# Patient Record
Sex: Male | Born: 1950 | Race: Black or African American | Hispanic: No | Marital: Married | Smoking: Never smoker
Health system: Southern US, Community
[De-identification: ages and names within clinical notes are randomized; demographics above are authoritative.]

## PROBLEM LIST (undated history)

## (undated) DIAGNOSIS — H269 Unspecified cataract: Secondary | ICD-10-CM

## (undated) DIAGNOSIS — E785 Hyperlipidemia, unspecified: Secondary | ICD-10-CM

## (undated) DIAGNOSIS — I428 Other cardiomyopathies: Secondary | ICD-10-CM

## (undated) DIAGNOSIS — T7840XA Allergy, unspecified, initial encounter: Secondary | ICD-10-CM

## (undated) DIAGNOSIS — I2510 Atherosclerotic heart disease of native coronary artery without angina pectoris: Secondary | ICD-10-CM

## (undated) DIAGNOSIS — I480 Paroxysmal atrial fibrillation: Secondary | ICD-10-CM

## (undated) DIAGNOSIS — R001 Bradycardia, unspecified: Secondary | ICD-10-CM

## (undated) DIAGNOSIS — I10 Essential (primary) hypertension: Secondary | ICD-10-CM

## (undated) DIAGNOSIS — I639 Cerebral infarction, unspecified: Secondary | ICD-10-CM

## (undated) DIAGNOSIS — R42 Dizziness and giddiness: Secondary | ICD-10-CM

## (undated) HISTORY — DX: Bradycardia, unspecified: R00.1

## (undated) HISTORY — DX: Other cardiomyopathies: I42.8

## (undated) HISTORY — DX: Allergy, unspecified, initial encounter: T78.40XA

## (undated) HISTORY — DX: Atherosclerotic heart disease of native coronary artery without angina pectoris: I25.10

## (undated) HISTORY — PX: ABDOMINAL SURGERY: SHX537

## (undated) HISTORY — PX: HERNIA REPAIR: SHX51

## (undated) HISTORY — PX: APPENDECTOMY: SHX54

## (undated) HISTORY — DX: Unspecified cataract: H26.9

## (undated) HISTORY — PX: EYE SURGERY: SHX253

## (undated) HISTORY — DX: Paroxysmal atrial fibrillation: I48.0

---

## 2003-06-09 ENCOUNTER — Emergency Department (HOSPITAL_COMMUNITY): Admission: EM | Admit: 2003-06-09 | Discharge: 2003-06-09 | Payer: Self-pay | Admitting: Emergency Medicine

## 2004-06-16 ENCOUNTER — Emergency Department (HOSPITAL_COMMUNITY): Admission: EM | Admit: 2004-06-16 | Discharge: 2004-06-16 | Payer: Self-pay | Admitting: Emergency Medicine

## 2005-05-04 ENCOUNTER — Emergency Department (HOSPITAL_COMMUNITY): Admission: EM | Admit: 2005-05-04 | Discharge: 2005-05-04 | Payer: Self-pay | Admitting: Emergency Medicine

## 2007-01-10 ENCOUNTER — Emergency Department (HOSPITAL_COMMUNITY): Admission: EM | Admit: 2007-01-10 | Discharge: 2007-01-10 | Payer: Self-pay | Admitting: Emergency Medicine

## 2007-08-26 ENCOUNTER — Ambulatory Visit: Payer: Self-pay | Admitting: Hematology & Oncology

## 2007-09-09 LAB — CBC WITH DIFFERENTIAL (CANCER CENTER ONLY)
BASO%: 0.6 % (ref 0.0–2.0)
EOS%: 3.7 % (ref 0.0–7.0)
Eosinophils Absolute: 0.1 10*3/uL (ref 0.0–0.5)
LYMPH%: 73 % — ABNORMAL HIGH (ref 14.0–48.0)
MCH: 31.3 pg (ref 28.0–33.4)
MCHC: 35.1 g/dL (ref 32.0–35.9)
MCV: 89 fL (ref 82–98)
MONO%: 6.1 % (ref 0.0–13.0)
NEUT#: 0.5 10*3/uL — ABNORMAL LOW (ref 1.5–6.5)
Platelets: 196 10*3/uL (ref 145–400)
RBC: 4.55 10*6/uL (ref 4.20–5.70)
RDW: 11.6 % (ref 10.5–14.6)

## 2007-09-09 LAB — CHCC SATELLITE - SMEAR

## 2007-09-10 LAB — IGG, IGA, IGM
IgA: 425 mg/dL — ABNORMAL HIGH (ref 68–378)
IgG (Immunoglobin G), Serum: 1950 mg/dL — ABNORMAL HIGH (ref 694–1618)

## 2007-09-11 LAB — RHEUMATOID FACTOR: Rhuematoid fact SerPl-aCnc: <20 IU/mL (ref 0–20)

## 2007-09-11 LAB — PROTEIN ELECTROPHORESIS, SERUM
Beta 2: 5.3 % (ref 3.2–6.5)
Gamma Globulin: 23.9 % — ABNORMAL HIGH (ref 11.1–18.8)

## 2007-09-11 LAB — VITAMIN B12: Vitamin B-12: 956 pg/mL — ABNORMAL HIGH (ref 211–911)

## 2007-09-27 ENCOUNTER — Encounter: Payer: Self-pay | Admitting: Emergency Medicine

## 2007-09-28 ENCOUNTER — Inpatient Hospital Stay (HOSPITAL_COMMUNITY): Admission: EM | Admit: 2007-09-28 | Discharge: 2007-10-01 | Payer: Self-pay | Admitting: Internal Medicine

## 2007-09-28 ENCOUNTER — Ambulatory Visit: Payer: Self-pay | Admitting: Cardiology

## 2007-09-30 ENCOUNTER — Encounter (INDEPENDENT_AMBULATORY_CARE_PROVIDER_SITE_OTHER): Payer: Self-pay | Admitting: Family Medicine

## 2007-10-01 ENCOUNTER — Encounter: Payer: Self-pay | Admitting: Cardiology

## 2007-10-15 ENCOUNTER — Ambulatory Visit: Payer: Self-pay | Admitting: Hematology & Oncology

## 2008-02-21 ENCOUNTER — Emergency Department (HOSPITAL_BASED_OUTPATIENT_CLINIC_OR_DEPARTMENT_OTHER): Admission: EM | Admit: 2008-02-21 | Discharge: 2008-02-21 | Payer: Self-pay | Admitting: Emergency Medicine

## 2008-02-21 ENCOUNTER — Ambulatory Visit: Payer: Self-pay | Admitting: Radiology

## 2009-10-13 ENCOUNTER — Ambulatory Visit: Payer: Self-pay | Admitting: Diagnostic Radiology

## 2009-10-13 ENCOUNTER — Encounter: Payer: Self-pay | Admitting: Emergency Medicine

## 2009-10-13 ENCOUNTER — Observation Stay (HOSPITAL_COMMUNITY): Admission: AD | Admit: 2009-10-13 | Discharge: 2009-10-14 | Payer: Self-pay | Admitting: Internal Medicine

## 2009-10-13 ENCOUNTER — Ambulatory Visit: Payer: Self-pay | Admitting: Cardiovascular Disease

## 2009-10-13 ENCOUNTER — Encounter (INDEPENDENT_AMBULATORY_CARE_PROVIDER_SITE_OTHER): Payer: Self-pay | Admitting: Internal Medicine

## 2010-03-24 LAB — DIFFERENTIAL
Basophils Absolute: 0 10*3/uL (ref 0.0–0.1)
Basophils Relative: 0 % (ref 0–1)
Eosinophils Absolute: 0.1 10*3/uL (ref 0.0–0.7)
Eosinophils Absolute: 0.1 10*3/uL (ref 0.0–0.7)
Lymphocytes Relative: 70 % — ABNORMAL HIGH (ref 12–46)
Monocytes Absolute: 0.2 10*3/uL (ref 0.1–1.0)
Neutro Abs: 0.9 10*3/uL — ABNORMAL LOW (ref 1.7–7.7)
Neutrophils Relative %: 15 % — ABNORMAL LOW (ref 43–77)
Neutrophils Relative %: 25 % — ABNORMAL LOW (ref 43–77)

## 2010-03-24 LAB — CBC
HCT: 42.6 % (ref 39.0–52.0)
Hemoglobin: 14.5 g/dL (ref 13.0–17.0)
MCHC: 32.6 g/dL (ref 30.0–36.0)
MCHC: 34.1 g/dL (ref 30.0–36.0)
MCV: 92.9 fL (ref 78.0–100.0)
Platelets: 179 10*3/uL (ref 150–400)
RDW: 12.8 % (ref 11.5–15.5)
RDW: 13.6 % (ref 11.5–15.5)

## 2010-03-24 LAB — CARDIAC PANEL(CRET KIN+CKTOT+MB+TROPI)
CK, MB: 1.8 ng/mL (ref 0.3–4.0)
Relative Index: 1.4 (ref 0.0–2.5)
Relative Index: 1.4 (ref 0.0–2.5)
Relative Index: 1.6 (ref 0.0–2.5)
Total CK: 133 U/L (ref 7–232)
Troponin I: 0.02 ng/mL (ref 0.00–0.06)
Troponin I: 0.03 ng/mL (ref 0.00–0.06)
Troponin I: 0.03 ng/mL (ref 0.00–0.06)

## 2010-03-24 LAB — COMPREHENSIVE METABOLIC PANEL
Albumin: 4.2 g/dL (ref 3.5–5.2)
Alkaline Phosphatase: 73 U/L (ref 39–117)
BUN: 10 mg/dL (ref 6–23)
CO2: 27 mEq/L (ref 19–32)
CO2: 29 mEq/L (ref 19–32)
Calcium: 9.6 mg/dL (ref 8.4–10.5)
Chloride: 101 mEq/L (ref 96–112)
Creatinine, Ser: 0.91 mg/dL (ref 0.4–1.5)
GFR calc Af Amer: >60 mL/min (ref >60)
GFR calc non Af Amer: >60 mL/min (ref >60)
GFR calc non Af Amer: >60 mL/min (ref >60)
Sodium: 141 mEq/L (ref 135–145)
Total Bilirubin: 1.1 mg/dL (ref 0.3–1.2)
Total Bilirubin: 1.4 mg/dL — ABNORMAL HIGH (ref 0.3–1.2)
Total Protein: 8.2 g/dL (ref 6.0–8.3)

## 2010-03-24 LAB — T4, FREE: Free T4: 1.01 ng/dL (ref 0.80–1.80)

## 2010-03-24 LAB — POCT CARDIAC MARKERS: Myoglobin, poc: 63.9 ng/mL (ref 12–200)

## 2010-03-24 LAB — D-DIMER, QUANTITATIVE: D-Dimer, Quant: 0.31 ug/mL-FEU (ref 0.00–0.48)

## 2010-05-24 NOTE — Discharge Summary (Signed)
Phillip Booth, Phillip Booth               ACCOUNT NO.:  [id number]   MEDICAL RECORD NO.:  [id number]          PATIENT TYPE:  INP   LOCATION:  3014                         FACILITY:  [facility]   PHYSICIAN:  Hillery Aldo, M.D.   DATE OF BIRTH:  [birth year]   DATE OF ADMISSION:  09/28/2007  DATE OF DISCHARGE:  10/01/2007                               DISCHARGE SUMMARY   PRIMARY CARE PHYSICIAN:  Dr. Maye Hides at [facility]   NEUROLOGIST:  Pramod P. Pearlean Brownie, MD   DISCHARGE DIAGNOSES:  1. 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  2. Sinus bradycardia.  3. Neutropenia.  4. Hypertension.  5. Hyperlipidemia.  6. Chronic sinusitis.   DISCHARGE MEDICATIONS:  1. Norvasc 10 mg daily.  2. Aspirin 325 mg daily.  3. Simvastatin 40 mg daily.  4. Hydrochlorothiazide 25 mg daily.   CONSULTATIONS:  1. Noel Christmas, MD of Neurology.  2. Marca Ancona, MD of [facility].   BRIEF ADMISSION HISTORY OF PRESENT ILLNESS:  The patient is a 60-year-  old male who presented with a chief complaint of left-sided weakness  persistent since the early morning hours.  He had a similar episode 3  days prior to presentation that resolved without any permanent sequelae  because of concerns for stroke, the patient was admitted for full stroke  evaluation.  For the full details, please see the dictated report done  by Dr. Flonnie Overman.   PROCEDURES AND DIAGNOSTIC STUDIES:  1. CT scan of the head on September 27, 2007, showed no acute      intracranial abnormality with chronic sinusitis.  2. MRI of the brain on September 29, 2007, showed acute/subacute      infarct involving the posterior limb of the right internal capsule      and right thalamus.  There were scattered periventricular      subcortical T2 hyperintensities rather bilaterally.  These were      nonspecific, but likely reflective of the sequelae of chronic      microvascular ischemia.  3. MRA of the head on September 29, 2007,  showed mild distal branch      vessel segmental irregularity of the MCA and distal PCA branches      compatible with some degree of intracranial atherosclerotic      disease.  No significant proximal stenosis, aneurysm, or branch      vessel occlusion.  4. MRA of the neck on September 29, 2007, showed normal findings.  5. Two-dimensional echocardiogram on September 30, 2007, showed normal      left ventricular systolic function with ejection fraction estimated      60-65%.  Left ventricular wall thickness was mildly increased.      There was mild aortic valvular regurgitation.  6. Transesophageal echocardiogram done on October 01, 2007, showed      no cardiac source for embolism.   DISCHARGE LABORATORY VALUES:  Chemistries were completely normal.  CBC  showed a white blood cell count of 3.3, hemoglobin 13.3, hematocrit 40,  platelets 168.   HOSPITAL COURSE BY PROBLEM:  1. Subcortical right-sided infarct secondary to  small vessel disease:      The patient was admitted and the stroke evaluation was performed.      The patient was seen in consultation with Neurology Service.  His      stroke was felt to be secondary to small vessel disease and      recommendations are to continue aspirin therapy daily and to follow      up with his primary care physician to ensure that his hypertension      and dyslipidemia are well controlled.  2. Sinus bradycardia:  The patient had heart rate sustained in the low      50s.  He was not on any rate controlling medications.  Because of      this, Cardiology consultation was requested and kindly provided by      Dr. Shirlee Latch of [facility].  The patient was asymptomatic      with no drop in his blood pressure and therefore no intervention      was deemed to be necessary.  Nodal blocking antihypertensive should      be avoided.  3. Neutropenia:  The patient had a low white blood cell count and a      pathology smear of his blood did reveal  neutropenia.  This is      without an apparent cause and the patient should have outpatient      followup with his PCP to monitor this.  4. Hypertension:  The patient was initiated on Norvasc therapy.      Hydrochlorothiazide has been added at discharge because his blood      pressure is still elevated at 150/95.  He should follow up with his      primary care physician to ensure his blood pressure control is      adequate over the next 1-2 weeks.   DISPOSITION:  The patient is medically stable for discharge.  We will  set up home health physical therapy and obtain a single-point cane to be  delivered to his room prior to discharge to help with ambulation.  He is  advised to follow up Dr. Pearlean Brownie in 2-3 months and to call for an  appointment.  He is advised to follow up with his primary care physician  in 1-2 weeks.  Dr. Jonny Ruiz should ensure that his blood pressure goals are  being met and followup on his fasting lipid panel in 6 weeks with a  check of his liver function studies to ensure his statin dose is  adequate for LDL control.   TIME SPENT TO COORDINATING CARE AND EXPLAINING DISCHARGE INSTRUCTIONS:  Equal 35 minutes.      Hillery Aldo, M.D.  Electronically Signed     CR/MEDQ  D:  10/01/2007  T:  10/01/2007  Job:  [id number]   cc:   Dr. Maye Hides  Pramod P. Pearlean Brownie, MD

## 2010-05-24 NOTE — Consult Note (Signed)
NAMEMarland Booth  SIDDHANT, HASHEMI NO.:  [id number]   MEDICAL RECORD NO.:  [id number]          PATIENT TYPE:  INP   LOCATION:  3014                         FACILITY:  [facility]   PHYSICIAN:  Noel Christmas, MD    DATE OF BIRTH:  07-08-1950   DATE OF CONSULTATION:  09/28/2007  DATE OF DISCHARGE:                                 CONSULTATION   REFERRING Sheehan Stacey:  Cherylann Parr B TEAM   REASON FOR CONSULTATION:  Acute cerebral infarction.   A 60 year old [place] American man who presented yesterday following  acute onset of weakness involving his left arm and left leg.  The  patient had difficulty walking on presentation as well as difficulty  with the use of his left upper extremity.  There was no facial weakness  or no speech abnormality.  He has also noticed dysarthria.  The patient  gave a history of a similar episode of weakness involving same  distribution about 1 week earlier which was acute in onset and lasted  about 45 minutes.  The patient has not been on antiplatelet therapy.  He  has no history of hypertension.  However, his blood pressure was  elevated on admission (164/96).  CT of his head showed no acute  intracranial abnormality.  He has experienced improvement in the  strength in his left arm, but no change in strength in his left leg.   PAST MEDICAL HISTORY:  Rather unremarkable.  He has no known systemic  medical illness prior to the presentation.  He took no medications on a  regular basis.   FAMILY HISTORY:  Remarkable for stroke involving one grandfather.  Neither of his parents had history of cerebrovascular disease.  His  mother had diabetes mellitus and heart failure.  His father died from  complications of emphysema.  The patient has no siblings.  He also has  no children.   PHYSICAL EXAMINATION:  GENERAL:  Appearance is that of a middle aged man  of medium built.  He was alert and cooperative in no acute distress.  He  was well oriented to time as well  as place.  Short-term and long-term  memory were normal.  Affect was appropriate.  Pupils were equal and  reacted normally to light.  Extraocular movements were full and  conjugate.  Visual fields were intact and normal.  There was no facial  numbness and no facial weakness.  Hearing was grossly normal.  Speech  and palate movement were normal.  Coordination was slightly impaired  involving the left upper extremity with finger-to-nose testing.  He had  left pronator drift as well.  Manual muscle testing showed normal  strength of both upper extremities, proximally and distally.  His left  lower extremity showed 2+/5 strength of his hip flexors and 3/5 strength  of his hamstrings.  He has 4+/5 strength of his quadriceps.  Strength  distally in his left lower extremity was normal.  Strength of his right  lower extremity was normal proximally and distally.  Deep tendon  reflexes were asymmetric with increased responses recorded from his left  upper and lower  extremities compared to the right.  Plantar response on  the left was equivocal and downgoing on the right.  Carotid auscultation  revealed no bruits.   CLINICAL IMPRESSION:  1. Acute, probable subcortical and possibly basal ganglia region      infarction with residual left lower extremity weakness and left      upper extremity coordination difficulty.  2. Transient ischemic attack, 1 week earlier involving same      distribution with complete resolution after 4-5 minutes.   RECOMMENDATIONS:  1. I agree with antiplatelet therapy with aspirin.  2. MRI and MRA is planned.  3. I also agree with physical therapy and occupational therapy      intervention.   Thank you for asking me to evaluate Mr. Peter.      Noel Christmas, MD  Electronically Signed     CS/MEDQ  D:  09/28/2007  T:  09/29/2007  Job:  [id number]

## 2010-05-24 NOTE — H&P (Signed)
NAME:  Phillip Booth, Phillip Booth NO.:  [id number]   MEDICAL RECORD NO.:  [id number]          PATIENT TYPE:  INP   LOCATION:  3014                         FACILITY:  [facility]   PHYSICIAN:  Lucita Ferrara, MD         DATE OF BIRTH:  01/09/1951   DATE OF ADMISSION:  09/28/2007  DATE OF DISCHARGE:                              HISTORY & PHYSICAL     The patient is a 60 year old male with chief complaint of left-sided  weakness that started earlier today.  The patient presented to [facility] where he had a full emergency room workup  and the determination was made to transfer the patient over here.  The  patient usually gets his care at [facility],  [facility].  He opted to come here to [facility].  As far as his  left-sided weakness goes it has been persistent since early in the  morning.  Apparently, he has had several concerns like this before,  several days ago which was about 3 days ago.  He denies any changes in  his speech, facial asymmetry, headaches, fevers, chills, neck pain.  He  denies any changes or alterations of his mental status.  His left upper  extremity weakness has now subsided.   PAST MEDICAL HISTORY:  Significant for he no past medical history.  He  denies a history of diabetes or hypertension.   FAMILY HISTORY:  Significant for diabetes or hypertension.  Negative for  cerebrovascular accident.   PAST SURGICAL HISTORY:  Status post appendectomy.   SOCIAL HISTORY:  He denies any drugs, alcohol or tobacco.  He is  married.   ALLERGIES:  NO KNOWN DRUG ALLERGIES.   MEDICATIONS:  None.   REVIEW OF SYSTEMS:  As per HPI, otherwise negative.  He denies any  cough, shortness of breath, chest pain, nausea, vomiting, rashes, recent  travel.   PHYSICAL EXAMINATION:  Generally speaking, the patient is in no acute  distress.  The patient's blood pressure is 164/96, pulse 64, respirations 20,  temperature 97.9.  HEENT:  Normocephalic, atraumatic.  Sclerae anicteric.  NECK:  Supple.  No JVD, no carotid bruits.  PERLA.  Extraocular muscles  are intact.  CARDIOVASCULAR:  S1 and S2, regular rate and rhythm, no murmurs, rubs or  clicks.  LUNGS: Clear to auscultation bilaterally.  No rhonchi, rales or wheezes.  ABDOMEN: Soft, nontender, nondistended.  Positive bowel sounds.  NEURO:  Patient is alert and oriented x3.  Cranial nerves II-XII are  grossly intact.  Normal speech.  No nystagmus.  No facial droop.  Strength is 5/5 in both upper and lower extremities bilaterally.  Reflexes 2+ bilaterally upper and lower extremities.  Pulses 2+  bilaterally upper and lower extremities.   EKG rate of 49, normal sinus rhythm, nonspecific ST-T wave changes.  Otherwise negative.  CBC within normal limits.  CT scan of the head  shows chronic sinusitis, otherwise normal.  INR 1.  Complete metabolic  panel within normal limits.  AST, ALT and alk-phos 48, 54, and alk-phos  57,  respectively.   ASSESSMENT:  A 60 year old with:  1. Left-sided weakness in arms and legs.  No facial droop.  No speech      deficits.  No headaches.  So far the left upper extremity weakness      has resolved.  2. Minimal risk factors, but blood pressures seem to be high.  I      question undiagnosed hypertension.   DISCUSSION AND PLAN:  We will go ahead and admit the patient to the  medical telemetry unit.  Rule out CVA versus TIA.  MRI, MRA of the  brain, bilateral carotid Doppler, aspirin, ESR, fasting lipid profile,  RPR, bedside swallow eval prior to any p.o.Marland Kitchen hypertension---We will  slowly bring the blood pressures down--slowely and permissive blood  pressures, until CVA ruledout. Neurology consultation as needed.  Neuro  checks q.4h.  DVT and GI prophylaxis with Lovenox and Protonix.  The  rest of plans will depend on his progress and consultant  recommendations.      Lucita Ferrara, MD  Electronically Signed     RR/MEDQ  D:   09/28/2007  T:  09/28/2007  Job:  [id number]

## 2010-09-28 LAB — POCT URINALYSIS DIP (DEVICE)
Glucose, UA: NEGATIVE
Nitrite: NEGATIVE
Operator id: 239701
Protein, ur: 100 — AB
Specific Gravity, Urine: >=1.03
Urobilinogen, UA: 1

## 2010-10-10 LAB — URINALYSIS, ROUTINE W REFLEX MICROSCOPIC
Hgb urine dipstick: NEGATIVE
Specific Gravity, Urine: 1.03
Urobilinogen, UA: 0.2
pH: 5.5

## 2010-10-10 LAB — LIPID PANEL
HDL: 40
HDL: 42
LDL Cholesterol: 153 — ABNORMAL HIGH
Total CHOL/HDL Ratio: 5
Total CHOL/HDL Ratio: 5.7
Triglycerides: 172 — ABNORMAL HIGH
VLDL: 34

## 2010-10-10 LAB — COMPREHENSIVE METABOLIC PANEL
ALT: 54 — ABNORMAL HIGH
AST: 48 — ABNORMAL HIGH
Albumin: 3.4 — ABNORMAL LOW
Albumin: 3.9
Alkaline Phosphatase: 53
Alkaline Phosphatase: 67
BUN: 12
BUN: 14
CO2: 25
Chloride: 104
Chloride: 108
GFR calc Af Amer: >60
GFR calc non Af Amer: >60
Glucose, Bld: 94
Potassium: 3.8
Potassium: 4
Sodium: 141
Total Bilirubin: 0.9
Total Bilirubin: 0.7

## 2010-10-10 LAB — DRUGS OF ABUSE SCREEN W/O ALC, ROUTINE URINE
Amphetamine Screen, Ur: NEGATIVE
Creatinine,U: 228
Marijuana Metabolite: NEGATIVE
Methadone: NEGATIVE
Opiate Screen, Urine: NEGATIVE
Propoxyphene: NEGATIVE

## 2010-10-10 LAB — BASIC METABOLIC PANEL
BUN: 10
BUN: 9
Chloride: 103
Chloride: 105
Creatinine, Ser: 1
Glucose, Bld: 96
Potassium: 4.3

## 2010-10-10 LAB — CARDIAC PANEL(CRET KIN+CKTOT+MB+TROPI)
CK, MB: 2
Total CK: 185
Total CK: 191
Troponin I: 0.01

## 2010-10-10 LAB — CBC
HCT: 41.3
HCT: 40.6
MCV: 93.5
MCV: 94.1
Platelets: 154
Platelets: 168
Platelets: 162
RDW: 13.5
RDW: 13.7
WBC: 3.3 — ABNORMAL LOW
WBC: 3.4 — ABNORMAL LOW
WBC: 4.2

## 2010-10-10 LAB — DIFFERENTIAL
Basophils Absolute: 0
Basophils Relative: 1
Eosinophils Absolute: 0.1
Lymphs Abs: 2.7
Monocytes Absolute: 0.5
Neutro Abs: 0.9 — ABNORMAL LOW

## 2010-10-10 LAB — PATHOLOGIST SMEAR REVIEW

## 2010-10-10 LAB — PROTIME-INR
INR: 1
Prothrombin Time: 13.8

## 2010-10-10 LAB — APTT
aPTT: 29
aPTT: 28

## 2010-10-10 LAB — MAGNESIUM: Magnesium: 2.2

## 2010-10-10 LAB — TSH: TSH: 1.782

## 2010-10-10 LAB — SEDIMENTATION RATE: Sed Rate: 15

## 2011-01-31 ENCOUNTER — Encounter (HOSPITAL_BASED_OUTPATIENT_CLINIC_OR_DEPARTMENT_OTHER): Payer: Self-pay

## 2011-01-31 ENCOUNTER — Other Ambulatory Visit: Payer: Self-pay

## 2011-01-31 ENCOUNTER — Emergency Department (HOSPITAL_BASED_OUTPATIENT_CLINIC_OR_DEPARTMENT_OTHER)
Admission: EM | Admit: 2011-01-31 | Discharge: 2011-01-31 | Disposition: A | Discharge status: Home / Self Care | Payer: Self-pay | Attending: Emergency Medicine | Admitting: Emergency Medicine

## 2011-01-31 DIAGNOSIS — Z9114 Patient's other noncompliance with medication regimen: Secondary | ICD-10-CM

## 2011-01-31 DIAGNOSIS — H8110 Benign paroxysmal vertigo, unspecified ear: Secondary | ICD-10-CM | POA: Insufficient documentation

## 2011-01-31 DIAGNOSIS — Z9119 Patient's noncompliance with other medical treatment and regimen: Secondary | ICD-10-CM | POA: Insufficient documentation

## 2011-01-31 DIAGNOSIS — H6120 Impacted cerumen, unspecified ear: Secondary | ICD-10-CM | POA: Insufficient documentation

## 2011-01-31 DIAGNOSIS — Z91199 Patient's noncompliance with other medical treatment and regimen due to unspecified reason: Secondary | ICD-10-CM | POA: Insufficient documentation

## 2011-01-31 DIAGNOSIS — Z8679 Personal history of other diseases of the circulatory system: Secondary | ICD-10-CM | POA: Insufficient documentation

## 2011-01-31 DIAGNOSIS — E785 Hyperlipidemia, unspecified: Secondary | ICD-10-CM | POA: Insufficient documentation

## 2011-01-31 DIAGNOSIS — Z79899 Other long term (current) drug therapy: Secondary | ICD-10-CM | POA: Insufficient documentation

## 2011-01-31 DIAGNOSIS — I10 Essential (primary) hypertension: Secondary | ICD-10-CM | POA: Insufficient documentation

## 2011-01-31 HISTORY — DX: Hyperlipidemia, unspecified: E78.5

## 2011-01-31 HISTORY — DX: Cerebral infarction, unspecified: I63.9

## 2011-01-31 HISTORY — DX: Essential (primary) hypertension: I10

## 2011-01-31 LAB — BASIC METABOLIC PANEL
CO2: 26 mEq/L (ref 19–32)
GFR calc non Af Amer: 70 mL/min — ABNORMAL LOW (ref >90)
Glucose, Bld: 103 mg/dL — ABNORMAL HIGH (ref 70–99)
Potassium: 3.8 mEq/L (ref 3.5–5.1)
Sodium: 139 mEq/L (ref 135–145)

## 2011-01-31 MED ORDER — MECLIZINE HCL 25 MG PO TABS
25.0000 mg | ORAL_TABLET | Freq: Once | ORAL | Status: AC
  Administered 2011-01-31: 25 mg via ORAL
  Filled 2011-01-31: qty 1

## 2011-01-31 MED ORDER — ROSUVASTATIN CALCIUM 10 MG PO TABS: 10.0000 mg | ORAL_TABLET | Freq: Every day | ORAL | Status: DC

## 2011-01-31 MED ORDER — MECLIZINE HCL 25 MG PO TABS: 25.0000 mg | ORAL_TABLET | Freq: Three times a day (TID) | ORAL | Status: AC | PRN

## 2011-01-31 MED ORDER — LISINOPRIL 10 MG PO TABS: 10.0000 mg | ORAL_TABLET | Freq: Every day | ORAL | Status: DC

## 2011-01-31 NOTE — ED Notes (Signed)
Blood collected and sent to lab.  Pt informed of plan of care.  Pt ambulated without difficulty.

## 2011-01-31 NOTE — ED Notes (Signed)
MD at bedside. 

## 2011-01-31 NOTE — ED Provider Notes (Signed)
History     CSN: [id number]  Arrival date & time 01/31/11  4098   First MD Initiated Contact with Patient 01/31/11 [phone]      Chief Complaint  Patient presents with  . Dizziness  . Hypertension    (Consider location/radiation/quality/duration/timing/severity/associated sxs/prior treatment) HPI Comments: Patient states she's not been taking his lisinopril as prescribed. Has been out for a few days. Also states she's had a URI for the past 1-2 days. Congestion and runny nose. Mild cough. Has also noticed some mild vertiginous symptoms. Nausea but no vomiting. Has had similar symptoms in the past. Denies weakness, paresthesias, headache.  Patient is a 61 y.o. male presenting with hypertension. The history is provided by the patient. No language interpreter was used.  Hypertension This is a recurrent problem. The current episode started yesterday. The problem occurs constantly. The problem has been gradually worsening. Pertinent negatives include no chest pain, no abdominal pain, no headaches and no shortness of breath. The symptoms are aggravated by nothing. The symptoms are relieved by nothing. He has tried nothing for the symptoms. The treatment provided no relief.    Past Medical History  Diagnosis Date  . CVA (cerebral infarction)   . Hypertension   . Hyperlipidemia     Past Surgical History  Procedure Date  . Abdominal surgery     No family history on file.  History  Substance Use Topics  . Smoking status: Never Smoker   . Smokeless tobacco: Never Used  . Alcohol Use: Yes     occasional      Review of Systems  Constitutional: Negative for fever, chills and fatigue.  HENT: Positive for congestion and rhinorrhea. Negative for sore throat, neck pain and neck stiffness.   Respiratory: Negative for cough and shortness of breath.   Cardiovascular: Negative for chest pain and palpitations.  Gastrointestinal: Negative for nausea, vomiting, abdominal pain and diarrhea.    Genitourinary: Negative for dysuria, urgency, frequency and flank pain.  Musculoskeletal: Negative for myalgias, back pain and arthralgias.  Neurological: Positive for dizziness. Negative for weakness, light-headedness, numbness and headaches.  All other systems reviewed and are negative.    Allergies  Review of patient's allergies indicates not on file.  Home Medications   Current Outpatient Rx  Name Route Sig Dispense Refill  . ASPIRIN 81 MG PO TABS Oral Take 160 mg by mouth daily.    Marland Kitchen LISINOPRIL 10 MG PO TABS Oral Take by mouth daily.    Marland Kitchen ROSUVASTATIN CALCIUM 10 MG PO TABS Oral Take by mouth daily.    Marland Kitchen LISINOPRIL 10 MG PO TABS Oral Take 1 tablet (10 mg total) by mouth daily. 30 tablet 0  . MECLIZINE HCL 25 MG PO TABS Oral Take 1 tablet (25 mg total) by mouth 3 (three) times daily as needed. 30 tablet 0  . ROSUVASTATIN CALCIUM 10 MG PO TABS Oral Take 1 tablet (10 mg total) by mouth daily. 30 tablet 0    BP 160/85  Pulse 49  Temp(Src) 98.2 F (36.8 C) (Oral)  Resp 18  Ht 6\' 4"  (1.93 m)  Wt 215 lb (97.523 kg)  BMI 26.17 kg/m2  SpO2 100%  Physical Exam  Nursing note and vitals reviewed. Constitutional: He is oriented to person, place, and time. He appears well-developed and well-nourished. No distress.  HENT:  Head: Normocephalic and atraumatic.  Mouth/Throat: Oropharynx is clear and moist.       Bilateral cerumen impaction  Eyes: Conjunctivae and EOM are normal. Pupils are  equal, round, and reactive to light.  Neck: Normal range of motion. Neck supple.  Cardiovascular: Regular rhythm, normal heart sounds and intact distal pulses.  Exam reveals no gallop and no friction rub.   No murmur heard.      Bradycardic rate  Pulmonary/Chest: Effort normal and breath sounds normal. No respiratory distress.  Abdominal: Soft. Bowel sounds are normal. There is no tenderness.  Musculoskeletal: Normal range of motion. He exhibits no tenderness.  Lymphadenopathy:    He has no  cervical adenopathy.  Neurological: He is alert and oriented to person, place, and time. He has normal strength and normal reflexes. No cranial nerve deficit or sensory deficit. He displays a negative Romberg sign. Coordination and gait normal.  Skin: Skin is warm and dry. No rash noted.    ED Course  Procedures (including critical care time)   Date: 01/31/2011  Rate: 45  Rhythm: sinus bradycardia  QRS Axis: normal  Intervals: PR prolonged  ST/T Wave abnormalities: normal  Conduction Disutrbances:first-degree A-V block   Narrative Interpretation:   Old EKG Reviewed: unchanged  Labs Reviewed  BASIC METABOLIC PANEL - Abnormal; Notable for the following:    Glucose, Bld 103 (*)    GFR calc non Af Amer 70 (*)    GFR calc Af Amer 81 (*)    All other components within normal limits   No results found.   1. Hypertension   2. Noncompliance with medications   3. Benign paroxysmal vertigo       MDM  Patient has been noncompliant with his medications. Cerumen impaction resolved with flushing. This also improved his vertigo. I have no concern about a malignant cause of vertigo such as posterior circulation involvement as it is worse with certain head movements. However this is consistent with benign positional vertigo. He is provided structure is for which to return. He be prescribed 30 day prescriptions of his lisinopril and Crestor.        Dayton Bailiff, MD 01/31/11 [phone]

## 2011-01-31 NOTE — ED Notes (Signed)
Pt reports relief after ear wax removal.

## 2011-01-31 NOTE — ED Notes (Signed)
Pt reports dizziness and high blood pressure that started yesterday.

## 2012-01-17 ENCOUNTER — Emergency Department (HOSPITAL_BASED_OUTPATIENT_CLINIC_OR_DEPARTMENT_OTHER)
Admission: EM | Admit: 2012-01-17 | Discharge: 2012-01-17 | Disposition: A | Discharge status: Home / Self Care | Payer: Self-pay | Attending: Emergency Medicine | Admitting: Emergency Medicine

## 2012-01-17 ENCOUNTER — Encounter (HOSPITAL_BASED_OUTPATIENT_CLINIC_OR_DEPARTMENT_OTHER): Payer: Self-pay | Admitting: *Deleted

## 2012-01-17 DIAGNOSIS — I10 Essential (primary) hypertension: Secondary | ICD-10-CM | POA: Insufficient documentation

## 2012-01-17 DIAGNOSIS — Z7982 Long term (current) use of aspirin: Secondary | ICD-10-CM | POA: Insufficient documentation

## 2012-01-17 DIAGNOSIS — E785 Hyperlipidemia, unspecified: Secondary | ICD-10-CM | POA: Insufficient documentation

## 2012-01-17 DIAGNOSIS — Z79899 Other long term (current) drug therapy: Secondary | ICD-10-CM | POA: Insufficient documentation

## 2012-01-17 DIAGNOSIS — R112 Nausea with vomiting, unspecified: Secondary | ICD-10-CM

## 2012-01-17 DIAGNOSIS — Z8673 Personal history of transient ischemic attack (TIA), and cerebral infarction without residual deficits: Secondary | ICD-10-CM | POA: Insufficient documentation

## 2012-01-17 LAB — COMPREHENSIVE METABOLIC PANEL
AST: 41 U/L — ABNORMAL HIGH (ref 0–37)
Albumin: 3.8 g/dL (ref 3.5–5.2)
Calcium: 10 mg/dL (ref 8.4–10.5)
Creatinine, Ser: 0.9 mg/dL (ref 0.50–1.35)
Sodium: 138 mEq/L (ref 135–145)
Total Protein: 8 g/dL (ref 6.0–8.3)

## 2012-01-17 LAB — URINALYSIS, ROUTINE W REFLEX MICROSCOPIC
Glucose, UA: NEGATIVE mg/dL
Leukocytes, UA: NEGATIVE
Protein, ur: 100 mg/dL — AB
Specific Gravity, Urine: 1.025 (ref 1.005–1.030)
pH: 5.5 (ref 5.0–8.0)

## 2012-01-17 LAB — CBC WITH DIFFERENTIAL/PLATELET
Basophils Relative: 0 % (ref 0–1)
Eosinophils Relative: 2 % (ref 0–5)
Hemoglobin: 15.3 g/dL (ref 13.0–17.0)
Lymphocytes Relative: 57 % — ABNORMAL HIGH (ref 12–46)
MCH: 30.7 pg (ref 26.0–34.0)
Monocytes Absolute: 0.3 10*3/uL (ref 0.1–1.0)
Monocytes Relative: 10 % (ref 3–12)
Neutrophils Relative %: 31 % — ABNORMAL LOW (ref 43–77)
RBC: 4.99 MIL/uL (ref 4.22–5.81)
WBC: 3.2 10*3/uL — ABNORMAL LOW (ref 4.0–10.5)

## 2012-01-17 LAB — URINE MICROSCOPIC-ADD ON

## 2012-01-17 MED ORDER — ONDANSETRON HCL 4 MG/2ML IJ SOLN
4.0000 mg | Freq: Once | INTRAMUSCULAR | Status: AC
  Administered 2012-01-17: 4 mg via INTRAVENOUS
  Filled 2012-01-17: qty 2

## 2012-01-17 MED ORDER — ROSUVASTATIN CALCIUM 10 MG PO TABS: 10.0000 mg | ORAL_TABLET | Freq: Every day | ORAL | Status: DC

## 2012-01-17 MED ORDER — LISINOPRIL 10 MG PO TABS: 10.0000 mg | ORAL_TABLET | Freq: Every day | ORAL | Status: DC

## 2012-01-17 MED ORDER — METOCLOPRAMIDE HCL 10 MG PO TABS: 10.0000 mg | ORAL_TABLET | Freq: Four times a day (QID) | ORAL | Status: DC

## 2012-01-17 MED ORDER — SODIUM CHLORIDE 0.9 % IV SOLN
1000.0000 mL | Freq: Once | INTRAVENOUS | Status: AC
  Administered 2012-01-17: 1000 mL via INTRAVENOUS

## 2012-01-17 NOTE — ED Notes (Signed)
MD at bedside. 

## 2012-01-17 NOTE — ED Notes (Signed)
Pt c/o n/v x 1 day with dizziness

## 2012-01-17 NOTE — ED Provider Notes (Signed)
History     CSN: [id number]  Arrival date & time 01/17/12  1710   First MD Initiated Contact with Patient 01/17/12 1724      Chief Complaint  Patient presents with  . Emesis    (Consider location/radiation/quality/duration/timing/severity/associated sxs/prior treatment) HPI The patient presents with concerns of one day of nausea, generalized discomfort.  He notes that over the past 24 hours the patient has had increasing nausea, with an unsettled sensation in his stomach.  The symptoms were mild yesterday, and the patient was able to sleep normally.  Upon awakening, and throughout the day his symptoms have progressed.  Just prior to arrival the patient had one episode of emesis, with substantial reduction in his nausea, generalized sense of discomfort. He denies focal pain either abdominal or chest pain throughout the day.  He denies confusion, disorientation, ataxia, dysarthria or dysphagia. He has not taken anything for symptom control. The patient has been inconsistently taking his antihypertensives, for some time.  Past Medical History  Diagnosis Date  . CVA (cerebral infarction)   . Hypertension   . Hyperlipidemia     Past Surgical History  Procedure Date  . Abdominal surgery     History reviewed. No pertinent family history.  History  Substance Use Topics  . Smoking status: Never Smoker   . Smokeless tobacco: Never Used  . Alcohol Use: Yes     Comment: occasional      Review of Systems  Constitutional:       Per HPI, otherwise negative  HENT:       Per HPI, otherwise negative  Eyes: Negative.   Respiratory:       Per HPI, otherwise negative  Cardiovascular:       Per HPI, otherwise negative  Gastrointestinal: Positive for nausea and vomiting. Negative for diarrhea and constipation.  Genitourinary: Negative.   Musculoskeletal:       Per HPI, otherwise negative  Skin: Negative.   Neurological: Negative for syncope.    Allergies  Review of patient's  allergies indicates no known allergies.  Home Medications   Current Outpatient Rx  Name  Route  Sig  Dispense  Refill  . ASPIRIN 81 MG PO TABS   Oral   Take 160 mg by mouth daily.         Marland Kitchen LISINOPRIL 10 MG PO TABS   Oral   Take by mouth daily.         Marland Kitchen LISINOPRIL 10 MG PO TABS   Oral   Take 1 tablet (10 mg total) by mouth daily.   30 tablet   0   . ROSUVASTATIN CALCIUM 10 MG PO TABS   Oral   Take by mouth daily.         Marland Kitchen ROSUVASTATIN CALCIUM 10 MG PO TABS   Oral   Take 1 tablet (10 mg total) by mouth daily.   30 tablet   0     BP 189/104  Pulse 72  Temp 97.7 F (36.5 C) (Oral)  Resp 16  Ht 6\' 4"  (1.93 m)  Wt 218 lb (98.884 kg)  BMI 26.54 kg/m2  SpO2 100%  Physical Exam  Nursing note and vitals reviewed. Constitutional: He is oriented to person, place, and time. He appears well-developed. No distress.  HENT:  Head: Normocephalic and atraumatic.  Eyes: Conjunctivae normal and EOM are normal.  Cardiovascular: Normal rate and regular rhythm.   Pulmonary/Chest: Effort normal. No stridor. No respiratory distress.  Abdominal: Soft. Bowel sounds are  normal. He exhibits no distension and no mass. There is no tenderness. There is no rebound and no guarding.  Musculoskeletal: He exhibits no edema.  Neurological: He is alert and oriented to person, place, and time.  Skin: Skin is warm and dry.  Psychiatric: He has a normal mood and affect.    ED Course  Procedures (including critical care time)  Labs Reviewed  CBC WITH DIFFERENTIAL - Abnormal; Notable for the following:    WBC 3.2 (*)     All other components within normal limits  COMPREHENSIVE METABOLIC PANEL  LIPASE, BLOOD  URINALYSIS, ROUTINE W REFLEX MICROSCOPIC   No results found.   No diagnosis found.  Cardiac 65 sinus rhythm normal Pulse ox 99% room air normal   Date: 01/17/2012  Rate: 59  Rhythm: normal sinus rhythm  QRS Axis: normal  Intervals: normal  ST/T Wave abnormalities: ST  depressions diffusely  Conduction Disutrbances:first-degree A-V block   Narrative Interpretation:   Old EKG Reviewed: unchanged ABNORMAL - no sig changes  Immediately after the initial evaluation, the patient's blood pressure elevation, he had EKG, labs urinalysis sent  6:42 PM Patient we examined.  He is in no distress.  Vital signs remained unremarkable.  The patient denies any ongoing pain, nausea, any complaints. MDM  This patient presents with one day of nausea, and by the time of exam has vomited, and improved substantially.  On exam the patient is in no distress with a soft abdomen.  His mother dyspneic, nor hypoxic.  The patient has a history of hypertension and hyperlipidemia, the absence of ongoing complaints, is unchanged ECG are reassuring for the low suspicion of acute coronary ischemia.  The absence of fever, cough, is reassuring for low suspicion respiratory pathology.  The patient's description of progressive nausea with eventual emesis there suspicion of GI pathology.  We discussed the need for PMD followup, the patient was discharged in stable condition with a refill of his prescription, per his request.    Gerhard Munch, MD 01/17/12 1843

## 2013-10-31 DIAGNOSIS — J302 Other seasonal allergic rhinitis: Secondary | ICD-10-CM | POA: Diagnosis not present

## 2013-10-31 DIAGNOSIS — N401 Enlarged prostate with lower urinary tract symptoms: Secondary | ICD-10-CM | POA: Diagnosis not present

## 2013-10-31 DIAGNOSIS — E559 Vitamin D deficiency, unspecified: Secondary | ICD-10-CM | POA: Diagnosis not present

## 2013-10-31 DIAGNOSIS — F5221 Male erectile disorder: Secondary | ICD-10-CM | POA: Diagnosis not present

## 2013-10-31 DIAGNOSIS — I10 Essential (primary) hypertension: Secondary | ICD-10-CM | POA: Diagnosis not present

## 2013-10-31 DIAGNOSIS — Z0000 Encounter for general adult medical examination without abnormal findings: Secondary | ICD-10-CM | POA: Diagnosis not present

## 2013-10-31 DIAGNOSIS — E785 Hyperlipidemia, unspecified: Secondary | ICD-10-CM | POA: Diagnosis not present

## 2013-10-31 DIAGNOSIS — Z23 Encounter for immunization: Secondary | ICD-10-CM | POA: Diagnosis not present

## 2013-11-03 DIAGNOSIS — H2510 Age-related nuclear cataract, unspecified eye: Secondary | ICD-10-CM | POA: Diagnosis not present

## 2013-11-04 DIAGNOSIS — I10 Essential (primary) hypertension: Secondary | ICD-10-CM | POA: Diagnosis not present

## 2013-11-04 DIAGNOSIS — E559 Vitamin D deficiency, unspecified: Secondary | ICD-10-CM | POA: Diagnosis not present

## 2013-11-04 DIAGNOSIS — E785 Hyperlipidemia, unspecified: Secondary | ICD-10-CM | POA: Diagnosis not present

## 2013-11-04 DIAGNOSIS — Z125 Encounter for screening for malignant neoplasm of prostate: Secondary | ICD-10-CM | POA: Diagnosis not present

## 2013-11-10 DIAGNOSIS — M7752 Other enthesopathy of left foot: Secondary | ICD-10-CM | POA: Diagnosis not present

## 2013-11-10 DIAGNOSIS — M722 Plantar fascial fibromatosis: Secondary | ICD-10-CM | POA: Diagnosis not present

## 2013-11-27 DIAGNOSIS — M79672 Pain in left foot: Secondary | ICD-10-CM | POA: Diagnosis not present

## 2013-11-27 DIAGNOSIS — M722 Plantar fascial fibromatosis: Secondary | ICD-10-CM | POA: Diagnosis not present

## 2013-12-29 DIAGNOSIS — H11433 Conjunctival hyperemia, bilateral: Secondary | ICD-10-CM | POA: Diagnosis not present

## 2013-12-29 DIAGNOSIS — D485 Neoplasm of uncertain behavior of skin: Secondary | ICD-10-CM | POA: Diagnosis not present

## 2014-02-27 DIAGNOSIS — H18411 Arcus senilis, right eye: Secondary | ICD-10-CM | POA: Diagnosis not present

## 2014-02-27 DIAGNOSIS — H18412 Arcus senilis, left eye: Secondary | ICD-10-CM | POA: Diagnosis not present

## 2014-02-27 DIAGNOSIS — H2511 Age-related nuclear cataract, right eye: Secondary | ICD-10-CM | POA: Diagnosis not present

## 2014-02-27 DIAGNOSIS — H2512 Age-related nuclear cataract, left eye: Secondary | ICD-10-CM | POA: Diagnosis not present

## 2014-03-02 DIAGNOSIS — D2212 Melanocytic nevi of left eyelid, including canthus: Secondary | ICD-10-CM | POA: Diagnosis not present

## 2014-03-02 DIAGNOSIS — D485 Neoplasm of uncertain behavior of skin: Secondary | ICD-10-CM | POA: Diagnosis not present

## 2014-03-17 ENCOUNTER — Other Ambulatory Visit: Payer: Self-pay

## 2014-03-17 ENCOUNTER — Encounter (HOSPITAL_BASED_OUTPATIENT_CLINIC_OR_DEPARTMENT_OTHER): Payer: Self-pay

## 2014-03-17 ENCOUNTER — Inpatient Hospital Stay (HOSPITAL_BASED_OUTPATIENT_CLINIC_OR_DEPARTMENT_OTHER)
Admission: EM | Admit: 2014-03-17 | Discharge: 2014-03-20 | DRG: 243 | Disposition: A | Discharge status: Home / Self Care | Payer: Medicare Other | Attending: Internal Medicine | Admitting: Internal Medicine

## 2014-03-17 ENCOUNTER — Emergency Department (HOSPITAL_BASED_OUTPATIENT_CLINIC_OR_DEPARTMENT_OTHER): Payer: Medicare Other

## 2014-03-17 ENCOUNTER — Inpatient Hospital Stay (HOSPITAL_BASED_OUTPATIENT_CLINIC_OR_DEPARTMENT_OTHER): Payer: Medicare Other

## 2014-03-17 ENCOUNTER — Other Ambulatory Visit (HOSPITAL_BASED_OUTPATIENT_CLINIC_OR_DEPARTMENT_OTHER): Payer: Self-pay

## 2014-03-17 DIAGNOSIS — R059 Cough, unspecified: Secondary | ICD-10-CM

## 2014-03-17 DIAGNOSIS — R7989 Other specified abnormal findings of blood chemistry: Secondary | ICD-10-CM | POA: Diagnosis not present

## 2014-03-17 DIAGNOSIS — Z9114 Patient's other noncompliance with medication regimen: Secondary | ICD-10-CM | POA: Diagnosis present

## 2014-03-17 DIAGNOSIS — R011 Cardiac murmur, unspecified: Secondary | ICD-10-CM | POA: Diagnosis present

## 2014-03-17 DIAGNOSIS — Z79899 Other long term (current) drug therapy: Secondary | ICD-10-CM | POA: Diagnosis not present

## 2014-03-17 DIAGNOSIS — I441 Atrioventricular block, second degree: Secondary | ICD-10-CM | POA: Diagnosis not present

## 2014-03-17 DIAGNOSIS — R0602 Shortness of breath: Secondary | ICD-10-CM | POA: Diagnosis not present

## 2014-03-17 DIAGNOSIS — R918 Other nonspecific abnormal finding of lung field: Secondary | ICD-10-CM | POA: Diagnosis not present

## 2014-03-17 DIAGNOSIS — Z8249 Family history of ischemic heart disease and other diseases of the circulatory system: Secondary | ICD-10-CM

## 2014-03-17 DIAGNOSIS — Z7982 Long term (current) use of aspirin: Secondary | ICD-10-CM

## 2014-03-17 DIAGNOSIS — Z8673 Personal history of transient ischemic attack (TIA), and cerebral infarction without residual deficits: Secondary | ICD-10-CM

## 2014-03-17 DIAGNOSIS — I2510 Atherosclerotic heart disease of native coronary artery without angina pectoris: Secondary | ICD-10-CM | POA: Diagnosis present

## 2014-03-17 DIAGNOSIS — I255 Ischemic cardiomyopathy: Secondary | ICD-10-CM | POA: Diagnosis not present

## 2014-03-17 DIAGNOSIS — T464X6A Underdosing of angiotensin-converting-enzyme inhibitors, initial encounter: Secondary | ICD-10-CM | POA: Diagnosis present

## 2014-03-17 DIAGNOSIS — J90 Pleural effusion, not elsewhere classified: Secondary | ICD-10-CM | POA: Diagnosis not present

## 2014-03-17 DIAGNOSIS — I351 Nonrheumatic aortic (valve) insufficiency: Secondary | ICD-10-CM | POA: Diagnosis not present

## 2014-03-17 DIAGNOSIS — I110 Hypertensive heart disease with heart failure: Secondary | ICD-10-CM | POA: Diagnosis present

## 2014-03-17 DIAGNOSIS — I5022 Chronic systolic (congestive) heart failure: Secondary | ICD-10-CM | POA: Diagnosis not present

## 2014-03-17 DIAGNOSIS — E785 Hyperlipidemia, unspecified: Secondary | ICD-10-CM | POA: Diagnosis not present

## 2014-03-17 DIAGNOSIS — I10 Essential (primary) hypertension: Secondary | ICD-10-CM | POA: Diagnosis not present

## 2014-03-17 DIAGNOSIS — I5042 Chronic combined systolic (congestive) and diastolic (congestive) heart failure: Secondary | ICD-10-CM | POA: Diagnosis not present

## 2014-03-17 DIAGNOSIS — R001 Bradycardia, unspecified: Secondary | ICD-10-CM | POA: Diagnosis not present

## 2014-03-17 DIAGNOSIS — R05 Cough: Secondary | ICD-10-CM | POA: Diagnosis present

## 2014-03-17 DIAGNOSIS — I161 Hypertensive emergency: Secondary | ICD-10-CM | POA: Diagnosis present

## 2014-03-17 DIAGNOSIS — I214 Non-ST elevation (NSTEMI) myocardial infarction: Secondary | ICD-10-CM | POA: Diagnosis present

## 2014-03-17 DIAGNOSIS — Z959 Presence of cardiac and vascular implant and graft, unspecified: Secondary | ICD-10-CM

## 2014-03-17 DIAGNOSIS — I509 Heart failure, unspecified: Secondary | ICD-10-CM | POA: Diagnosis not present

## 2014-03-17 DIAGNOSIS — I495 Sick sinus syndrome: Secondary | ICD-10-CM | POA: Diagnosis present

## 2014-03-17 DIAGNOSIS — R778 Other specified abnormalities of plasma proteins: Secondary | ICD-10-CM

## 2014-03-17 DIAGNOSIS — I248 Other forms of acute ischemic heart disease: Principal | ICD-10-CM | POA: Diagnosis present

## 2014-03-17 DIAGNOSIS — I340 Nonrheumatic mitral (valve) insufficiency: Secondary | ICD-10-CM | POA: Diagnosis not present

## 2014-03-17 DIAGNOSIS — Z950 Presence of cardiac pacemaker: Secondary | ICD-10-CM | POA: Diagnosis not present

## 2014-03-17 DIAGNOSIS — J918 Pleural effusion in other conditions classified elsewhere: Secondary | ICD-10-CM | POA: Diagnosis not present

## 2014-03-17 DIAGNOSIS — I519 Heart disease, unspecified: Secondary | ICD-10-CM | POA: Diagnosis not present

## 2014-03-17 HISTORY — DX: Dizziness and giddiness: R42

## 2014-03-17 LAB — CBC WITH DIFFERENTIAL/PLATELET
BASOS PCT: 1 % (ref 0–1)
Basophils Absolute: 0 10*3/uL (ref 0.0–0.1)
EOS ABS: 0.2 10*3/uL (ref 0.0–0.7)
EOS PCT: 4 % (ref 0–5)
HEMATOCRIT: 41.3 % (ref 39.0–52.0)
Hemoglobin: 13.7 g/dL (ref 13.0–17.0)
LYMPHS ABS: 3 10*3/uL (ref 0.7–4.0)
Lymphocytes Relative: 63 % — ABNORMAL HIGH (ref 12–46)
MCH: 30.2 pg (ref 26.0–34.0)
MCHC: 33.2 g/dL (ref 30.0–36.0)
MCV: 91.2 fL (ref 78.0–100.0)
MONOS PCT: 7 % (ref 3–12)
Monocytes Absolute: 0.3 10*3/uL (ref 0.1–1.0)
NEUTROS PCT: 25 % — AB (ref 43–77)
Neutro Abs: 1.1 10*3/uL — ABNORMAL LOW (ref 1.7–7.7)
PLATELETS: 181 10*3/uL (ref 150–400)
RBC: 4.53 MIL/uL (ref 4.22–5.81)
RDW: 13.6 % (ref 11.5–15.5)
WBC: 4.6 10*3/uL (ref 4.0–10.5)

## 2014-03-17 LAB — BASIC METABOLIC PANEL
ANION GAP: 2 — AB (ref 5–15)
BUN: 14 mg/dL (ref 6–23)
CALCIUM: 8.8 mg/dL (ref 8.4–10.5)
CO2: 26 mmol/L (ref 19–32)
CREATININE: 0.98 mg/dL (ref 0.50–1.35)
Chloride: 107 mmol/L (ref 96–112)
GFR calc Af Amer: >90 mL/min (ref >90)
GFR, EST NON AFRICAN AMERICAN: 84 mL/min — AB (ref >90)
GLUCOSE: 102 mg/dL — AB (ref 70–99)
Potassium: 3.6 mmol/L (ref 3.5–5.1)
Sodium: 135 mmol/L (ref 135–145)

## 2014-03-17 LAB — TROPONIN I: TROPONIN I: 0.08 ng/mL — AB (ref <0.031)

## 2014-03-17 MED ORDER — LISINOPRIL 10 MG PO TABS
10.0000 mg | ORAL_TABLET | Freq: Once | ORAL | Status: AC
  Administered 2014-03-17: 10 mg via ORAL
  Filled 2014-03-17: qty 1

## 2014-03-17 MED ORDER — NITROGLYCERIN IN D5W 200-5 MCG/ML-% IV SOLN
10.0000 ug/min | INTRAVENOUS | Status: DC
  Administered 2014-03-18 (×2): 10 ug/min via INTRAVENOUS
  Filled 2014-03-17 (×2): qty 250

## 2014-03-17 MED ORDER — SODIUM CHLORIDE 0.9 % IV BOLUS (SEPSIS)
1000.0000 mL | Freq: Once | INTRAVENOUS | Status: AC
  Administered 2014-03-18: 1000 mL via INTRAVENOUS

## 2014-03-17 MED ORDER — IOHEXOL 350 MG/ML SOLN
100.0000 mL | Freq: Once | INTRAVENOUS | Status: AC | PRN
  Administered 2014-03-17: 100 mL via INTRAVENOUS

## 2014-03-17 MED ORDER — ASPIRIN 81 MG PO CHEW
324.0000 mg | CHEWABLE_TABLET | Freq: Once | ORAL | Status: AC
  Administered 2014-03-17: 324 mg via ORAL
  Filled 2014-03-17: qty 4

## 2014-03-17 NOTE — ED Notes (Signed)
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

## 2014-03-17 NOTE — Evaluation (Signed)
Pt w/ cough, SOB, hypertensive urgency. Has been out of meds intermittently. Presented to [facility] ER afebrile, HR 30s-60s, BP 140s-200s/90s-110s, satting 97% on RA. Noted trop 0.08. No active CP. CXR w/ no acute abnormality, though w/ small R pleural effusion. Given full dose ASA. On NTG gtt. Pending CT Angio.  Accepted to stepdown bed.

## 2014-03-17 NOTE — ED Provider Notes (Signed)
CSN: [id number]     Arrival date & time 03/17/14  1812 History   First MD Initiated Contact with Patient 03/17/14 2107     Chief Complaint  Patient presents with  . Cough     (Consider location/radiation/quality/duration/timing/severity/associated sxs/prior Treatment) HPI  64 year old male presents with dyspnea for the past one month. His been progressively worsening. He thought his allergy related as it mostly started was on the bus but now has been more constant. He noticed this is most often while he is doing minimal exertion. He gets abdominal "pressure" while he is doing exertion as well. Occasionally has head pressure as well. No focal weakness or numbness. Has been out of his blood pressure medicines for the past several days. Normally on lisinopril 10 mg. Has a PCP, Dr. Lajuan Lines, who he has not seen for several months. Patient states that he has had a cough and felt he had fever at the beginning of the month but otherwise has not had any other fevers. Currently he has no current symptoms including no dyspnea or chest pain.  Past Medical History  Diagnosis Date  . CVA (cerebral infarction)   . Hypertension   . Hyperlipidemia   . Vertigo    Past Surgical History  Procedure Laterality Date  . Abdominal surgery     No family history on file. History  Substance Use Topics  . Smoking status: Never Smoker   . Smokeless tobacco: Never Used  . Alcohol Use: Yes     Comment: occasional    Review of Systems  Constitutional: Negative for fever.  Respiratory: Positive for cough and shortness of breath.   Cardiovascular: Negative for chest pain.  Gastrointestinal: Positive for abdominal pain.  Neurological: Positive for headaches. Negative for weakness.  All other systems reviewed and are negative.     Allergies  Review of patient's allergies indicates no known allergies.  Home Medications   Prior to Admission medications   Medication Sig Start Date End Date Taking? Authorizing  Provider  aspirin 81 MG tablet Take 160 mg by mouth daily.    Historical Provider, MD  lisinopril (PRINIVIL,ZESTRIL) 10 MG tablet Take 1 tablet (10 mg total) by mouth daily. 01/17/12   Carmin Muskrat, MD   BP 206/112 mmHg  Pulse 60  Temp(Src) 97.9 F (36.6 C) (Oral)  Resp 18  Ht 6\' 4"  (1.93 m)  Wt 214 lb 11.2 oz (97.387 kg)  BMI 26.14 kg/m2  SpO2 99% Physical Exam  Constitutional: He is oriented to person, place, and time. He appears well-developed and well-nourished.  HENT:  Head: Normocephalic and atraumatic.  Right Ear: External ear normal.  Left Ear: External ear normal.  Nose: Nose normal.  Eyes: EOM are normal. Pupils are equal, round, and reactive to light. Right eye exhibits no discharge. Left eye exhibits no discharge.  Neck: Neck supple.  Cardiovascular: Normal rate, regular rhythm, normal heart sounds and intact distal pulses.   Pulmonary/Chest: Effort normal and breath sounds normal. He has no wheezes. He has no rales.  Abdominal: Soft. He exhibits no distension. There is no tenderness.  Musculoskeletal: He exhibits no edema.  Neurological: He is alert and oriented to person, place, and time.  CN 2-12 grossly intact. 5/5 strength in all 4 extremities. Normal finger to nose. No pronator drift.  Skin: Skin is warm and dry. He is not diaphoretic.  Nursing note and vitals reviewed.   ED Course  Procedures (including critical care time) Labs Review Labs Reviewed  BASIC METABOLIC PANEL -  Abnormal; Notable for the following:    Glucose, Bld 102 (*)    GFR calc non Af Amer 84 (*)    Anion gap 2 (*)    All other components within normal limits  CBC WITH DIFFERENTIAL/PLATELET - Abnormal; Notable for the following:    Neutrophils Relative % 25 (*)    Neutro Abs 1.1 (*)    Lymphocytes Relative 63 (*)    All other components within normal limits  TROPONIN I - Abnormal; Notable for the following:    Troponin I 0.08 (*)    All other components within normal limits     Imaging Review Dg Chest 2 View  03/17/2014   CLINICAL DATA:  Cough, head/chest congestion and feeling like he is hyperventilating x 1 month; PMH of HTN; never smoked  EXAM: CHEST - 2 VIEW  COMPARISON:  10/13/2009  FINDINGS: Small right pleural effusion. Heart size upper limits normal. Prominent right perihilar interstitial markings. No confluent airspace consolidation or overt edema. Pneumothorax. Visualized skeletal structures are unremarkable.  IMPRESSION: 1. New small right pleural effusion.   Electronically Signed   By: Lucrezia Europe M.D.   On: 03/17/2014 21:15     EKG Interpretation   Date/Time:  Tuesday March 17 2014 21:40:39 EST Ventricular Rate:  55 PR Interval:  432 QRS Duration: 136 QT Interval:  432 QTC Calculation: 413 R Axis:   -28 Text Interpretation:  Sinus bradycardia with sinus arrhythmia with 1st  degree A-V block Left ventricular hypertrophy with QRS widening and  repolarization abnormality Cannot rule out Septal infarct , age  undetermined Abnormal ECG no significant change since January 2014  Confirmed by Regenia Skeeter  MD, [place] ([id number]) on 03/17/2014 10:00:05 PM      CRITICAL CARE Performed by: Sherwood Gambler T   Total critical care time: 30 minutes  Critical care time was exclusive of separately billable procedures and treating other patients.  Critical care was necessary to treat or prevent imminent or life-threatening deterioration.  Critical care was time spent personally by me on the following activities: development of treatment plan with patient and/or surrogate as well as nursing, discussions with consultants, evaluation of patient's response to treatment, examination of patient, obtaining history from patient or surrogate, ordering and performing treatments and interventions, ordering and review of laboratory studies, ordering and review of radiographic studies, pulse oximetry and re-evaluation of patient's condition.  MDM   Final diagnoses:   Hypertensive emergency  Elevated troponin  Pleural effusion, right    Patient is currently asymptomatic but does present with a blood pressure of 206/112. Regionally given lisinopril and it was noted the patient has elevated troponin. This is likely related to poorly controlled hypertension. I am concerned about the patient having significant exertional dyspnea on minimal exertion. With his elevated troponin I'm concerned that he has some sort of coronary disease that will need further management and workup. His blood pressure has come down somewhat but not 25% of his original highest blood pressure. Will start a nitro drip after discussion with the hospitalist, Dr. Ernestina Patches. We also talked about the possibility of pulmonary embolism, he does not have any risk factors be given his elevated troponin will get CT prior to admission to the stepdown unit at [facility], MD 03/17/14 2340

## 2014-03-18 ENCOUNTER — Encounter (HOSPITAL_COMMUNITY): Payer: Self-pay | Admitting: *Deleted

## 2014-03-18 DIAGNOSIS — I10 Essential (primary) hypertension: Secondary | ICD-10-CM | POA: Diagnosis present

## 2014-03-18 DIAGNOSIS — R05 Cough: Secondary | ICD-10-CM | POA: Diagnosis present

## 2014-03-18 DIAGNOSIS — I214 Non-ST elevation (NSTEMI) myocardial infarction: Secondary | ICD-10-CM | POA: Diagnosis present

## 2014-03-18 DIAGNOSIS — E785 Hyperlipidemia, unspecified: Secondary | ICD-10-CM | POA: Diagnosis present

## 2014-03-18 DIAGNOSIS — R7989 Other specified abnormal findings of blood chemistry: Secondary | ICD-10-CM

## 2014-03-18 DIAGNOSIS — R778 Other specified abnormalities of plasma proteins: Secondary | ICD-10-CM | POA: Diagnosis present

## 2014-03-18 DIAGNOSIS — J90 Pleural effusion, not elsewhere classified: Secondary | ICD-10-CM | POA: Diagnosis present

## 2014-03-18 DIAGNOSIS — I509 Heart failure, unspecified: Secondary | ICD-10-CM

## 2014-03-18 DIAGNOSIS — R059 Cough, unspecified: Secondary | ICD-10-CM | POA: Diagnosis present

## 2014-03-18 DIAGNOSIS — R001 Bradycardia, unspecified: Secondary | ICD-10-CM

## 2014-03-18 LAB — COMPREHENSIVE METABOLIC PANEL WITH GFR
ALT: 40 U/L (ref 0–53)
AST: 33 U/L (ref 0–37)
Albumin: 3.5 g/dL (ref 3.5–5.2)
Alkaline Phosphatase: 53 U/L (ref 39–117)
Anion gap: 5 (ref 5–15)
BUN: 11 mg/dL (ref 6–23)
CO2: 28 mmol/L (ref 19–32)
Calcium: 9.2 mg/dL (ref 8.4–10.5)
Chloride: 105 mmol/L (ref 96–112)
Creatinine, Ser: 0.99 mg/dL (ref 0.50–1.35)
GFR calc Af Amer: >90 mL/min (ref >90)
GFR calc non Af Amer: 84 mL/min — ABNORMAL LOW (ref >90)
Glucose, Bld: 94 mg/dL (ref 70–99)
Potassium: 3.9 mmol/L (ref 3.5–5.1)
Sodium: 138 mmol/L (ref 135–145)
Total Bilirubin: 1.7 mg/dL — ABNORMAL HIGH (ref 0.3–1.2)
Total Protein: 7.2 g/dL (ref 6.0–8.3)

## 2014-03-18 LAB — RAPID URINE DRUG SCREEN, HOSP PERFORMED
AMPHETAMINES: NOT DETECTED
BENZODIAZEPINES: NOT DETECTED
Barbiturates: NOT DETECTED
Cocaine: NOT DETECTED
OPIATES: NOT DETECTED
TETRAHYDROCANNABINOL: NOT DETECTED

## 2014-03-18 LAB — BRAIN NATRIURETIC PEPTIDE: B Natriuretic Peptide: 405.5 pg/mL — ABNORMAL HIGH (ref 0.0–100.0)

## 2014-03-18 LAB — LIPID PANEL
Cholesterol: 231 mg/dL — ABNORMAL HIGH (ref 0–200)
HDL: 57 mg/dL (ref >39)
LDL Cholesterol: 162 mg/dL — ABNORMAL HIGH (ref 0–99)
Total CHOL/HDL Ratio: 4.1 ratio
Triglycerides: 58 mg/dL (ref <150)
VLDL: 12 mg/dL (ref 0–40)

## 2014-03-18 LAB — TROPONIN I
Troponin I: 0.07 ng/mL — ABNORMAL HIGH (ref <0.031)
Troponin I: 0.06 ng/mL — ABNORMAL HIGH (ref <0.031)
Troponin I: 0.06 ng/mL — ABNORMAL HIGH (ref <0.031)
Troponin I: 0.05 ng/mL — ABNORMAL HIGH (ref <0.031)

## 2014-03-18 LAB — TSH: TSH: 1.693 u[IU]/mL (ref 0.350–4.500)

## 2014-03-18 LAB — MRSA PCR SCREENING: MRSA BY PCR: NEGATIVE

## 2014-03-18 LAB — HEPARIN LEVEL (UNFRACTIONATED)
Heparin Unfractionated: 0.16 [IU]/mL — ABNORMAL LOW (ref 0.30–0.70)
Heparin Unfractionated: 0.46 IU/mL (ref 0.30–0.70)

## 2014-03-18 MED ORDER — FUROSEMIDE 10 MG/ML IJ SOLN
20.0000 mg | Freq: Once | INTRAMUSCULAR | Status: AC
  Administered 2014-03-18: 07:00:00 20 mg via INTRAVENOUS
  Filled 2014-03-18: qty 2

## 2014-03-18 MED ORDER — HEPARIN BOLUS VIA INFUSION
3000.0000 [IU] | Freq: Once | INTRAVENOUS | Status: AC
  Administered 2014-03-18: 3000 [IU] via INTRAVENOUS
  Filled 2014-03-18: qty 3000

## 2014-03-18 MED ORDER — HEPARIN BOLUS VIA INFUSION
4000.0000 [IU] | Freq: Once | INTRAVENOUS | Status: AC
  Administered 2014-03-18: 4000 [IU] via INTRAVENOUS
  Filled 2014-03-18: qty 4000

## 2014-03-18 MED ORDER — ONDANSETRON HCL 4 MG/2ML IJ SOLN: 4.0000 mg | Freq: Four times a day (QID) | INTRAMUSCULAR | Status: DC | PRN

## 2014-03-18 MED ORDER — ASPIRIN EC 325 MG PO TBEC
325.0000 mg | DELAYED_RELEASE_TABLET | Freq: Every day | ORAL | Status: DC
  Administered 2014-03-18: 11:00:00 325 mg via ORAL
  Filled 2014-03-18: qty 1

## 2014-03-18 MED ORDER — PNEUMOCOCCAL VAC POLYVALENT 25 MCG/0.5ML IJ INJ
0.5000 mL | INJECTION | INTRAMUSCULAR | Status: AC
  Administered 2014-03-20: 09:00:00 0.5 mL via INTRAMUSCULAR
  Filled 2014-03-18: qty 0.5

## 2014-03-18 MED ORDER — LISINOPRIL 10 MG PO TABS
10.0000 mg | ORAL_TABLET | Freq: Every day | ORAL | Status: DC
  Administered 2014-03-18 – 2014-03-19 (×2): 10 mg via ORAL
  Filled 2014-03-18 (×3): qty 1

## 2014-03-18 MED ORDER — ONDANSETRON HCL 4 MG PO TABS: 4.0000 mg | ORAL_TABLET | Freq: Four times a day (QID) | ORAL | Status: DC | PRN

## 2014-03-18 MED ORDER — ACETAMINOPHEN 325 MG PO TABS
650.0000 mg | ORAL_TABLET | Freq: Four times a day (QID) | ORAL | Status: DC | PRN
  Administered 2014-03-19: 02:00:00 650 mg via ORAL
  Filled 2014-03-18: qty 2

## 2014-03-18 MED ORDER — AMLODIPINE BESYLATE 5 MG PO TABS
5.0000 mg | ORAL_TABLET | Freq: Every day | ORAL | Status: DC
  Administered 2014-03-18 – 2014-03-19 (×2): 5 mg via ORAL
  Filled 2014-03-18 (×3): qty 1

## 2014-03-18 MED ORDER — HEPARIN (PORCINE) IN NACL 100-0.45 UNIT/ML-% IJ SOLN
1400.0000 [IU]/h | INTRAMUSCULAR | Status: DC
  Administered 2014-03-18: 08:00:00 1200 [IU]/h via INTRAVENOUS
  Administered 2014-03-18: 1400 [IU]/h via INTRAVENOUS
  Filled 2014-03-18 (×4): qty 250

## 2014-03-18 MED ORDER — SODIUM CHLORIDE 0.9 % IJ SOLN
3.0000 mL | Freq: Two times a day (BID) | INTRAMUSCULAR | Status: DC
  Administered 2014-03-18 – 2014-03-19 (×2): 3 mL via INTRAVENOUS

## 2014-03-18 MED ORDER — HYDRALAZINE HCL 20 MG/ML IJ SOLN: 10.0000 mg | Freq: Four times a day (QID) | INTRAMUSCULAR | Status: DC | PRN

## 2014-03-18 MED ORDER — HYDRALAZINE HCL 25 MG PO TABS
25.0000 mg | ORAL_TABLET | Freq: Four times a day (QID) | ORAL | Status: DC
  Filled 2014-03-18 (×4): qty 1

## 2014-03-18 MED ORDER — ACETAMINOPHEN 650 MG RE SUPP: 650.0000 mg | Freq: Four times a day (QID) | RECTAL | Status: DC | PRN

## 2014-03-18 MED ORDER — FUROSEMIDE 20 MG PO TABS
20.0000 mg | ORAL_TABLET | Freq: Once | ORAL | Status: AC
  Administered 2014-03-18: 20 mg via ORAL
  Filled 2014-03-18: qty 1

## 2014-03-18 NOTE — Progress Notes (Signed)
  Echocardiogram 2D Echocardiogram has been performed.  Phillip Booth 03/18/2014, 11:17 AM

## 2014-03-18 NOTE — Progress Notes (Signed)
ANTICOAGULATION CONSULT NOTE  Pharmacy Consult for Heparin Indication: chest pain/ACS  No Known Allergies  Labs:  Recent Labs  03/17/14 2151  03/18/14 0815 03/18/14 1220 03/18/14 1400 03/18/14 1901 03/18/14 2258  HGB 13.7  --   --   --   --   --   --   HCT 41.3  --   --   --   --   --   --   PLT 181  --   --   --   --   --   --   HEPARINUNFRC  --   --   --   --  0.16*  --  0.46  CREATININE 0.98  --  0.99  --   --   --   --   TROPONINI 0.08*  < > 0.06* 0.06*  --  0.05*  --   < > = values in this interval not displayed.  Estimated Creatinine Clearance: 91.3 mL/min (by C-G formula based on Cr of 0.99).   Assessment:  6 hour heparin level = 0.46 on heparin rate 1400 units/hr in this 64 y.o. male with SOB/elevated cardiac markers. No bleeding noted. Heparin level is therapeutic.  Chest CT was negative for PE Cath planned for 3/10   Goal of Therapy:  Heparin level 0.3-0.7 units/ml Monitor platelets by anticoagulation protocol: Yes   Plan:  Continue heparin 1400 units/hr Check heparin level in AM. Daily HL, CBC.    Thank you. Nicole Cella, RPh Clinical Pharmacist Pager: [phone] 03/18/2014,11:34 PM

## 2014-03-18 NOTE — ED Provider Notes (Addendum)
EKG Interpretation  Date/Time:  Wednesday March 18 2014 03:02:16 EST Ventricular Rate:  41 PR Interval:  432 QRS Duration: 130 QT Interval:  462 QTC Calculation: 381 R Axis:   -33 Text Interpretation:  Wide QRS rhythm Left axis deviation Non-specific intra-ventricular conduction block Cannot rule out Septal infarct , age undetermined T wave abnormality, consider lateral ischemia Abnormal ECG P waves no longer present Confirmed by Lurine Imel  MD, Jenny Reichmann ([id number]) on 03/18/2014 3:14:33 AM      3:15 AM [facility] on way to take patient to [facility]. EKG repeated due to bradycardia. P waves are no longer seen.   3:24 AM Sinus rhythm on monitor with significant first-degree block; P waves now present.    Shanon Rosser, MD 03/18/14 [place], MD 03/18/14 [phone]

## 2014-03-18 NOTE — Consult Note (Addendum)
Cardiology Consultation  Phillip Booth    [id number] August 26, 1950  Reason for Consult:  Mildly positive troponin  Requesting Physician: [facility]  Primary Cardiologist: Dr Gerlene Burdock  HPI: [name] is a 64 year old African-American male who has a history of hypertension, and hyperlipidemia.  He states over the years he has been intermittently taking blood pressure medication.  Most recently he has been on lisinopril but has run out of this approximately 4 days ago.  He also was taking simvastatin.  He works as a Biomedical engineer.  Over the past 6 months he has noticed a definite change in his ability to walk.  He admits to exertional shortness of breath.  He also has noticed that if he walks fast he develops a tightness in his chest.  He has notices most when he has a short time on his bus where he can have a bathroom break.  For this reason, he walks fast, but oftentimes once he reaches the restroom, he has to stop because of tightness and shortness of breath.  The past several days.  He is not taking his medications.  He was transferred from [facility] with accelerated hypertension.  He also has been noted to have bradycardia arrhythmia with transient junctional rhythm, bradycardia, and first-degree heart block.  A CT angiogram did not show evidence for pulmonary embolism but suggested small bilateral pleural effusions and groundglass opacities concerning for edema versus pneumonia. Cardiology consultation is now requested. Cardiac risk factors are notable for male sex greater than age 62, hypertension, hyperlipidemia, and family history. The patient had an episode of vertigo in 2009, but states he was ultimately told that he did not have a stroke   Past Medical History  Diagnosis Date  . CVA (cerebral infarction)   . Hypertension   . Hyperlipidemia   . Vertigo   . Stroke    Past Surgical History  Procedure Laterality Date  . Abdominal surgery    .  Hernia repair    . Appendectomy      FAMHx: Family History  Problem Relation Age of Onset  . Hypertension Mother   . Hypertension Father    Additional family history is notable that mother died at 64 and had an enlarged heart.  Father died at age 56 and had heart disease and hypertension.  SOCHx:  reports that he has never smoked. He has never used smokeless tobacco. He reports that he drinks alcohol. He reports that he does not use illicit drugs.Marland Kitchen  He currently works as a Copywriter, advertising.  He has one stepchild.  ALLERGIES: No Known Allergies   HOME MEDICATIONS: Prescriptions prior to admission  Medication Sig Dispense Refill Last Dose  . aspirin 81 MG tablet Take 162 mg by mouth daily.    Past Week at Unknown time  . lisinopril (PRINIVIL,ZESTRIL) 10 MG tablet Take 1 tablet (10 mg total) by mouth daily. 30 tablet 3 Past Week at Unknown time    HOSPITAL MEDICATIONS: . aspirin EC  325 mg Oral Daily  . lisinopril  10 mg Oral Daily  . [START ON 03/19/2014] pneumococcal 23 valent vaccine  0.5 mL Intramuscular Tomorrow-1000  . sodium chloride  3 mL Intravenous Q12H   . heparin 1,200 Units/hr (03/18/14 0742)  . nitroGLYCERIN 10 mcg/min (03/18/14 0700)   ROS General: Negative; No fevers, chills, or night sweats;  HEENT: Negative; No changes in vision or hearing, sinus congestion, difficulty swallowing Pulmonary: Recent increasing congestion for which he has  taken Robitussin for cough Cardiovascular: See history of present illness GI: Negative; No nausea, vomiting, diarrhea, or abdominal pain GU: Negative; No dysuria, hematuria, or difficulty voiding Musculoskeletal: Negative; no myalgias, joint pain, or weakness Hematologic/Oncology: Negative; no easy bruising, bleeding Endocrine: Negative; no heat/cold intolerance; no diabetes Neuro: Remote history of vertigo Skin: Negative; No rashes or skin lesions Psychiatric: Negative; No behavioral problems, depression Sleep:  Negative; No snoring, daytime sleepiness, hypersomnolence, bruxism, restless legs, hypnogognic hallucinations, no cataplexy Other comprehensive 14 point system review is negative.   VITALS: Blood pressure 171/99, pulse 65, temperature 97.5 F (36.4 C), temperature source Oral, resp. rate 18, height _0  (1.93 m), weight 214 lb 1.1 oz (97.1 kg), SpO2 100 %.  PHYSICAL EXAM: General appearance: alert, cooperative and no distress Neck: no adenopathy, no carotid bruit, no JVD, supple, symmetrical, trachea midline and thyroid not enlarged, symmetric, no tenderness/mass/nodules Lungs: clear to auscultation bilaterally Heart: regular rate and rhythm and 2/6 systolic murmur.  No S3 gallop.  No diastolic murmur, rub, thrills or heaves Abdomen: soft, non-tender; bowel sounds normal; no masses,  no organomegaly Extremities: extremities normal, atraumatic, no cyanosis or edema Pulses: 2+ and symmetric Skin: Skin color, texture, turgor normal. No rashes or lesions Neurologic: Grossly normal  ECG (independently read by me): Sinus bradycardia with first-degree AV block with PR interval 366 ms  LABS: Results for orders placed or performed during the hospital encounter of 03/17/14 (from the past 48 hour(s))  Basic metabolic panel     Status: Abnormal   Collection Time: 03/17/14  9:51 PM  Result Value Ref Range   Sodium 135 135 - 145 mmol/L   Potassium 3.6 3.5 - 5.1 mmol/L   Chloride 107 96 - 112 mmol/L   CO2 26 19 - 32 mmol/L   Glucose, Bld 102 (H) 70 - 99 mg/dL   BUN 14 6 - 23 mg/dL   Creatinine, Ser 0.98 0.50 - 1.35 mg/dL   Calcium 8.8 8.4 - 10.5 mg/dL   GFR calc non Af Amer 84 (L) >90 mL/min   GFR calc Af Amer >90 >90 mL/min    Comment: (NOTE) The eGFR has been calculated using the CKD EPI equation. This calculation has not been validated in all clinical situations. eGFR's persistently <90 mL/min signify possible Chronic Kidney Disease.    Anion gap 2 (L) 5 - 15  CBC with Differential      Status: Abnormal   Collection Time: 03/17/14  9:51 PM  Result Value Ref Range   WBC 4.6 4.0 - 10.5 K/uL   RBC 4.53 4.22 - 5.81 MIL/uL   Hemoglobin 13.7 13.0 - 17.0 g/dL   HCT 41.3 39.0 - 52.0 %   MCV 91.2 78.0 - 100.0 fL   MCH 30.2 26.0 - 34.0 pg   MCHC 33.2 30.0 - 36.0 g/dL   RDW 13.6 11.5 - 15.5 %   Platelets 181 150 - 400 K/uL   Neutrophils Relative % 25 (L) 43 - 77 %   Neutro Abs 1.1 (L) 1.7 - 7.7 K/uL   Lymphocytes Relative 63 (H) 12 - 46 %   Lymphs Abs 3.0 0.7 - 4.0 K/uL   Monocytes Relative 7 3 - 12 %   Monocytes Absolute 0.3 0.1 - 1.0 K/uL   Eosinophils Relative 4 0 - 5 %   Eosinophils Absolute 0.2 0.0 - 0.7 K/uL   Basophils Relative 1 0 - 1 %   Basophils Absolute 0.0 0.0 - 0.1 K/uL  Troponin I  Status: Abnormal   Collection Time: 03/17/14  9:51 PM  Result Value Ref Range   Troponin I 0.08 (H) <0.031 ng/mL    Comment:        PERSISTENTLY INCREASED TROPONIN VALUES IN THE RANGE OF 0.04-0.49 ng/mL CAN BE SEEN IN:       -UNSTABLE ANGINA       -CONGESTIVE HEART FAILURE       -MYOCARDITIS       -CHEST TRAUMA       -ARRYHTHMIAS       -LATE PRESENTING MYOCARDIAL INFARCTION       -COPD   CLINICAL FOLLOW-UP RECOMMENDED.   Troponin I     Status: Abnormal   Collection Time: 03/18/14  1:45 AM  Result Value Ref Range   Troponin I 0.07 (H) <0.031 ng/mL    Comment:        PERSISTENTLY INCREASED TROPONIN VALUES IN THE RANGE OF 0.04-0.49 ng/mL CAN BE SEEN IN:       -UNSTABLE ANGINA       -CONGESTIVE HEART FAILURE       -MYOCARDITIS       -CHEST TRAUMA       -ARRYHTHMIAS       -LATE PRESENTING MYOCARDIAL INFARCTION       -COPD   CLINICAL FOLLOW-UP RECOMMENDED.   MRSA PCR Screening     Status: None   Collection Time: 03/18/14  4:46 AM  Result Value Ref Range   MRSA by PCR NEGATIVE NEGATIVE    Comment:        The GeneXpert MRSA Assay (FDA approved for NASAL specimens only), is one component of a comprehensive MRSA colonization surveillance program. It is  not intended to diagnose MRSA infection nor to guide or monitor treatment for MRSA infections.   Urine rapid drug screen (hosp performed)     Status: None   Collection Time: 03/18/14  7:49 AM  Result Value Ref Range   Opiates NONE DETECTED NONE DETECTED   Cocaine NONE DETECTED NONE DETECTED   Benzodiazepines NONE DETECTED NONE DETECTED   Amphetamines NONE DETECTED NONE DETECTED   Tetrahydrocannabinol NONE DETECTED NONE DETECTED   Barbiturates NONE DETECTED NONE DETECTED    Comment:        DRUG SCREEN FOR MEDICAL PURPOSES ONLY.  IF CONFIRMATION IS NEEDED FOR ANY PURPOSE, NOTIFY LAB WITHIN 5 DAYS.        LOWEST DETECTABLE LIMITS FOR URINE DRUG SCREEN Drug Class       Cutoff (ng/mL) Amphetamine      1000 Barbiturate      200 Benzodiazepine   200 Tricyclics       300 Opiates          300 Cocaine          300 THC              50   Brain natriuretic peptide     Status: Abnormal   Collection Time: 03/18/14  8:15 AM  Result Value Ref Range   B Natriuretic Peptide 405.5 (H) 0.0 - 100.0 pg/mL  Troponin I (q 6hr x 3)     Status: Abnormal   Collection Time: 03/18/14  8:15 AM  Result Value Ref Range   Troponin I 0.06 (H) <0.031 ng/mL    Comment:        PERSISTENTLY INCREASED TROPONIN VALUES IN THE RANGE OF 0.04-0.49 ng/mL CAN BE SEEN IN:       -UNSTABLE ANGINA       -  CONGESTIVE HEART FAILURE       -MYOCARDITIS       -CHEST TRAUMA       -ARRYHTHMIAS       -LATE PRESENTING MYOCARDIAL INFARCTION       -COPD   CLINICAL FOLLOW-UP RECOMMENDED.   TSH     Status: None   Collection Time: 03/18/14  8:15 AM  Result Value Ref Range   TSH 1.693 0.350 - 4.500 uIU/mL  Comprehensive metabolic panel     Status: Abnormal   Collection Time: 03/18/14  8:15 AM  Result Value Ref Range   Sodium 138 135 - 145 mmol/L   Potassium 3.9 3.5 - 5.1 mmol/L   Chloride 105 96 - 112 mmol/L   CO2 28 19 - 32 mmol/L   Glucose, Bld 94 70 - 99 mg/dL   BUN 11 6 - 23 mg/dL   Creatinine, Ser 0.99 0.50 -  1.35 mg/dL   Calcium 9.2 8.4 - 10.5 mg/dL   Total Protein 7.2 6.0 - 8.3 g/dL   Albumin 3.5 3.5 - 5.2 g/dL   AST 33 0 - 37 U/L   ALT 40 0 - 53 U/L   Alkaline Phosphatase 53 39 - 117 U/L   Total Bilirubin 1.7 (H) 0.3 - 1.2 mg/dL   GFR calc non Af Amer 84 (L) >90 mL/min   GFR calc Af Amer >90 >90 mL/min    Comment: (NOTE) The eGFR has been calculated using the CKD EPI equation. This calculation has not been validated in all clinical situations. eGFR's persistently <90 mL/min signify possible Chronic Kidney Disease.    Anion gap 5 5 - 15  Lipid panel     Status: Abnormal   Collection Time: 03/18/14  8:15 AM  Result Value Ref Range   Cholesterol 231 (H) 0 - 200 mg/dL   Triglycerides 58 <150 mg/dL   HDL 57 >39 mg/dL   Total CHOL/HDL Ratio 4.1 RATIO   VLDL 12 0 - 40 mg/dL   LDL Cholesterol 162 (H) 0 - 99 mg/dL    Comment:        Total Cholesterol/HDL:CHD Risk Coronary Heart Disease Risk Table                     Men   Women  1/2 Average Risk   3.4   3.3  Average Risk       5.0   4.4  2 X Average Risk   9.6   7.1  3 X Average Risk  23.4   11.0        Use the calculated Patient Ratio above and the CHD Risk Table to determine the patient's CHD Risk.        ATP III CLASSIFICATION (LDL):  <100     mg/dL   Optimal  100-129  mg/dL   Near or Above                    Optimal  130-159  mg/dL   Borderline  160-189  mg/dL   High  >190     mg/dL   Very High     IMAGING: Dg Chest 2 View  03/17/2014   CLINICAL DATA:  Cough, head/chest congestion and feeling like he is hyperventilating x 1 month; PMH of HTN; never smoked  EXAM: CHEST - 2 VIEW  COMPARISON:  10/13/2009  FINDINGS: Small right pleural effusion. Heart size upper limits normal. Prominent right perihilar interstitial markings. No confluent airspace consolidation or  overt edema. Pneumothorax. Visualized skeletal structures are unremarkable.  IMPRESSION: 1. New small right pleural effusion.   Electronically Signed   By: D  Hassell  M.D.   On: 03/17/2014 21:15   Ct Angio Chest Pe W/cm &/or Wo Cm  03/18/2014   CLINICAL DATA:  Chest pain, shortness of breath, cough, and head congestion. Hyperventilated for 1 month.  EXAM: CT ANGIOGRAPHY CHEST WITH CONTRAST  TECHNIQUE: Multidetector CT imaging of the chest was performed using the standard protocol during bolus administration of intravenous contrast. Multiplanar CT image reconstructions and MIPs were obtained to evaluate the vascular anatomy.  CONTRAST:  100mL OMNIPAQUE IOHEXOL 350 MG/ML SOLN  COMPARISON:  None.  FINDINGS: Technically adequate study with good opacification of the central and segmental pulmonary arteries. No focal filling defects demonstrated. No evidence of significant pulmonary embolus.  Normal heart size. Normal caliber thoracic aorta. Coronary artery and aortic calcifications. Esophagus is decompressed. Mediastinal and hilar lymph nodes are upper limits of normal, possibly reactive. Small bilateral pleural effusions with bilateral basilar atelectasis. Vague patchy ground-glass infiltration scattered throughout both lungs but more prominent in the right middle lung. This may represent multifocal pneumonia or edema. Airways appear patent. No pneumothorax.  Included portions of the upper abdominal organs are grossly unremarkable.  Review of the MIP images confirms the above findings.  IMPRESSION: No evidence of significant pulmonary embolus. Small bilateral pleural effusions. Patchy ground-glass infiltration in the lungs, mostly on the right. This may indicate pneumonia or edema.   Electronically Signed   By: William  Stevens M.D.   On: 03/18/2014 00:16    IMPRESSION:  1.  Mildly positive troponin.  This may be secondary to demand ischemia with recent accelerated hypertension since he had run out of his ace inhibition 4 days ago.  However, his history is concerning for ischemic etiology and may be due to non-ST segment elevation myocardial infarction.  Over the past 6 months  he has noticed a definite decline in exercise capacity with exertional dyspnea and has noticed episodes of chest tightness associated with difficulty breathing.  Recommend serial troponins and ECG.  He is currently on IV heparin and nitroglycerin.  Would continue until definitive cardiac catheterization tomorrow.  2.  Mild CHF, suspect significant diastolic component with his hypertensive heart disease.  BNP is mildly elevated.  CT scan revealed mild bilateral pleural effusions possible edema.  3.  Accelerated hypertension contributed by recent medication noncompliance since running out of his lisinopril.  4.  Bradycardia with prolonged first-degree AV block and transient junctional rhythm.  Will need to make certain this is not due to potential RCA ischemia versus sick sinus syndrome.  4.  Hyperlipidemia with LDL at 162.  He states he has been on simvastatin.  This will need to be discontinued and changed to more aggressive lipid-lowering therapy with target LDL less than 70.  We'll start atorvastatin at 40 mg.  5.  Cardiac murmur.  2-D echo Doppler study was recently completed.   RECOMMENDATION:  In light of the patient's cardiac risk factors, progressive exertional shortness of breath with episodes of chest pressure, significant bradycardia arrhythmia, and mildly positive troponins, I have recommended definitive cardiac catheterization to delineate his coronary anatomy.  Presently, I am adding amlodipine 5 mg daily for blood pressure control, which should not have any significant negative chronotropic effect.  I will also give him an dose of furosemide 20 mg orally additionally today.  We will try to obtain more information concerning a previous stroke history.    The 2-D echo Doppler study was just recently completed.  This will be reviewed.  I will schedule him to undergo diagnostic cardiac catheterization tomorrow. The risks and benefits of a cardiac catheterization including, but not limited to,  death, stroke, MI, kidney damage and bleeding were discussed with the patient and his wife who indicate understanding and agree to proceed.   Attending:  [name], MD, [facility] 03/18/2014 11:37 AM

## 2014-03-18 NOTE — H&P (Addendum)
[facility] History and Physical  Phillip Booth DGU:[id number] DOB: 05/30/1950 DOA: 03/17/2014  Referring physician: Patient was transferred from [facility]. PCP: No primary care provider on file. Dr. Lajuan Lines at [facility].  Chief Complaint: Cough and shortness of breath.  HPI: Phillip Booth is a 64 y.o. male with history of hypertension, hyperlipidemia and stroke present in the ER because of progressive shortness of breath and cough over the last 1 week. Patient denies any chest pain fever chills. In the ER patient was found to have very elevated blood pressure and CT angiogram of the chest done did not show any PE but did show bilateral pleural effusion and groundglass opacities concerning for edema versus pneumonia. Patient was also found to be bradycardic with at times going into junctional rhythm. Patient's troponin was mildly elevated. Patient states that he has ran out of his antihypertensive over the last 3-4 days. Patient otherwise denies any headache visual symptoms of nausea vomiting abdominal pain or diarrhea. Patient was started on nitroglycerin infusion and admitted for further management.  Review of Systems: As presented in the history of presenting illness, rest negative.  Past Medical History  Diagnosis Date  . CVA (cerebral infarction)   . Hypertension   . Hyperlipidemia   . Vertigo   . Stroke    Past Surgical History  Procedure Laterality Date  . Abdominal surgery    . Hernia repair    . Appendectomy     Social History:  reports that he has never smoked. He has never used smokeless tobacco. He reports that he drinks alcohol. He reports that he does not use illicit drugs. Where does patient live home. Can patient participate in ADLs? Yes.  No Known Allergies  Family History:  Family History  Problem Relation Age of Onset  . Hypertension Mother   . Hypertension Father       Prior to Admission medications   Medication Sig Start  Date End Date Taking? Authorizing Provider  aspirin 81 MG tablet Take 160 mg by mouth daily.    Historical Provider, MD  lisinopril (PRINIVIL,ZESTRIL) 10 MG tablet Take 1 tablet (10 mg total) by mouth daily. 01/17/12   Carmin Muskrat, MD    Physical Exam: Filed Vitals:   03/18/14 0245 03/18/14 0300 03/18/14 0315 03/18/14 0453  BP: 141/83 149/88 143/77 179/106  Pulse: 42 42 54 55  Temp:    97.5 F (36.4 C)  TempSrc:    Oral  Resp:    18  Height:      Weight:    97.1 kg (214 lb 1.1 oz)  SpO2: 99% 99% 100% 99%     General:  Well-developed and nourished.  Eyes: Anicteric no pallor.  ENT: No discharge from the ears eyes nose and mouth.  Neck: No mass felt.  Cardiovascular: S1-S2 heard.  Respiratory: No rhonchi or crepitations.  Abdomen: Soft nontender bowel sounds present.  Skin: No rash.  Musculoskeletal: No edema.  Psychiatric: Appears normal.  Neurologic: Alert awake oriented to time place and person. Moves all extremities.  Labs on Admission:  Basic Metabolic Panel:  Recent Labs Lab 03/17/14 2151  NA 135  K 3.6  CL 107  CO2 26  GLUCOSE 102*  BUN 14  CREATININE 0.98  CALCIUM 8.8   Liver Function Tests: No results for input(s): AST, ALT, ALKPHOS, BILITOT, PROT, ALBUMIN in the last 168 hours. No results for input(s): LIPASE, AMYLASE in the last 168 hours. No results for input(s): AMMONIA in the  last 168 hours. CBC:  Recent Labs Lab 03/17/14 2151  WBC 4.6  NEUTROABS 1.1*  HGB 13.7  HCT 41.3  MCV 91.2  PLT 181   Cardiac Enzymes:  Recent Labs Lab 03/17/14 2151 03/18/14 0145  TROPONINI 0.08* 0.07*    BNP (last 3 results) No results for input(s): BNP in the last 8760 hours.  ProBNP (last 3 results) No results for input(s): PROBNP in the last 8760 hours.  CBG: No results for input(s): GLUCAP in the last 168 hours.  Radiological Exams on Admission: Dg Chest 2 View  03/17/2014   CLINICAL DATA:  Cough, head/chest congestion and feeling  like he is hyperventilating x 1 month; PMH of HTN; never smoked  EXAM: CHEST - 2 VIEW  COMPARISON:  10/13/2009  FINDINGS: Small right pleural effusion. Heart size upper limits normal. Prominent right perihilar interstitial markings. No confluent airspace consolidation or overt edema. Pneumothorax. Visualized skeletal structures are unremarkable.  IMPRESSION: 1. New small right pleural effusion.   Electronically Signed   By: Lucrezia Europe M.D.   On: 03/17/2014 21:15   Ct Angio Chest Pe W/cm &/or Wo Cm  03/18/2014   CLINICAL DATA:  Chest pain, shortness of breath, cough, and head congestion. Hyperventilated for 1 month.  EXAM: CT ANGIOGRAPHY CHEST WITH CONTRAST  TECHNIQUE: Multidetector CT imaging of the chest was performed using the standard protocol during bolus administration of intravenous contrast. Multiplanar CT image reconstructions and MIPs were obtained to evaluate the vascular anatomy.  CONTRAST:  176mL OMNIPAQUE IOHEXOL 350 MG/ML SOLN  COMPARISON:  None.  FINDINGS: Technically adequate study with good opacification of the central and segmental pulmonary arteries. No focal filling defects demonstrated. No evidence of significant pulmonary embolus.  Normal heart size. Normal caliber thoracic aorta. Coronary artery and aortic calcifications. Esophagus is decompressed. Mediastinal and hilar lymph nodes are upper limits of normal, possibly reactive. Small bilateral pleural effusions with bilateral basilar atelectasis. Vague patchy ground-glass infiltration scattered throughout both lungs but more prominent in the right middle lung. This may represent multifocal pneumonia or edema. Airways appear patent. No pneumothorax.  Included portions of the upper abdominal organs are grossly unremarkable.  Review of the MIP images confirms the above findings.  IMPRESSION: No evidence of significant pulmonary embolus. Small bilateral pleural effusions. Patchy ground-glass infiltration in the lungs, mostly on the right. This  may indicate pneumonia or edema.   Electronically Signed   By: Lucienne Capers M.D.   On: 03/18/2014 00:16    EKG: Independently reviewed. Initial EKG shows sinus bradycardia with first-degree AV block and nonspecific ST-T changes with intraventricular conduction delay. Second EKG shows junctional rhythm. Patient monitor this time shows sinus bradycardia with first-degree AV block.  Assessment/Plan Principal Problem:   Hypertensive emergency Active Problems:   Cough   Elevated troponin   Pleural effusion, right   Bradycardia   Hyperlipidemia   1. Hypertensive emergency - patient has been placed on nitroglycerin infusion. Since patient also has bilateral pleural effusion and exertional shortness of breath I have ordered 1 dose of Lasix 20 mg IV. Check BNP. Cycle cardiac markers. Aspirin. Check 2-D echo. Continue patient's lisinopril 10 mg by mouth daily one dose now. Closely follow blood pressure trends. 2. Bradycardia with at times junctional rhythm - consulted cardiology for further recommendations. Discussed with [facility] Card[facility] App. Check TSH. Cycle cardiac markers. 3. Elevated troponin - patient denies any chest pain though patient does have exertional shortness of breath. We will cycle cardiac markers and patient has  been placed on heparin infusion and nitroglycerin infusion. Patient will be kept nothing by mouth in anticipation of possible cardiac procedure. Aspirin. 4. History of hyperlipidemia - patient does not recall the medication he used to take. Check lipid panel. 5. History of stroke.   DVT Prophylaxis on heparin infusion.  Code Status: Full code.  Family Communication: None.  Disposition Plan: Admit to inpatient.    Dayshon Roback N. [facility] Pager [phone].  If 7PM-7AM, please contact night-coverage [email or web address] Password [facility] 03/18/2014, 6:36 AM

## 2014-03-18 NOTE — Progress Notes (Addendum)
Patient ID: Phillip Booth, male   DOB: 04-27-1950, 64 y.o.   MRN: [id number] [facility] PROGRESS NOTE  Finnley Larusso RSW:[id number] DOB: 04-13-1950 DOA: 03/17/2014 PCP: No primary care provider on file.  Brief narrative:    64 y.o. male with history of hypertension, hyperlipidemia and stroke who presented to [facility] ED with progressive shortness of breath and cough for past 1 week PTA, no associated chest pain, fevers or palpitations.  On admission, pt noted to have BP 206/112. CT angiogram of the chest done did not show PE but did show bilateral pleural effusion and groundglass opacities concerning for edema versus pneumonia. Patient was also found to be bradycardic with at times going into junctional rhythm. Patient's troponin levels were mildly elevated. Patient stated that he has ran out of his antihypertensive meds in past few days. He was started on nitro drip for blood pressure control. Cardiology consulted.   Assessment/Plan:    Principal Problem: Accelerated hypertension - On admission, nitro drip started. Current BP 171/99 while on nitrodrip. - He is on lisinopril 10 mg daily (his only BP med he is usually taking at home). - Added hydralazine PRN if BP remains uncontrolled - Appreciate cardio consult and recommendations  Active Problems:   Elevated troponin / NSTEMI / Bradycardia - Troponin levels 0.08, 0.07; likely demand ischemia from accelerated hypertension - Appreciate cardio consult - Cycle cardiac enzymes, follow up BNP and 2 D ECHO results  - Pt on heparin drip, will follow up cardio recommendations     Pleural effusion, right / ground glass opacities concerning for edema or pneumonia - New small right pleural effusion  - no evidence of fevers, normal WBC count so abx not started on admission     DVT Prophylaxis  - On heparin drip due to troponin elevation   Code Status: Full.  Family Communication:  plan of care discussed with the patient Disposition Plan:  home once BP better controlled, also needs cardio eval.  IV access:  Peripheral IV  Procedures and diagnostic studies:    Dg Chest 2 View 03/17/2014  1. New small right pleural effusion.   Electronically Signed   By: Lucrezia Europe M.D.   On: 03/17/2014 21:15   Ct Angio Chest Pe W/cm &/or Wo Cm 03/18/2014   No evidence of significant pulmonary embolus. Small bilateral pleural effusions. Patchy ground-glass infiltration in the lungs, mostly on the right. This may indicate pneumonia or edema.      Medical Consultants:  Cardiology  Other Consultants:  None   IAnti-Infectives:   None    Leisa Lenz, MD  [facility] Pager [phone]  If 7PM-7AM, please contact night-coverage [email or web address] Password [email or web address] 03/18/2014, 9:21 AM   LOS: 1 day    HPI/Subjective: No acute overnight events.  Objective: Filed Vitals:   03/18/14 0453 03/18/14 0738 03/18/14 0759 03/18/14 0800  BP: 179/106 141/107 171/99 171/99  Pulse: 55  58 65  Temp: 97.5 F (36.4 C)  97.5 F (36.4 C)   TempSrc: Oral  Oral   Resp: 18  23 18   Height:      Weight: 97.1 kg (214 lb 1.1 oz)     SpO2: 99%  92% 100%   No intake or output data in the 24 hours ending 03/18/14 9937  Exam:   General:  Pt is alert, follows commands appropriately, not in acute distress  Cardiovascular: Regular rate and rhythm, S1/S2 (+)  Respiratory: Clear to auscultation bilaterally, no wheezing, no crackles, no rhonchi  Abdomen: Soft,  non tender, non distended, bowel sounds present  Extremities: No edema, pulses DP and PT palpable bilaterally  Neuro: Grossly nonfocal  Data Reviewed: Basic Metabolic Panel:  Recent Labs Lab 03/17/14 2151  NA 135  K 3.6  CL 107  CO2 26  GLUCOSE 102*  BUN 14  CREATININE 0.98  CALCIUM 8.8   Liver Function Tests: No results for input(s): AST, ALT, ALKPHOS, BILITOT, PROT, ALBUMIN in the last 168 hours. No results for input(s): LIPASE, AMYLASE in the last 168 hours. No results for  input(s): AMMONIA in the last 168 hours. CBC:  Recent Labs Lab 03/17/14 2151  WBC 4.6  NEUTROABS 1.1*  HGB 13.7  HCT 41.3  MCV 91.2  PLT 181   Cardiac Enzymes:  Recent Labs Lab 03/17/14 2151 03/18/14 0145  TROPONINI 0.08* 0.07*   BNP: Invalid input(s): POCBNP CBG: No results for input(s): GLUCAP in the last 168 hours.  Recent Results (from the past 240 hour(s))  MRSA PCR Screening     Status: None   Collection Time: 03/18/14  4:46 AM  Result Value Ref Range Status   MRSA by PCR NEGATIVE NEGATIVE Final    Comment:        The GeneXpert MRSA Assay (FDA approved for NASAL specimens only), is one component of a comprehensive MRSA colonization surveillance program. It is not intended to diagnose MRSA infection nor to guide or monitor treatment for MRSA infections.      Scheduled Meds: . aspirin EC  325 mg Oral Daily  . hydrALAZINE  25 mg Oral 4 times per day  . lisinopril  10 mg Oral Daily  . [START ON 03/19/2014] pneumococcal 23 valent vaccine  0.5 mL Intramuscular Tomorrow-1000  . sodium chloride  3 mL Intravenous Q12H   Continuous Infusions: . heparin 1,200 Units/hr (03/18/14 0742)  . nitroGLYCERIN 10 mcg/min (03/18/14 0700)

## 2014-03-18 NOTE — Progress Notes (Signed)
ANTICOAGULATION CONSULT NOTE - Initial Consult  Pharmacy Consult for Heparin Indication: chest pain/ACS  No Known Allergies  Patient Measurements: Height: 6\' 4"  (193 cm) Weight: 214 lb 1.1 oz (97.1 kg) IBW/kg (Calculated) : 86.8  Vital Signs: Temp: 97.5 F (36.4 C) (03/09 0453) Temp Source: Oral (03/09 0453) BP: 179/106 mmHg (03/09 0453) Pulse Rate: 55 (03/09 0453)  Labs:  Recent Labs  03/17/14 2151 03/18/14 0145  HGB 13.7  --   HCT 41.3  --   PLT 181  --   CREATININE 0.98  --   TROPONINI 0.08* 0.07*    Estimated Creatinine Clearance: 92.3 mL/min (by C-G formula based on Cr of 0.98).   Medical History: Past Medical History  Diagnosis Date  . CVA (cerebral infarction)   . Hypertension   . Hyperlipidemia   . Vertigo   . Stroke     Medications:  Prescriptions prior to admission  Medication Sig Dispense Refill Last Dose  . aspirin 81 MG tablet Take 160 mg by mouth daily.     Marland Kitchen lisinopril (PRINIVIL,ZESTRIL) 10 MG tablet Take 1 tablet (10 mg total) by mouth daily. 30 tablet 3     Assessment: 64 y.o. male with SOB/elevated cardiac markers, for heparin Chest CT negative for PE  Goal of Therapy:  Heparin level 0.3-0.7 units/ml Monitor platelets by anticoagulation protocol: Yes   Plan:  Heparin 4000 units IV bolus, then start heparin 1200 units/hr Check heparin level in 6 hours.  Yeny Schmoll, Bronson Curb 03/18/2014,6:47 AM

## 2014-03-18 NOTE — Progress Notes (Signed)
ANTICOAGULATION CONSULT NOTE  Pharmacy Consult for Heparin Indication: chest pain/ACS  No Known Allergies  Labs:  Recent Labs  03/17/14 2151 03/18/14 0145 03/18/14 0815 03/18/14 1220 03/18/14 1400  HGB 13.7  --   --   --   --   HCT 41.3  --   --   --   --   PLT 181  --   --   --   --   HEPARINUNFRC  --   --   --   --  0.16*  CREATININE 0.98  --  0.99  --   --   TROPONINI 0.08* 0.07* 0.06* 0.06*  --     Estimated Creatinine Clearance: 91.3 mL/min (by C-G formula based on Cr of 0.99).   Assessment: 64 y.o. male with SOB/elevated cardiac markers, for heparin Chest CT negative for PE Cath planned for 3/10 Initial heparin level low at 0.16  Goal of Therapy:  Heparin level 0.3-0.7 units/ml Monitor platelets by anticoagulation protocol: Yes   Plan:  Heparin 3000 units IV bolus, then increase heparin 1400 units/hr Check heparin level in 6 hours.   Thank you. Anette Guarneri, PharmD [phone] 03/18/2014,3:30 PM

## 2014-03-19 ENCOUNTER — Telehealth: Payer: Self-pay | Admitting: Nurse Practitioner

## 2014-03-19 ENCOUNTER — Encounter (HOSPITAL_COMMUNITY)
Admission: EM | Disposition: A | Discharge status: Home / Self Care | Payer: Self-pay | Source: Home / Self Care | Attending: Internal Medicine

## 2014-03-19 ENCOUNTER — Encounter (HOSPITAL_COMMUNITY): Payer: Self-pay | Admitting: Nurse Practitioner

## 2014-03-19 DIAGNOSIS — I5022 Chronic systolic (congestive) heart failure: Secondary | ICD-10-CM

## 2014-03-19 DIAGNOSIS — I519 Heart disease, unspecified: Secondary | ICD-10-CM

## 2014-03-19 DIAGNOSIS — I255 Ischemic cardiomyopathy: Secondary | ICD-10-CM

## 2014-03-19 DIAGNOSIS — I441 Atrioventricular block, second degree: Secondary | ICD-10-CM | POA: Insufficient documentation

## 2014-03-19 DIAGNOSIS — I495 Sick sinus syndrome: Secondary | ICD-10-CM

## 2014-03-19 DIAGNOSIS — I2510 Atherosclerotic heart disease of native coronary artery without angina pectoris: Secondary | ICD-10-CM

## 2014-03-19 HISTORY — PX: BI-VENTRICULAR PACEMAKER INSERTION: SHX5462

## 2014-03-19 HISTORY — PX: LEFT HEART CATHETERIZATION WITH CORONARY ANGIOGRAM: SHX5451

## 2014-03-19 LAB — BASIC METABOLIC PANEL
ANION GAP: 5 (ref 5–15)
BUN: 13 mg/dL (ref 6–23)
CO2: 28 mmol/L (ref 19–32)
CREATININE: 0.9 mg/dL (ref 0.50–1.35)
Calcium: 9 mg/dL (ref 8.4–10.5)
Chloride: 105 mmol/L (ref 96–112)
GFR calc Af Amer: >90 mL/min (ref >90)
GFR calc non Af Amer: 87 mL/min — ABNORMAL LOW (ref >90)
Glucose, Bld: 106 mg/dL — ABNORMAL HIGH (ref 70–99)
Potassium: 4 mmol/L (ref 3.5–5.1)
SODIUM: 138 mmol/L (ref 135–145)

## 2014-03-19 LAB — CBC
HCT: 38.6 % — ABNORMAL LOW (ref 39.0–52.0)
HCT: 37.7 % — ABNORMAL LOW (ref 39.0–52.0)
Hemoglobin: 12.8 g/dL — ABNORMAL LOW (ref 13.0–17.0)
Hemoglobin: 13.2 g/dL (ref 13.0–17.0)
MCH: 30.4 pg (ref 26.0–34.0)
MCH: 31.1 pg (ref 26.0–34.0)
MCHC: 33.2 g/dL (ref 30.0–36.0)
MCHC: 35 g/dL (ref 30.0–36.0)
MCV: 91.7 fL (ref 78.0–100.0)
MCV: 88.9 fL (ref 78.0–100.0)
Platelets: 187 10*3/uL (ref 150–400)
Platelets: 174 10*3/uL (ref 150–400)
RBC: 4.21 MIL/uL — ABNORMAL LOW (ref 4.22–5.81)
RBC: 4.24 MIL/uL (ref 4.22–5.81)
RDW: 13.9 % (ref 11.5–15.5)
RDW: 13.7 % (ref 11.5–15.5)
WBC: 4.3 10*3/uL (ref 4.0–10.5)
WBC: 4.1 10*3/uL (ref 4.0–10.5)

## 2014-03-19 LAB — CREATININE, SERUM
Creatinine, Ser: 0.91 mg/dL (ref 0.50–1.35)
GFR calc non Af Amer: 87 mL/min — ABNORMAL LOW (ref >90)

## 2014-03-19 LAB — BRAIN NATRIURETIC PEPTIDE: B NATRIURETIC PEPTIDE 5: 247.6 pg/mL — AB (ref 0.0–100.0)

## 2014-03-19 LAB — SURGICAL PCR SCREEN
MRSA, PCR: NEGATIVE
Staphylococcus aureus: NEGATIVE

## 2014-03-19 LAB — HEPARIN LEVEL (UNFRACTIONATED): Heparin Unfractionated: 0.4 IU/mL (ref 0.30–0.70)

## 2014-03-19 LAB — PROTIME-INR
INR: 1.19 (ref 0.00–1.49)
PROTHROMBIN TIME: 15.3 s — AB (ref 11.6–15.2)

## 2014-03-19 SURGERY — LEFT HEART CATHETERIZATION WITH CORONARY ANGIOGRAM
Anesthesia: LOCAL

## 2014-03-19 SURGERY — BI-VENTRICULAR PACEMAKER INSERTION (CRT-P)
Anesthesia: LOCAL

## 2014-03-19 MED ORDER — HYDROCODONE-ACETAMINOPHEN 5-325 MG PO TABS
1.0000 | ORAL_TABLET | ORAL | Status: DC | PRN
  Administered 2014-03-19: 22:00:00 2 via ORAL
  Administered 2014-03-20 (×2): 1 via ORAL
  Administered 2014-03-20: 04:00:00 2 via ORAL
  Filled 2014-03-19 (×2): qty 1
  Filled 2014-03-19 (×2): qty 2

## 2014-03-19 MED ORDER — CARVEDILOL 3.125 MG PO TABS
6.2500 mg | ORAL_TABLET | Freq: Two times a day (BID) | ORAL | Status: DC
  Administered 2014-03-19 – 2014-03-20 (×2): 6.25 mg via ORAL
  Filled 2014-03-19 (×2): qty 2

## 2014-03-19 MED ORDER — HEPARIN (PORCINE) IN NACL 2-0.9 UNIT/ML-% IJ SOLN
INTRAMUSCULAR | Status: AC
  Filled 2014-03-19: qty 500

## 2014-03-19 MED ORDER — HEPARIN SODIUM (PORCINE) 1000 UNIT/ML IJ SOLN
INTRAMUSCULAR | Status: AC
  Filled 2014-03-19: qty 1

## 2014-03-19 MED ORDER — SODIUM CHLORIDE 0.9 % IV SOLN: 250.0000 mL | INTRAVENOUS | Status: DC | PRN

## 2014-03-19 MED ORDER — ONDANSETRON HCL 4 MG/2ML IJ SOLN: 4.0000 mg | Freq: Four times a day (QID) | INTRAMUSCULAR | Status: DC | PRN

## 2014-03-19 MED ORDER — HEPARIN (PORCINE) IN NACL 2-0.9 UNIT/ML-% IJ SOLN
INTRAMUSCULAR | Status: AC
  Filled 2014-03-19: qty 1000

## 2014-03-19 MED ORDER — MIDAZOLAM HCL 2 MG/2ML IJ SOLN
INTRAMUSCULAR | Status: AC
  Filled 2014-03-19: qty 2

## 2014-03-19 MED ORDER — SODIUM CHLORIDE 0.9 % IR SOLN
80.0000 mg | Status: DC
  Filled 2014-03-19 (×2): qty 2

## 2014-03-19 MED ORDER — CEFAZOLIN SODIUM-DEXTROSE 2-3 GM-% IV SOLR
2.0000 g | INTRAVENOUS | Status: DC
Start: 2014-03-19 — End: 2014-03-19
  Filled 2014-03-19 (×2): qty 50

## 2014-03-19 MED ORDER — ACETAMINOPHEN 325 MG PO TABS
325.0000 mg | ORAL_TABLET | ORAL | Status: DC | PRN
  Administered 2014-03-19: 650 mg via ORAL
  Filled 2014-03-19: qty 2

## 2014-03-19 MED ORDER — SODIUM CHLORIDE 0.9 % IV SOLN
INTRAVENOUS | Status: DC
  Administered 2014-03-19: 06:00:00 via INTRAVENOUS

## 2014-03-19 MED ORDER — AMLODIPINE BESYLATE 5 MG PO TABS
5.0000 mg | ORAL_TABLET | Freq: Once | ORAL | Status: AC
  Administered 2014-03-19: 20:00:00 5 mg via ORAL
  Filled 2014-03-19: qty 1

## 2014-03-19 MED ORDER — ASPIRIN EC 81 MG PO TBEC: 81.0000 mg | DELAYED_RELEASE_TABLET | Freq: Every day | ORAL | Status: DC

## 2014-03-19 MED ORDER — FENTANYL CITRATE 0.05 MG/ML IJ SOLN
INTRAMUSCULAR | Status: AC
  Filled 2014-03-19: qty 2

## 2014-03-19 MED ORDER — LIDOCAINE HCL (PF) 1 % IJ SOLN
INTRAMUSCULAR | Status: AC
  Filled 2014-03-19: qty 30

## 2014-03-19 MED ORDER — SODIUM CHLORIDE 0.9 % IJ SOLN: 3.0000 mL | INTRAMUSCULAR | Status: DC | PRN

## 2014-03-19 MED ORDER — ASPIRIN 81 MG PO CHEW
81.0000 mg | CHEWABLE_TABLET | ORAL | Status: AC
  Administered 2014-03-19: 81 mg via ORAL
  Filled 2014-03-19: qty 1

## 2014-03-19 MED ORDER — SODIUM CHLORIDE 0.9 % IV SOLN
250.0000 mL | INTRAVENOUS | Status: DC | PRN
Start: 2014-03-19 — End: 2014-03-20

## 2014-03-19 MED ORDER — HEPARIN SODIUM (PORCINE) 5000 UNIT/ML IJ SOLN: 5000.0000 [IU] | Freq: Three times a day (TID) | INTRAMUSCULAR | Status: DC

## 2014-03-19 MED ORDER — SODIUM CHLORIDE 0.9 % IJ SOLN
3.0000 mL | Freq: Two times a day (BID) | INTRAMUSCULAR | Status: DC
  Administered 2014-03-19 – 2014-03-20 (×2): 3 mL via INTRAVENOUS

## 2014-03-19 MED ORDER — SODIUM CHLORIDE 0.9 % IV SOLN
INTRAVENOUS | Status: DC
  Administered 2014-03-19: 100 mL/h via INTRAVENOUS

## 2014-03-19 MED ORDER — CEFAZOLIN SODIUM 1-5 GM-% IV SOLN
1.0000 g | Freq: Four times a day (QID) | INTRAVENOUS | Status: AC
  Administered 2014-03-19 – 2014-03-20 (×3): 1 g via INTRAVENOUS
  Filled 2014-03-19 (×3): qty 50

## 2014-03-19 MED ORDER — ACETAMINOPHEN 325 MG PO TABS
650.0000 mg | ORAL_TABLET | ORAL | Status: DC | PRN
  Administered 2014-03-19: 13:00:00 650 mg via ORAL
  Filled 2014-03-19: qty 2

## 2014-03-19 MED ORDER — CHLORHEXIDINE GLUCONATE 4 % EX LIQD
60.0000 mL | Freq: Once | CUTANEOUS | Status: DC
Start: 2014-03-19 — End: 2014-03-19
  Filled 2014-03-19: qty 15

## 2014-03-19 MED ORDER — SODIUM CHLORIDE 0.9 % IJ SOLN: 3.0000 mL | Freq: Two times a day (BID) | INTRAMUSCULAR | Status: DC

## 2014-03-19 MED ORDER — YOU HAVE A PACEMAKER BOOK
Freq: Once | Status: AC
  Administered 2014-03-19: 22:00:00
  Filled 2014-03-19: qty 1

## 2014-03-19 MED ORDER — AMLODIPINE BESYLATE 10 MG PO TABS
10.0000 mg | ORAL_TABLET | Freq: Every day | ORAL | Status: DC
  Administered 2014-03-20: 09:00:00 10 mg via ORAL
  Filled 2014-03-19: qty 1

## 2014-03-19 MED ORDER — LISINOPRIL 20 MG PO TABS
20.0000 mg | ORAL_TABLET | Freq: Every day | ORAL | Status: DC
  Administered 2014-03-20: 20 mg via ORAL
  Filled 2014-03-19: qty 1

## 2014-03-19 MED ORDER — VERAPAMIL HCL 2.5 MG/ML IV SOLN
INTRAVENOUS | Status: AC
  Filled 2014-03-19: qty 2

## 2014-03-19 MED ORDER — CHLORHEXIDINE GLUCONATE 4 % EX LIQD
60.0000 mL | Freq: Once | CUTANEOUS | Status: DC
Start: 2014-03-19 — End: 2014-03-19
  Filled 2014-03-19: qty 45

## 2014-03-19 MED ORDER — LISINOPRIL 10 MG PO TABS
10.0000 mg | ORAL_TABLET | Freq: Once | ORAL | Status: AC
  Administered 2014-03-19: 20:00:00 10 mg via ORAL
  Filled 2014-03-19: qty 1

## 2014-03-19 MED ORDER — MIDAZOLAM HCL 5 MG/5ML IJ SOLN
INTRAMUSCULAR | Status: AC
Start: 2014-03-19 — End: 2014-03-19
  Filled 2014-03-19: qty 5

## 2014-03-19 NOTE — Progress Notes (Signed)
TR BAND REMOVAL  LOCATION:    right radial  DEFLATED PER PROTOCOL:    Yes.    TIME BAND OFF / DRESSING APPLIED:    1330   SITE UPON ARRIVAL:    Level 0  SITE AFTER BAND REMOVAL:    Level 0  REVERSE ALLEN'S TEST:     positive  CIRCULATION SENSATION AND MOVEMENT:    Within Normal Limits   Yes.    COMMENTS:   Tolerated procedure well

## 2014-03-19 NOTE — H&P (View-Only) (Signed)
ELECTROPHYSIOLOGY CONSULT NOTE    Patient ID: Phillip Booth MRN: [id number], DOB/AGE: 1950-09-05 64 y.o.  Admit date: 03/17/2014 Date of Consult: 03/19/2014  Primary Physician: No primary care provider on file. Primary Cardiologist: new to [facility]  Reason for Consultation: bradycardia  HPI:  Phillip Booth is a 64 y.o. male with a past medical history significant for hypertension, hyperlipidemia, and prior CVA who was admitted on 03-17-14 with exertional fatigue and shortness of breath associated with chest tightness.  His troponin was slightly elevated and he underwent cardiac catheterization today which demonstrated no targets for revascularization with medical therapy recommended (note pending).  EP has been asked to evaluate for bradycardia as a cause for symptoms.  He reports that for the last few months he has been having exercise intolerance that has become more persistent over time.  This has been associated with shortness of breath and chest tightness.  He has not had syncope or pre-syncope.    Echocardiogram this admission demonstrated EF 45-50%, mild hypokinesis of anteroseptal mycardium, PA pressure 42, LA 34.  Lab work is reviewed.   He has been off Lisinopril for approximately 1 week prior to admission.  He has been on no AV nodal blocking agents.  Past Medical History  Diagnosis Date  . CVA (cerebral infarction)     a. diagnosis not clear  . Hypertension   . Hyperlipidemia   . Vertigo      Surgical History:  Past Surgical History  Procedure Laterality Date  . Abdominal surgery    . Hernia repair    . Appendectomy       Prescriptions prior to admission  Medication Sig Dispense Refill Last Dose  . aspirin 81 MG tablet Take 162 mg by mouth daily.    Past Week at Unknown time  . lisinopril (PRINIVIL,ZESTRIL) 10 MG tablet Take 1 tablet (10 mg total) by mouth daily. 30 tablet 3 Past Week at Unknown time    Inpatient Medications:  . amLODipine  5 mg Oral  Daily  . aspirin EC  325 mg Oral Daily  . heparin  5,000 Units Subcutaneous 3 times per day  . lisinopril  10 mg Oral Daily  . pneumococcal 23 valent vaccine  0.5 mL Intramuscular Tomorrow-1000  . sodium chloride  3 mL Intravenous Q12H    Allergies: No Known Allergies  History   Social History  . Marital Status: Married    Spouse Name: N/A  . Number of Children: N/A  . Years of Education: N/A   Occupational History  . Not on file.   Social History Main Topics  . Smoking status: Never Smoker   . Smokeless tobacco: Never Used  . Alcohol Use: Yes     Comment: occasional  . Drug Use: No  . Sexual Activity: Not on file   Other Topics Concern  . Not on file   Social History Narrative     Family History  Problem Relation Age of Onset  . Hypertension Mother   . Hypertension Father      Review of Systems: General: No chills, fever, night sweats or weight changes  Cardiovascular:  No edema, orthopnea, palpitations, paroxysmal nocturnal dyspnea Dermatological: No rash, lesions or masses Respiratory: No cough, dyspnea Urologic: No hematuria, dysuria Abdominal: No nausea, vomiting, diarrhea, bright red blood per rectum, melena, or hematemesis Neurologic: No visual changes, weakness, changes in mental status All other systems reviewed and are otherwise negative except as noted above.  Physical Exam: Filed Vitals:  03/19/14 1100 03/19/14 1105 03/19/14 1110 03/19/14 1115  BP:  173/93 155/81 154/84  Pulse: 42 50 46 47  Temp:    97.8 F (36.6 C)  TempSrc:    Oral  Resp: 28 17 20 17   Height:      Weight:      SpO2: 100% 98% 97% 97%    GEN- The patient is well appearing, alert and oriented x 3 today.   HEENT: normocephalic, atraumatic; sclera clear, conjunctiva pink; hearing intact; oropharynx clear; neck supple, no JVP Lymph- no cervical lymphadenopathy Lungs- Clear to ausculation bilaterally, normal work of breathing.  No wheezes, rales, rhonchi Heart- Regular rate  and rhythm, 2/6 SEM   GI- obese, soft, non-tender, non-distended, bowel sounds present, no hepatosplenomegaly Extremities- no clubbing, cyanosis, or edema; DP/PT/radial pulses 2+ bilaterally MS- no significant deformity or atrophy Skin- warm and dry, no rash or lesion Psych- euthymic mood, full affect Neuro- strength and sensation are intact  Labs:   Lab Results  Component Value Date   WBC 4.1 03/19/2014   HGB 13.2 03/19/2014   HCT 37.7* 03/19/2014   MCV 88.9 03/19/2014   PLT 174 03/19/2014     Recent Labs Lab 03/18/14 0815 03/19/14 0606 03/19/14 1226  NA 138 138  --   K 3.9 4.0  --   CL 105 105  --   CO2 28 28  --   BUN 11 13  --   CREATININE 0.99 0.90 0.91  CALCIUM 9.2 9.0  --   PROT 7.2  --   --   BILITOT 1.7*  --   --   ALKPHOS 53  --   --   ALT 40  --   --   AST 33  --   --   GLUCOSE 94 106*  --       Radiology/Studies: Dg Chest 2 View 03/17/2014   CLINICAL DATA:  Cough, head/chest congestion and feeling like he is hyperventilating x 1 month; PMH of HTN; never smoked  EXAM: CHEST - 2 VIEW  COMPARISON:  10/13/2009  FINDINGS: Small right pleural effusion. Heart size upper limits normal. Prominent right perihilar interstitial markings. No confluent airspace consolidation or overt edema. Pneumothorax. Visualized skeletal structures are unremarkable.  IMPRESSION: 1. New small right pleural effusion.   Electronically Signed   By: Lucrezia Europe M.D.   On: 03/17/2014 21:15   Ct Angio Chest Pe W/cm &/or Wo Cm 03/18/2014   CLINICAL DATA:  Chest pain, shortness of breath, cough, and head congestion. Hyperventilated for 1 month.  EXAM: CT ANGIOGRAPHY CHEST WITH CONTRAST  TECHNIQUE: Multidetector CT imaging of the chest was performed using the standard protocol during bolus administration of intravenous contrast. Multiplanar CT image reconstructions and MIPs were obtained to evaluate the vascular anatomy.  CONTRAST:  122mL OMNIPAQUE IOHEXOL 350 MG/ML SOLN  COMPARISON:  None.  FINDINGS:  Technically adequate study with good opacification of the central and segmental pulmonary arteries. No focal filling defects demonstrated. No evidence of significant pulmonary embolus.  Normal heart size. Normal caliber thoracic aorta. Coronary artery and aortic calcifications. Esophagus is decompressed. Mediastinal and hilar lymph nodes are upper limits of normal, possibly reactive. Small bilateral pleural effusions with bilateral basilar atelectasis. Vague patchy ground-glass infiltration scattered throughout both lungs but more prominent in the right middle lung. This may represent multifocal pneumonia or edema. Airways appear patent. No pneumothorax.  Included portions of the upper abdominal organs are grossly unremarkable.  Review of the MIP images confirms the above  findings.  IMPRESSION: No evidence of significant pulmonary embolus. Small bilateral pleural effusions. Patchy ground-glass infiltration in the lungs, mostly on the right. This may indicate pneumonia or edema.   Electronically Signed   By: Lucienne Capers M.D.   On: 03/18/2014 00:16    BMS:[id number] bradycardia, 1st degree AV block, PR 432, LVH  TELEMETRY: sinus bradycardia, junctional rhythm  Assessment/Plan: 1.  Symptomatic bradycardia The patient has symptomatic bradycardia with no reversible causes.  Catheterization today demonstrated no targets for revascularization.  Pacemaker implantation is recommended at this time. EF 45-50% by echo this admission, the patient has a high likelihood of RV pacing with underlying conduction system disease.  Risks, benefits, and alternatives to CRTP implant were reviewed with the patient who wished to proceed.   2. HTN BP remains elevated Continue to optimize medications after pacemaker placement  3.  CAD Cath this admission with no targets for revascularization (cath note pending) Add beta blocker after pacemaker implanted.    Signed, Chanetta Marshall, NP 03/19/2014 1:47 PM  I have seen,  examined the patient, and reviewed the above assessment and plan.  Changes to above are made where necessary.  On exam, alert and ambulatory.  He has symptomatic sinus bradycardia as well as second degree AV block.  He has EF < 50% and is anticipated to V pace > 40%.    I would therefore recommend biventricular pacemaker implantation at this time.  Risks, benefits, alternatives to pacemaker implantation were discussed in detail with the patient today. The patient understands that the risks include but are not limited to bleeding, infection, pneumothorax, perforation, tamponade, vascular damage, renal failure, MI, stroke, death,  and lead dislodgement and wishes to proceed at this time.  Co Sign: [name], MD 03/19/2014 4:03 PM

## 2014-03-19 NOTE — Progress Notes (Signed)
EKG complete.  DR Claiborne Billings in to talk to family about the results of test of plan of care.  Pt to [place] via bed

## 2014-03-19 NOTE — Consult Note (Signed)
ELECTROPHYSIOLOGY CONSULT NOTE    Patient ID: Phillip Booth MRN: [id number], DOB/AGE: 1950/11/13 64 y.o.  Admit date: 03/17/2014 Date of Consult: 03/19/2014  Primary Physician: No primary care provider on file. Primary Cardiologist: new to [facility]  Reason for Consultation: bradycardia  HPI:  Phillip Booth is a 64 y.o. male with a past medical history significant for hypertension, hyperlipidemia, and prior CVA who was admitted on 03-17-14 with exertional fatigue and shortness of breath associated with chest tightness.  His troponin was slightly elevated and he underwent cardiac catheterization today which demonstrated no targets for revascularization with medical therapy recommended (note pending).  EP has been asked to evaluate for bradycardia as a cause for symptoms.  He reports that for the last few months he has been having exercise intolerance that has become more persistent over time.  This has been associated with shortness of breath and chest tightness.  He has not had syncope or pre-syncope.    Echocardiogram this admission demonstrated EF 45-50%, mild hypokinesis of anteroseptal mycardium, PA pressure 42, LA 34.  Lab work is reviewed.   He has been off Lisinopril for approximately 1 week prior to admission.  He has been on no AV nodal blocking agents.  Past Medical History  Diagnosis Date  . CVA (cerebral infarction)     a. diagnosis not clear  . Hypertension   . Hyperlipidemia   . Vertigo      Surgical History:  Past Surgical History  Procedure Laterality Date  . Abdominal surgery    . Hernia repair    . Appendectomy       Prescriptions prior to admission  Medication Sig Dispense Refill Last Dose  . aspirin 81 MG tablet Take 162 mg by mouth daily.    Past Week at Unknown time  . lisinopril (PRINIVIL,ZESTRIL) 10 MG tablet Take 1 tablet (10 mg total) by mouth daily. 30 tablet 3 Past Week at Unknown time    Inpatient Medications:  . amLODipine  5 mg Oral  Daily  . aspirin EC  325 mg Oral Daily  . heparin  5,000 Units Subcutaneous 3 times per day  . lisinopril  10 mg Oral Daily  . pneumococcal 23 valent vaccine  0.5 mL Intramuscular Tomorrow-1000  . sodium chloride  3 mL Intravenous Q12H    Allergies: No Known Allergies  History   Social History  . Marital Status: Married    Spouse Name: N/A  . Number of Children: N/A  . Years of Education: N/A   Occupational History  . Not on file.   Social History Main Topics  . Smoking status: Never Smoker   . Smokeless tobacco: Never Used  . Alcohol Use: Yes     Comment: occasional  . Drug Use: No  . Sexual Activity: Not on file   Other Topics Concern  . Not on file   Social History Narrative     Family History  Problem Relation Age of Onset  . Hypertension Mother   . Hypertension Father      Review of Systems: General: No chills, fever, night sweats or weight changes  Cardiovascular:  No edema, orthopnea, palpitations, paroxysmal nocturnal dyspnea Dermatological: No rash, lesions or masses Respiratory: No cough, dyspnea Urologic: No hematuria, dysuria Abdominal: No nausea, vomiting, diarrhea, bright red blood per rectum, melena, or hematemesis Neurologic: No visual changes, weakness, changes in mental status All other systems reviewed and are otherwise negative except as noted above.  Physical Exam: Filed Vitals:  03/19/14 1100 03/19/14 1105 03/19/14 1110 03/19/14 1115  BP:  173/93 155/81 154/84  Pulse: 42 50 46 47  Temp:    97.8 F (36.6 C)  TempSrc:    Oral  Resp: 28 17 20 17   Height:      Weight:      SpO2: 100% 98% 97% 97%    GEN- The patient is well appearing, alert and oriented x 3 today.   HEENT: normocephalic, atraumatic; sclera clear, conjunctiva pink; hearing intact; oropharynx clear; neck supple, no JVP Lymph- no cervical lymphadenopathy Lungs- Clear to ausculation bilaterally, normal work of breathing.  No wheezes, rales, rhonchi Heart- Regular rate  and rhythm, 2/6 SEM   GI- obese, soft, non-tender, non-distended, bowel sounds present, no hepatosplenomegaly Extremities- no clubbing, cyanosis, or edema; DP/PT/radial pulses 2+ bilaterally MS- no significant deformity or atrophy Skin- warm and dry, no rash or lesion Psych- euthymic mood, full affect Neuro- strength and sensation are intact  Labs:   Lab Results  Component Value Date   WBC 4.1 03/19/2014   HGB 13.2 03/19/2014   HCT 37.7* 03/19/2014   MCV 88.9 03/19/2014   PLT 174 03/19/2014     Recent Labs Lab 03/18/14 0815 03/19/14 0606 03/19/14 1226  NA 138 138  --   K 3.9 4.0  --   CL 105 105  --   CO2 28 28  --   BUN 11 13  --   CREATININE 0.99 0.90 0.91  CALCIUM 9.2 9.0  --   PROT 7.2  --   --   BILITOT 1.7*  --   --   ALKPHOS 53  --   --   ALT 40  --   --   AST 33  --   --   GLUCOSE 94 106*  --       Radiology/Studies: Dg Chest 2 View 03/17/2014   CLINICAL DATA:  Cough, head/chest congestion and feeling like he is hyperventilating x 1 month; PMH of HTN; never smoked  EXAM: CHEST - 2 VIEW  COMPARISON:  10/13/2009  FINDINGS: Small right pleural effusion. Heart size upper limits normal. Prominent right perihilar interstitial markings. No confluent airspace consolidation or overt edema. Pneumothorax. Visualized skeletal structures are unremarkable.  IMPRESSION: 1. New small right pleural effusion.   Electronically Signed   By: Lucrezia Europe M.D.   On: 03/17/2014 21:15   Ct Angio Chest Pe W/cm &/or Wo Cm 03/18/2014   CLINICAL DATA:  Chest pain, shortness of breath, cough, and head congestion. Hyperventilated for 1 month.  EXAM: CT ANGIOGRAPHY CHEST WITH CONTRAST  TECHNIQUE: Multidetector CT imaging of the chest was performed using the standard protocol during bolus administration of intravenous contrast. Multiplanar CT image reconstructions and MIPs were obtained to evaluate the vascular anatomy.  CONTRAST:  177mL OMNIPAQUE IOHEXOL 350 MG/ML SOLN  COMPARISON:  None.  FINDINGS:  Technically adequate study with good opacification of the central and segmental pulmonary arteries. No focal filling defects demonstrated. No evidence of significant pulmonary embolus.  Normal heart size. Normal caliber thoracic aorta. Coronary artery and aortic calcifications. Esophagus is decompressed. Mediastinal and hilar lymph nodes are upper limits of normal, possibly reactive. Small bilateral pleural effusions with bilateral basilar atelectasis. Vague patchy ground-glass infiltration scattered throughout both lungs but more prominent in the right middle lung. This may represent multifocal pneumonia or edema. Airways appear patent. No pneumothorax.  Included portions of the upper abdominal organs are grossly unremarkable.  Review of the MIP images confirms the above  findings.  IMPRESSION: No evidence of significant pulmonary embolus. Small bilateral pleural effusions. Patchy ground-glass infiltration in the lungs, mostly on the right. This may indicate pneumonia or edema.   Electronically Signed   By: Lucienne Capers M.D.   On: 03/18/2014 00:16    DZH:GDJME bradycardia, 1st degree AV block, PR 432, LVH  TELEMETRY: sinus bradycardia, junctional rhythm  Assessment/Plan: 1.  Symptomatic bradycardia The patient has symptomatic bradycardia with no reversible causes.  Catheterization today demonstrated no targets for revascularization.  Pacemaker implantation is recommended at this time. EF 45-50% by echo this admission, the patient has a high likelihood of RV pacing with underlying conduction system disease.  Risks, benefits, and alternatives to CRTP implant were reviewed with the patient who wished to proceed.   2. HTN BP remains elevated Continue to optimize medications after pacemaker placement  3.  CAD Cath this admission with no targets for revascularization (cath note pending) Add beta blocker after pacemaker implanted.    Signed, Chanetta Marshall, NP 03/19/2014 1:47 PM  I have seen,  examined the patient, and reviewed the above assessment and plan.  Changes to above are made where necessary.  On exam, alert and ambulatory.  He has symptomatic sinus bradycardia as well as second degree AV block.  He has EF < 50% and is anticipated to V pace > 40%.    I would therefore recommend biventricular pacemaker implantation at this time.  Risks, benefits, alternatives to pacemaker implantation were discussed in detail with the patient today. The patient understands that the risks include but are not limited to bleeding, infection, pneumothorax, perforation, tamponade, vascular damage, renal failure, MI, stroke, death,  and lead dislodgement and wishes to proceed at this time.  Co Sign: [name], MD 03/19/2014 4:03 PM

## 2014-03-19 NOTE — Op Note (Signed)
SURGEON:  [name], MD      PREPROCEDURE DIAGNOSES:   1. Sick sinus syndrome.   2. Symptomatic second degree AV block  3. Ischemic Cardiomyopathy (EF 45%)  4. New York Heart Association class III, heart failure chronically.      POSTPROCEDURE DIAGNOSES:   1. Sick sinus syndrome.   2. Symptomatic second degree AV block  3. Ischemic Cardiomyopathy (EF 45%)  4. New York Heart Association class III, heart failure chronically.      PROCEDURES:    1.  Biventricular pacemaker implantation.     INTRODUCTION:  Phillip Booth is a 64 y.o. male with symptomatic sinus bradycardia/ sick sinus syndrome, symptomatic second degree AV block, an ischemic CM (EF 45%), and NYHA Class III CHF.Marland Kitchen At this time, he meets criteria for pacemaker implantation.  He has a very prolonged first degree AV block and frequent 2:1 AV block which will require V pacing >40% of the time, the patient may also be expected to benefit from resynchronization therapy.     he therefore  presents today for a biventricular pacemaker implantation.      DESCRIPTION OF PROCEDURE:  Informed written consent was obtained and the patient was brought to the electrophysiology lab in the fasting state. The patient was adequately sedated with intravenous Versed, and fentanyl as outlined in the nursing report.  The patient's left chest was prepped and draped in the usual sterile fashion by the EP lab staff.  The skin overlying the left deltopectoral region was infiltrated with lidocaine for local analgesia.  A 5-cm incision was made over the left deltopectoral region.  A left subcutaneous defibrillator pocket was fashioned using a combination of sharp and blunt dissection.  Electrocautery was used to assure hemostasis.   RA/RV Lead Placement: The left axillary vein was cannulated with fluoroscopic visualization.  No contrast was required for this endeavor.  Through the left axillary vein, a [employer]. Jude Medical Tendril STS, model [phone]  (serial #  [id number]) right atrial lead and a St. Jude Isoflex, model 1948-58 (serial number [id number]) right ventricular lead were advanced with fluoroscopic visualization into the right atrial appendage and right ventricular apex positions respectively.  The patient had some transient chest pain and therefore both leads were repositioned.  He reports that his pain proceeded lead placement.  Initial atrial lead P-waves measured 1.6 mV with an impedance of 536 ohms and a threshold of 1.1 volts at 0.5 milliseconds.  The right ventricular lead R-wave measured 10 mV with impedance of 828 ohms and a threshold of 0.5 volts at 0.5 milliseconds.   LV Lead Placement: A Medtronic MB-2 guide was advanced through the left axillary vein into the low lateral right atrium.  A Bard curved Damato catheter was introduced through the MB-2 guide and used to cannulate the coronary sinus.   A nonselective coronary sinus venogram was performed by hand injection of nonionic contrast.  This demonstrated a moderate sized lateral coronary sinus branch.  No other posterior branches were identified.   A Whisper CSJ wire was introduced through the sheath and advanced into the lateral branch.  A sub selecting catheter was required to engage the vessel.  [place] [id number] [phone] - [id number] (serial number [id number]) lead was advanced through the MB-2 into the lateral branch.   This was  approximately one-thirds from the base to the apex in a very lateral position.  In this location, the left ventricular lead R-waves measured  26 mV with impedance of 678  ohms and a threshold of 0.7 volt at 0.5  milliseconds in the M3-D4 bipolar configuration with no diaphragmatic  stimulation observed when pacing at 10 volts output.  The MB-2 guide was  therefore removed.     All three leads were secured to the pectoralis  fascia using #2 silk suture over the suture sleeves.  The pocket then irrigated with copious gentamicin solution.  The leads were then   connected to a [place] RF model [id number] (serial  Number [id number]) biventricular pacemaker .  The pacemaker was placed into the  pocket.  The pocket was then closed in 2 layers with 2.0 Vicryl suture  for the subcutaneous and subcuticular layers. EBL<48ml.  Steri-Strips and a  sterile dressing were then applied.  There were no early apparent complications.     CONCLUSIONS:   1. Symptomatic sick sinus syndrome and second degree AV block with an ischemic cardiomyopathy (EF 45%) and chronic New York Heart Association class III heart failure.   2. Successful biventricular pacemaker implantation.   3. No early apparent complications.   [name] MD 03/19/2014 6:26 PM

## 2014-03-19 NOTE — H&P (View-Only) (Signed)
Cardiology Consultation  Phillip Booth    [id number] August 26, 1950  Reason for Consult:  Mildly positive troponin  Requesting Physician: [facility]  Primary Cardiologist: Dr Gerlene Burdock  HPI: Phillip Booth is a 64 year old African-American male who has a history of hypertension, and hyperlipidemia.  He states over the years he has been intermittently taking blood pressure medication.  Most recently he has been on lisinopril but has run out of this approximately 4 days ago.  He also was taking simvastatin.  He works as a Biomedical engineer.  Over the past 6 months he has noticed a definite change in his ability to walk.  He admits to exertional shortness of breath.  He also has noticed that if he walks fast he develops a tightness in his chest.  He has notices most when he has a short time on his bus where he can have a bathroom break.  For this reason, he walks fast, but oftentimes once he reaches the restroom, he has to stop because of tightness and shortness of breath.  The past several days.  He is not taking his medications.  He was transferred from [facility] with accelerated hypertension.  He also has been noted to have bradycardia arrhythmia with transient junctional rhythm, bradycardia, and first-degree heart block.  A CT angiogram did not show evidence for pulmonary embolism but suggested small bilateral pleural effusions and groundglass opacities concerning for edema versus pneumonia. Cardiology consultation is now requested. Cardiac risk factors are notable for male sex greater than age 62, hypertension, hyperlipidemia, and family history. The patient had an episode of vertigo in 2009, but states he was ultimately told that he did not have a stroke   Past Medical History  Diagnosis Date  . CVA (cerebral infarction)   . Hypertension   . Hyperlipidemia   . Vertigo   . Stroke    Past Surgical History  Procedure Laterality Date  . Abdominal surgery    .  Hernia repair    . Appendectomy      FAMHx: Family History  Problem Relation Age of Onset  . Hypertension Mother   . Hypertension Father    Additional family history is notable that mother died at 64 and had an enlarged heart.  Father died at age 56 and had heart disease and hypertension.  SOCHx:  reports that he has never smoked. He has never used smokeless tobacco. He reports that he drinks alcohol. He reports that he does not use illicit drugs.Marland Kitchen  He currently works as a Copywriter, advertising.  He has one stepchild.  ALLERGIES: No Known Allergies   HOME MEDICATIONS: Prescriptions prior to admission  Medication Sig Dispense Refill Last Dose  . aspirin 81 MG tablet Take 162 mg by mouth daily.    Past Week at Unknown time  . lisinopril (PRINIVIL,ZESTRIL) 10 MG tablet Take 1 tablet (10 mg total) by mouth daily. 30 tablet 3 Past Week at Unknown time    HOSPITAL MEDICATIONS: . aspirin EC  325 mg Oral Daily  . lisinopril  10 mg Oral Daily  . [START ON 03/19/2014] pneumococcal 23 valent vaccine  0.5 mL Intramuscular Tomorrow-1000  . sodium chloride  3 mL Intravenous Q12H   . heparin 1,200 Units/hr (03/18/14 0742)  . nitroGLYCERIN 10 mcg/min (03/18/14 0700)   ROS General: Negative; No fevers, chills, or night sweats;  HEENT: Negative; No changes in vision or hearing, sinus congestion, difficulty swallowing Pulmonary: Recent increasing congestion for which he has  taken Robitussin for cough Cardiovascular: See history of present illness GI: Negative; No nausea, vomiting, diarrhea, or abdominal pain GU: Negative; No dysuria, hematuria, or difficulty voiding Musculoskeletal: Negative; no myalgias, joint pain, or weakness Hematologic/Oncology: Negative; no easy bruising, bleeding Endocrine: Negative; no heat/cold intolerance; no diabetes Neuro: Remote history of vertigo Skin: Negative; No rashes or skin lesions Psychiatric: Negative; No behavioral problems, depression Sleep:  Negative; No snoring, daytime sleepiness, hypersomnolence, bruxism, restless legs, hypnogognic hallucinations, no cataplexy Other comprehensive 14 point system review is negative.   VITALS: Blood pressure 171/99, pulse 65, temperature 97.5 F (36.4 C), temperature source Oral, resp. rate 18, height _0  (1.93 m), weight 214 lb 1.1 oz (97.1 kg), SpO2 100 %.  PHYSICAL EXAM: General appearance: alert, cooperative and no distress Neck: no adenopathy, no carotid bruit, no JVD, supple, symmetrical, trachea midline and thyroid not enlarged, symmetric, no tenderness/mass/nodules Lungs: clear to auscultation bilaterally Heart: regular rate and rhythm and 2/6 systolic murmur.  No S3 gallop.  No diastolic murmur, rub, thrills or heaves Abdomen: soft, non-tender; bowel sounds normal; no masses,  no organomegaly Extremities: extremities normal, atraumatic, no cyanosis or edema Pulses: 2+ and symmetric Skin: Skin color, texture, turgor normal. No rashes or lesions Neurologic: Grossly normal  ECG (independently read by me): Sinus bradycardia with first-degree AV block with PR interval 366 ms  LABS: Results for orders placed or performed during the hospital encounter of 03/17/14 (from the past 48 hour(s))  Basic metabolic panel     Status: Abnormal   Collection Time: 03/17/14  9:51 PM  Result Value Ref Range   Sodium 135 135 - 145 mmol/L   Potassium 3.6 3.5 - 5.1 mmol/L   Chloride 107 96 - 112 mmol/L   CO2 26 19 - 32 mmol/L   Glucose, Bld 102 (H) 70 - 99 mg/dL   BUN 14 6 - 23 mg/dL   Creatinine, Ser 0.98 0.50 - 1.35 mg/dL   Calcium 8.8 8.4 - 10.5 mg/dL   GFR calc non Af Amer 84 (L) >90 mL/min   GFR calc Af Amer >90 >90 mL/min    Comment: (NOTE) The eGFR has been calculated using the CKD EPI equation. This calculation has not been validated in all clinical situations. eGFR's persistently <90 mL/min signify possible Chronic Kidney Disease.    Anion gap 2 (L) 5 - 15  CBC with Differential      Status: Abnormal   Collection Time: 03/17/14  9:51 PM  Result Value Ref Range   WBC 4.6 4.0 - 10.5 K/uL   RBC 4.53 4.22 - 5.81 MIL/uL   Hemoglobin 13.7 13.0 - 17.0 g/dL   HCT 41.3 39.0 - 52.0 %   MCV 91.2 78.0 - 100.0 fL   MCH 30.2 26.0 - 34.0 pg   MCHC 33.2 30.0 - 36.0 g/dL   RDW 13.6 11.5 - 15.5 %   Platelets 181 150 - 400 K/uL   Neutrophils Relative % 25 (L) 43 - 77 %   Neutro Abs 1.1 (L) 1.7 - 7.7 K/uL   Lymphocytes Relative 63 (H) 12 - 46 %   Lymphs Abs 3.0 0.7 - 4.0 K/uL   Monocytes Relative 7 3 - 12 %   Monocytes Absolute 0.3 0.1 - 1.0 K/uL   Eosinophils Relative 4 0 - 5 %   Eosinophils Absolute 0.2 0.0 - 0.7 K/uL   Basophils Relative 1 0 - 1 %   Basophils Absolute 0.0 0.0 - 0.1 K/uL  Troponin I  Status: Abnormal   Collection Time: 03/17/14  9:51 PM  Result Value Ref Range   Troponin I 0.08 (H) <0.031 ng/mL    Comment:        PERSISTENTLY INCREASED TROPONIN VALUES IN THE RANGE OF 0.04-0.49 ng/mL CAN BE SEEN IN:       -UNSTABLE ANGINA       -CONGESTIVE HEART FAILURE       -MYOCARDITIS       -CHEST TRAUMA       -ARRYHTHMIAS       -LATE PRESENTING MYOCARDIAL INFARCTION       -COPD   CLINICAL FOLLOW-UP RECOMMENDED.   Troponin I     Status: Abnormal   Collection Time: 03/18/14  1:45 AM  Result Value Ref Range   Troponin I 0.07 (H) <0.031 ng/mL    Comment:        PERSISTENTLY INCREASED TROPONIN VALUES IN THE RANGE OF 0.04-0.49 ng/mL CAN BE SEEN IN:       -UNSTABLE ANGINA       -CONGESTIVE HEART FAILURE       -MYOCARDITIS       -CHEST TRAUMA       -ARRYHTHMIAS       -LATE PRESENTING MYOCARDIAL INFARCTION       -COPD   CLINICAL FOLLOW-UP RECOMMENDED.   MRSA PCR Screening     Status: None   Collection Time: 03/18/14  4:46 AM  Result Value Ref Range   MRSA by PCR NEGATIVE NEGATIVE    Comment:        The GeneXpert MRSA Assay (FDA approved for NASAL specimens only), is one component of a comprehensive MRSA colonization surveillance program. It is  not intended to diagnose MRSA infection nor to guide or monitor treatment for MRSA infections.   Urine rapid drug screen (hosp performed)     Status: None   Collection Time: 03/18/14  7:49 AM  Result Value Ref Range   Opiates NONE DETECTED NONE DETECTED   Cocaine NONE DETECTED NONE DETECTED   Benzodiazepines NONE DETECTED NONE DETECTED   Amphetamines NONE DETECTED NONE DETECTED   Tetrahydrocannabinol NONE DETECTED NONE DETECTED   Barbiturates NONE DETECTED NONE DETECTED    Comment:        DRUG SCREEN FOR MEDICAL PURPOSES ONLY.  IF CONFIRMATION IS NEEDED FOR ANY PURPOSE, NOTIFY LAB WITHIN 5 DAYS.        LOWEST DETECTABLE LIMITS FOR URINE DRUG SCREEN Drug Class       Cutoff (ng/mL) Amphetamine      1000 Barbiturate      200 Benzodiazepine   660 Tricyclics       630 Opiates          300 Cocaine          300 THC              50   Brain natriuretic peptide     Status: Abnormal   Collection Time: 03/18/14  8:15 AM  Result Value Ref Range   B Natriuretic Peptide 405.5 (H) 0.0 - 100.0 pg/mL  Troponin I (q 6hr x 3)     Status: Abnormal   Collection Time: 03/18/14  8:15 AM  Result Value Ref Range   Troponin I 0.06 (H) <0.031 ng/mL    Comment:        PERSISTENTLY INCREASED TROPONIN VALUES IN THE RANGE OF 0.04-0.49 ng/mL CAN BE SEEN IN:       -UNSTABLE ANGINA       -  CONGESTIVE HEART FAILURE       -MYOCARDITIS       -CHEST TRAUMA       -ARRYHTHMIAS       -LATE PRESENTING MYOCARDIAL INFARCTION       -COPD   CLINICAL FOLLOW-UP RECOMMENDED.   TSH     Status: None   Collection Time: 03/18/14  8:15 AM  Result Value Ref Range   TSH 1.693 0.350 - 4.500 uIU/mL  Comprehensive metabolic panel     Status: Abnormal   Collection Time: 03/18/14  8:15 AM  Result Value Ref Range   Sodium 138 135 - 145 mmol/L   Potassium 3.9 3.5 - 5.1 mmol/L   Chloride 105 96 - 112 mmol/L   CO2 28 19 - 32 mmol/L   Glucose, Bld 94 70 - 99 mg/dL   BUN 11 6 - 23 mg/dL   Creatinine, Ser 0.99 0.50 -  1.35 mg/dL   Calcium 9.2 8.4 - 10.5 mg/dL   Total Protein 7.2 6.0 - 8.3 g/dL   Albumin 3.5 3.5 - 5.2 g/dL   AST 33 0 - 37 U/L   ALT 40 0 - 53 U/L   Alkaline Phosphatase 53 39 - 117 U/L   Total Bilirubin 1.7 (H) 0.3 - 1.2 mg/dL   GFR calc non Af Amer 84 (L) >90 mL/min   GFR calc Af Amer >90 >90 mL/min    Comment: (NOTE) The eGFR has been calculated using the CKD EPI equation. This calculation has not been validated in all clinical situations. eGFR's persistently <90 mL/min signify possible Chronic Kidney Disease.    Anion gap 5 5 - 15  Lipid panel     Status: Abnormal   Collection Time: 03/18/14  8:15 AM  Result Value Ref Range   Cholesterol 231 (H) 0 - 200 mg/dL   Triglycerides 58 <150 mg/dL   HDL 57 >39 mg/dL   Total CHOL/HDL Ratio 4.1 RATIO   VLDL 12 0 - 40 mg/dL   LDL Cholesterol 162 (H) 0 - 99 mg/dL    Comment:        Total Cholesterol/HDL:CHD Risk Coronary Heart Disease Risk Table                     Men   Women  1/2 Average Risk   3.4   3.3  Average Risk       5.0   4.4  2 X Average Risk   9.6   7.1  3 X Average Risk  23.4   11.0        Use the calculated Patient Ratio above and the CHD Risk Table to determine the patient's CHD Risk.        ATP III CLASSIFICATION (LDL):  <100     mg/dL   Optimal  100-129  mg/dL   Near or Above                    Optimal  130-159  mg/dL   Borderline  160-189  mg/dL   High  >190     mg/dL   Very High     IMAGING: Dg Chest 2 View  03/17/2014   CLINICAL DATA:  Cough, head/chest congestion and feeling like he is hyperventilating x 1 month; PMH of HTN; never smoked  EXAM: CHEST - 2 VIEW  COMPARISON:  10/13/2009  FINDINGS: Small right pleural effusion. Heart size upper limits normal. Prominent right perihilar interstitial markings. No confluent airspace consolidation or  overt edema. Pneumothorax. Visualized skeletal structures are unremarkable.  IMPRESSION: 1. New small right pleural effusion.   Electronically Signed   By: Lucrezia Europe  M.D.   On: 03/17/2014 21:15   Ct Angio Chest Pe W/cm &/or Wo Cm  03/18/2014   CLINICAL DATA:  Chest pain, shortness of breath, cough, and head congestion. Hyperventilated for 1 month.  EXAM: CT ANGIOGRAPHY CHEST WITH CONTRAST  TECHNIQUE: Multidetector CT imaging of the chest was performed using the standard protocol during bolus administration of intravenous contrast. Multiplanar CT image reconstructions and MIPs were obtained to evaluate the vascular anatomy.  CONTRAST:  168m OMNIPAQUE IOHEXOL 350 MG/ML SOLN  COMPARISON:  None.  FINDINGS: Technically adequate study with good opacification of the central and segmental pulmonary arteries. No focal filling defects demonstrated. No evidence of significant pulmonary embolus.  Normal heart size. Normal caliber thoracic aorta. Coronary artery and aortic calcifications. Esophagus is decompressed. Mediastinal and hilar lymph nodes are upper limits of normal, possibly reactive. Small bilateral pleural effusions with bilateral basilar atelectasis. Vague patchy ground-glass infiltration scattered throughout both lungs but more prominent in the right middle lung. This may represent multifocal pneumonia or edema. Airways appear patent. No pneumothorax.  Included portions of the upper abdominal organs are grossly unremarkable.  Review of the MIP images confirms the above findings.  IMPRESSION: No evidence of significant pulmonary embolus. Small bilateral pleural effusions. Patchy ground-glass infiltration in the lungs, mostly on the right. This may indicate pneumonia or edema.   Electronically Signed   By: WLucienne CapersM.D.   On: 03/18/2014 00:16    IMPRESSION:  1.  Mildly positive troponin.  This may be secondary to demand ischemia with recent accelerated hypertension since he had run out of his ace inhibition 4 days ago.  However, his history is concerning for ischemic etiology and may be due to non-ST segment elevation myocardial infarction.  Over the past 6 months  he has noticed a definite decline in exercise capacity with exertional dyspnea and has noticed episodes of chest tightness associated with difficulty breathing.  Recommend serial troponins and ECG.  He is currently on IV heparin and nitroglycerin.  Would continue until definitive cardiac catheterization tomorrow.  2.  Mild CHF, suspect significant diastolic component with his hypertensive heart disease.  BNP is mildly elevated.  CT scan revealed mild bilateral pleural effusions possible edema.  3.  Accelerated hypertension contributed by recent medication noncompliance since running out of his lisinopril.  4.  Bradycardia with prolonged first-degree AV block and transient junctional rhythm.  Will need to make certain this is not due to potential RCA ischemia versus sick sinus syndrome.  4.  Hyperlipidemia with LDL at 162.  He states he has been on simvastatin.  This will need to be discontinued and changed to more aggressive lipid-lowering therapy with target LDL less than 70.  We'll start atorvastatin at 40 mg.  5.  Cardiac murmur.  2-D echo Doppler study was recently completed.   RECOMMENDATION:  In light of the patient's cardiac risk factors, progressive exertional shortness of breath with episodes of chest pressure, significant bradycardia arrhythmia, and mildly positive troponins, I have recommended definitive cardiac catheterization to delineate his coronary anatomy.  Presently, I am adding amlodipine 5 mg daily for blood pressure control, which should not have any significant negative chronotropic effect.  I will also give him an dose of furosemide 20 mg orally additionally today.  We will try to obtain more information concerning a previous stroke history.  The 2-D echo Doppler study was just recently completed.  This will be reviewed.  I will schedule him to undergo diagnostic cardiac catheterization tomorrow. The risks and benefits of a cardiac catheterization including, but not limited to,  death, stroke, MI, kidney damage and bleeding were discussed with the patient and his wife who indicate understanding and agree to proceed.   Attending:  [name], MD, [facility] 03/18/2014 11:37 AM

## 2014-03-19 NOTE — Progress Notes (Signed)
Patient ID: Phillip Booth, male   DOB: 04-07-1950, 64 y.o.   MRN: [id number] [facility] PROGRESS NOTE  Phillip Booth WNI:[id number] DOB: 28-Dec-1950 DOA: 03/17/2014 PCP: No primary care provider on file.  Brief narrative:    64 y.o. male with history of hypertension, hyperlipidemia and stroke who presented to [facility] ED with progressive shortness of breath and cough for past 1 week PTA, no associated chest pain, fevers or palpitations.  On admission, pt noted to have BP 206/112. CT angiogram of the chest done did not show PE but did show bilateral pleural effusion and groundglass opacities concerning for edema versus pneumonia. Patient was also found to be bradycardic with at times going into junctional rhythm. Patient's troponin levels were mildly elevated. Patient stated that he has ran out of his antihypertensive meds in past few days. He was started on nitro drip for blood pressure control. Cardiology has seen the patient in consultation. Recommendation is for cardiac catheterization.   Assessment/Plan:    Principal Problem: Accelerated hypertension - Patient required nitroglycerin drip since the admission. Hypertension uncontrolled likely secondary to noncompliance, running out of lisinopril. - Blood pressure is 170/92. - Continue nitroglycerin drip along with Norvasc 5 mg daily and lisinopril 10 mg daily. - Appreciate cardiology following.  Active Problems: Elevated troponin / NSTEMI / Bradycardia - Troponin levels 0.08, 0.07; likely demand ischemia from accelerated hypertension - Patient's history is concerning for ischemic etiology and elevated troponin could be from non-ST segment elevation MI. - Troponin level has trended down, 0.05 this morning. - He is currently on heparin and a nitroglycerin drip. - Cardiology recommends cardiac cath, planned for today.  Acute on chronic, systolic and diastolic CHF - Patient did report noticing definite decline in exercise capacity with  exertional dyspnea with occasional episodes of chest tightness over past couple of months prior to this admission. - BNP on this admission 405 - 2-D echo on this admission showed ejection fraction 45-50%. - Plan is for cardiac cath today. Appreciate cardiology assistance.  Dyslipidemia - Per cardiology, they recommend starting Lipitor 40 mg     Pleural effusion, right / ground glass opacities concerning for edema or pneumonia - New small right pleural effusion  - no evidence of fevers, normal WBC count so abx not started on admission     DVT Prophylaxis  - On heparin drip due to troponin elevation   Code Status: Full.  Family Communication:  plan of care discussed with the patient Disposition Plan: needs LHC today, not stable for discharge this am   IV access:  Peripheral IV  Procedures and diagnostic studies:    Dg Chest 2 View 03/17/2014  1. New small right pleural effusion.   Electronically Signed   By: Lucrezia Europe M.D.   On: 03/17/2014 21:15   Ct Angio Chest Pe W/cm &/or Wo Cm 03/18/2014   No evidence of significant pulmonary embolus. Small bilateral pleural effusions. Patchy ground-glass infiltration in the lungs, mostly on the right. This may indicate pneumonia or edema.      Medical Consultants:  Cardiology  Other Consultants:  None   IAnti-Infectives:   None    Leisa Lenz, MD  [facility] Pager [phone]  If 7PM-7AM, please contact night-coverage [email or web address] Password [facility] 03/19/2014, 10:48 AM   LOS: 2 days    HPI/Subjective: No acute overnight events.  Objective: Filed Vitals:   03/19/14 0745 03/19/14 0938 03/19/14 1040 03/19/14 1042  BP: 154/93  163/102 170/92  Pulse: 52 39 63 59  Temp:  97.6 F (36.4 C)     TempSrc: Oral     Resp: 17  23 20   Height:      Weight:      SpO2: 98%  99% 100%    Intake/Output Summary (Last 24 hours) at 03/19/14 1048 Last data filed at 03/19/14 0600  Gross per 24 hour  Intake 862.78 ml  Output    400 ml   Net 462.78 ml    Exam:   General:  Pt is not in acute distress  Cardiovascular: bradycardia, S1/S2 (+)  Respiratory: bilateral air entry, no wheezing, no crackles  Abdomen: appreciate BS, non tender abdomen  Extremities: No leg swelling, pulses palpable  Neuro: No focal deficits   Data Reviewed: Basic Metabolic Panel:  Recent Labs Lab 03/17/14 2151 03/18/14 0815 03/19/14 0606  NA 135 138 138  K 3.6 3.9 4.0  CL 107 105 105  CO2 26 28 28   GLUCOSE 102* 94 106*  BUN 14 11 13   CREATININE 0.98 0.99 0.90  CALCIUM 8.8 9.2 9.0   Liver Function Tests:  Recent Labs Lab 03/18/14 0815  AST 33  ALT 40  ALKPHOS 53  BILITOT 1.7*  PROT 7.2  ALBUMIN 3.5   No results for input(s): LIPASE, AMYLASE in the last 168 hours. No results for input(s): AMMONIA in the last 168 hours. CBC:  Recent Labs Lab 03/17/14 2151 03/19/14 0606  WBC 4.6 4.3  NEUTROABS 1.1*  --   HGB 13.7 12.8*  HCT 41.3 38.6*  MCV 91.2 91.7  PLT 181 187   Cardiac Enzymes:  Recent Labs Lab 03/17/14 2151 03/18/14 0145 03/18/14 0815 03/18/14 1220 03/18/14 1901  TROPONINI 0.08* 0.07* 0.06* 0.06* 0.05*   BNP: Invalid input(s): POCBNP CBG: No results for input(s): GLUCAP in the last 168 hours.  Recent Results (from the past 240 hour(s))  MRSA PCR Screening     Status: None   Collection Time: 03/18/14  4:46 AM  Result Value Ref Range Status   MRSA by PCR NEGATIVE NEGATIVE Final    Comment:        The GeneXpert MRSA Assay (FDA approved for NASAL specimens only), is one component of a comprehensive MRSA colonization surveillance program. It is not intended to diagnose MRSA infection nor to guide or monitor treatment for MRSA infections.      Scheduled Meds: . amLODipine  5 mg Oral Daily  . aspirin EC  325 mg Oral Daily  . lisinopril  10 mg Oral Daily  . pneumococcal 23 valent vaccine  0.5 mL Intramuscular Tomorrow-1000  . sodium chloride  3 mL Intravenous Q12H  . sodium chloride   3 mL Intravenous Q12H   Continuous Infusions: . sodium chloride 50 mL/hr at 03/19/14 0600  . heparin 1,400 Units/hr (03/19/14 0600)  . nitroGLYCERIN 10 mcg/min (03/19/14 0600)

## 2014-03-19 NOTE — Interval H&P Note (Signed)
Cath Lab Visit (complete for each Cath Lab visit)  Clinical Evaluation Leading to the Procedure:   ACS: Yes.    Non-ACS:    Anginal Classification: CCS III  Anti-ischemic medical therapy: Minimal Therapy (1 class of medications)  Non-Invasive Test Results: No non-invasive testing performed  Prior CABG: No previous CABG      History and Physical Interval Note:  03/19/2014 9:26 AM  Phillip Booth  has presented today for surgery, with the diagnosis of positive troponins  The various methods of treatment have been discussed with the patient and family. After consideration of risks, benefits and other options for treatment, the patient has consented to  Procedure(s): LEFT HEART CATHETERIZATION WITH CORONARY ANGIOGRAM (N/A) as a surgical intervention .  The patient's history has been reviewed, patient examined, no change in status, stable for surgery.  I have reviewed the patient's chart and labs.  Questions were answered to the patient's satisfaction.     Piya Mesch A

## 2014-03-19 NOTE — Discharge Summary (Signed)
ELECTROPHYSIOLOGY PROCEDURE DISCHARGE SUMMARY    Patient ID: Phillip Booth,  MRN: [id number], DOB/AGE: [birth year] 64 y.o.  Admit date: 03/17/2014 Discharge date: 03/20/2014  Primary Care Physician: No primary care provider on file. Primary Cardiologist: Gerlene Burdock Electrophysiologist: Sharnette Kitamura  Primary Discharge Diagnosis:  Symptomatic bradycardia status post pacemaker implantation this admission  Secondary Discharge Diagnosis:  1.  CAD - moderate obstructive disease by cath this admission - medical therapy recommended 2.  Hypertension 3.  Hyperlipidemia 4.  Quetionable prior CVA - pt unsure of diagnosis  No Known Allergies   Procedures This Admission:  1.  Cardiac catheterization on 03/19/14 by Dr Claiborne Billings demonstrated 30-40% first diagonal stenosis, 20% mid LAD stenosis, 40% second diagonal stenosis of the LAD; 20% mid circumflex stenosis with distal. Branch stenoses of 90 and 95% in very distal small vessels; and 20% mid RCA stenosis with 70% ostial stenosis and RV marginal branch. 2.  Implantation of a STJ CRTP on 03/19/14 by Dr Rayann Heman.  The patient received a STJ model number Allure Quadra PPM with model number 2088 right atrial lead, 1948 right ventricular lead, and 1458 left ventricular lead. There were no immediate post procedure complications. 3.  CXR on 03/20/14 demonstrated no pneumothorax status post device implantation.   Brief HPI/Hospital Course: Phillip Booth is a 64 y.o. male with a past medical history significant for hypertension, hyperlipidemia, and prior CVA who was admitted on 03-17-14 with exertional fatigue and shortness of breath associated with chest tightness. His troponin was slightly elevated and he underwent cardiac catheterization today which demonstrated no targets for revascularization with medical therapy recommended. EP was asked to evaluate for bradycardia as a cause for symptoms.  With no reversible causes for bradycardia and EF of 45% with high likelihood of  RV pacing, CRTP implantation was recommended.  Risks, benefits and alternatives were reviewed with the patient who wished to proceed. The patient underwent implantation of a STJ CRTP with details as outlined above.  He  was monitored on telemetry overnight which demonstrated AV pacing.  Left chest was without hematoma or ecchymosis.  The device was interrogated and found to be functioning normally.  CXR was obtained and demonstrated no pneumothorax status post device implantation.  Wound care, arm mobility, and restrictions were reviewed with the patient.  Tthe patient was examined and considered stable for discharge to home.   His blood pressure has been elevated this admission.  Coreg and Amlodipine added at discharge.  Lisinopril increased to 20mg  daily.  Can evaluate need for uptitration of medications at follow up.   Beta blocker will be added at discharge for CAD and cardiomyopathy.   Physical Exam: Filed Vitals:   03/20/14 0008 03/20/14 0400 03/20/14 0725 03/20/14 0810  BP: 172/90  190/102 174/97  Pulse:   64   Temp: 98.4 F (36.9 C) 97.9 F (36.6 C)  98.2 F (36.8 C)  TempSrc: Oral Oral  Oral  Resp:   25   Height:      Weight:  212 lb 4.9 oz (96.3 kg)    SpO2:   94%     GEN- The patient is well appearing, alert and oriented x 3 today.   HEENT: normocephalic, atraumatic; sclera clear, conjunctiva pink; hearing intact; oropharynx clear; neck supple, no JVP Lymph- no cervical lymphadenopathy Lungs- Clear to ausculation bilaterally, normal work of breathing.  No wheezes, rales, rhonchi Heart- Regular rate and rhythm, 2/6 SEM, no rubs or gallops, PMI not laterally displaced GI- soft, non-tender, non-distended, bowel sounds  present, no hepatosplenomegaly Extremities- no clubbing, cyanosis, or edema; DP/PT/radial pulses 2+ bilaterally MS- no significant deformity or atrophy Skin- warm and dry, no rash or lesion, left chest without hematoma/ecchymosis Psych- euthymic mood, full  affect Neuro- strength and sensation are intact   Labs:   Lab Results  Component Value Date   WBC 4.1 03/19/2014   HGB 13.2 03/19/2014   HCT 37.7* 03/19/2014   MCV 88.9 03/19/2014   PLT 174 03/19/2014    Recent Labs Lab 03/18/14 0815  03/20/14 0402  NA 138  < > 136  K 3.9  < > 4.0  CL 105  < > 104  CO2 28  < > 24  BUN 11  < > 8  CREATININE 0.99  < > 0.90  CALCIUM 9.2  < > 9.1  PROT 7.2  --   --   BILITOT 1.7*  --   --   ALKPHOS 53  --   --   ALT 40  --   --   AST 33  --   --   GLUCOSE 94  < > 96  < > = values in this interval not displayed.  Discharge Medications:    Medication List    TAKE these medications        amLODipine 10 MG tablet  Commonly known as:  NORVASC  Take 1 tablet (10 mg total) by mouth daily.     aspirin 81 MG tablet  Take 162 mg by mouth daily.     carvedilol 6.25 MG tablet  Commonly known as:  COREG  Take 1 tablet (6.25 mg total) by mouth 2 (two) times daily with a meal.     HYDROcodone-acetaminophen 5-325 MG per tablet  Commonly known as:  NORCO  Take 1 tablet by mouth every 6 (six) hours as needed for moderate pain.     lisinopril 20 MG tablet  Commonly known as:  PRINIVIL,ZESTRIL  Take 1 tablet (20 mg total) by mouth daily.        Disposition:   Follow-up Information    Follow up with Patsey Berthold, NP On 04/02/2014.   Specialty:  Nurse Practitioner   Why:  at 3:30PM   Contact information:   [place] [address] [phone]       Duration of Discharge Encounter: Greater than 30 minutes including physician time.  Signed, Chanetta Marshall, NP 03/20/2014 9:06 AM   I have seen, examined the patient, and reviewed the above assessment and plan.  Changes to above are made where necessary.  Device interrogation is reviewed and normal.  CXR reveals stable leads  DC to home with close outpatient follow-up  Co Sign: [name], MD 03/20/2014 9:34 AM

## 2014-03-19 NOTE — CV Procedure (Signed)
Phillip Booth is a 64 y.o. male   [id number]  [id number] LOCATION:  FACILITY: [facility]  PHYSICIAN: [name], MD, [facility] February 04, 1950   DATE OF PROCEDURE:  03/19/2014     CARDIAC CATHETERIZATION    HISTORY:   Phillip Booth is a 64 year old African-American male with a history of hypertension and hyperlipidemia. He has developed exertional shortness of breath and episodes of chest tightness.   He had run out of his lisinopril which he had been on for hypertension.  He presented with accelerated hypertension.  Troponins are mildly positive. He has been found to have prolonged first-degree AV block AV block, frequent 2:1 block, symptomatic secondary Wenkebach AV block.    PROCEDURE:  Left heart catheterization via the radial approach: Coronary angiography, left ventriculography.  The patient was brought to the second floor [facility] cath lab in the postabsorptive state. The patient was premedicated with Versed 2 mg and fentanyl 50 mcg. A right radial approach was utilized after an Allen's test verified adequate circulation. The right radial artery was punctured via the Seldinger technique, and a 6 [place] Glidesheath Slender was inserted without difficulty.  A radial cocktail consisting of Verapamil, IV nitroglycerin, and lidocaine was administered. Weight adjusted heparin was administered. A safety J wire was advanced into the ascending aorta. Diagnostic catheterization was done with a 5 [place] TIG 4.0 catheter. A 5 French pigtail catheter was used for left ventriculography. A TR radial band was applied for hemostasis. The patient left the catheterization laboratory in stable condition.   HEMODYNAMICS:   Central Aorta: 150/89  Left Ventricle: 150/22  Throughout the procedure, the patient had a variable heart rhythm with First-degree AV block, second-degree Type I block, 2 to 1 block , and junctional rhythm with  heart rate decreasing  transiently to 30-40 beats per  minute.  ANGIOGRAPHY:   The left main coronary artery was a normal vessel which bifurcated into the LAD and left circumflex coronary artery.   The LAD was a moderate size vessel that gave rise to a proximal diagonal vessel.  There was 30-40% narrowing in this first diagonal vessel. The LAD after the septal perforating artery and before the second diagonal vessel at 20% narrowing.  There was 40% ostial narrowing in the second diagonal vessel.  The remainder of the LAD was free of significant disease and wrapped around the LV apex.  The left circumflex coronary artery was a moderate size vessel that had 20% narrowing in its mid segment.  There was a 90% stenosis in a small  branch and  The marginal vessel.  Then trifurcated distally.  There was 90% stenosis in a very small branch vessel and in the very distal aspect of the superior branch were 50 and then 95% near apical stenosis in a small distal  Vessel.  The RCA was a moderate size vessel that had 20% mid narrowing.  There was 60-70% ostial narrowing in a small anterior RV marginal branch prior to its bifurcation.  Left ventriculography revealed  Mild LV dysfunction with an ejection fraction of approximately 45-50%.   IMPRESSION:  Coronary obstructive disease with 30-40% first diagonal stenosis, 20% mid LAD stenosis, 40% second diagonal stenosis of the LAD; 20% mid circumflex stenosis with distal.  Branch stenoses of 90 and 95% in very distal small vessels; and 20% mid RCA stenosis with 70% ostial stenosis and RV marginal branch.  Probable sick sinus syndrome with prolonged first-degree AV block, frequent symptomatic second degree AV block.   RECOMMENDATION:  Medical therapy for CAD. EP evaluation is recommended for need for probable permanent pacemaker.   [name], MD, [facility] 03/19/2014 6:10 PM

## 2014-03-19 NOTE — Telephone Encounter (Signed)
TOC w/ Chanetta Marshall 3/24 @ 3:30  Per staff message:       Will be TOC visit on 3/24 with me, going home tomorrow. Will you please make sure phone call arranged? Thank you!

## 2014-03-19 NOTE — Progress Notes (Signed)
ANTICOAGULATION CONSULT NOTE  Pharmacy Consult for Heparin Indication: chest pain/ACS  No Known Allergies  Labs:  Recent Labs  03/17/14 2151  03/18/14 0815 03/18/14 1220 03/18/14 1400 03/18/14 1901 03/18/14 2258 03/19/14 0606  HGB 13.7  --   --   --   --   --   --  12.8*  HCT 41.3  --   --   --   --   --   --  38.6*  PLT 181  --   --   --   --   --   --  187  HEPARINUNFRC  --   --   --   --  0.16*  --  0.46 0.40  CREATININE 0.98  --  0.99  --   --   --   --  0.90  TROPONINI 0.08*  < > 0.06* 0.06*  --  0.05*  --   --   < > = values in this interval not displayed.  Estimated Creatinine Clearance: 100.5 mL/min (by C-G formula based on Cr of 0.9).   Assessment: 64 y.o. male with SOB/elevated cardiac markers, for heparin Chest CT negative for PE Cath planned for 3/10 Heparin level therapeutic  Goal of Therapy:  Heparin level 0.3-0.7 units/ml Monitor platelets by anticoagulation protocol: Yes   Plan:  Continue heparin at 1400 units / hr Follow up after cath  Thank you. Anette Guarneri, PharmD [phone] 03/19/2014,8:50 AM

## 2014-03-19 NOTE — Interval H&P Note (Signed)
History and Physical Interval Note:  03/19/2014 4:05 PM  Phillip Booth  has presented today for surgery, with the diagnosis of heart block  The various methods of treatment have been discussed with the patient and family. After consideration of risks, benefits and other options for treatment, the patient has consented to  Procedure(s): BI-VENTRICULAR PACEMAKER INSERTION (CRT-P) (N/A) as a surgical intervention .  The patient's history has been reviewed, patient examined, no change in status, stable for surgery.  I have reviewed the patient's chart and labs.  Questions were answered to the patient's satisfaction.     [name]

## 2014-03-20 ENCOUNTER — Inpatient Hospital Stay (HOSPITAL_COMMUNITY): Payer: Medicare Other

## 2014-03-20 ENCOUNTER — Encounter (HOSPITAL_COMMUNITY): Payer: Self-pay | Admitting: Internal Medicine

## 2014-03-20 DIAGNOSIS — I495 Sick sinus syndrome: Secondary | ICD-10-CM

## 2014-03-20 DIAGNOSIS — I248 Other forms of acute ischemic heart disease: Secondary | ICD-10-CM | POA: Diagnosis not present

## 2014-03-20 LAB — BASIC METABOLIC PANEL
Anion gap: 8 (ref 5–15)
BUN: 8 mg/dL (ref 6–23)
CALCIUM: 9.1 mg/dL (ref 8.4–10.5)
CO2: 24 mmol/L (ref 19–32)
Chloride: 104 mmol/L (ref 96–112)
Creatinine, Ser: 0.9 mg/dL (ref 0.50–1.35)
GFR, EST NON AFRICAN AMERICAN: 87 mL/min — AB (ref >90)
Glucose, Bld: 96 mg/dL (ref 70–99)
POTASSIUM: 4 mmol/L (ref 3.5–5.1)
SODIUM: 136 mmol/L (ref 135–145)

## 2014-03-20 MED ORDER — LISINOPRIL 20 MG PO TABS: 20.0000 mg | ORAL_TABLET | Freq: Every day | ORAL | Status: DC

## 2014-03-20 MED ORDER — CARVEDILOL 6.25 MG PO TABS: 6.2500 mg | ORAL_TABLET | Freq: Two times a day (BID) | ORAL | Status: DC

## 2014-03-20 MED ORDER — AMLODIPINE BESYLATE 10 MG PO TABS: 10.0000 mg | ORAL_TABLET | Freq: Every day | ORAL | Status: DC

## 2014-03-20 MED ORDER — HYDRALAZINE HCL 20 MG/ML IJ SOLN
10.0000 mg | Freq: Once | INTRAMUSCULAR | Status: AC
  Administered 2014-03-20: 10 mg via INTRAVENOUS
  Filled 2014-03-20: qty 1

## 2014-03-20 MED ORDER — HYDROCODONE-ACETAMINOPHEN 5-325 MG PO TABS: 1.0000 | ORAL_TABLET | Freq: Four times a day (QID) | ORAL | Status: DC | PRN

## 2014-03-20 MED ORDER — LORAZEPAM 1 MG PO TABS
1.0000 mg | ORAL_TABLET | Freq: Once | ORAL | Status: AC
  Administered 2014-03-20: 13:00:00 1 mg via ORAL
  Filled 2014-03-20: qty 1

## 2014-03-20 NOTE — Discharge Instructions (Signed)
° ° °  Supplemental Discharge Instructions for  Pacemaker/Defibrillator Patients  Activity No heavy lifting or vigorous activity with your left/right arm for 6 to 8 weeks.  Do not raise your left/right arm above your head for one week.  Gradually raise your affected arm as drawn below.           __        03-23-14                   03-24-14                  03-25-14                    03-26-14  NO DRIVING for 1 week    ; you may begin driving on 5-74-73    .  WOUND CARE - Keep the wound area clean and dry.  Do not get this area wet for one week. No showers for one week; you may shower on   03-26-14  . - The tape/steri-strips on your wound will fall off; do not pull them off.  No bandage is needed on the site.  DO  NOT apply any creams, oils, or ointments to the wound area. - If you notice any drainage or discharge from the wound, any swelling or bruising at the site, or you develop a fever > 101? F after you are discharged home, call the office at once.  Special Instructions - You are still able to use cellular telephones; use the ear opposite the side where you have your pacemaker/defibrillator.  Avoid carrying your cellular phone near your device. - When traveling through airports, show security personnel your identification card to avoid being screened in the metal detectors.  Ask the security personnel to use the hand wand. - Avoid arc welding equipment, MRI testing (magnetic resonance imaging), TENS units (transcutaneous nerve stimulators).  Call the office for questions about other devices. - Avoid electrical appliances that are in poor condition or are not properly grounded. - Microwave ovens are safe to be near or to operate.

## 2014-03-23 NOTE — Telephone Encounter (Signed)
Patient contacted regarding discharge from [facility] on March 20, 2014.  Patient understands to follow up with provider Chanetta Marshall, NP on April 02, 2014 at 3:30PM at [facility]. Patient understands discharge instructions? yes Patient understands medications and regiment? yes Patient understands to bring all medications to this visit? yes

## 2014-04-01 ENCOUNTER — Ambulatory Visit: Payer: Medicare Other

## 2014-04-02 ENCOUNTER — Encounter: Payer: Self-pay | Admitting: Nurse Practitioner

## 2014-04-02 ENCOUNTER — Ambulatory Visit (INDEPENDENT_AMBULATORY_CARE_PROVIDER_SITE_OTHER): Payer: PRIVATE HEALTH INSURANCE | Admitting: Nurse Practitioner

## 2014-04-02 VITALS — BP 148/72 | HR 75 | Ht 76.0 in | Wt 206.2 lb

## 2014-04-02 DIAGNOSIS — Z950 Presence of cardiac pacemaker: Secondary | ICD-10-CM | POA: Diagnosis not present

## 2014-04-02 DIAGNOSIS — R001 Bradycardia, unspecified: Secondary | ICD-10-CM

## 2014-04-02 DIAGNOSIS — I429 Cardiomyopathy, unspecified: Secondary | ICD-10-CM | POA: Diagnosis not present

## 2014-04-02 DIAGNOSIS — Z0000 Encounter for general adult medical examination without abnormal findings: Secondary | ICD-10-CM | POA: Diagnosis not present

## 2014-04-02 DIAGNOSIS — I428 Other cardiomyopathies: Secondary | ICD-10-CM

## 2014-04-02 LAB — MDC_IDC_ENUM_SESS_TYPE_INCLINIC
Battery Remaining Longevity: 61.2 mo
Battery Voltage: 2.99 V
Brady Statistic RA Percent Paced: 99 %
Brady Statistic RV Percent Paced: 99.99 %
Implantable Pulse Generator Model: 3242
Implantable Pulse Generator Serial Number: 7724805
Lead Channel Impedance Value: 625 Ohm
Lead Channel Pacing Threshold Amplitude: 0.5 V
Lead Channel Pacing Threshold Amplitude: 0.75 V
Lead Channel Pacing Threshold Amplitude: 3.25 V
Lead Channel Pacing Threshold Pulse Width: 0.5 ms
Lead Channel Sensing Intrinsic Amplitude: 12 mV
Lead Channel Setting Pacing Amplitude: 1.625
Lead Channel Setting Pacing Amplitude: 4.375
Lead Channel Setting Pacing Pulse Width: 0.5 ms
Lead Channel Setting Pacing Pulse Width: 0.5 ms
MDC IDC MSMT LEADCHNL LV IMPEDANCE VALUE: 487.5 Ohm
MDC IDC MSMT LEADCHNL LV PACING THRESHOLD PULSEWIDTH: 0.5 ms
MDC IDC MSMT LEADCHNL RA IMPEDANCE VALUE: 487.5 Ohm
MDC IDC MSMT LEADCHNL RA PACING THRESHOLD PULSEWIDTH: 0.5 ms
MDC IDC SESS DTM: 20160324154154
MDC IDC SET LEADCHNL RV PACING AMPLITUDE: 2 V
MDC IDC SET LEADCHNL RV SENSING SENSITIVITY: 2 mV

## 2014-04-02 NOTE — Patient Instructions (Addendum)
Your physician recommends that you continue on your current medications as directed. Please refer to the Current Medication list given to you today.  A chest x-ray takes a picture of the organs and structures inside the chest, including the heart, lungs, and blood vessels. This test can show several things, including, whether the heart is enlarges; whether fluid is building up in the lungs; and whether pacemaker / defibrillator leads are still in place.  You have been referred to family medicine for a primary care physician.  Your physician recommends that you schedule a follow-up appointment on 04/12/12 with Chanetta Marshall, NP.

## 2014-04-03 ENCOUNTER — Encounter: Payer: Self-pay | Admitting: Nurse Practitioner

## 2014-04-03 DIAGNOSIS — I2510 Atherosclerotic heart disease of native coronary artery without angina pectoris: Secondary | ICD-10-CM | POA: Insufficient documentation

## 2014-04-03 DIAGNOSIS — I428 Other cardiomyopathies: Secondary | ICD-10-CM | POA: Insufficient documentation

## 2014-04-03 NOTE — Progress Notes (Signed)
Electrophysiology Office Note Date: 04/03/2014  ID:  Phillip Booth, DOB 12/27/1950, MRN [id number]  PCP: No PCP Per Patient Primary Cardiologist: Phillip Booth Electrophysiologist: Allred  CC: post hospital follow-up  Phillip Booth is a 64 y.o. male is seen today for Dr Rayann Heman.  He presents today for post hopstial electrophysiology followup. He was admitted earlier this month with symptomatic bradycardia with no reversible causes identified. Catheterization demonstrated moderate CAD but no targets for revascularization.  EF was 45% and with high likelihood of RV pacing, CRTP implant was recommended.  He underwent implantation of a STJ CRTP by Dr Rayann Heman.  Since discharge, the patient reports doing very well.  He denies chest pain, palpitations, dyspnea, PND, orthopnea, nausea, vomiting, dizziness, syncope, edema, weight gain, or early satiety.  Device History: STJ CRTP implanted 03-2014 for symptomatic bradycardia and NICM   Past Medical History  Diagnosis Date  . CVA (cerebral infarction)     a. diagnosis not clear  . Hypertension   . Hyperlipidemia   . Vertigo   . Symptomatic bradycardia     a. s/p STJ CRTP implanted by Dr Rayann Heman  . CAD (coronary artery disease)     a. moderate by cath 03/2014  . Non-ischemic cardiomyopathy     a. EF 45% by echo 03/2014   Past Surgical History  Procedure Laterality Date  . Abdominal surgery    . Hernia repair    . Appendectomy    . Left heart catheterization with coronary angiogram N/A 03/19/2014    Procedure: LEFT HEART CATHETERIZATION WITH CORONARY ANGIOGRAM;  Surgeon: [name], MD;  Location: [facility] CATH LAB;  Service: Cardiovascular;  Laterality: N/A;  . Bi-ventricular pacemaker insertion N/A 03/19/2014    STJ CRTP implanted by Dr Rayann Heman    Current Outpatient Prescriptions  Medication Sig Dispense Refill  . amLODipine (NORVASC) 10 MG tablet Take 1 tablet (10 mg total) by mouth daily. 30 tablet 1  . aspirin 81 MG tablet Take 162 mg by mouth  daily.     . carvedilol (COREG) 6.25 MG tablet Take 1 tablet (6.25 mg total) by mouth 2 (two) times daily with a meal. 60 tablet 1  . HYDROcodone-acetaminophen (NORCO) 5-325 MG per tablet Take 1 tablet by mouth every 6 (six) hours as needed for moderate pain. 10 tablet 0  . lisinopril (PRINIVIL,ZESTRIL) 20 MG tablet Take 1 tablet (20 mg total) by mouth daily. 30 tablet 1   No current facility-administered medications for this visit.    Allergies:   Review of patient's allergies indicates no known allergies.   Social History: History   Social History  . Marital Status: Married    Spouse Name: N/A  . Number of Children: N/A  . Years of Education: N/A   Occupational History  . Not on file.   Social History Main Topics  . Smoking status: Never Smoker   . Smokeless tobacco: Never Used  . Alcohol Use: Yes     Comment: occasional  . Drug Use: No  . Sexual Activity: Not on file   Other Topics Concern  . Not on file   Social History Narrative    Family History: Family History  Problem Relation Age of Onset  . Hypertension Mother   . Cancer Mother     brain (possibly started in lung)  . Heart disease Mother   . Diabetes Mother   . Other Mother     cardiomegaly  . Hypertension Father   . Emphysema Father   .  Heart Problems Brother     pacemaker  . Heart Problems Other     "using 10% of heart"     Review of Systems: General: No chills, fever, night sweats or weight changes  Cardiovascular:  No chest pain, dyspnea on exertion, edema, orthopnea, palpitations, paroxysmal nocturnal dyspnea  Dermatological: No rash, lesions or masses Respiratory: No cough, dyspnea Urologic: No hematuria, dysuria Abdominal: ++ GERD, no nausea, vomiting, diarrhea, bright red blood per rectum, melena, or hematemesis Neurologic: No visual changes, weakness, changes in mental status All other systems reviewed and are otherwise negative except as noted above.   Physical Exam: VS:  BP  148/72 mmHg  Pulse 75  Ht 6\' 4"  (1.93 m)  Wt 206 lb 3.2 oz (93.532 kg)  BMI 25.11 kg/m2  SpO2 98% , BMI Body mass index is 25.11 kg/(m^2).  GEN- The patient is well appearing, alert and oriented x 3 today.   HEENT: normocephalic, atraumatic; sclera clear, conjunctiva pink; hearing intact; oropharynx clear; neck supple Lymph- no cervical lymphadenopathy Lungs- Clear to ausculation bilaterally, normal work of breathing.  No wheezes, rales, rhonchi Heart- Regular rate and rhythm, 2/6 SEM GI- soft, non-tender, non-distended, bowel sounds present Extremities- no clubbing, cyanosis, or edema; DP/PT/radial pulses 2+ bilaterally MS- no significant deformity or atrophy Skin- warm and dry, no rash or lesion; PPM pocket well healed Psych- euthymic mood, full affect Neuro- strength and sensation are intact  PPM Interrogation- reviewed in detail today,  See PACEART report  EKG:  EKG is not ordered today.  Recent Labs: 03/18/2014: ALT 40; TSH 1.693 03/19/2014: B Natriuretic Peptide 247.6*; Hemoglobin 13.2; Platelets 174 03/20/2014: BUN 8; Creatinine 0.90; Potassium 4.0; Sodium 136   Wt Readings from Last 3 Encounters:  04/02/14 206 lb 3.2 oz (93.532 kg)  03/20/14 212 lb 4.9 oz (96.3 kg)  01/17/12 218 lb (98.884 kg)     Other studies Reviewed: Additional studies/ records that were reviewed today include: hospital records    Assessment and Plan:  1.  Symptomatic bradycardia Significantly elevated LV threshold today from implant (was 0.75V, now 3.25V) Will check CXR to re-evaluate and follow up with me in 10 days to evaluate other pacing vectors based on LV lead position Wound well healed See Pace Art report No changes today  2.  Chronic systolic dysfunction Euvolemic on exam Continue BB, ACE-I  3.  HTN BP above goal The patient reports lower blood pressures at home Asked patient to keep a log between now and when he sees me back in 10 days.  Will up-titrate Coreg if BP elevated at  home at that time.  4.  CAD No recent ischemic symptoms continue BB, ASA Will need to arrange follow up with Dr Phillip Booth   He does not currently have a PCP, but has asked for a referral.  Will place referral to [facility] primary care today.    Current medicines are reviewed at length with the patient today.   The patient does not have concerns regarding his medicines.  The following changes were made today:  none  Labs/ tests ordered today include:  Orders Placed This Encounter  Procedures  . DG Chest 2 View  . Ambulatory referral to Family Practice     Disposition:   Follow up with me after CXR  Signed, Chanetta Marshall, NP 04/03/2014 5:31 PM  [facility] [place] [address] [phone] (office) [phone] (fax

## 2014-04-07 ENCOUNTER — Telehealth: Payer: Self-pay | Admitting: Nurse Practitioner

## 2014-04-07 NOTE — Telephone Encounter (Signed)
Pt has order for chest Xray and wants to go to [facility] to get it done.  Contacted facility and they allow for walk-ins anytime between [place] and pt made aware.

## 2014-04-07 NOTE — Telephone Encounter (Signed)
New Message    Patient is calling in regards to a chest xray that he is suppose to be having. He is confused about where the procedure is suppose to be done. Please give patient a call. Thanks

## 2014-04-08 ENCOUNTER — Encounter: Payer: Self-pay | Admitting: *Deleted

## 2014-04-08 ENCOUNTER — Ambulatory Visit (HOSPITAL_BASED_OUTPATIENT_CLINIC_OR_DEPARTMENT_OTHER)
Admission: RE | Admit: 2014-04-08 | Discharge: 2014-04-08 | Disposition: A | Discharge status: Home / Self Care | Payer: Medicare Other | Source: Ambulatory Visit | Attending: Nurse Practitioner | Admitting: Nurse Practitioner

## 2014-04-08 DIAGNOSIS — Z950 Presence of cardiac pacemaker: Secondary | ICD-10-CM | POA: Diagnosis not present

## 2014-04-08 DIAGNOSIS — I498 Other specified cardiac arrhythmias: Secondary | ICD-10-CM | POA: Diagnosis not present

## 2014-04-10 DIAGNOSIS — Z0289 Encounter for other administrative examinations: Secondary | ICD-10-CM

## 2014-04-10 NOTE — Progress Notes (Signed)
This encounter was created in error - please disregard.  This encounter was created in error - please disregard.

## 2014-04-13 ENCOUNTER — Telehealth: Payer: Self-pay | Admitting: *Deleted

## 2014-04-13 ENCOUNTER — Encounter: Payer: Medicare Other | Admitting: Nurse Practitioner

## 2014-04-13 NOTE — Telephone Encounter (Signed)
Patient answered phone, but Pre-Visit Call not completed because patient was driving on highway and unavailable to review chart.

## 2014-04-14 ENCOUNTER — Encounter: Payer: Self-pay | Admitting: Internal Medicine

## 2014-04-14 ENCOUNTER — Ambulatory Visit (INDEPENDENT_AMBULATORY_CARE_PROVIDER_SITE_OTHER): Payer: PRIVATE HEALTH INSURANCE | Admitting: Medical

## 2014-04-14 ENCOUNTER — Telehealth: Payer: Self-pay | Admitting: Medical

## 2014-04-14 ENCOUNTER — Encounter: Payer: Self-pay | Admitting: Medical

## 2014-04-14 VITALS — BP 128/80 | HR 90 | Temp 97.9°F | Ht 74.5 in | Wt 205.8 lb

## 2014-04-14 DIAGNOSIS — R001 Bradycardia, unspecified: Secondary | ICD-10-CM | POA: Diagnosis not present

## 2014-04-14 DIAGNOSIS — N528 Other male erectile dysfunction: Secondary | ICD-10-CM

## 2014-04-14 DIAGNOSIS — J948 Other specified pleural conditions: Secondary | ICD-10-CM | POA: Diagnosis not present

## 2014-04-14 DIAGNOSIS — Z87448 Personal history of other diseases of urinary system: Secondary | ICD-10-CM | POA: Insufficient documentation

## 2014-04-14 DIAGNOSIS — N400 Enlarged prostate without lower urinary tract symptoms: Secondary | ICD-10-CM

## 2014-04-14 DIAGNOSIS — I25811 Atherosclerosis of native coronary artery of transplanted heart without angina pectoris: Secondary | ICD-10-CM

## 2014-04-14 DIAGNOSIS — J90 Pleural effusion, not elsewhere classified: Secondary | ICD-10-CM

## 2014-04-14 DIAGNOSIS — I441 Atrioventricular block, second degree: Secondary | ICD-10-CM

## 2014-04-14 DIAGNOSIS — N529 Male erectile dysfunction, unspecified: Secondary | ICD-10-CM | POA: Insufficient documentation

## 2014-04-14 LAB — PSA, MEDICARE: PSA: 0.78 ng/ml (ref 0.10–4.00)

## 2014-04-14 MED ORDER — TAMSULOSIN HCL 0.4 MG PO CAPS: 0.4000 mg | ORAL_CAPSULE | Freq: Every day | ORAL | Status: DC

## 2014-04-14 NOTE — Assessment & Plan Note (Signed)
Stressed pt to get the atorvastatin and low cholesterol diet. When cardiologist clears for exercise start walking.

## 2014-04-14 NOTE — Assessment & Plan Note (Signed)
I want you to ask your cardiologist if he thinks class of meds such as viagra safe with your heart conditions.

## 2014-04-14 NOTE — Progress Notes (Signed)
Pre visit review using our clinic review tool, if applicable. No additional management support is needed unless otherwise documented below in the visit note. 

## 2014-04-14 NOTE — Progress Notes (Signed)
Subjective:    Patient ID: Phillip Booth, male    DOB: 1950-08-14, 64 y.o.   MRN: [id number]  HPI    Recent bradycardia symptomatic- Pt in for first time. Pt does have cardiologist. Cardiologist connected with [facility]. Pt saw them on 04-02-2014.  Recent heart procedures/pacemaker. Pt pulse in 90 today.   Heart murmur- history of. Pt note sure the type of.   Hyperlipidemia-Pt cholesterol 3 wks ago total cholesterol was 231 and  ldl was 162. Pt states is on statin. His discount card has not come in. Pt thinks he is on atorvastatin.  Htn- Pt bp has been controlled recently per his report. When he checks 314 systolic. Diastolic he can't remember.   BPH- possible he reports about 2 times in past years some urinary obstruction flow. Pt states he can't remember if ever had psa. But states daily very difficult urination to start urine stream.  Erectile dysfunction- difficulty getting and holding erections. Pt never asked his cardiologist if he could use viagra. But he did use in the past years ago and pt states it did help.  Some difficulty with frequent urination. Difficult to hold his urine at times.  Allergies-Maybe to some animals. Also seasonal.  Cataract- pt may get surgery in future.  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  Pt last seen last primary about 6 months ago.        Review of Systems  Constitutional: Negative for fever, chills, diaphoresis, activity change and fatigue.  Respiratory: Negative for cough, chest tightness and shortness of breath.   Cardiovascular: Negative for chest pain, palpitations and leg swelling.  Gastrointestinal: Negative for nausea, vomiting and abdominal pain.  Genitourinary: Positive for frequency and difficulty urinating. Negative for dysuria and hematuria.       Often time difficulty urinating. 2 times past year severe. Daily basis difficulty to go.   Musculoskeletal:  Negative for neck pain and neck stiffness.  Neurological: Negative for dizziness, tremors, seizures, syncope, facial asymmetry, speech difficulty, weakness, light-headedness, numbness and headaches.  Psychiatric/Behavioral: Negative for behavioral problems, confusion and agitation. The patient is not nervous/anxious.    Past Medical History  Diagnosis Date  . CVA (cerebral infarction)     a. diagnosis not clear  . Hypertension   . Hyperlipidemia   . Vertigo   . Symptomatic bradycardia     a. s/p STJ CRTP implanted by Dr Rayann Heman  . CAD (coronary artery disease)     a. moderate by cath 03/2014  . Non-ischemic cardiomyopathy     a. EF 45% by echo 03/2014  . Allergy   . Cataract   . Heart murmur     History   Social History  . Marital Status: Married    Spouse Name: N/A  . Number of Children: N/A  . Years of Education: N/A   Occupational History  . Not on file.   Social History Main Topics  . Smoking status: Never Smoker   . Smokeless tobacco: Never Used  . Alcohol Use: Yes     Comment: occasional  . Drug Use: No  . Sexual Activity: Yes   Other Topics Concern  . Not on file   Social History Narrative    Past Surgical History  Procedure Laterality Date  . Abdominal surgery    . Hernia repair    . Appendectomy    . Left heart catheterization with coronary angiogram N/A 03/19/2014  Procedure: LEFT HEART CATHETERIZATION WITH CORONARY ANGIOGRAM;  Surgeon: [name], MD;  Location: [facility] CATH LAB;  Service: Cardiovascular;  Laterality: N/A;  . Bi-ventricular pacemaker insertion N/A 03/19/2014    STJ CRTP implanted by Dr Rayann Heman    Family History  Problem Relation Age of Onset  . Hypertension Mother   . Cancer Mother     brain (possibly started in lung)  . Heart disease Mother   . Diabetes Mother   . Other Mother     cardiomegaly  . Hypertension Father   . Emphysema Father   . Heart Problems Brother     pacemaker  . Heart Problems Other     "using 10% of  heart"    No Known Allergies  Current Outpatient Prescriptions on File Prior to Visit  Medication Sig Dispense Refill  . amLODipine (NORVASC) 10 MG tablet Take 1 tablet (10 mg total) by mouth daily. 30 tablet 1  . aspirin 81 MG tablet Take 162 mg by mouth daily.     . carvedilol (COREG) 6.25 MG tablet Take 1 tablet (6.25 mg total) by mouth 2 (two) times daily with a meal. 60 tablet 1  . lisinopril (PRINIVIL,ZESTRIL) 20 MG tablet Take 1 tablet (20 mg total) by mouth daily. 30 tablet 1   No current facility-administered medications on file prior to visit.    BP 128/80 mmHg  Pulse 90  Temp(Src) 97.9 F (36.6 C) (Oral)  Ht 6' 2.5" (1.892 m)  Wt 205 lb 12.8 oz (93.35 kg)  BMI 26.08 kg/m2  SpO2 99%      Objective:   Physical Exam   General Mental Status- Alert. General Appearance- Not in acute distress.   Skin General: Color- Normal Color. Moisture- Normal Moisture.  Neck Carotid Arteries- Normal color. Moisture- Normal Moisture. No carotid bruits. No JVD.  Chest and Lung Exam Auscultation: Breath Sounds:-Normal.  Cardiovascular Auscultation:Rythm- Regular. Murmurs & Other Heart Sounds:Auscultation of the heart reveals- No Murmurs.  Abdomen Inspection:-Inspeection Normal. Palpation/Percussion:Note:No mass. Palpation and Percussion of the abdomen reveal- Non Tender, Non Distended + BS, no rebound or guarding.   Rectal- prostate smooth. Mild enlarged. No nodules felt. Hemmocult card negative    Neurologic Cranial Nerve exam:- CN III-XII intact(No nystagmus), symmetric smile. Drift Test:- No drift. Romberg Exam:- Negative.  Finger to Nose:- Normal/Intact Strength:- 5/5 equal and symmetric strength both upper and lower extremities.     Assessment & Plan:

## 2014-04-14 NOTE — Patient Instructions (Addendum)
Second degree heart block Recent bradycardia and got pacemaker. Doing well since.   Coronary artery disease involving native coronary artery Stressed pt to get the atorvastatin and low cholesterol diet. When cardiologist clears for exercise start walking.   Bradycardia Recent pace maker.   Pleural effusion, right Last cxr report states no effusions.   History of urinary tract obstruction Considering may be due to bph. Will get psa today and may call in flomax.   Erectile dysfunction I want you to ask your cardiologist if he thinks class of meds such as viagra safe with your heart conditions.    Follow up in 5 wks or as needed. Sign release of info form so we can get old records from prior pcp.

## 2014-04-14 NOTE — Assessment & Plan Note (Signed)
Considering may be due to bph. Will get psa today and may call in flomax.

## 2014-04-14 NOTE — Assessment & Plan Note (Signed)
Last cxr report states no effusions.

## 2014-04-14 NOTE — Telephone Encounter (Signed)
rx flomax.

## 2014-04-14 NOTE — Assessment & Plan Note (Signed)
Recent Psychologist, forensic.

## 2014-04-14 NOTE — Assessment & Plan Note (Signed)
Recent bradycardia and got pacemaker. Doing well since.

## 2014-04-15 ENCOUNTER — Other Ambulatory Visit: Payer: Self-pay

## 2014-04-15 NOTE — Telephone Encounter (Signed)
Left message for patient Rx for Flomax sent to pharmacy.

## 2014-04-16 ENCOUNTER — Other Ambulatory Visit: Payer: Self-pay | Admitting: Internal Medicine

## 2014-04-16 ENCOUNTER — Encounter: Payer: Self-pay | Admitting: Nurse Practitioner

## 2014-04-16 ENCOUNTER — Ambulatory Visit (INDEPENDENT_AMBULATORY_CARE_PROVIDER_SITE_OTHER): Payer: PRIVATE HEALTH INSURANCE | Admitting: Nurse Practitioner

## 2014-04-16 VITALS — BP 114/62 | HR 62 | Ht 75.5 in | Wt 205.4 lb

## 2014-04-16 DIAGNOSIS — R001 Bradycardia, unspecified: Secondary | ICD-10-CM

## 2014-04-16 DIAGNOSIS — I25811 Atherosclerosis of native coronary artery of transplanted heart without angina pectoris: Secondary | ICD-10-CM

## 2014-04-16 DIAGNOSIS — I10 Essential (primary) hypertension: Secondary | ICD-10-CM

## 2014-04-16 NOTE — Progress Notes (Signed)
Electrophysiology Office Note Date: 04/16/2014  ID:  Phillip Booth, DOB 20-Apr-1950, MRN [id number]  PCP: Elise Benne Primary Cardiologist: Claiborne Billings Electrophysiologist: Allred  CC: Follow up of elevated LV threshold  Phillip Booth is a 64 y.o. male is seen today for Dr Rayann Heman.  At post hospital visit, his LV threshold was found to be elevated.  He has had a CXR which demonstrated stable lead placement.  He presents today to evaluate LV pacing thresholds.   Since last being seen in our clinic, the patient reports doing very well. He denies chest pain, palpitations, dyspnea, PND, orthopnea, nausea, vomiting, dizziness, syncope.    He established with primary care yesterday.  He needs cataract surgery and is asking for clearance today.   Device History: STJ CRTP implanted 03-2014 for symptomatic bradycardia and non-ischemic cardiomyopathy    Past Medical History  Diagnosis Date  . CVA (cerebral infarction)     a. diagnosis not clear  . Hypertension   . Hyperlipidemia   . Vertigo   . Symptomatic bradycardia     a. s/p STJ CRTP implanted by Dr Rayann Heman  . CAD (coronary artery disease)     a. moderate by cath 03/2014  . Non-ischemic cardiomyopathy     a. EF 45% by echo 03/2014  . Allergy   . Cataract    Past Surgical History  Procedure Laterality Date  . Abdominal surgery    . Hernia repair    . Appendectomy    . Left heart catheterization with coronary angiogram N/A 03/19/2014    Procedure: LEFT HEART CATHETERIZATION WITH CORONARY ANGIOGRAM;  Surgeon: [name], MD;  Location: [facility] CATH LAB;  Service: Cardiovascular;  Laterality: N/A;  . Bi-ventricular pacemaker insertion N/A 03/19/2014    STJ CRTP implanted by Dr Rayann Heman    Current Outpatient Prescriptions  Medication Sig Dispense Refill  . amLODipine (NORVASC) 10 MG tablet Take 1 tablet (10 mg total) by mouth daily. 30 tablet 1  . aspirin 81 MG tablet Take 162 mg by mouth daily.     . carvedilol (COREG) 6.25 MG  tablet Take 1 tablet (6.25 mg total) by mouth 2 (two) times daily with a meal. 60 tablet 1  . lisinopril (PRINIVIL,ZESTRIL) 20 MG tablet Take 1 tablet (20 mg total) by mouth daily. 30 tablet 1  . tamsulosin (FLOMAX) 0.4 MG CAPS capsule Take 1 capsule (0.4 mg total) by mouth daily. 30 capsule 1   No current facility-administered medications for this visit.    Allergies:   Review of patient's allergies indicates no known allergies.   Social History: History   Social History  . Marital Status: Married    Spouse Name: N/A  . Number of Children: N/A  . Years of Education: N/A   Occupational History  . Not on file.   Social History Main Topics  . Smoking status: Never Smoker   . Smokeless tobacco: Never Used  . Alcohol Use: Yes     Comment: occasional  . Drug Use: No  . Sexual Activity: Yes   Other Topics Concern  . Not on file   Social History Narrative    Family History: Family History  Problem Relation Age of Onset  . Hypertension Mother   . Cancer Mother     brain (possibly started in lung)  . Heart disease Mother   . Diabetes Mother   . Other Mother     cardiomegaly  . Hypertension Father   . Emphysema Father   .  Heart Problems Brother     pacemaker  . Heart Problems Other     "using 10% of heart"    Review of Systems: All other systems reviewed and are otherwise negative except as noted above.   Physical Exam: VS:  BP 114/62 mmHg  Pulse 62  Ht 6' 3.5" (1.918 m)  Wt 205 lb 6.4 oz (93.169 kg)  BMI 25.33 kg/m2 , BMI Body mass index is 25.33 kg/(m^2).  GEN- The patient is well appearing, alert and oriented x 3 today.  HEENT: normocephalic, atraumatic; sclera clear, conjunctiva pink; hearing intact; oropharynx clear; neck supple Lymph- no cervical lymphadenopathy Lungs- Clear to ausculation bilaterally, normal work of breathing. No wheezes, rales, rhonchi Heart- Regular rate and rhythm, 2/6 SEM GI- soft, non-tender, non-distended, bowel sounds  present Extremities- no clubbing, cyanosis, or edema; DP/PT/radial pulses 2+ bilaterally MS- no significant deformity or atrophy Skin- warm and dry, no rash or lesion; PPM pocket well healed Psych- euthymic mood, full affect Neuro- strength and sensation are intact  PPM interrogation- reviewed in detail today,  See PACEART report  EKG:  EKG is not ordered today  Recent Labs: 03/18/2014: ALT 40; TSH 1.693 03/19/2014: B Natriuretic Peptide 247.6*; Hemoglobin 13.2; Platelets 174 03/20/2014: BUN 8; Creatinine 0.90; Potassium 4.0; Sodium 136   Wt Readings from Last 3 Encounters:  04/16/14 205 lb 6.4 oz (93.169 kg)  04/14/14 205 lb 12.8 oz (93.35 kg)  04/02/14 206 lb 3.2 oz (93.532 kg)     Assessment and Plan:  1.  Symptomatic bradycardia Recent CXR demonstrated stable lead placement LV lead threshold today improved at 1.5V - no changes made today.  See PaceArt report  2.  Chronic systolic dysfunction Euvolemic on exam Continue BB, ACE-I  3.  HTN BP much improved today Average blood pressure readings at home 120's the patient thinks.  He will call the office with home BP readings.   4.  CAD No recent ischemic symptoms Follow up with Dr Claiborne Billings 3 months Continue BB, ASA  5.  Need for cataract surgery The patient is at acceptable cardiac risk for cataract surgery.  Recent catheterization demonstrated moderate CAD but no targets for revascularization.  No ischemic symptoms  Current medicines are reviewed at length with the patient today.   The patient does not have concerns regarding his medicines.  The following changes were made today:  none   Disposition:   Follow up with Dr Rayann Heman as scheduled; follow up with Dr Claiborne Billings in 3 months    Signed, Chanetta Marshall, NP 04/16/2014 1:29 PM  [facility] [address] [place] [address] [phone] (office) [phone] (fax)

## 2014-04-20 DIAGNOSIS — D485 Neoplasm of uncertain behavior of skin: Secondary | ICD-10-CM | POA: Diagnosis not present

## 2014-04-20 DIAGNOSIS — L905 Scar conditions and fibrosis of skin: Secondary | ICD-10-CM | POA: Diagnosis not present

## 2014-05-01 DIAGNOSIS — I480 Paroxysmal atrial fibrillation: Secondary | ICD-10-CM

## 2014-05-01 HISTORY — DX: Paroxysmal atrial fibrillation: I48.0

## 2014-05-04 LAB — MDC_IDC_ENUM_SESS_TYPE_INCLINIC
Brady Statistic RA Percent Paced: 98 %
Lead Channel Impedance Value: 490 Ohm
Lead Channel Impedance Value: 640 Ohm
Lead Channel Setting Pacing Amplitude: 2 V
Lead Channel Setting Pacing Pulse Width: 0.5 ms
Lead Channel Setting Sensing Sensitivity: 2 mV
MDC IDC MSMT LEADCHNL LV IMPEDANCE VALUE: 630 Ohm
MDC IDC PG MODEL: 3242
MDC IDC PG SERIAL: 7724805
MDC IDC SET LEADCHNL LV PACING AMPLITUDE: 4.375
MDC IDC SET LEADCHNL LV PACING PULSEWIDTH: 0.5 ms
MDC IDC SET LEADCHNL RA PACING AMPLITUDE: 1.625
MDC IDC STAT BRADY RV PERCENT PACED: 99 %

## 2014-05-13 ENCOUNTER — Encounter: Payer: Self-pay | Admitting: Internal Medicine

## 2014-05-18 ENCOUNTER — Ambulatory Visit: Payer: Medicare Other | Admitting: Medical

## 2014-05-22 ENCOUNTER — Telehealth: Payer: Self-pay | Admitting: Medical

## 2014-05-22 ENCOUNTER — Other Ambulatory Visit: Payer: Self-pay

## 2014-05-22 MED ORDER — AMLODIPINE BESYLATE 10 MG PO TABS: 10.0000 mg | ORAL_TABLET | Freq: Every day | ORAL | Status: DC

## 2014-05-22 MED ORDER — LISINOPRIL 20 MG PO TABS: 20.0000 mg | ORAL_TABLET | Freq: Every day | ORAL | Status: DC

## 2014-05-22 MED ORDER — CARVEDILOL 6.25 MG PO TABS: 6.2500 mg | ORAL_TABLET | Freq: Two times a day (BID) | ORAL | Status: DC

## 2014-05-22 NOTE — Telephone Encounter (Signed)
Caller name: Naren Relation to pt: self Call back number: [phone] Pharmacy: [facility] [address]  Reason for call:   Patient walked into the office stating that he is out of amlodipine,lisinopril, and carvedilol and is requesting refills to be sent today.

## 2014-05-22 NOTE — Telephone Encounter (Signed)
Medications refilled for 30 days,  0 refills and sent to pharmacy.

## 2014-05-25 ENCOUNTER — Ambulatory Visit (INDEPENDENT_AMBULATORY_CARE_PROVIDER_SITE_OTHER): Payer: Medicare Other | Admitting: Medical

## 2014-05-25 ENCOUNTER — Encounter: Payer: Self-pay | Admitting: Medical

## 2014-05-25 VITALS — BP 140/80 | HR 85 | Temp 98.0°F | Ht 75.5 in | Wt 205.4 lb

## 2014-05-25 DIAGNOSIS — I25811 Atherosclerosis of native coronary artery of transplanted heart without angina pectoris: Secondary | ICD-10-CM | POA: Diagnosis not present

## 2014-05-25 DIAGNOSIS — I10 Essential (primary) hypertension: Secondary | ICD-10-CM | POA: Insufficient documentation

## 2014-05-25 DIAGNOSIS — N528 Other male erectile dysfunction: Secondary | ICD-10-CM

## 2014-05-25 NOTE — Patient Instructions (Addendum)
HTN (hypertension) Pt bp is controlled at home. Fairly good bp medication today. Check bp daily and document readings. Particularly since you want to see want to renew your dot certificate.  I sent in bp med past Friday. When you run out of med notify us and can give you refills.   Erectile dysfunction Pt wants referral to discuss option of erectile dysfunction.     Follow up 3 months or as needed.

## 2014-05-25 NOTE — Assessment & Plan Note (Addendum)
Pt bp is controlled at home. Fairly good bp medication today. Check bp daily and document readings. Particularly since you want to see want to renew your dot certificate.  I sent in bp med past Friday. When you run out of med notify us and can give you refills.

## 2014-05-25 NOTE — Assessment & Plan Note (Signed)
Pt wants referral to discuss option of erectile dysfunction.

## 2014-05-25 NOTE — Progress Notes (Signed)
Subjective:    Patient ID: Phillip Booth, male    DOB: 1951-01-02, 64 y.o.   MRN: [id number]  HPI  Pt states that he drank wine today just one glass of wine. When wife has been checking his bp uncontrolled most of time 120-130 a lot of time in systolic. One time systolic 629. Most of time systolic 528-413. Pt diastolic he can't remember. He did not bring in the readings. Pt does have cardiologist and has pacemaker.   Pt has no cardiac or neurologic signs or symptoms.    Review of Systems  Constitutional: Negative for fever, chills, diaphoresis, activity change and fatigue.  Respiratory: Negative for cough, chest tightness and shortness of breath.   Cardiovascular: Negative for chest pain, palpitations and leg swelling.  Gastrointestinal: Negative for nausea, vomiting and abdominal pain.  Musculoskeletal: Negative for neck pain and neck stiffness.  Neurological: Negative for dizziness, tremors, seizures, syncope, facial asymmetry, speech difficulty, weakness, light-headedness, numbness and headaches.  Psychiatric/Behavioral: Negative for behavioral problems, confusion and agitation. The patient is not nervous/anxious.     Past Medical History  Diagnosis Date  . CVA (cerebral infarction)     a. diagnosis not clear  . Hypertension   . Hyperlipidemia   . Vertigo   . Symptomatic bradycardia     a. s/p STJ CRTP implanted by Dr Rayann Heman  . CAD (coronary artery disease)     a. moderate by cath 03/2014  . Non-ischemic cardiomyopathy     a. EF 45% by echo 03/2014  . Allergy   . Cataract     History   Social History  . Marital Status: Married    Spouse Name: N/A  . Number of Children: N/A  . Years of Education: N/A   Occupational History  . Not on file.   Social History Main Topics  . Smoking status: Never Smoker   . Smokeless tobacco: Never Used  . Alcohol Use: Yes     Comment: occasional  . Drug Use: No  . Sexual Activity: Yes   Other Topics Concern  . Not on file    Social History Narrative    Past Surgical History  Procedure Laterality Date  . Abdominal surgery    . Hernia repair    . Appendectomy    . Left heart catheterization with coronary angiogram N/A 03/19/2014    Procedure: LEFT HEART CATHETERIZATION WITH CORONARY ANGIOGRAM;  Surgeon: [name], MD;  Location: [facility] CATH LAB;  Service: Cardiovascular;  Laterality: N/A;  . Bi-ventricular pacemaker insertion N/A 03/19/2014    STJ CRTP implanted by Dr Rayann Heman    Family History  Problem Relation Age of Onset  . Hypertension Mother   . Cancer Mother     brain (possibly started in lung)  . Heart disease Mother   . Diabetes Mother   . Other Mother     cardiomegaly  . Hypertension Father   . Emphysema Father   . Heart Problems Brother     pacemaker  . Heart Problems Other     "using 10% of heart"    No Known Allergies  Current Outpatient Prescriptions on File Prior to Visit  Medication Sig Dispense Refill  . amLODipine (NORVASC) 10 MG tablet Take 1 tablet (10 mg total) by mouth daily. 30 tablet 0  . aspirin 81 MG tablet Take 162 mg by mouth daily.     . carvedilol (COREG) 6.25 MG tablet Take 1 tablet (6.25 mg total) by mouth 2 (two) times daily  with a meal. 60 tablet 0  . lisinopril (PRINIVIL,ZESTRIL) 20 MG tablet Take 1 tablet (20 mg total) by mouth daily. 30 tablet 0  . tamsulosin (FLOMAX) 0.4 MG CAPS capsule Take 1 capsule (0.4 mg total) by mouth daily. 30 capsule 1   No current facility-administered medications on file prior to visit.    BP 140/80 mmHg  Pulse 85  Temp(Src) 98 F (36.7 C) (Oral)  Ht 6' 3.5" (1.918 m)  Wt 205 lb 6.4 oz (93.169 kg)  BMI 25.33 kg/m2  SpO2 97%       Objective:   Physical Exam  General Mental Status- Alert. General Appearance- Not in acute distress.   Skin General: Color- Normal Color. Moisture- Normal Moisture.  Neck Carotid Arteries- Normal color. Moisture- Normal Moisture. No carotid bruits. No JVD.  Chest and Lung  Exam Auscultation: Breath Sounds:-Normal.  Cardiovascular Auscultation:Rythm- Regular. Murmurs & Other Heart Sounds:Auscultation of the heart reveals- No Murmurs.  Abdomen Inspection:-Inspeection Normal. Palpation/Percussion:Note:No mass. Palpation and Percussion of the abdomen reveal- Non Tender, Non Distended + BS, no rebound or guarding.    Neurologic Cranial Nerve exam:- CN III-XII intact(No nystagmus), symmetric smile. Strength:- 5/5 equal and symmetric strength both upper and lower extremities.     Assessment & Plan:

## 2014-05-25 NOTE — Progress Notes (Signed)
Pre visit review using our clinic review tool, if applicable. No additional management support is needed unless otherwise documented below in the visit note. 

## 2014-06-14 ENCOUNTER — Other Ambulatory Visit: Payer: Self-pay | Admitting: Medical

## 2014-06-17 ENCOUNTER — Other Ambulatory Visit: Payer: Self-pay | Admitting: Medical

## 2014-06-25 ENCOUNTER — Ambulatory Visit (INDEPENDENT_AMBULATORY_CARE_PROVIDER_SITE_OTHER): Payer: Medicare Other | Admitting: Internal Medicine

## 2014-06-25 ENCOUNTER — Encounter: Payer: Self-pay | Admitting: Internal Medicine

## 2014-06-25 ENCOUNTER — Encounter: Payer: Medicare Other | Admitting: Internal Medicine

## 2014-06-25 VITALS — BP 150/84 | HR 60 | Ht 76.0 in | Wt 205.4 lb

## 2014-06-25 DIAGNOSIS — I480 Paroxysmal atrial fibrillation: Secondary | ICD-10-CM | POA: Diagnosis not present

## 2014-06-25 DIAGNOSIS — I441 Atrioventricular block, second degree: Secondary | ICD-10-CM

## 2014-06-25 DIAGNOSIS — I429 Cardiomyopathy, unspecified: Secondary | ICD-10-CM | POA: Diagnosis not present

## 2014-06-25 DIAGNOSIS — I4891 Unspecified atrial fibrillation: Secondary | ICD-10-CM | POA: Insufficient documentation

## 2014-06-25 DIAGNOSIS — I25811 Atherosclerosis of native coronary artery of transplanted heart without angina pectoris: Secondary | ICD-10-CM

## 2014-06-25 DIAGNOSIS — I10 Essential (primary) hypertension: Secondary | ICD-10-CM

## 2014-06-25 DIAGNOSIS — I428 Other cardiomyopathies: Secondary | ICD-10-CM

## 2014-06-25 NOTE — Progress Notes (Signed)
Electrophysiology Office Note   Date:  06/25/2014   ID:  Phillip Booth, DOB [birth year], MRN [id number]  PCP:  [name], PA-C  Cardiologist:  Dr Claiborne Billings Primary Electrophysiologist: Phillip Grayer, MD    Chief Complaint  Patient presents with  . Bradycardia  . SSS  . ICM     History of Present Illness: Phillip Booth is a 64 y.o. male who presents today for electrophysiology evaluation.   Doing very well s/p CRT-P implantation.  He is very pleased with his results and denies procedure related complications.  His energy is much improved.  Presyncope resolved.   Today, he denies symptoms of palpitations, chest pain, shortness of breath, orthopnea, PND, lower extremity edema, claudication, dizziness, presyncope, syncope, bleeding, or neurologic sequela. The patient is tolerating medications without difficulties and is otherwise without complaint today.    Past Medical History  Diagnosis Date  . CVA (cerebral infarction)     a. diagnosis not clear  . Hypertension   . Hyperlipidemia   . Vertigo   . Symptomatic bradycardia     a. s/p STJ CRTP implanted by Dr Phillip Booth  . CAD (coronary artery disease)     a. moderate by cath 03/2014  . Non-ischemic cardiomyopathy     a. EF 45% by echo 03/2014  . Allergy   . Cataract    Past Surgical History  Procedure Laterality Date  . Abdominal surgery    . Hernia repair    . Appendectomy    . Left heart catheterization with coronary angiogram N/A 03/19/2014    Procedure: LEFT HEART CATHETERIZATION WITH CORONARY ANGIOGRAM;  Surgeon: Phillip Sine, MD;  Location: [facility] CATH LAB;  Service: Cardiovascular;  Laterality: N/A;  . Bi-ventricular pacemaker insertion N/A 03/19/2014    STJ CRTP implanted by Dr Phillip Booth     Current Outpatient Prescriptions  Medication Sig Dispense Refill  . amLODipine (NORVASC) 10 MG tablet TAKE 1 TABLET (10 MG TOTAL) BY MOUTH DAILY. 30 tablet 1  . aspirin 81 MG tablet Take 162 mg by mouth daily.     . carvedilol  (COREG) 6.25 MG tablet TAKE 1 TABLET (6.25 MG TOTAL) BY MOUTH 2 (TWO) TIMES DAILY WITH A MEAL. 60 tablet 1  . lisinopril (PRINIVIL,ZESTRIL) 20 MG tablet TAKE 1 TABLET (20 MG TOTAL) BY MOUTH DAILY. 30 tablet 1  . tamsulosin (FLOMAX) 0.4 MG CAPS capsule TAKE 1 CAPSULE (0.4 MG TOTAL) BY MOUTH DAILY. 30 capsule 1   No current facility-administered medications for this visit.    Allergies:   Review of patient's allergies indicates no known allergies.   Social History:  The patient  reports that he has never smoked. He has never used smokeless tobacco. He reports that he drinks alcohol. He reports that he does not use illicit drugs.   Family History:  The patient's family history includes Cancer in his mother; Diabetes in his mother; Emphysema in his father; Heart Problems in his brother and other; Heart disease in his mother; Hypertension in his father and mother; Other in his mother.    ROS:  Please see the history of present illness.   All other systems are reviewed and negative.    PHYSICAL EXAM: VS:  BP 150/84 mmHg  Pulse 60  Ht 6\' 4"  (1.93 m)  Wt 93.169 kg (205 lb 6.4 oz)  BMI 25.01 kg/m2 , BMI Body mass index is 25.01 kg/(m^2). GEN: Well nourished, well developed, in no acute distress HEENT: normal Neck: no JVD, carotid bruits, or masses  Cardiac: RRR; no murmurs, rubs, or gallops,no edema  Respiratory:  clear to auscultation bilaterally, normal work of breathing GI: soft, nontender, nondistended, + BS MS: no deformity or atrophy Skin: warm and dry, device pocket is well healed Neuro:  Strength and sensation are intact Psych: euthymic mood, full affect  EKG:  EKG is ordered today. The ekg ordered today shows sinus rhythm with BiV pacing (QRS 180 msec)  Device interrogation is reviewed today in detail.  See PaceArt for details.   Recent Labs: 03/18/2014: ALT 40; TSH 1.693 03/19/2014: B Natriuretic Peptide 247.6*; Hemoglobin 13.2; Platelets 174 03/20/2014: BUN 8; Creatinine, Ser  0.90; Potassium 4.0; Sodium 136    Lipid Panel     Component Value Date/Time   CHOL 231* 03/18/2014 0815   TRIG 58 03/18/2014 0815   HDL 57 03/18/2014 0815   CHOLHDL 4.1 03/18/2014 0815   VLDL 12 03/18/2014 0815   LDLCALC 162* 03/18/2014 0815     Wt Readings from Last 3 Encounters:  06/25/14 93.169 kg (205 lb 6.4 oz)  05/25/14 93.169 kg (205 lb 6.4 oz)  04/16/14 93.169 kg (205 lb 6.4 oz)      ASSESSMENT AND PLAN:  1.  Sick sinus syndrome/ second degree AV block Normal CRT-P function See Pace Art report No changes today  2. Nonischemic CM Stable Would repeat echo in 3-4 mnths 2 gram sodium diet  3. afib Documented on PPM interrogation today chads2vasc score is at least 6 I would therefore advise anticoagulation At this time, he would like to consider this further   4. HTN Stable No change required today 2 gram sodium diet  Follow-up: enroll in ICM device clinic with Marica Otter Return to see EP NP in 1 year Follow-up with Dr Claiborne Billings a scheduled  Current medicines are reviewed at length with the patient today.   The patient does not have concerns regarding his medicines.  The following changes were made today:  none  Signed, Phillip Grayer, MD  06/25/2014 7:38 PM     [place] [address] [phone] (office) [phone] (fax)

## 2014-06-25 NOTE — Patient Instructions (Addendum)
Medication Instructions:  Your physician recommends that you continue on your current medications as directed. Please refer to the Current Medication list given to you today.   Labwork: None ordered  Testing/Procedures: None ordered  Follow-Up: Your physician wants you to follow-up in: 12 months with Chanetta Marshall, NP You will receive a reminder letter in the mail two months in advance. If you don't receive a letter, please call our office to schedule the follow-up appointment.   Remote monitoring is used to monitor your Pacemaker or ICD from home. This monitoring reduces the number of office visits required to check your device to one time per year. It allows [name] to keep an eye on the functioning of your device to ensure it is working properly. You are scheduled for a device check from home on 09/24/14. You may send your transmission at any time that day. If you have a wireless device, the transmission will be sent automatically. After your physician reviews your transmission, you will receive a postcard with your next transmission date.  ICM with Alvis Lemmings, RN  Any Other Special Instructions Will Be Listed Below (If Applicable).    Low-Sodium Eating Plan Sodium raises blood pressure and causes water to be held in the body. Getting less sodium from food will help lower your blood pressure, reduce any swelling, and protect your heart, liver, and kidneys. We get sodium by adding salt (sodium chloride) to food. Most of our sodium comes from canned, boxed, and frozen foods. Restaurant foods, fast foods, and pizza are also very high in sodium. Even if you take medicine to lower your blood pressure or to reduce fluid in your body, getting less sodium from your food is important. WHAT IS MY PLAN? Most people should limit their sodium intake to 2,300 mg a day. Your health care provider recommends that you limit your sodium intake to 2 grams a day.  WHAT DO I NEED TO KNOW ABOUT THIS EATING  PLAN? For the low-sodium eating plan, you will follow these general guidelines:  Choose foods with a % Daily Value for sodium of less than 5% (as listed on the food label).   Use salt-free seasonings or herbs instead of table salt or sea salt.   Check with your health care provider or pharmacist before using salt substitutes.   Eat fresh foods.  Eat more vegetables and fruits.  Limit canned vegetables. If you do use them, rinse them well to decrease the sodium.   Limit cheese to 1 oz (28 g) per day.   Eat lower-sodium products, often labeled as "lower sodium" or "no salt added."  Avoid foods that contain monosodium glutamate (MSG). MSG is sometimes added to [place] food and some canned foods.  Check food labels (Nutrition Facts labels) on foods to learn how much sodium is in one serving.  Eat more home-cooked food and less restaurant, buffet, and fast food.  When eating at a restaurant, ask that your food be prepared with less salt or none, if possible.  HOW DO I READ FOOD LABELS FOR SODIUM INFORMATION? The Nutrition Facts label lists the amount of sodium in one serving of the food. If you eat more than one serving, you must multiply the listed amount of sodium by the number of servings. Food labels may also identify foods as:  Sodium free--Less than 5 mg in a serving.  Very low sodium--35 mg or less in a serving.  Low sodium--140 mg or less in a serving.  Light in sodium--50%  less sodium in a serving. For example, if a food that usually has 300 mg of sodium is changed to become light in sodium, it will have 150 mg of sodium.  Reduced sodium--25% less sodium in a serving. For example, if a food that usually has 400 mg of sodium is changed to reduced sodium, it will have 300 mg of sodium. WHAT FOODS CAN I EAT? Grains Low-sodium cereals, including oats, puffed wheat and rice, and shredded wheat cereals. Low-sodium crackers. Unsalted rice and pasta. Lower-sodium  bread.  Vegetables Frozen or fresh vegetables. Low-sodium or reduced-sodium canned vegetables. Low-sodium or reduced-sodium tomato sauce and paste. Low-sodium or reduced-sodium tomato and vegetable juices.  Fruits Fresh, frozen, and canned fruit. Fruit juice.  Meat and Other Protein Products Low-sodium canned tuna and salmon. Fresh or frozen meat, poultry, seafood, and fish. Lamb. Unsalted nuts. Dried beans, peas, and lentils without added salt. Unsalted canned beans. Homemade soups without salt. Eggs.  Dairy Milk. Soy milk. Ricotta cheese. Low-sodium or reduced-sodium cheeses. Yogurt.  Condiments Fresh and dried herbs and spices. Salt-free seasonings. Onion and garlic powders. Low-sodium varieties of mustard and ketchup. Lemon juice.  Fats and Oils Reduced-sodium salad dressings. Unsalted butter.  Other Unsalted popcorn and pretzels.  The items listed above may not be a complete list of recommended foods or beverages. Contact your dietitian for more options. WHAT FOODS ARE NOT RECOMMENDED? Grains Instant hot cereals. Bread stuffing, pancake, and biscuit mixes. Croutons. Seasoned rice or pasta mixes. Noodle soup cups. Boxed or frozen macaroni and cheese. Self-rising flour. Regular salted crackers. Vegetables Regular canned vegetables. Regular canned tomato sauce and paste. Regular tomato and vegetable juices. Frozen vegetables in sauces. Salted french fries. Olives. Angie Fava. Relishes. Sauerkraut. Salsa. Meat and Other Protein Products Salted, canned, smoked, spiced, or pickled meats, seafood, or fish. Bacon, ham, sausage, hot dogs, corned beef, chipped beef, and packaged luncheon meats. Salt pork. Jerky. Pickled herring. Anchovies, regular canned tuna, and sardines. Salted nuts. Dairy Processed cheese and cheese spreads. Cheese curds. Blue cheese and cottage cheese. Buttermilk.  Condiments Onion and garlic salt, seasoned salt, table salt, and sea salt. Canned and packaged  gravies. Worcestershire sauce. Tartar sauce. Barbecue sauce. Teriyaki sauce. Soy sauce, including reduced sodium. Steak sauce. Fish sauce. Oyster sauce. Cocktail sauce. Horseradish. Regular ketchup and mustard. Meat flavorings and tenderizers. Bouillon cubes. Hot sauce. Tabasco sauce. Marinades. Taco seasonings. Relishes. Fats and Oils Regular salad dressings. Salted butter. Margarine. Ghee. Bacon fat.  Other Potato and tortilla chips. Corn chips and puffs. Salted popcorn and pretzels. Canned or dried soups. Pizza. Frozen entrees and pot pies.  The items listed above may not be a complete list of foods and beverages to avoid. Contact your dietitian for more information. Document Released: 06/17/2001 Document Revised: 12/31/2012 Document Reviewed: 10/30/2012 [facility] Patient Information 2015 [place], [place]. This information is not intended to replace advice given to you by your health care provider. Make sure you discuss any questions you have with your health care provider.

## 2014-06-26 ENCOUNTER — Telehealth: Payer: Self-pay | Admitting: Medical

## 2014-06-26 NOTE — Telephone Encounter (Signed)
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

## 2014-06-29 ENCOUNTER — Encounter: Payer: Self-pay | Admitting: *Deleted

## 2014-06-29 NOTE — Telephone Encounter (Signed)
Pt to get note from DOT certified medical examiner.

## 2014-06-30 ENCOUNTER — Other Ambulatory Visit: Payer: Self-pay | Admitting: Medical

## 2014-06-30 NOTE — Telephone Encounter (Signed)
Left message for patient to call me back regarding clearance.

## 2014-07-01 NOTE — Telephone Encounter (Signed)
rx refill of flomax.

## 2014-07-08 ENCOUNTER — Encounter: Payer: Medicare Other | Admitting: Internal Medicine

## 2014-07-10 ENCOUNTER — Encounter: Payer: Self-pay | Admitting: Medical

## 2014-07-27 ENCOUNTER — Ambulatory Visit (INDEPENDENT_AMBULATORY_CARE_PROVIDER_SITE_OTHER): Payer: Medicare Other | Admitting: *Deleted

## 2014-07-27 ENCOUNTER — Telehealth: Payer: Self-pay | Admitting: Cardiology

## 2014-07-27 ENCOUNTER — Telehealth: Payer: Self-pay | Admitting: Internal Medicine

## 2014-07-27 DIAGNOSIS — I428 Other cardiomyopathies: Secondary | ICD-10-CM

## 2014-07-27 DIAGNOSIS — I429 Cardiomyopathy, unspecified: Secondary | ICD-10-CM

## 2014-07-27 DIAGNOSIS — Z950 Presence of cardiac pacemaker: Secondary | ICD-10-CM | POA: Diagnosis not present

## 2014-07-27 NOTE — Telephone Encounter (Signed)
Follow Up   4. Are you calling to see if we received your device transmission? Y  Please call

## 2014-07-27 NOTE — Telephone Encounter (Signed)
Informed pt that his transmission has not been received. I explained to pt how to send remote transmission.

## 2014-07-27 NOTE — Telephone Encounter (Signed)
Spoke with pt and reminded pt of remote transmission that is due today. Pt verbalized understanding.   

## 2014-07-30 ENCOUNTER — Telehealth: Payer: Self-pay | Admitting: *Deleted

## 2014-07-30 NOTE — Telephone Encounter (Signed)
ICM transmission received. I left a message for the patient to call. 

## 2014-07-31 ENCOUNTER — Encounter: Payer: Self-pay | Admitting: *Deleted

## 2014-07-31 NOTE — Telephone Encounter (Signed)
I spoke with the patient. 

## 2014-07-31 NOTE — Progress Notes (Signed)
EPIC Encounter for ICM Monitoring  Patient Name: Phillip Booth is a 64 y.o. male Date: 07/31/2014 Primary Care Physican: [name], PA-C Primary Cardiologist: Claiborne Billings Electrophysiologist: Allred Dry Weight: 213 lbs  Bi-V pacing: > 99%       In the past month, have you:  1. Gained more than 2 pounds in a day or more than 5 pounds in a week? Uncertain- he does not weight regularly.  2. Had changes in your medications (with verification of current medications)? no  3. Had more shortness of breath than is usual for you? no  4. Limited your activity because of shortness of breath? no  5. Not been able to sleep because of shortness of breath? no  6. Had increased swelling in your feet or ankles? no  7. Had symptoms of dehydration (dizziness, dry mouth, increased thirst, decreased urine output) no  8. Had changes in sodium restriction? no  9. Been compliant with medication? Yes   ICM trend:   Follow-up plan: ICM clinic phone appointment: 08/31/14. This is my first ICM encounter with the patient. His impedence was slightly below baseline the last 2 weeks of June. He reports that he had some slight swelling to his feet over the last few weeks, but this was associated with walking. He has had some sinus congestion with the heat and humidity. He did have one episode of dizziness last week, but he was outside and did not have enough water with him. He is currently without complaints today. No changes made.  Copy of note sent to patient's primary care physician, primary cardiologist, and device following physician.  Alvis Lemmings, RN, BSN 07/31/2014 4:14 PM

## 2014-08-14 ENCOUNTER — Telehealth: Payer: Self-pay | Admitting: Medical

## 2014-08-14 MED ORDER — LISINOPRIL 20 MG PO TABS: 20.0000 mg | ORAL_TABLET | Freq: Every day | ORAL | Status: DC

## 2014-08-14 NOTE — Telephone Encounter (Signed)
Refilled lisinopril

## 2014-08-16 ENCOUNTER — Other Ambulatory Visit: Payer: Self-pay | Admitting: Medical

## 2014-08-25 ENCOUNTER — Telehealth: Payer: Self-pay | Admitting: Medical

## 2014-08-25 NOTE — Telephone Encounter (Signed)
Patient wants Dr. Harvie Heck to know that his cardiologist has approve for him to starting taking the erectile dysfunction medication discussed at last visit.  Would like prescription called the [facility]am on file. [phone]

## 2014-08-26 MED ORDER — SILDENAFIL CITRATE 25 MG PO TABS: 25.0000 mg | ORAL_TABLET | Freq: Every day | ORAL | Status: DC | PRN

## 2014-08-26 NOTE — Telephone Encounter (Signed)
Pt states his cardilogist ok'd for him to take erectile dysfunction tabs. I reviewed his note. No note of any ischemia of heart. Will rx low dose viagra.

## 2014-08-27 NOTE — Telephone Encounter (Signed)
Left message for pt to call back  °

## 2014-08-27 NOTE — Telephone Encounter (Signed)
Spoke with pt and he voices understanding.  

## 2014-08-30 ENCOUNTER — Other Ambulatory Visit: Payer: Self-pay | Admitting: Medical

## 2014-08-31 ENCOUNTER — Telehealth: Payer: Self-pay

## 2014-08-31 ENCOUNTER — Ambulatory Visit (INDEPENDENT_AMBULATORY_CARE_PROVIDER_SITE_OTHER): Payer: Medicare Other | Admitting: *Deleted

## 2014-08-31 ENCOUNTER — Encounter: Payer: Self-pay | Admitting: Medical

## 2014-08-31 ENCOUNTER — Ambulatory Visit (INDEPENDENT_AMBULATORY_CARE_PROVIDER_SITE_OTHER): Payer: Medicare Other | Admitting: Medical

## 2014-08-31 ENCOUNTER — Ambulatory Visit (HOSPITAL_BASED_OUTPATIENT_CLINIC_OR_DEPARTMENT_OTHER)
Admission: RE | Admit: 2014-08-31 | Discharge: 2014-08-31 | Disposition: A | Discharge status: Home / Self Care | Payer: Medicare Other | Source: Ambulatory Visit | Attending: Medical | Admitting: Medical

## 2014-08-31 VITALS — BP 130/80 | HR 84 | Temp 98.1°F | Ht 76.0 in | Wt 214.6 lb

## 2014-08-31 DIAGNOSIS — I429 Cardiomyopathy, unspecified: Secondary | ICD-10-CM

## 2014-08-31 DIAGNOSIS — I10 Essential (primary) hypertension: Secondary | ICD-10-CM

## 2014-08-31 DIAGNOSIS — I428 Other cardiomyopathies: Secondary | ICD-10-CM

## 2014-08-31 DIAGNOSIS — R0600 Dyspnea, unspecified: Secondary | ICD-10-CM | POA: Diagnosis not present

## 2014-08-31 DIAGNOSIS — N400 Enlarged prostate without lower urinary tract symptoms: Secondary | ICD-10-CM | POA: Diagnosis not present

## 2014-08-31 DIAGNOSIS — R0602 Shortness of breath: Secondary | ICD-10-CM | POA: Insufficient documentation

## 2014-08-31 DIAGNOSIS — I25811 Atherosclerosis of native coronary artery of transplanted heart without angina pectoris: Secondary | ICD-10-CM

## 2014-08-31 DIAGNOSIS — Z8679 Personal history of other diseases of the circulatory system: Secondary | ICD-10-CM | POA: Diagnosis not present

## 2014-08-31 DIAGNOSIS — Z95810 Presence of automatic (implantable) cardiac defibrillator: Secondary | ICD-10-CM | POA: Diagnosis not present

## 2014-08-31 DIAGNOSIS — Z950 Presence of cardiac pacemaker: Secondary | ICD-10-CM | POA: Diagnosis not present

## 2014-08-31 NOTE — Telephone Encounter (Signed)
Spoke with pt and reminded pt of remote transmission that is due today. Pt verbalized understanding.   

## 2014-08-31 NOTE — Addendum Note (Signed)
Addended by: Anabel Halon on: 08/31/2014 03:01 PM   Modules accepted: Orders

## 2014-08-31 NOTE — Progress Notes (Signed)
Subjective:    Patient ID: Phillip Booth, male    DOB: January 29, 1950, 64 y.o.   MRN: [id number]  HPI   Pt in for follow up. Pt weight up today. But he mentioned that is very close to his baseline weight. He states no obvious dyspnea. He states heat makes him feel occasional mild rare shortness of breath.  No recent chest pain. Pt has not been exercising. Pt states for cardio work up his cardiologist told him he good do stationary bike.  Pt htn was stable on 06-25-2014. This is day he saw cardiologist.  Pt has hx of pacemaker.  Also hx of cardiomyopathy non ischemic.  Pt has appointment with urologist end of this month.    Review of Systems  Constitutional: Negative for fever, chills, diaphoresis and fatigue.  Respiratory: Positive for shortness of breath. Negative for cough, chest tightness and wheezing.        Rare occasional dyspnea. None recently.  Cardiovascular: Negative for chest pain and palpitations.  Gastrointestinal: Negative for abdominal pain.  Genitourinary: Negative for dysuria, frequency, flank pain, decreased urine volume, scrotal swelling, penile pain and testicular pain.  Musculoskeletal: Negative for back pain.  Neurological: Negative for dizziness, syncope, weakness, numbness and headaches.  Hematological: Negative for adenopathy. Does not bruise/bleed easily.      Past Medical History  Diagnosis Date  . CVA (cerebral infarction)     a. diagnosis not clear  . Hypertension   . Hyperlipidemia   . Vertigo   . Symptomatic bradycardia     a. s/p STJ CRTP implanted by Dr Rayann Heman  . CAD (coronary artery disease)     a. moderate by cath 03/2014  . Non-ischemic cardiomyopathy     a. EF 45% by echo 03/2014  . Allergy   . Cataract   . Paroxysmal atrial fibrillation 05/01/2014    asymptomatic, documented on PPM interrogation,  chads2vasc score is at least 6.  He is contemplating anticoagulation    Social History   Social History  . Marital Status: Married   Spouse Name: N/A  . Number of Children: N/A  . Years of Education: N/A   Occupational History  . Not on file.   Social History Main Topics  . Smoking status: Never Smoker   . Smokeless tobacco: Never Used  . Alcohol Use: Yes     Comment: occasional  . Drug Use: No  . Sexual Activity: Yes   Other Topics Concern  . Not on file   Social History Narrative    Past Surgical History  Procedure Laterality Date  . Abdominal surgery    . Hernia repair    . Appendectomy    . Left heart catheterization with coronary angiogram N/A 03/19/2014    Procedure: LEFT HEART CATHETERIZATION WITH CORONARY ANGIOGRAM;  Surgeon: [name], MD;  Location[facility] CATH LAB;  Service: Cardiovascular;  Laterality: N/A;  . Bi-ventricular pacemaker insertion N/A 03/19/2014    STJ CRTP implanted by Dr Rayann Heman    Family History  Problem Relation Age of Onset  . Hypertension Mother   . Cancer Mother     brain (possibly started in lung)  . Heart disease Mother   . Diabetes Mother   . Other Mother     cardiomegaly  . Hypertension Father   . Emphysema Father   . Heart Problems Brother     pacemaker  . Heart Problems Other     "using 10% of heart"    No Known Allergies  Current Outpatient Prescriptions on File Prior to Visit  Medication Sig Dispense Refill  . amLODipine (NORVASC) 10 MG tablet TAKE 1 TABLET (10 MG TOTAL) BY MOUTH DAILY. 30 tablet 0  . aspirin 81 MG tablet Take 162 mg by mouth daily.     . carvedilol (COREG) 6.25 MG tablet TAKE 1 TABLET (6.25 MG TOTAL) BY MOUTH 2 (TWO) TIMES DAILY WITH A MEAL. 60 tablet 0  . lisinopril (PRINIVIL,ZESTRIL) 20 MG tablet Take 1 tablet (20 mg total) by mouth daily. 90 tablet 0  . lisinopril (PRINIVIL,ZESTRIL) 20 MG tablet TAKE 1 TABLET (20 MG TOTAL) BY MOUTH DAILY. 30 tablet 0  . sildenafil (VIAGRA) 25 MG tablet Take 1 tablet (25 mg total) by mouth daily as needed for erectile dysfunction (1 tab po as needed prior to sex. max 1 tab po q day). 10 tablet 0    . tamsulosin (FLOMAX) 0.4 MG CAPS capsule TAKE 1 CAPSULE (0.4 MG TOTAL) BY MOUTH DAILY. 30 capsule 1   No current facility-administered medications on file prior to visit.    BP 148/91 mmHg  Pulse 84  Temp(Src) 98.1 F (36.7 C) (Oral)  Ht 6\' 4"  (1.93 m)  Wt 214 lb 9.6 oz (97.342 kg)  BMI 26.13 kg/m2  SpO2 99%       Objective:   Physical Exam General Mental Status- Alert. General Appearance- Not in acute distress.   Skin General: Color- Normal Color. Moisture- Normal Moisture.  Neck Carotid Arteries- Normal color. Moisture- Normal Moisture. No carotid bruits. No JVD.  Chest and Lung Exam Auscultation: Breath Sounds:-Normal. CTA  Cardiovascular Auscultation:Rythm- Regular, Rate and Rhythm. Murmurs & Other Heart Sounds:Auscultation of the heart reveals- No Murmurs.  Abdomen Inspection:-Inspeection Normal. Palpation/Percussion:Note:No mass. Palpation and Percussion of the abdomen reveal- Non Tender, Non Distended + BS, no rebound or guarding.    Neurologic Cranial Nerve exam:- CN III-XII intact(No nystagmus), symmetric smile. Strength:- 5/5 equal and symmetric strength both upper and lower extremities.  Lower ext- lower ext no pedal edema, neg homans signs.      Assessment & Plan:  Will continue on same bp medications.  For your dyspnea and history of cardiomyopathy as well as weight gain will get cxr + bnp.  Please attend the urologist appointment end of this month.  I did rx low dose viagra since you sent message stating you could use viagra type med. But only wrote very low dose. Do not use any more than 25 mg dose. And use only one time a day at most.  Follow up 3 month or as needed

## 2014-08-31 NOTE — Telephone Encounter (Signed)
Call to patient on mobile phone.  He stated he was driving to see his PCP.  I stated I would call him back tomorrow.

## 2014-08-31 NOTE — Progress Notes (Signed)
Pre visit review using our clinic review tool, if applicable. No additional management support is needed unless otherwise documented below in the visit note. 

## 2014-08-31 NOTE — Telephone Encounter (Signed)
Rx refill flomax.

## 2014-08-31 NOTE — Patient Instructions (Addendum)
Will continue on same bp medications.  For your dyspnea and history of cardiomyopathy as well as weight gain will get cxr + bnp.  Please attend the urologist appointment end of this month.  I did rx low dose viagra since you sent message stating you could use viagra type med. But only wrote very low dose. Do not use any more than 25 mg dose. And use only one time a day at most.  Follow up 3 month or as needed

## 2014-09-01 LAB — COMPREHENSIVE METABOLIC PANEL
ALT: 18 U/L (ref 0–53)
AST: 22 U/L (ref 0–37)
Albumin: 3.9 g/dL (ref 3.5–5.2)
Alkaline Phosphatase: 61 U/L (ref 39–117)
BUN: 14 mg/dL (ref 6–23)
CHLORIDE: 103 meq/L (ref 96–112)
CO2: 29 mEq/L (ref 19–32)
Calcium: 9.7 mg/dL (ref 8.4–10.5)
Creatinine, Ser: 0.93 mg/dL (ref 0.40–1.50)
GFR: 104.51 mL/min (ref >60.00)
Glucose, Bld: 86 mg/dL (ref 70–99)
POTASSIUM: 4.1 meq/L (ref 3.5–5.1)
SODIUM: 137 meq/L (ref 135–145)
Total Bilirubin: 0.5 mg/dL (ref 0.2–1.2)
Total Protein: 7.5 g/dL (ref 6.0–8.3)

## 2014-09-01 LAB — BRAIN NATRIURETIC PEPTIDE: PRO B NATRI PEPTIDE: 102 pg/mL — AB (ref 0.0–100.0)

## 2014-09-01 NOTE — Progress Notes (Signed)
EPIC Encounter for ICM Monitoring  Patient Name: Phillip Booth is a 64 y.o. male Date: 09/01/2014 Primary Care Physican: [name], PA-C Primary Cardiologist: Claiborne Billings Electrophysiologist: Allred Dry Weight: 214 lbs       In the past month, have you:  1. Gained more than 2 pounds in a day or more than 5 pounds in a week? no  2. Had changes in your medications (with verification of current medications)? no  3. Had more shortness of breath than is usual for you? no  4. Limited your activity because of shortness of breath? no  5. Not been able to sleep because of shortness of breath? no  6. Had increased swelling in your feet or ankles? no  7. Had symptoms of dehydration (dizziness, dry mouth, increased thirst, decreased urine output) no  8. Had changes in sodium restriction? no  9. Been compliant with medication? Yes   ICM trend:   Follow-up plan: ICM clinic phone appointment 10/26/2014.  Appointment scheduled for Chanetta Marshall, NP on 09/23/2014 .  Patient stated he is doing well.  He stated he has some shortness of breath when it is hot and humid outside but resolves once he is inside.  No changes today.   Copy of note sent to patient's primary care physician, primary cardiologist, and device following physician.  Rosalene Billings, RN, CCM 09/01/2014 12:23 PM

## 2014-09-01 NOTE — Telephone Encounter (Signed)
Spoke with patient.

## 2014-09-12 ENCOUNTER — Other Ambulatory Visit: Payer: Self-pay | Admitting: Family Medicine

## 2014-09-22 NOTE — Progress Notes (Signed)
Electrophysiology Office Note Date: 09/23/2014  ID:  Phillip Booth, DOB 01-01-1951, MRN [id number]  PCP: Elise Benne Primary Cardiologist: Claiborne Billings Electrophysiologist: Allred  CC: Atrial fibrillation and HF follow-up  Phillip Booth is a 64 y.o. male seen today for Dr Rayann Heman. He underwent CRTP implantation in March of 2016 and has had significant functional improvement in symptoms. At last office visit, AF was detected on device interrogation and he presents today for further evaluation of AF burden.  Since last being seen in our clinic, the patient reports doing reasonably well.  He has had worsening shortness of breath and abdominal distention. He reports marginal compliance with low salt diet. He is short of breath with moderate exertion and is Class 2b today.  He denies chest pain, palpitations, PND, orthopnea, nausea, vomiting, dizziness, syncope, edema, weight gain, or early satiety.  Device History: STJ CRTP implanted 2016 for NICM, CHF   Past Medical History  Diagnosis Date  . CVA (cerebral infarction)     a. diagnosis not clear  . Hypertension   . Hyperlipidemia   . Vertigo   . Symptomatic bradycardia     a. s/p STJ CRTP implanted by Dr Rayann Heman  . CAD (coronary artery disease)     a. moderate by cath 03/2014  . Non-ischemic cardiomyopathy     a. EF 45% by echo 03/2014  . Allergy   . Cataract   . Paroxysmal atrial fibrillation 05/01/2014    asymptomatic, documented on PPM interrogation,  chads2vasc score is at least 6.  He is contemplating anticoagulation   Past Surgical History  Procedure Laterality Date  . Abdominal surgery    . Hernia repair    . Appendectomy    . Left heart catheterization with coronary angiogram N/A 03/19/2014    Procedure: LEFT HEART CATHETERIZATION WITH CORONARY ANGIOGRAM;  Surgeon: [name], MD;  Location: [facility] CATH LAB;  Service: Cardiovascular;  Laterality: N/A;  . Bi-ventricular pacemaker insertion N/A 03/19/2014    STJ CRTP  implanted by Dr Rayann Heman    Current Outpatient Prescriptions  Medication Sig Dispense Refill  . amLODipine (NORVASC) 10 MG tablet TAKE 1 TABLET (10 MG TOTAL) BY MOUTH DAILY. 30 tablet 2  . aspirin 81 MG tablet Take 162 mg by mouth daily.     . carvedilol (COREG) 6.25 MG tablet TAKE 1 TABLET (6.25 MG TOTAL) BY MOUTH 2 (TWO) TIMES DAILY WITH A MEAL. 60 tablet 2  . lisinopril (PRINIVIL,ZESTRIL) 20 MG tablet Take 1 tablet (20 mg total) by mouth daily. 90 tablet 0  . sildenafil (VIAGRA) 25 MG tablet Take 1 tablet (25 mg total) by mouth daily as needed for erectile dysfunction (1 tab po as needed prior to sex. max 1 tab po q day). 10 tablet 0  . tamsulosin (FLOMAX) 0.4 MG CAPS capsule TAKE 1 CAPSULE (0.4 MG TOTAL) BY MOUTH DAILY. 30 capsule 2  . furosemide (LASIX) 20 MG tablet Take 1 tablet (20 mg total) by mouth daily. 90 tablet 3   No current facility-administered medications for this visit.    Allergies:   Review of patient's allergies indicates no known allergies.   Social History: Social History   Social History  . Marital Status: Married    Spouse Name: N/A  . Number of Children: N/A  . Years of Education: N/A   Occupational History  . Not on file.   Social History Main Topics  . Smoking status: Never Smoker   . Smokeless tobacco: Never Used  .  Alcohol Use: Yes     Comment: occasional  . Drug Use: No  . Sexual Activity: Yes   Other Topics Concern  . Not on file   Social History Narrative    Family History: Family History  Problem Relation Age of Onset  . Hypertension Mother   . Cancer Mother     brain (possibly started in lung)  . Heart disease Mother   . Diabetes Mother   . Other Mother     cardiomegaly  . Hypertension Father   . Emphysema Father   . Heart Problems Brother     pacemaker  . Heart Problems Other     "using 10% of heart"     Review of Systems: All other systems reviewed and are otherwise negative except as noted above.   Physical  Exam: VS:  BP 114/80 mmHg  Pulse 61  Ht 6\' 4"  (1.93 m)  Wt 212 lb (96.163 kg)  BMI 25.82 kg/m2  SpO2 97% , BMI Body mass index is 25.82 kg/(m^2).  GEN- The patient is well appearing, alert and oriented x 3 today.  HEENT: normocephalic, atraumatic; sclera clear, conjunctiva pink; hearing intact; oropharynx clear; neck supple Lymph- no cervical lymphadenopathy Lungs- Clear to ausculation bilaterally, normal work of breathing. No wheezes, rales, rhonchi Heart- Regular rate and rhythm, 2/6 SEM GI- soft, non-tender, non-distended, bowel sounds present Extremities- no clubbing, cyanosis, or edema; DP/PT/radial pulses 2+ bilaterally MS- no significant deformity or atrophy Skin- warm and dry, no rash or lesion; PPM pocket well healed Psych- euthymic mood, full affect Neuro- strength and sensation are intact  Wt Readings from Last 3 Encounters:  09/23/14 212 lb (96.163 kg)  08/31/14 214 lb 9.6 oz (97.342 kg)  06/25/14 205 lb 6.4 oz (93.169 kg)    PPM Interrogation- reviewed in detail today,  See PACEART report  EKG:  EKG is not ordered today.  Recent Labs: 03/18/2014: TSH 1.693 03/19/2014: B Natriuretic Peptide 247.6*; Hemoglobin 13.2; Platelets 174 08/31/2014: ALT 18; BUN 14; Creatinine, Ser 0.93; Potassium 4.1; Pro B Natriuretic peptide (BNP) 102.0*; Sodium 137   Wt Readings from Last 3 Encounters:  09/23/14 212 lb (96.163 kg)  08/31/14 214 lb 9.6 oz (97.342 kg)  06/25/14 205 lb 6.4 oz (93.169 kg)     Other studies Reviewed: Additional studies/ records that were reviewed today include: Dr Jackalyn Lombard last office note  Assessment and Plan:  1.  Sick sinus syndrome/Mobitz II heart block Normal CRTP function See Pace Art report No changes today  2.  NICM/chronic systolic heart failure Slightly volume overloaded by history today Will add Lasix 20mg  daily with BMET and BP check in 1 week Continue medical therapy  3.  Paroxysmal atrial fibrillation Burden is <1%  Today and  episodes are atrial tach, not atrial fibrillation. All episodes <5 minutes CHADS2VASC is at least 6 If he has mode switches that are true atrial fibrillation, will need discussion about anticoagulation  4.  HTN Stable No change required today   Current medicines are reviewed at length with the patient today.   The patient does not have concerns regarding his medicines.  The following changes were made today:  none  Labs/ tests ordered today include:  Orders Placed This Encounter  Procedures  . Basic Metabolic Panel (BMET)     Disposition:   Follow up with [place], [place] clinic, follow up with me in 1 year   Signed, Chanetta Marshall, NP 09/23/2014 2:32 PM  [place]  [address] [place] [address] [phone] (office) [phone] (fax)

## 2014-09-23 ENCOUNTER — Ambulatory Visit (INDEPENDENT_AMBULATORY_CARE_PROVIDER_SITE_OTHER): Payer: Medicare Other | Admitting: Nurse Practitioner

## 2014-09-23 ENCOUNTER — Encounter: Payer: Self-pay | Admitting: Nurse Practitioner

## 2014-09-23 VITALS — BP 114/80 | HR 61 | Ht 76.0 in | Wt 212.0 lb

## 2014-09-23 DIAGNOSIS — I5022 Chronic systolic (congestive) heart failure: Secondary | ICD-10-CM | POA: Diagnosis not present

## 2014-09-23 DIAGNOSIS — I495 Sick sinus syndrome: Secondary | ICD-10-CM

## 2014-09-23 DIAGNOSIS — I10 Essential (primary) hypertension: Secondary | ICD-10-CM

## 2014-09-23 DIAGNOSIS — I480 Paroxysmal atrial fibrillation: Secondary | ICD-10-CM | POA: Diagnosis not present

## 2014-09-23 DIAGNOSIS — I428 Other cardiomyopathies: Secondary | ICD-10-CM

## 2014-09-23 DIAGNOSIS — I25811 Atherosclerosis of native coronary artery of transplanted heart without angina pectoris: Secondary | ICD-10-CM | POA: Diagnosis not present

## 2014-09-23 DIAGNOSIS — I429 Cardiomyopathy, unspecified: Secondary | ICD-10-CM

## 2014-09-23 LAB — BASIC METABOLIC PANEL
BUN: 15 mg/dL (ref 6–23)
CALCIUM: 9.9 mg/dL (ref 8.4–10.5)
CO2: 28 meq/L (ref 19–32)
CREATININE: 0.81 mg/dL (ref 0.40–1.50)
Chloride: 104 mEq/L (ref 96–112)
GFR: 122.55 mL/min (ref >60.00)
GLUCOSE: 79 mg/dL (ref 70–99)
Potassium: 4.3 mEq/L (ref 3.5–5.1)
Sodium: 137 mEq/L (ref 135–145)

## 2014-09-23 MED ORDER — FUROSEMIDE 20 MG PO TABS: 20.0000 mg | ORAL_TABLET | Freq: Every day | ORAL | Status: DC

## 2014-09-23 NOTE — Patient Instructions (Addendum)
Medication Instructions:   START TAKING LASIX 20 MG ONCE A DAY    Labwork:  BMET TODAY   Testing/Procedures:  NONE ORDER TODAY  Follow-Up:   A NURSE VISIT ON ONE WEEK WITH A BLOOD PRESSURE CHECK WITH SALLY PHARM D   Remote monitoring is used to monitor your Pacemaker of ICD from home. This monitoring reduces the number of office visits required to check your device to one time per year. It allows [name] to keep an eye on the functioning of your device to ensure it is working properly. You are scheduled for a device check from home on .12 / 15/16 You may send your transmission at any time that day. If you have a wireless device, the transmission will be sent automatically. After your physician reviews your transmission, you will receive a postcard with your next transmission date.  Your physician wants you to follow-up in: [place]  NP [place] will receive a reminder letter in the mail two months in advance. If you don't receive a letter, please call our office to schedule the follow-up appointment.   Any Other Special Instructions Will Be Listed Below (If Applicable).

## 2014-09-25 ENCOUNTER — Telehealth: Payer: Self-pay | Admitting: *Deleted

## 2014-09-30 ENCOUNTER — Ambulatory Visit (INDEPENDENT_AMBULATORY_CARE_PROVIDER_SITE_OTHER): Payer: Medicare Other | Admitting: Pharmacist

## 2014-09-30 VITALS — BP 138/82 | HR 60 | Wt 215.0 lb

## 2014-09-30 DIAGNOSIS — I10 Essential (primary) hypertension: Secondary | ICD-10-CM | POA: Diagnosis not present

## 2014-09-30 DIAGNOSIS — I25811 Atherosclerosis of native coronary artery of transplanted heart without angina pectoris: Secondary | ICD-10-CM | POA: Diagnosis not present

## 2014-09-30 NOTE — Progress Notes (Signed)
     HPI: Phillip Booth is a 64 y.o. male referred by Chanetta Marshall, NP to HTN clinic.  He was seen by her last week and asked to start furosemide 20mg  once daily due to swelling in his abdomen and SOB.  Pt did not pick up his prescription and start this.  He states his swelling has improved and only has occasional SOB when he is out in the hot weather.    Current HTN meds: Lisinopril 20mg  daily Carvedilol 6.25mg  twice daily Amlodipine 10mg  daily  BP goal: <140/80  Diet: He has plans to start a low sodium heart diet.  Exercise: Pt works on his feet during the day so has not been walking at much lately   Home BP readings: 9/16- 139/80 9/17- 137/84 9/18- 138/82 9/19- 138/79 9/20- 139/80  Wt Readings from Last 3 Encounters:  09/30/14 215 lb (97.523 kg)  09/23/14 212 lb (96.163 kg)  08/31/14 214 lb 9.6 oz (97.342 kg)   BP Readings from Last 3 Encounters:  09/30/14 138/82  09/23/14 114/80  08/31/14 130/80   Pulse Readings from Last 3 Encounters:  09/30/14 60  09/23/14 61  08/31/14 84    Renal function: Estimated Creatinine Clearance: 110.1 mL/min (by C-G formula based on Cr of 0.81).  Past Medical History  Diagnosis Date  . CVA (cerebral infarction)     a. diagnosis not clear  . Hypertension   . Hyperlipidemia   . Vertigo   . Symptomatic bradycardia     a. s/p STJ CRTP implanted by Dr Rayann Heman  . CAD (coronary artery disease)     a. moderate by cath 03/2014  . Non-ischemic cardiomyopathy     a. EF 45% by echo 03/2014  . Allergy   . Cataract   . Paroxysmal atrial fibrillation 05/01/2014    asymptomatic, documented on PPM interrogation,  chads2vasc score is at least 6.  He is contemplating anticoagulation    Current Outpatient Prescriptions on File Prior to Visit  Medication Sig Dispense Refill  . amLODipine (NORVASC) 10 MG tablet TAKE 1 TABLET (10 MG TOTAL) BY MOUTH DAILY. 30 tablet 2  . aspirin 81 MG tablet Take 162 mg by mouth daily.     . carvedilol (COREG)  6.25 MG tablet TAKE 1 TABLET (6.25 MG TOTAL) BY MOUTH 2 (TWO) TIMES DAILY WITH A MEAL. 60 tablet 2  . lisinopril (PRINIVIL,ZESTRIL) 20 MG tablet Take 1 tablet (20 mg total) by mouth daily. 90 tablet 0  . tamsulosin (FLOMAX) 0.4 MG CAPS capsule TAKE 1 CAPSULE (0.4 MG TOTAL) BY MOUTH DAILY. 30 capsule 2  . furosemide (LASIX) 20 MG tablet Take 1 tablet (20 mg total) by mouth daily. (Patient not taking: Reported on 09/30/2014) 90 tablet 3  . sildenafil (VIAGRA) 25 MG tablet Take 1 tablet (25 mg total) by mouth daily as needed for erectile dysfunction (1 tab po as needed prior to sex. max 1 tab po q day). 10 tablet 0   No current facility-administered medications on file prior to visit.    No Known Allergies   Assessment/Plan:  1.  Hypertension- Pt's BP stable.  Fluid level seems fairly well controlled at the moment.  Since he has not started furosemide yet, told him to take it as needed for swelling and SOB.  If he has to take it more than 3 days a week, will need to recheck his BMET.

## 2014-10-05 LAB — CUP PACEART INCLINIC DEVICE CHECK
Date Time Interrogation Session: 20160926073317
Pulse Gen Model: 3242
Pulse Gen Serial Number: 7724805

## 2014-10-15 ENCOUNTER — Other Ambulatory Visit: Payer: Self-pay

## 2014-10-15 MED ORDER — TAMSULOSIN HCL 0.4 MG PO CAPS: ORAL_CAPSULE | ORAL | Status: DC

## 2014-10-15 NOTE — Telephone Encounter (Signed)
Pt would like 90 day supply with one refill.  Pt last OV 08/31/14.  Please advise on refill.

## 2014-10-16 ENCOUNTER — Telehealth: Payer: Self-pay | Admitting: *Deleted

## 2014-10-16 MED ORDER — AMLODIPINE BESYLATE 10 MG PO TABS: ORAL_TABLET | ORAL | Status: DC

## 2014-10-16 MED ORDER — CARVEDILOL 6.25 MG PO TABS: ORAL_TABLET | ORAL | Status: DC

## 2014-10-16 NOTE — Telephone Encounter (Signed)
Received fax from [facility] requesting 90 day supply of amlodipine and carvedilol.  90 day supply sent.

## 2014-10-19 ENCOUNTER — Encounter: Payer: Self-pay | Admitting: Internal Medicine

## 2014-10-26 ENCOUNTER — Ambulatory Visit (INDEPENDENT_AMBULATORY_CARE_PROVIDER_SITE_OTHER): Payer: Medicare Other

## 2014-10-26 DIAGNOSIS — Z95810 Presence of automatic (implantable) cardiac defibrillator: Secondary | ICD-10-CM | POA: Diagnosis not present

## 2014-10-26 DIAGNOSIS — I428 Other cardiomyopathies: Secondary | ICD-10-CM

## 2014-10-26 DIAGNOSIS — I429 Cardiomyopathy, unspecified: Secondary | ICD-10-CM

## 2014-10-26 NOTE — Progress Notes (Signed)
EPIC Encounter for ICM Monitoring  Patient Name: Phillip Booth is a 64 y.o. male Date: 10/26/2014 Primary Care Physican: [name], PA-C Primary Cardiologist: Claiborne Booth Electrophysiologist: Allred Dry Weight: 214 lb       Bi-V Pacing >99%  In the past month, have you:  1. Gained more than 2 pounds in a day or more than 5 pounds in a week? no  2. Had changes in your medications (with verification of current medications)? no  3. Had more shortness of breath than is usual for you? no  4. Limited your activity because of shortness of breath? no  5. Not been able to sleep because of shortness of breath? no  6. Had increased swelling in your feet or ankles? no  7. Had symptoms of dehydration (dizziness, dry mouth, increased thirst, decreased urine output) no  8. Had changes in sodium restriction? no  9. Been compliant with medication? Yes   ICM trend:  Follow-up plan: ICM clinic phone appointment on 11/26/2014.  Corvue impedance trending along baseline.  Patient reported he is feeling well.  He will be returning to work.  No changes today.   Copy of note sent to patient's primary care physician, primary cardiologist, and device following physician.  Phillip Billings, RN, CCM 10/26/2014 4:29 PM

## 2014-10-31 ENCOUNTER — Other Ambulatory Visit: Payer: Self-pay | Admitting: Medical

## 2014-11-09 ENCOUNTER — Ambulatory Visit (INDEPENDENT_AMBULATORY_CARE_PROVIDER_SITE_OTHER): Payer: Medicare Other | Admitting: Medical

## 2014-11-09 ENCOUNTER — Encounter: Payer: Self-pay | Admitting: Medical

## 2014-11-09 VITALS — BP 139/88 | HR 61 | Temp 97.8°F | Ht 76.0 in | Wt 218.0 lb

## 2014-11-09 DIAGNOSIS — N400 Enlarged prostate without lower urinary tract symptoms: Secondary | ICD-10-CM

## 2014-11-09 DIAGNOSIS — I10 Essential (primary) hypertension: Secondary | ICD-10-CM | POA: Diagnosis not present

## 2014-11-09 DIAGNOSIS — Z23 Encounter for immunization: Secondary | ICD-10-CM | POA: Diagnosis not present

## 2014-11-09 DIAGNOSIS — I25811 Atherosclerosis of native coronary artery of transplanted heart without angina pectoris: Secondary | ICD-10-CM | POA: Diagnosis not present

## 2014-11-09 DIAGNOSIS — N528 Other male erectile dysfunction: Secondary | ICD-10-CM | POA: Diagnosis not present

## 2014-11-09 DIAGNOSIS — J3089 Other allergic rhinitis: Secondary | ICD-10-CM

## 2014-11-09 DIAGNOSIS — K219 Gastro-esophageal reflux disease without esophagitis: Secondary | ICD-10-CM | POA: Diagnosis not present

## 2014-11-09 DIAGNOSIS — E785 Hyperlipidemia, unspecified: Secondary | ICD-10-CM

## 2014-11-09 DIAGNOSIS — R062 Wheezing: Secondary | ICD-10-CM

## 2014-11-09 MED ORDER — ALBUTEROL SULFATE HFA 108 (90 BASE) MCG/ACT IN AERS: 2.0000 | INHALATION_SPRAY | Freq: Four times a day (QID) | RESPIRATORY_TRACT | Status: DC | PRN

## 2014-11-09 MED ORDER — FLUTICASONE PROPIONATE 50 MCG/ACT NA SUSP: 2.0000 | Freq: Every day | NASAL | Status: DC

## 2014-11-09 MED ORDER — AMLODIPINE BESYLATE 10 MG PO TABS: ORAL_TABLET | ORAL | Status: DC

## 2014-11-09 MED ORDER — OMEPRAZOLE 20 MG PO CPDR: 20.0000 mg | DELAYED_RELEASE_CAPSULE | Freq: Every day | ORAL | Status: DC

## 2014-11-09 NOTE — Progress Notes (Signed)
Subjective:    Patient ID: Phillip Booth, male    DOB: 1950-03-21, 64 y.o.   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 Pt is actually ok urinating  with flomax recently. He only had one day when he had extreme urge to urinate while driving but then could not go readily. Pt states will see urologist next week.   Pt also has a lot of questions regarding erectile dysfunction. He will discuss possibility of penile implant with urologist next week.   Pt mentions occasional belching that is random. He states he will sometimes confuse this with some chest pain. But he accounts each case will have obvious moderate belch/ burp and symptom will resolve.  Pt blood pressure is good today. No cardiac or neurologic signs or symptoms.  Some mild wheezing recently/today. States some cough last night. Pt states some sneezing at work. 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 Years ago he had wheezing and had to use inhaler. Pt is using some afrin.   Review of Systems  Constitutional: Negative for fever, chills, diaphoresis, activity change and fatigue.  Respiratory: Negative for cough, chest tightness and shortness of breath.   Cardiovascular: Negative for chest pain, palpitations and leg swelling.  Gastrointestinal: Positive for abdominal pain. Negative for nausea and vomiting.       With  Belching. None now.  Genitourinary:       Erectile dysfunction  Musculoskeletal: Negative for back pain, neck pain and neck stiffness.  Neurological: Negative for dizziness, tremors, seizures, syncope, facial asymmetry, speech difficulty, weakness, light-headedness, numbness and headaches.  Hematological: Negative for adenopathy. Does not bruise/bleed easily.  Psychiatric/Behavioral: Negative for behavioral problems, confusion and agitation. The patient is not nervous/anxious.     Past Medical History  Diagnosis Date  . CVA (cerebral infarction)     a.  diagnosis not clear  . Hypertension   . Hyperlipidemia   . Vertigo   . Symptomatic bradycardia     a. s/p STJ CRTP implanted by Dr Rayann Heman  . CAD (coronary artery disease)     a. moderate by cath 03/2014  . Non-ischemic cardiomyopathy ([place])     a. EF 45% by echo 03/2014  . Allergy   . Cataract   . Paroxysmal atrial fibrillation ([place]) 05/01/2014    asymptomatic, documented on PPM interrogation,  chads2vasc score is at least 6.  He is contemplating anticoagulation    Social History   Social History  . Marital Status: Married    Spouse Name: N/A  . Number of Children: N/A  . Years of Education: N/A   Occupational History  . Not on file.   Social History Main Topics  . Smoking status: Never Smoker   . Smokeless tobacco: Never Used  . Alcohol Use: Yes     Comment: occasional  . Drug Use: No  . Sexual Activity: Yes   Other Topics Concern  . Not on file   Social History Narrative    Past Surgical History  Procedure Laterality Date  . Abdominal surgery    . Hernia repair    . Appendectomy    . Left heart catheterization with coronary angiogram N/A 03/19/2014    Procedure: LEFT HEART CATHETERIZATION WITH CORONARY ANGIOGRAM;  Surgeon: [name], MD;  Location: [facility] CATH LAB;  Service: Cardiovascular;  Laterality: N/A;  . Bi-ventricular pacemaker insertion N/A 03/19/2014    STJ CRTP implanted by Dr Rayann Heman  Family History  Problem Relation Age of Onset  . Hypertension Mother   . Cancer Mother     brain (possibly started in lung)  . Heart disease Mother   . Diabetes Mother   . Other Mother     cardiomegaly  . Hypertension Father   . Emphysema Father   . Heart Problems Brother     pacemaker  . Heart Problems Other     "using 10% of heart"    No Known Allergies  Current Outpatient Prescriptions on File Prior to Visit  Medication Sig Dispense Refill  . aspirin 81 MG tablet Take 162 mg by mouth daily.     . carvedilol (COREG) 6.25 MG tablet TAKE 1 TABLET (6.25  MG TOTAL) BY MOUTH 2 (TWO) TIMES DAILY WITH A MEAL. 90 tablet 0  . furosemide (LASIX) 20 MG tablet Take 1 tablet (20 mg total) by mouth daily. 90 tablet 3  . lisinopril (PRINIVIL,ZESTRIL) 20 MG tablet Take 1 tablet (20 mg total) by mouth daily. 90 tablet 0  . sildenafil (VIAGRA) 25 MG tablet Take 1 tablet (25 mg total) by mouth daily as needed for erectile dysfunction (1 tab po as needed prior to sex. max 1 tab po q day). 10 tablet 0  . tamsulosin (FLOMAX) 0.4 MG CAPS capsule TAKE 1 CAPSULE (0.4 MG TOTAL) BY MOUTH DAILY. 90 capsule 1  . amLODipine (NORVASC) 10 MG tablet TAKE 1 TABLET (10 MG TOTAL) BY MOUTH DAILY. (Patient not taking: Reported on 11/09/2014) 90 tablet 1   No current facility-administered medications on file prior to visit.    BP 128/84 mmHg  Pulse 61  Temp(Src) 97.8 F (36.6 C) (Oral)  Ht 6\' 4"  (1.93 m)  Wt 218 lb (98.884 kg)  BMI 26.55 kg/m2  SpO2 98%       Objective:   Physical Exam   General Mental Status- Alert. General Appearance- Not in acute distress.   Skin General: Color- Normal Color. Moisture- Normal Moisture.  Neck Carotid Arteries- Normal color. Moisture- Normal Moisture. No carotid bruits. No JVD.  Chest and Lung Exam Auscultation: Breath Sounds:-Normal. CTA.  Cardiovascular Auscultation:Rythm- Regular, Rate, Rythm Murmurs & Other Heart Sounds:Auscultation of the heart reveals- No Murmurs.  Abdomen Inspection:-Inspeection Normal. Palpation/Percussion:Note:No mass. Palpation and Percussion of the abdomen reveal- Non Tender, Non Distended + BS, no rebound or guarding.   Neurologic Cranial Nerve exam:- CN III-XII intact(No nystagmus), symmetric smile. Strength:- 5/5 equal and symmetric strength both upper and lower extremities.     Assessment & Plan:  BP is controlled. Continue current med regimen.  For bph. Continue flomax. Urologst may add other medication.  For your belching, will rx omeprazole. Strongly recommend using daily to  avoid any confusion regarding gerd type pain and potential chest pain. If not sure ED evaluation needed at such time.  For your wheezing, I will rx the albuterol inhaler.  For possible allergies as well will rx flonase.  Please get fasting cmp and lipid panel tomorrow.  Follow up in 3 months or as needed.

## 2014-11-09 NOTE — Patient Instructions (Addendum)
BP is controlled. Continue current med regimen.  For bph. Continue flomax. Urologst may add other medication.  For your belching, will rx omeprazole. Strongly recommend using daily to avoid any confusion regarding gerd type pain and potential chest pain. If not sure ED evaluation needed at such time.  For your wheezing, I will rx the albuterol inhaler.  For possible allergies as well will rx flonase.  Please get fasting cmp and lipid panel tomorrow.  Follow up in 3 months or as needed.

## 2014-11-09 NOTE — Progress Notes (Signed)
Pre visit review using our clinic review tool, if applicable. No additional management support is needed unless otherwise documented below in the visit note. 

## 2014-11-10 ENCOUNTER — Other Ambulatory Visit (INDEPENDENT_AMBULATORY_CARE_PROVIDER_SITE_OTHER): Payer: Medicare Other

## 2014-11-10 DIAGNOSIS — I10 Essential (primary) hypertension: Secondary | ICD-10-CM

## 2014-11-10 DIAGNOSIS — E785 Hyperlipidemia, unspecified: Secondary | ICD-10-CM | POA: Diagnosis not present

## 2014-11-10 LAB — COMPREHENSIVE METABOLIC PANEL
ALBUMIN: 3.5 g/dL (ref 3.5–5.2)
ALK PHOS: 58 U/L (ref 39–117)
ALT: 18 U/L (ref 0–53)
AST: 20 U/L (ref 0–37)
BILIRUBIN TOTAL: 1.2 mg/dL (ref 0.2–1.2)
BUN: 11 mg/dL (ref 6–23)
CALCIUM: 9.3 mg/dL (ref 8.4–10.5)
CO2: 27 mEq/L (ref 19–32)
CREATININE: 0.92 mg/dL (ref 0.40–1.50)
Chloride: 106 mEq/L (ref 96–112)
GFR: 105.76 mL/min (ref >60.00)
Glucose, Bld: 89 mg/dL (ref 70–99)
Potassium: 3.8 mEq/L (ref 3.5–5.1)
SODIUM: 138 meq/L (ref 135–145)
TOTAL PROTEIN: 7.3 g/dL (ref 6.0–8.3)

## 2014-11-10 LAB — LIPID PANEL
CHOLESTEROL: 219 mg/dL — AB (ref 0–200)
HDL: 50.6 mg/dL (ref >39.00)
LDL Cholesterol: 148 mg/dL — ABNORMAL HIGH (ref 0–99)
NonHDL: 168.09
TRIGLYCERIDES: 99 mg/dL (ref 0.0–149.0)
Total CHOL/HDL Ratio: 4
VLDL: 19.8 mg/dL (ref 0.0–40.0)

## 2014-11-13 MED ORDER — ROSUVASTATIN CALCIUM 10 MG PO TABS: 10.0000 mg | ORAL_TABLET | Freq: Every day | ORAL | Status: DC

## 2014-11-13 NOTE — Addendum Note (Signed)
Addended by: Tasia Catchings on: 11/13/2014 01:11 PM   Modules accepted: Orders

## 2014-11-25 ENCOUNTER — Ambulatory Visit: Payer: Medicare Other | Admitting: Medical

## 2014-11-25 DIAGNOSIS — H25033 Anterior subcapsular polar age-related cataract, bilateral: Secondary | ICD-10-CM | POA: Diagnosis not present

## 2014-11-26 ENCOUNTER — Ambulatory Visit (INDEPENDENT_AMBULATORY_CARE_PROVIDER_SITE_OTHER): Payer: Medicare Other

## 2014-11-26 ENCOUNTER — Telehealth: Payer: Self-pay

## 2014-11-26 DIAGNOSIS — I429 Cardiomyopathy, unspecified: Secondary | ICD-10-CM | POA: Diagnosis not present

## 2014-11-26 DIAGNOSIS — I428 Other cardiomyopathies: Secondary | ICD-10-CM

## 2014-11-26 DIAGNOSIS — Z950 Presence of cardiac pacemaker: Secondary | ICD-10-CM | POA: Diagnosis not present

## 2014-11-26 NOTE — Telephone Encounter (Signed)
ICM transmission received.  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  He stated to call back after 2pm tomorrow.

## 2014-11-27 NOTE — Progress Notes (Signed)
EPIC Encounter for ICM Monitoring  Patient Name: Phillip Booth is a 64 y.o. male Date: 11/27/2014 Primary Care Physican: [name], PA-C Primary Cardiologist: Claiborne Billings Electrophysiologist: Allred Dry Weight: 214 lbs       In the past month, have you:  1. Gained more than 2 pounds in a day or more than 5 pounds in a week? no  2. Had changes in your medications (with verification of current medications)? no  3. Had more shortness of breath than is usual for you? no  4. Limited your activity because of shortness of breath? no  5. Not been able to sleep because of shortness of breath? no  6. Had increased swelling in your feet or ankles? no  7. Had symptoms of dehydration (dizziness, dry mouth, increased thirst, decreased urine output) no  8. Had changes in sodium restriction? no  9. Been compliant with medication? Yes   ICM trend:  11/26/2014    Follow-up plan: ICM clinic phone appointment 12/28/2014.  Corvue daily impedance trending along baseline.  He denied any problems at this time.   Device clinic appointment was scheduled for 12/02/2014 due patient had requested to talk with the device clinic regarding monitor.  He stated he does not need the appointment and can be canceled.  No changes today.    Copy of note sent to patient's primary care physician, primary cardiologist, and device following physician.  Rosalene Billings, RN, CCM 11/27/2014 2:57 PM

## 2014-11-27 NOTE — Telephone Encounter (Signed)
Attempted ICM call to patient.  Left message for return call.

## 2014-11-27 NOTE — Telephone Encounter (Signed)
Spoke to patient

## 2014-12-02 ENCOUNTER — Encounter: Payer: Medicare Other | Admitting: Medical

## 2014-12-02 ENCOUNTER — Telehealth: Payer: Self-pay | Admitting: Medical

## 2014-12-02 DIAGNOSIS — Z0289 Encounter for other administrative examinations: Secondary | ICD-10-CM

## 2014-12-02 NOTE — Progress Notes (Signed)
This encounter was created in error - please disregard.

## 2014-12-10 NOTE — Telephone Encounter (Signed)
Pt was no show 12/02/14 2:00pm for follow up appt, pt did not reschedule, left msg for pt to call and reschedule & notified of cancellation and no show policy, I think 2nd no show, charge or no charge?

## 2014-12-10 NOTE — Telephone Encounter (Signed)
Charge per ES Verbal Order.

## 2014-12-17 ENCOUNTER — Encounter: Payer: Self-pay | Admitting: Medical

## 2014-12-17 ENCOUNTER — Ambulatory Visit (INDEPENDENT_AMBULATORY_CARE_PROVIDER_SITE_OTHER): Payer: Medicare Other | Admitting: Medical

## 2014-12-17 ENCOUNTER — Ambulatory Visit (HOSPITAL_BASED_OUTPATIENT_CLINIC_OR_DEPARTMENT_OTHER)
Admission: RE | Admit: 2014-12-17 | Discharge: 2014-12-17 | Disposition: A | Discharge status: Home / Self Care | Payer: Medicare Other | Source: Ambulatory Visit | Attending: Medical | Admitting: Medical

## 2014-12-17 VITALS — BP 124/84 | HR 83 | Temp 98.1°F | Ht 76.0 in | Wt 215.6 lb

## 2014-12-17 DIAGNOSIS — R0602 Shortness of breath: Secondary | ICD-10-CM | POA: Diagnosis not present

## 2014-12-17 DIAGNOSIS — K219 Gastro-esophageal reflux disease without esophagitis: Secondary | ICD-10-CM

## 2014-12-17 DIAGNOSIS — I10 Essential (primary) hypertension: Secondary | ICD-10-CM

## 2014-12-17 DIAGNOSIS — Z8673 Personal history of transient ischemic attack (TIA), and cerebral infarction without residual deficits: Secondary | ICD-10-CM | POA: Insufficient documentation

## 2014-12-17 DIAGNOSIS — R0600 Dyspnea, unspecified: Secondary | ICD-10-CM

## 2014-12-17 DIAGNOSIS — I252 Old myocardial infarction: Secondary | ICD-10-CM | POA: Diagnosis not present

## 2014-12-17 DIAGNOSIS — I2510 Atherosclerotic heart disease of native coronary artery without angina pectoris: Secondary | ICD-10-CM | POA: Diagnosis not present

## 2014-12-17 DIAGNOSIS — I25811 Atherosclerosis of native coronary artery of transplanted heart without angina pectoris: Secondary | ICD-10-CM

## 2014-12-17 DIAGNOSIS — I4891 Unspecified atrial fibrillation: Secondary | ICD-10-CM | POA: Diagnosis not present

## 2014-12-17 LAB — COMPREHENSIVE METABOLIC PANEL
ALBUMIN: 3.6 g/dL (ref 3.5–5.2)
ALT: 18 U/L (ref 0–53)
AST: 21 U/L (ref 0–37)
Alkaline Phosphatase: 56 U/L (ref 39–117)
BUN: 13 mg/dL (ref 6–23)
CHLORIDE: 105 meq/L (ref 96–112)
CO2: 28 meq/L (ref 19–32)
Calcium: 9.2 mg/dL (ref 8.4–10.5)
Creatinine, Ser: 0.87 mg/dL (ref 0.40–1.50)
GFR: 112.77 mL/min (ref >60.00)
GLUCOSE: 85 mg/dL (ref 70–99)
POTASSIUM: 4.2 meq/L (ref 3.5–5.1)
SODIUM: 138 meq/L (ref 135–145)
Total Bilirubin: 1.1 mg/dL (ref 0.2–1.2)
Total Protein: 7.1 g/dL (ref 6.0–8.3)

## 2014-12-17 LAB — TROPONIN I: TNIDX: 0.03 ug/l (ref 0.00–0.06)

## 2014-12-17 LAB — BRAIN NATRIURETIC PEPTIDE: PRO B NATRI PEPTIDE: 191 pg/mL — AB (ref 0.0–100.0)

## 2014-12-17 NOTE — Patient Instructions (Addendum)
For htn continue your current bp meds.  For the rare dyspnea with climbing steps and exerting will get cxr, and bnp. Continue with diuretic tabs as cardiologist recommended  You have hx of MI in past. No chest pain currently. Decided with above lab will get troponin. Low suspicion for cardiac condition presently but by hx decided to do labs to eval symptoms other day.  Continue the omeprazole for your Jerrye Bushy.  I want you to take it easy and not exercise until you get opinion form cardiologist.  Follow up date to be determined after lab work back.

## 2014-12-17 NOTE — Progress Notes (Signed)
Pre visit review using our clinic review tool, if applicable. No additional management support is needed unless otherwise documented below in the visit note. 

## 2014-12-17 NOTE — Progress Notes (Signed)
Subjective:    Patient ID: Phillip Booth, male    DOB: [birth year], 64 y.o.   MRN: [id number]  HPI  BP is controlled. Normal cardio or neurologic signs or symptoms.  Pt states he feels slight decreased stamina. He feels little winded going up steps(last felt 3-4 days ago(stained sensation in chest one minute then went away). Pt had echo and EF was 45%. Pt has hx of paroxysmal atrial fibrillation. Pt last saw cardiologist in 09-2014. He was told to take lasix for prior swelling and sob. Pt can't remember if this makes any impact.   Pt has a pacemaker. Pt has told cardiologist these symptoms in past.   Pt has no cardiac symptoms now. No chest pain. Pt O2 sat is  98%.   Pt also update me that his epigastric lower chest sensation discomfort after eating is relieved with the prilosec. He is happy with this med.Taking med daily.    Review of Systems  Constitutional: Negative for chills and fatigue.  Respiratory: Positive for shortness of breath. Negative for cough, chest tightness and wheezing.        See hpi. Not presently.  Cardiovascular: Negative for chest pain and palpitations.  Gastrointestinal: Negative for abdominal pain.  Musculoskeletal: Negative for myalgias and back pain.       No pedal edema recently.  Neurological: Negative for dizziness, weakness and headaches.  Hematological: Negative for adenopathy. Does not bruise/bleed easily.  Psychiatric/Behavioral: Negative for behavioral problems, confusion and agitation.    Past Medical History  Diagnosis Date  . CVA (cerebral infarction)     a. diagnosis not clear  . Hypertension   . Hyperlipidemia   . Vertigo   . Symptomatic bradycardia     a. s/p STJ CRTP implanted by Dr Rayann Heman  . CAD (coronary artery disease)     a. moderate by cath 03/2014  . Non-ischemic cardiomyopathy ([place])     a. EF 45% by echo 03/2014  . Allergy   . Cataract   . Paroxysmal atrial fibrillation ([place]) 05/01/2014    asymptomatic, documented on PPM  interrogation,  chads2vasc score is at least 6.  He is contemplating anticoagulation    Social History   Social History  . Marital Status: Married    Spouse Name: N/A  . Number of Children: N/A  . Years of Education: N/A   Occupational History  . Not on file.   Social History Main Topics  . Smoking status: Never Smoker   . Smokeless tobacco: Never Used  . Alcohol Use: Yes     Comment: occasional  . Drug Use: No  . Sexual Activity: Yes   Other Topics Concern  . Not on file   Social History Narrative    Past Surgical History  Procedure Laterality Date  . Abdominal surgery    . Hernia repair    . Appendectomy    . Left heart catheterization with coronary angiogram N/A 03/19/2014    Procedure: LEFT HEART CATHETERIZATION WITH CORONARY ANGIOGRAM;  Surgeon: [name], MD;  Location: [facility] CATH LAB;  Service: Cardiovascular;  Laterality: N/A;  . Bi-ventricular pacemaker insertion N/A 03/19/2014    STJ CRTP implanted by Dr Rayann Heman    Family History  Problem Relation Age of Onset  . Hypertension Mother   . Cancer Mother     brain (possibly started in lung)  . Heart disease Mother   . Diabetes Mother   . Other Mother     cardiomegaly  . Hypertension Father   .  Emphysema Father   . Heart Problems Brother     pacemaker  . Heart Problems Other     "using 10% of heart"    No Known Allergies  Current Outpatient Prescriptions on File Prior to Visit  Medication Sig Dispense Refill  . albuterol (PROVENTIL HFA;VENTOLIN HFA) 108 (90 BASE) MCG/ACT inhaler Inhale 2 puffs into the lungs every 6 (six) hours as needed for wheezing or shortness of breath. 1 Inhaler 0  . amLODipine (NORVASC) 10 MG tablet TAKE 1 TABLET (10 MG TOTAL) BY MOUTH DAILY. 90 tablet 1  . aspirin 81 MG tablet Take 162 mg by mouth daily.     . carvedilol (COREG) 6.25 MG tablet TAKE 1 TABLET (6.25 MG TOTAL) BY MOUTH 2 (TWO) TIMES DAILY WITH A MEAL. 90 tablet 0  . fluticasone (FLONASE) 50 MCG/ACT nasal spray  Place 2 sprays into both nostrils daily. 16 g 1  . furosemide (LASIX) 20 MG tablet Take 1 tablet (20 mg total) by mouth daily. 90 tablet 3  . lisinopril (PRINIVIL,ZESTRIL) 20 MG tablet Take 1 tablet (20 mg total) by mouth daily. 90 tablet 0  . omeprazole (PRILOSEC) 20 MG capsule Take 1 capsule (20 mg total) by mouth daily. 30 capsule 3  . rosuvastatin (CRESTOR) 10 MG tablet Take 1 tablet (10 mg total) by mouth daily. 30 tablet 3  . sildenafil (VIAGRA) 25 MG tablet Take 1 tablet (25 mg total) by mouth daily as needed for erectile dysfunction (1 tab po as needed prior to sex. max 1 tab po q day). 10 tablet 0  . tamsulosin (FLOMAX) 0.4 MG CAPS capsule TAKE 1 CAPSULE (0.4 MG TOTAL) BY MOUTH DAILY. 90 capsule 1   No current facility-administered medications on file prior to visit.    BP 124/84 mmHg  Pulse 83  Temp(Src) 98.1 F (36.7 C) (Oral)  Ht 6\' 4"  (1.93 m)  Wt 215 lb 9.6 oz (97.796 kg)  BMI 26.25 kg/m2  SpO2 99%      Objective:   Physical Exam  General Mental Status- Alert. General Appearance- Not in acute distress.   Skin General: Color- Normal Color. Moisture- Normal Moisture.  Neck Carotid Arteries- Normal color. Moisture- Normal Moisture. No carotid bruits. No JVD.  Chest and Lung Exam Auscultation: Breath Sounds:-Normal.  Cardiovascular Auscultation:Rythm- Regular. Murmurs & Other Heart Sounds:Auscultation of the heart reveals- No Murmurs.  Abdomen Inspection:-Inspeection Normal. Palpation/Percussion:Note:No mass. Palpation and Percussion of the abdomen reveal- Non Tender, Non Distended + BS, no rebound or guarding.    Neurologic Cranial Nerve exam:- CN III-XII intact(No nystagmus), symmetric smile. Strength:- 5/5 equal and symmetric strength both upper and lower extremities.  Lower ext- no pedal edema. Negative homans sign      Assessment & Plan:  For htn continue your current bp meds.  For the rare dyspnea with climbing steps and exerting will get  cxr, and bnp. Continue with diuretic tabs as cardiologist recommended  You have hx of MI in past. No chest pain currently. Decided with above lab will get troponin. Low suspicion for cardiac condition presently but by hx decided to do labs to eval symptoms other day.  Continue the omeprazole for your Jerrye Bushy.  I want you to take it easy and not exercise until you get opinion form cardiologist.  Follow up date to be determined after lab work back.

## 2014-12-25 ENCOUNTER — Encounter (HOSPITAL_BASED_OUTPATIENT_CLINIC_OR_DEPARTMENT_OTHER): Payer: Self-pay | Admitting: *Deleted

## 2014-12-25 ENCOUNTER — Emergency Department (HOSPITAL_BASED_OUTPATIENT_CLINIC_OR_DEPARTMENT_OTHER)
Admission: EM | Admit: 2014-12-25 | Discharge: 2014-12-25 | Disposition: A | Discharge status: Home / Self Care | Payer: Medicare Other | Attending: Emergency Medicine | Admitting: Emergency Medicine

## 2014-12-25 DIAGNOSIS — Z8669 Personal history of other diseases of the nervous system and sense organs: Secondary | ICD-10-CM | POA: Insufficient documentation

## 2014-12-25 DIAGNOSIS — I480 Paroxysmal atrial fibrillation: Secondary | ICD-10-CM | POA: Diagnosis not present

## 2014-12-25 DIAGNOSIS — Z9889 Other specified postprocedural states: Secondary | ICD-10-CM | POA: Insufficient documentation

## 2014-12-25 DIAGNOSIS — Z79899 Other long term (current) drug therapy: Secondary | ICD-10-CM | POA: Diagnosis not present

## 2014-12-25 DIAGNOSIS — Z7951 Long term (current) use of inhaled steroids: Secondary | ICD-10-CM | POA: Insufficient documentation

## 2014-12-25 DIAGNOSIS — K0381 Cracked tooth: Secondary | ICD-10-CM | POA: Diagnosis not present

## 2014-12-25 DIAGNOSIS — I2510 Atherosclerotic heart disease of native coronary artery without angina pectoris: Secondary | ICD-10-CM | POA: Insufficient documentation

## 2014-12-25 DIAGNOSIS — K047 Periapical abscess without sinus: Secondary | ICD-10-CM | POA: Insufficient documentation

## 2014-12-25 DIAGNOSIS — Z7982 Long term (current) use of aspirin: Secondary | ICD-10-CM | POA: Insufficient documentation

## 2014-12-25 DIAGNOSIS — K002 Abnormalities of size and form of teeth: Secondary | ICD-10-CM | POA: Diagnosis not present

## 2014-12-25 DIAGNOSIS — E785 Hyperlipidemia, unspecified: Secondary | ICD-10-CM | POA: Insufficient documentation

## 2014-12-25 DIAGNOSIS — Z8673 Personal history of transient ischemic attack (TIA), and cerebral infarction without residual deficits: Secondary | ICD-10-CM | POA: Insufficient documentation

## 2014-12-25 DIAGNOSIS — K0889 Other specified disorders of teeth and supporting structures: Secondary | ICD-10-CM | POA: Diagnosis present

## 2014-12-25 DIAGNOSIS — I10 Essential (primary) hypertension: Secondary | ICD-10-CM | POA: Diagnosis not present

## 2014-12-25 MED ORDER — PENICILLIN V POTASSIUM 500 MG PO TABS: 500.0000 mg | ORAL_TABLET | Freq: Four times a day (QID) | ORAL | Status: DC

## 2014-12-25 MED ORDER — NAPROXEN 500 MG PO TABS: 500.0000 mg | ORAL_TABLET | Freq: Two times a day (BID) | ORAL | Status: DC

## 2014-12-25 NOTE — Discharge Instructions (Signed)
Take the prescribed medication as directed. Follow-up with your dentist as soon as possible. If you feel you need to take cold medication, recommend coricidin as this will not elevated your blood pressure. Return to the ED for new or worsening symptoms.  Dental Abscess A dental abscess is a collection of pus in or around a tooth. CAUSES This condition is caused by a bacterial infection around the root of the tooth that involves the inner part of the tooth (pulp). It may result from:  Severe tooth decay.  Trauma to the tooth that allows bacteria to enter into the pulp, such as a broken or chipped tooth.  Severe gum disease around a tooth. SYMPTOMS Symptoms of this condition include:  Severe pain in and around the infected tooth.  Swelling and redness around the infected tooth, in the mouth, or in the face.  Tenderness.  Pus drainage.  Bad breath.  Bitter taste in the mouth.  Difficulty swallowing.  Difficulty opening the mouth.  Nausea.  Vomiting.  Chills.  Swollen neck glands.  Fever. DIAGNOSIS This condition is diagnosed with examination of the infected tooth. During the exam, your dentist may tap on the infected tooth. Your dentist will also ask about your medical and dental history and may order X-rays. TREATMENT This condition is treated by eliminating the infection. This may be done with:  Antibiotic medicine.  A root canal. This may be performed to save the tooth.  Pulling (extracting) the tooth. This may also involve draining the abscess. This is done if the tooth cannot be saved. HOME CARE INSTRUCTIONS  Take medicines only as directed by your dentist.  If you were prescribed antibiotic medicine, finish all of it even if you start to feel better.  Rinse your mouth (gargle) often with salt water to relieve pain or swelling.  Do not drive or operate heavy machinery while taking pain medicine.  Do not apply heat to the outside of your mouth.  Keep  all follow-up visits as directed by your dentist. This is important. SEEK MEDICAL CARE IF:  Your pain is worse and is not helped by medicine. SEEK IMMEDIATE MEDICAL CARE IF:  You have a fever or chills.  Your symptoms suddenly get worse.  You have a very bad headache.  You have problems breathing or swallowing.  You have trouble opening your mouth.  You have swelling in your neck or around your eye.   This information is not intended to replace advice given to you by your health care provider. Make sure you discuss any questions you have with your health care provider.   Document Released: 12/26/2004 Document Revised: 05/12/2014 Document Reviewed: 12/23/2013 Elsevier Interactive Patient Education [employer].

## 2014-12-25 NOTE — ED Provider Notes (Signed)
CSN: [id number]     Arrival date & time 12/25/14  1455 History   First MD Initiated Contact with Patient 12/25/14 1523     Chief Complaint  Patient presents with  . Facial Pain     (Consider location/radiation/quality/duration/timing/severity/associated sxs/prior Treatment) The history is provided by the patient and medical records.    This is a 64 year old male with history of hypertension, hyperlipidemia, coronary artery disease, paroxysmal A. fib, CVA, vertigo, presenting to the ED for right facial pain. Patient states this began a few days ago. He states he initially thought he was having some sinus congestion, and as time progressed pain was more localized to his right upper teeth. Patient has multiple broken teeth with cavities, he is due for a large amount of dental work in January. States pain is throbbing in nature and is causing somewhat of a right-sided headache. He denies any fever, chills, dizziness, or confusion. Patient did take some Aleve this morning. He states he also took some over-the-counter cold medication which he states he knows he is not supposed to do as it drops of his blood pressure. Of note, patient is hypertensive on arrival. He also has not taken his blood pressure medications today.  Patient did attempt to go to his dentist this morning, however they were closed.    Past Medical History  Diagnosis Date  . CVA (cerebral infarction)     a. diagnosis not clear  . Hypertension   . Hyperlipidemia   . Vertigo   . Symptomatic bradycardia     a. s/p STJ CRTP implanted by Dr Rayann Heman  . CAD (coronary artery disease)     a. moderate by cath 03/2014  . Non-ischemic cardiomyopathy ([place])     a. EF 45% by echo 03/2014  . Allergy   . Cataract   . Paroxysmal atrial fibrillation ([place]) 05/01/2014    asymptomatic, documented on PPM interrogation,  chads2vasc score is at least 6.  He is contemplating anticoagulation   Past Surgical History  Procedure Laterality Date  .  Abdominal surgery    . Hernia repair    . Appendectomy    . Left heart catheterization with coronary angiogram N/A 03/19/2014    Procedure: LEFT HEART CATHETERIZATION WITH CORONARY ANGIOGRAM;  Surgeon: [name], MD;  Location: [facility] CATH LAB;  Service: Cardiovascular;  Laterality: N/A;  . Bi-ventricular pacemaker insertion N/A 03/19/2014    STJ CRTP implanted by Dr Rayann Heman   Family History  Problem Relation Age of Onset  . Hypertension Mother   . Cancer Mother     brain (possibly started in lung)  . Heart disease Mother   . Diabetes Mother   . Other Mother     cardiomegaly  . Hypertension Father   . Emphysema Father   . Heart Problems Brother     pacemaker  . Heart Problems Other     "using 10% of heart"   Social History  Substance Use Topics  . Smoking status: Never Smoker   . Smokeless tobacco: Never Used  . Alcohol Use: Yes     Comment: occasional    Review of Systems  HENT: Positive for dental problem.   All other systems reviewed and are negative.     Allergies  Review of patient's allergies indicates no known allergies.  Home Medications   Prior to Admission medications   Medication Sig Start Date End Date Taking? Authorizing Provider  albuterol (PROVENTIL HFA;VENTOLIN HFA) 108 (90 BASE) MCG/ACT inhaler Inhale 2 puffs  into the lungs every 6 (six) hours as needed for wheezing or shortness of breath. 11/09/14   Percell Miller Saguier, PA-C  amLODipine (NORVASC) 10 MG tablet TAKE 1 TABLET (10 MG TOTAL) BY MOUTH DAILY. 11/09/14   [name], PA-C  aspirin 81 MG tablet Take 162 mg by mouth daily.     Historical Provider, MD  carvedilol (COREG) 6.25 MG tablet TAKE 1 TABLET (6.25 MG TOTAL) BY MOUTH 2 (TWO) TIMES DAILY WITH A MEAL. 10/16/14   Edward Saguier, PA-C  fluticasone (FLONASE) 50 MCG/ACT nasal spray Place 2 sprays into both nostrils daily. 11/09/14   Percell Miller Saguier, PA-C  furosemide (LASIX) 20 MG tablet Take 1 tablet (20 mg total) by mouth daily. 09/23/14   Amber Sena Slate, NP  lisinopril (PRINIVIL,ZESTRIL) 20 MG tablet Take 1 tablet (20 mg total) by mouth daily. 08/14/14   Percell Miller Saguier, PA-C  omeprazole (PRILOSEC) 20 MG capsule Take 1 capsule (20 mg total) by mouth daily. 11/09/14   Percell Miller Saguier, PA-C  rosuvastatin (CRESTOR) 10 MG tablet Take 1 tablet (10 mg total) by mouth daily. 11/13/14   Percell Miller Saguier, PA-C  sildenafil (VIAGRA) 25 MG tablet Take 1 tablet (25 mg total) by mouth daily as needed for erectile dysfunction (1 tab po as needed prior to sex. max 1 tab po q day). 08/26/14   Percell Miller Saguier, PA-C  tamsulosin (FLOMAX) 0.4 MG CAPS capsule TAKE 1 CAPSULE (0.4 MG TOTAL) BY MOUTH DAILY. 10/15/14   Edward Saguier, PA-C   BP 199/118 mmHg  Pulse 105  Temp(Src) 98.1 F (36.7 C) (Oral)  Resp 18  Ht 6\' 4"  (1.93 m)  Wt 97.977 kg  BMI 26.30 kg/m2  SpO2 100%   Physical Exam  Constitutional: He is oriented to person, place, and time. He appears well-developed and well-nourished.  HENT:  Head: Normocephalic and atraumatic.  Right Ear: Tympanic membrane and ear canal normal.  Left Ear: Tympanic membrane and ear canal normal.  Nose: Nose normal.  Mouth/Throat: Uvula is midline, oropharynx is clear and moist and mucous membranes are normal. Abnormal dentition. Dental abscesses present. No dental caries. No oropharyngeal exudate, posterior oropharyngeal edema, posterior oropharyngeal erythema or tonsillar abscesses.  Teeth largely in poor dentition, right upper pre-molar is broken and nearly flush with gum line, large cavity noted, surrounding gingiva swollen and erythematous with localized TTP, handling secretions appropriately, no trismus; mild swelling of right upper cheek without extension into neck; normal phonation without stridor  Eyes: Conjunctivae and EOM are normal. Pupils are equal, round, and reactive to light.  Neck: Normal range of motion. Neck supple.  Cardiovascular: Normal rate, regular rhythm and normal heart sounds.   Pulmonary/Chest:  Effort normal and breath sounds normal. No respiratory distress. He has no wheezes.  Abdominal: Soft. Bowel sounds are normal. There is no tenderness. There is no guarding.  Musculoskeletal: Normal range of motion.  Neurological: He is alert and oriented to person, place, and time.  Skin: Skin is warm and dry. He is not diaphoretic.  Psychiatric: He has a normal mood and affect.  Nursing note and vitals reviewed.   ED Course  Procedures (including critical care time) Labs Review Labs Reviewed - No data to display  Imaging Review No results found. I have personally reviewed and evaluated these images and lab results as part of my medical decision-making.   EKG Interpretation None      MDM   Final diagnoses:  Dental abscess   64 year old male here with right-sided facial pain. On exam  he does have some mild swelling of his right upper cheek without extension into neck. He is handling his secretions well, normal phonation without stridor. His right upper premolar is broken and flushed with gumline with large cavity noted. Appears to be developing dental abscess without drainable fluid collection at this time. I suspect this is the source of his facial pain. He is handling his secretions well currently without any neck swelling. I do not clinically suspect Ludwig's angina at this time. Patient is hypertensive this is likely multifactorial in that he has not taken his BP meds yet today and has also taken over-the-counter cold medication. I've instructed him to take Coricidin if needed for nasal congestion or URI symptoms as to not elevate his BP in the future. Will start on penicillin for abscess.  FU with dentist next week.  Discussed plan with patient, he/she acknowledged understanding and agreed with plan of care.  Return precautions given for new or worsening symptoms.  Larene Pickett, PA-C 12/25/14 [place], MD 12/25/14 [phone]

## 2014-12-25 NOTE — ED Notes (Signed)
Facial pain, swelling, headache, dental pain all on the right side. States he has been using nasal spray for what he felt was sinus pain. He has an appointment for dental extraction next month.

## 2014-12-25 NOTE — ED Notes (Signed)
BP noted. Pt states he did not take his BP medication today.

## 2014-12-28 ENCOUNTER — Telehealth: Payer: Self-pay | Admitting: Cardiology

## 2014-12-28 NOTE — Telephone Encounter (Signed)
Spoke with pt and reminded pt of remote transmission that is due today. Pt verbalized understanding.   

## 2014-12-30 ENCOUNTER — Telehealth: Payer: Self-pay

## 2014-12-30 DIAGNOSIS — N5201 Erectile dysfunction due to arterial insufficiency: Secondary | ICD-10-CM | POA: Diagnosis not present

## 2014-12-30 NOTE — Telephone Encounter (Signed)
ICM transmission received.  Attempted patient call and left message to return call.  

## 2015-01-07 NOTE — Telephone Encounter (Signed)
Call to patient.  He reported he is out of town.  I explained would reschedule the ICM transmission for 01/20/2015 and he agreed with the date.  He is doing fine at this time.      Received voice mail message from patient stating he was returning my call.

## 2015-01-12 DIAGNOSIS — H2512 Age-related nuclear cataract, left eye: Secondary | ICD-10-CM | POA: Diagnosis not present

## 2015-01-12 DIAGNOSIS — H18412 Arcus senilis, left eye: Secondary | ICD-10-CM | POA: Diagnosis not present

## 2015-01-12 DIAGNOSIS — H2511 Age-related nuclear cataract, right eye: Secondary | ICD-10-CM | POA: Diagnosis not present

## 2015-01-12 DIAGNOSIS — H18411 Arcus senilis, right eye: Secondary | ICD-10-CM | POA: Diagnosis not present

## 2015-01-13 NOTE — Telephone Encounter (Signed)
SPOKE TO TO ABOUT RESULTS

## 2015-01-20 ENCOUNTER — Ambulatory Visit (INDEPENDENT_AMBULATORY_CARE_PROVIDER_SITE_OTHER): Payer: Medicare Other

## 2015-01-20 ENCOUNTER — Other Ambulatory Visit: Payer: Self-pay | Admitting: Family Medicine

## 2015-01-20 DIAGNOSIS — Z950 Presence of cardiac pacemaker: Secondary | ICD-10-CM | POA: Diagnosis not present

## 2015-01-20 DIAGNOSIS — I5022 Chronic systolic (congestive) heart failure: Secondary | ICD-10-CM

## 2015-01-22 ENCOUNTER — Telehealth: Payer: Self-pay

## 2015-01-22 NOTE — Telephone Encounter (Signed)
Remote ICM transmission received.  Attempted patient call and left message for return call.   

## 2015-01-25 NOTE — Telephone Encounter (Signed)
Attempted ICM call and left message.   Received voice mail message from patient returning the call.  He stated he would be home rest of the day.

## 2015-01-25 NOTE — Progress Notes (Signed)
EPIC Encounter for ICM Monitoring  Patient Name: Phillip Booth is a 65 y.o. male Date: 01/25/2015 Primary Care Physican: [name], PA-C Primary Cardiologist: Claiborne Billings Electrophysiologist: Allred Dry Weight: 214 lbs   Bi-V Pacing >99%       In the past month, have you:  1. Gained more than 2 pounds in a day or more than 5 pounds in a week? no  2. Had changes in your medications (with verification of current medications)? no  3. Had more shortness of breath than is usual for you? no  4. Limited your activity because of shortness of breath? no  5. Not been able to sleep because of shortness of breath? no  6. Had increased swelling in your feet or ankles? no  7. Had symptoms of dehydration (dizziness, dry mouth, increased thirst, decreased urine output) no  8. Had changes in sodium restriction? no  9. Been compliant with medication? Yes   ICM trend: 3 month view 01/20/2015  ICM trend: 1 year view 01/20/2015   Follow-up plan: ICM clinic phone appointment 02/25/2015.  Corvue daily thoracic impedance below baseline 12/30/2014 to 01/05/2015 suggesting fluid retention but since 01/06/2015 has trending along baseline.  He denied any symptoms at this time.  He reported he is doing well except for a cold.   No changes today.   Copy of note sent to patient's primary care physician, primary cardiologist, and device following physician.  Rosalene Billings, RN, CCM 01/25/2015 3:20 PM

## 2015-01-26 NOTE — Telephone Encounter (Signed)
Spoke with patient.

## 2015-01-27 ENCOUNTER — Other Ambulatory Visit: Payer: Self-pay | Admitting: Medical

## 2015-01-27 DIAGNOSIS — E291 Testicular hypofunction: Secondary | ICD-10-CM | POA: Diagnosis not present

## 2015-02-25 ENCOUNTER — Telehealth: Payer: Self-pay | Admitting: Cardiology

## 2015-02-25 NOTE — Telephone Encounter (Signed)
Spoke with pt and reminded pt of remote transmission that is due today. Pt verbalized understanding.   

## 2015-03-05 NOTE — Progress Notes (Signed)
No remote ICM transmission sent for scheduled date of 02/25/2015.  Next remote ICM transmission scheduled for 04/06/2015.  Patient letter sent with new date.

## 2015-03-08 ENCOUNTER — Ambulatory Visit (INDEPENDENT_AMBULATORY_CARE_PROVIDER_SITE_OTHER): Payer: Medicare Other | Admitting: Medical

## 2015-03-08 ENCOUNTER — Encounter: Payer: Self-pay | Admitting: Medical

## 2015-03-08 ENCOUNTER — Ambulatory Visit (HOSPITAL_BASED_OUTPATIENT_CLINIC_OR_DEPARTMENT_OTHER)
Admission: RE | Admit: 2015-03-08 | Discharge: 2015-03-08 | Disposition: A | Discharge status: Home / Self Care | Payer: Medicare Other | Source: Ambulatory Visit | Attending: Medical | Admitting: Medical

## 2015-03-08 ENCOUNTER — Other Ambulatory Visit: Payer: Self-pay | Admitting: Medical

## 2015-03-08 VITALS — BP 118/78 | HR 62 | Temp 98.0°F | Ht 76.0 in | Wt 211.8 lb

## 2015-03-08 DIAGNOSIS — R05 Cough: Secondary | ICD-10-CM | POA: Insufficient documentation

## 2015-03-08 DIAGNOSIS — J209 Acute bronchitis, unspecified: Secondary | ICD-10-CM

## 2015-03-08 DIAGNOSIS — R059 Cough, unspecified: Secondary | ICD-10-CM

## 2015-03-08 MED ORDER — BENZONATATE 100 MG PO CAPS: 100.0000 mg | ORAL_CAPSULE | Freq: Three times a day (TID) | ORAL | Status: DC | PRN

## 2015-03-08 MED ORDER — AZITHROMYCIN 250 MG PO TABS: ORAL_TABLET | ORAL | Status: DC

## 2015-03-08 MED ORDER — CEFTRIAXONE SODIUM 1 G IJ SOLR
1.0000 g | Freq: Once | INTRAMUSCULAR | Status: AC
  Administered 2015-03-08: 1 g via INTRAMUSCULAR

## 2015-03-08 MED FILL — BENZONATATE 100 MG CAPSULE: 100 | 7 days supply | Qty: 21 | Fill #0

## 2015-03-08 MED FILL — AZITHROMYCIN 250 MG TABLET: 250 | 5 days supply | Qty: 6 | Fill #0

## 2015-03-08 NOTE — Progress Notes (Signed)
Pre visit review using our clinic review tool, if applicable. No additional management support is needed unless otherwise documented below in the visit note. 

## 2015-03-08 NOTE — Progress Notes (Signed)
Subjective:    Patient ID: Phillip Booth, male    DOB: July 04, 1950, 65 y.o.   MRN: [id number]  HPI   Pt in states about one week ago he had some body aches, chills, and  fever  but now he feels like he has chest congestion. Some occasional productive cough. Pt also feels tired.   Review of Systems  Constitutional: Positive for fever, chills and fatigue.  HENT: Positive for congestion. Negative for postnasal drip and sinus pressure.        Feels a lot of pnd. Only one day of sinus pressure early on.  Respiratory: Positive for cough. Negative for shortness of breath and wheezing.   Cardiovascular: Negative for chest pain and palpitations.  Gastrointestinal: Negative for abdominal pain and blood in stool.  Musculoskeletal: Negative for back pain.  Skin: Negative for pallor.  Neurological: Negative for dizziness and headaches.  Hematological: Negative for adenopathy. Does not bruise/bleed easily.  Psychiatric/Behavioral: Negative for behavioral problems and confusion.   Past Medical History  Diagnosis Date  . CVA (cerebral infarction)     a. diagnosis not clear  . Hypertension   . Hyperlipidemia   . Vertigo   . Symptomatic bradycardia     a. s/p STJ CRTP implanted by Dr Rayann Heman  . CAD (coronary artery disease)     a. moderate by cath 03/2014  . Non-ischemic cardiomyopathy ([place])     a. EF 45% by echo 03/2014  . Allergy   . Cataract   . Paroxysmal atrial fibrillation ([place]) 05/01/2014    asymptomatic, documented on PPM interrogation,  chads2vasc score is at least 6.  He is contemplating anticoagulation    Social History   Social History  . Marital Status: Married    Spouse Name: N/A  . Number of Children: N/A  . Years of Education: N/A   Occupational History  . Not on file.   Social History Main Topics  . Smoking status: Never Smoker   . Smokeless tobacco: Never Used  . Alcohol Use: Yes     Comment: occasional  . Drug Use: No  . Sexual Activity: Yes   Other Topics  Concern  . Not on file   Social History Narrative    Past Surgical History  Procedure Laterality Date  . Abdominal surgery    . Hernia repair    . Appendectomy    . Left heart catheterization with coronary angiogram N/A 03/19/2014    Procedure: LEFT HEART CATHETERIZATION WITH CORONARY ANGIOGRAM;  Surgeon: [name], MD;  Location: [facility] CATH LAB;  Service: Cardiovascular;  Laterality: N/A;  . Bi-ventricular pacemaker insertion N/A 03/19/2014    STJ CRTP implanted by Dr Rayann Heman    Family History  Problem Relation Age of Onset  . Hypertension Mother   . Cancer Mother     brain (possibly started in lung)  . Heart disease Mother   . Diabetes Mother   . Other Mother     cardiomegaly  . Hypertension Father   . Emphysema Father   . Heart Problems Brother     pacemaker  . Heart Problems Other     "using 10% of heart"    No Known Allergies  Current Outpatient Prescriptions on File Prior to Visit  Medication Sig Dispense Refill  . albuterol (PROVENTIL HFA;VENTOLIN HFA) 108 (90 BASE) MCG/ACT inhaler Inhale 2 puffs into the lungs every 6 (six) hours as needed for wheezing or shortness of breath. 1 Inhaler 0  . amLODipine (NORVASC) 10  MG tablet TAKE 1 TABLET (10 MG TOTAL) BY MOUTH DAILY. 90 tablet 1  . aspirin 81 MG tablet Take 162 mg by mouth daily.     . carvedilol (COREG) 6.25 MG tablet TAKE 1 TABLET (6.25 MG TOTAL) BY MOUTH 2 (TWO) TIMES DAILY WITH A MEAL. 90 tablet 0  . fluticasone (FLONASE) 50 MCG/ACT nasal spray Place 2 sprays into both nostrils daily. 16 g 1  . furosemide (LASIX) 20 MG tablet Take 1 tablet (20 mg total) by mouth daily. 90 tablet 3  . lisinopril (PRINIVIL,ZESTRIL) 20 MG tablet Take 1 tablet (20 mg total) by mouth daily. 90 tablet 0  . lisinopril (PRINIVIL,ZESTRIL) 20 MG tablet TAKE 1 TABLET (20 MG TOTAL) BY MOUTH DAILY. 30 tablet 2  . naproxen (NAPROSYN) 500 MG tablet Take 1 tablet (500 mg total) by mouth 2 (two) times daily with a meal. 30 tablet 0  .  omeprazole (PRILOSEC) 20 MG capsule Take 1 capsule (20 mg total) by mouth daily. 30 capsule 3  . penicillin v potassium (VEETID) 500 MG tablet Take 1 tablet (500 mg total) by mouth 4 (four) times daily. 40 tablet 0  . rosuvastatin (CRESTOR) 10 MG tablet Take 1 tablet (10 mg total) by mouth daily. 30 tablet 3  . sildenafil (VIAGRA) 25 MG tablet Take 1 tablet (25 mg total) by mouth daily as needed for erectile dysfunction (1 tab po as needed prior to sex. max 1 tab po q day). 10 tablet 0  . tamsulosin (FLOMAX) 0.4 MG CAPS capsule TAKE 1 CAPSULE (0.4 MG TOTAL) BY MOUTH DAILY. 90 capsule 1   No current facility-administered medications on file prior to visit.    BP 118/78 mmHg  Pulse 62  Temp(Src) 98 F (36.7 C) (Oral)  Ht 6\' 4"  (1.93 m)  Wt 211 lb 12.8 oz (96.072 kg)  BMI 25.79 kg/m2  SpO2 99%       Objective:   Physical Exam   General  Mental Status - Alert. General Appearance - Well groomed. Not in acute distress.  Skin Rashes- No Rashes.  HEENT Head- Normal. Ear Auditory Canal - Left- Normal. Right - Normal.Tympanic Membrane- Left- Normal. Right- Normal. Eye Sclera/Conjunctiva- Left- Normal. Right- Normal. Nose & Sinuses Nasal Mucosa- Left-  Boggy and Congested. Right-  Boggy and Congested.Bilateral no maxillary and no  frontal sinus pressure. Mouth & Throat Lips: Upper Lip- Normal: no dryness, cracking, pallor, cyanosis, or vesicular eruption. Lower Lip-Normal: no dryness, cracking, pallor, cyanosis or vesicular eruption. Buccal Mucosa- Bilateral- No Aphthous ulcers. Oropharynx- No Discharge or Erythema. Tonsils: Characteristics- Bilateral- No Erythema or Congestion. Size/Enlargement- Bilateral- No enlargement. Discharge- bilateral-None.  Neck Neck- Supple. No Masses.   Chest and Lung Exam Auscultation: Breath Sounds:-even and unlabored.(but left lower side of lung rough breath sounds/worse at the base).  Cardiovascular Auscultation:Rythm- Regular, rate and  rhythm. Murmurs & Other Heart Sounds:Ausculatation of the heart reveal- No Murmurs.  Lymphatic Head & Neck General Head & Neck Lymphatics: Bilateral: Description- No Localized lymphadenopathy.      Assessment & Plan:  You appear to have bronchitis. Rest hydrate and tylenol for fever. I am prescribing cough medicine benzonatate , and azithromycin antibiotic. For your nasal congestion you could use otc the counter nasal steroid flonase    Do want to get chest xray today to evaluate if you have any waking pneumonia.  Above likely post flu secondary bacterial illness.  Follow up in 7-10 days or as needed

## 2015-03-08 NOTE — Addendum Note (Signed)
Addended by: Tasia Catchings on: 03/08/2015 03:11 PM   Modules accepted: Orders

## 2015-03-08 NOTE — Patient Instructions (Addendum)
You appear to have bronchitis. Rest hydrate and tylenol for fever. I am prescribing cough medicine benzonatate , and azithromycin antibiotic. For your nasal congestion you could use otc the counter nasal steroid flonase    Do want to get chest xray today to evaluate if you have any waking pneumonia.  Above likely secondary bacterial illness post flu.  Follow up in 7-10 days or as needed

## 2015-03-08 NOTE — Addendum Note (Signed)
Addended by: Tasia Catchings on: 03/08/2015 03:09 PM   Modules accepted: Orders, Medications

## 2015-03-09 LAB — CBC WITH DIFFERENTIAL/PLATELET
BASOS ABS: 0 10*3/uL (ref 0.0–0.1)
Basophils Relative: 0.6 % (ref 0.0–3.0)
Eosinophils Absolute: 0.3 10*3/uL (ref 0.0–0.7)
Eosinophils Relative: 5.4 % — ABNORMAL HIGH (ref 0.0–5.0)
HEMATOCRIT: 40.6 % (ref 39.0–52.0)
Hemoglobin: 13.6 g/dL (ref 13.0–17.0)
LYMPHS PCT: 45.9 % (ref 12.0–46.0)
Lymphs Abs: 2.1 10*3/uL (ref 0.7–4.0)
MCHC: 33.6 g/dL (ref 30.0–36.0)
MCV: 90.6 fl (ref 78.0–100.0)
MONOS PCT: 8.5 % (ref 3.0–12.0)
Monocytes Absolute: 0.4 10*3/uL (ref 0.1–1.0)
Neutro Abs: 1.8 10*3/uL (ref 1.4–7.7)
Neutrophils Relative %: 39.6 % — ABNORMAL LOW (ref 43.0–77.0)
PLATELETS: 236 10*3/uL (ref 150.0–400.0)
RBC: 4.48 Mil/uL (ref 4.22–5.81)
RDW: 14.1 % (ref 11.5–15.5)
WBC: 4.7 10*3/uL (ref 4.0–10.5)

## 2015-03-23 ENCOUNTER — Other Ambulatory Visit: Payer: Self-pay | Admitting: Medical

## 2015-03-24 ENCOUNTER — Other Ambulatory Visit: Payer: Self-pay | Admitting: Medical

## 2015-03-29 DIAGNOSIS — H2511 Age-related nuclear cataract, right eye: Secondary | ICD-10-CM | POA: Diagnosis not present

## 2015-03-29 DIAGNOSIS — H25811 Combined forms of age-related cataract, right eye: Secondary | ICD-10-CM | POA: Diagnosis not present

## 2015-03-30 DIAGNOSIS — H2512 Age-related nuclear cataract, left eye: Secondary | ICD-10-CM | POA: Diagnosis not present

## 2015-04-06 ENCOUNTER — Ambulatory Visit (INDEPENDENT_AMBULATORY_CARE_PROVIDER_SITE_OTHER): Payer: Medicare Other

## 2015-04-06 DIAGNOSIS — Z950 Presence of cardiac pacemaker: Secondary | ICD-10-CM | POA: Diagnosis not present

## 2015-04-06 DIAGNOSIS — I5022 Chronic systolic (congestive) heart failure: Secondary | ICD-10-CM | POA: Diagnosis not present

## 2015-04-07 NOTE — Progress Notes (Signed)
EPIC Encounter for ICM Monitoring  Patient Name: Phillip Booth is a 65 y.o. male Date: 04/07/2015 Primary Care Physican: [name], PA-C Primary Cardiologist: Allred Electrophysiologist: Allred Dry Weight: 211 lbs   Bi-V Pacing >99%      In the past month, have you:  1. Gained more than 2 pounds in a day or more than 5 pounds in a week? no  2. Had changes in your medications (with verification of current medications)? no  3. Had more shortness of breath than is usual for you? no  4. Limited your activity because of shortness of breath? no  5. Not been able to sleep because of shortness of breath? no  6. Had increased swelling in your feet or ankles? no  7. Had symptoms of dehydration (dizziness, dry mouth, increased thirst, decreased urine output) no  8. Had changes in sodium restriction? no  9. Been compliant with medication? Yes   ICM trend: 3 month view for 04/07/2015   ICM trend: 1 year view for 04/07/2015   Follow-up plan: ICM clinic phone appointment on 05/11/2015.  Thoracic impedance below reference line from 03/03/2015 to 03/10/2015 suggesting fluid accumulation and returned to reference line on 03/29/2015.  Patient denied fluid symptoms.  Discussed effects of high sodium foods and education given to limit sodium intake to < 2000 mg and fluid intake to 64 oz daily.  Encouraged to call for any fluid symptoms.  No changes today.    Rosalene Billings, RN, CCM 04/07/2015 8:59 AM

## 2015-04-21 ENCOUNTER — Other Ambulatory Visit: Payer: Self-pay

## 2015-04-21 MED ORDER — LISINOPRIL 20 MG PO TABS: 20.0000 mg | ORAL_TABLET | Freq: Every day | ORAL | Status: DC

## 2015-05-08 ENCOUNTER — Other Ambulatory Visit: Payer: Self-pay | Admitting: Family

## 2015-05-11 ENCOUNTER — Ambulatory Visit (INDEPENDENT_AMBULATORY_CARE_PROVIDER_SITE_OTHER): Payer: Medicare Other

## 2015-05-11 ENCOUNTER — Telehealth: Payer: Self-pay

## 2015-05-11 DIAGNOSIS — I5022 Chronic systolic (congestive) heart failure: Secondary | ICD-10-CM

## 2015-05-11 DIAGNOSIS — Z95810 Presence of automatic (implantable) cardiac defibrillator: Secondary | ICD-10-CM

## 2015-05-11 NOTE — Telephone Encounter (Signed)
Call to patient.  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  He stated he would call me back or I can attempt to call him tomorrow.

## 2015-05-11 NOTE — Progress Notes (Signed)
EPIC Encounter for ICM Monitoring  Patient Name: Phillip Booth is a 65 y.o. male Date: 05/11/2015 Primary Care Physican: [name], PA-C Primary Cardiologist: Allred Electrophysiologist: Allred Dry Weight: unknown  Bi-V Pacing >99%      In the past month, have you:  1. Gained more than 2 pounds in a day or more than 5 pounds in a week? N/A  2. Had changes in your medications (with verification of current medications)? N/A  3. Had more shortness of breath than is usual for you? N/A  4. Limited your activity because of shortness of breath? N/A  5. Not been able to sleep because of shortness of breath? N/A  6. Had increased swelling in your feet or ankles? N/A  7. Had symptoms of dehydration (dizziness, dry mouth, increased thirst, decreased urine output) N/A  8. Had changes in sodium restriction? N/A  9. Been compliant with medication? N/A  ICM trend: 3 month view for 05/11/2015   ICM trend: 1 year view for 05/11/2015    Follow-up plan: ICM clinic phone appointment 06/11/2015.   Attempted call to patient and unable to reach.  Transmission reviewed.  FLUID LEVELS: [place] thoracic impedance decreased 05/05/2015 to 05/10/2015 suggesting fluid accumulation and returned back to baseline 05/10/2015.     Rosalene Billings, RN, CCM 05/11/2015 1:57 PM

## 2015-06-11 ENCOUNTER — Ambulatory Visit (INDEPENDENT_AMBULATORY_CARE_PROVIDER_SITE_OTHER): Payer: Medicare Other

## 2015-06-11 ENCOUNTER — Telehealth: Payer: Self-pay

## 2015-06-11 DIAGNOSIS — I5022 Chronic systolic (congestive) heart failure: Secondary | ICD-10-CM | POA: Diagnosis not present

## 2015-06-11 DIAGNOSIS — Z95810 Presence of automatic (implantable) cardiac defibrillator: Secondary | ICD-10-CM

## 2015-06-11 NOTE — Progress Notes (Signed)
EPIC Encounter for ICM Monitoring  Patient Name: Phillip Booth is a 65 y.o. male Date: 06/11/2015 Primary Care Physican: [name], PA-C Primary Cardiologist: Allred Electrophysiologist: Allred Dry Weight: unknown   Bi-V Pacing >99%      In the past month, have you:  1. Gained more than 2 pounds in a day or more than 5 pounds in a week? N/A  2. Had changes in your medications (with verification of current medications)? N/A  3. Had more shortness of breath than is usual for you? N/A  4. Limited your activity because of shortness of breath? N/A  5. Not been able to sleep because of shortness of breath? N/A  6. Had increased swelling in your feet or ankles? N/A  7. Had symptoms of dehydration (dizziness, dry mouth, increased thirst, decreased urine output) N/A  8. Had changes in sodium restriction? N/A  9. Been compliant with medication? N/A   ICM trend: 3 month view for 06/11/2015   ICM trend: 1 year view for 06/11/2015   Follow-up plan: ICM clinic phone appointment on 07/21/2015.  Attempted call to patient and unable to reach.  Transmission reviewed.  Thoracic impedance below reference line from 05/17/2015 to 05/19/2015 suggesting fluid accumulation and return to baseline 05/19/2015 suggesting stable fluid levels.     Rosalene Billings, RN, CCM 06/11/2015 2:41 PM

## 2015-06-11 NOTE — Telephone Encounter (Signed)
Remote ICM transmission received.  Attempted patient call and left message for return call.   

## 2015-06-18 ENCOUNTER — Other Ambulatory Visit: Payer: Self-pay | Admitting: Medical

## 2015-07-21 ENCOUNTER — Encounter: Payer: Self-pay | Admitting: Nurse Practitioner

## 2015-07-26 NOTE — Progress Notes (Signed)
No ICM remote transmission received for 07/20/2015.  Rescheduled for 08/12/2015.

## 2015-07-28 ENCOUNTER — Telehealth: Payer: Self-pay

## 2015-07-28 ENCOUNTER — Ambulatory Visit (INDEPENDENT_AMBULATORY_CARE_PROVIDER_SITE_OTHER): Payer: Medicare Other

## 2015-07-28 DIAGNOSIS — Z95810 Presence of automatic (implantable) cardiac defibrillator: Secondary | ICD-10-CM

## 2015-07-28 DIAGNOSIS — I5022 Chronic systolic (congestive) heart failure: Secondary | ICD-10-CM | POA: Diagnosis not present

## 2015-07-28 NOTE — Telephone Encounter (Signed)
Spoke with patient and he stated he has been out of town and that is the reason why I did not receive ICM remote transmission.  He wanted to send remote today but the monitor was not working. He will call [employer] today and get assistance.

## 2015-07-28 NOTE — Progress Notes (Signed)
EPIC Encounter for ICM Monitoring  Patient Name: Phillip Booth is a 65 y.o. male Date: 07/28/2015 Primary Care Physican: [name], PA-C Primary Cardiologist: Allred  Electrophysiologist: Allred Dry Weight: unknown Bi-V Pacing:  >99%       Heart Failure questions reviewed, pt asymptomatic.     Thoracic impedence stable.  Recommendations: No changes.    ICM trend: 07/28/2015     Follow-up plan: ICM clinic phone appointment on 08/31/2015.  Copy of ICM check sent to device physician.   Rosalene Billings, RN 07/28/2015 3:23 PM

## 2015-08-06 ENCOUNTER — Other Ambulatory Visit: Payer: Self-pay | Admitting: Medical

## 2015-08-14 ENCOUNTER — Other Ambulatory Visit: Payer: Self-pay | Admitting: Medical

## 2015-08-18 NOTE — Telephone Encounter (Signed)
Pharmacy called in to follow up on refill request for pt's fluticasone.      Thanks.

## 2015-08-24 ENCOUNTER — Encounter: Payer: Self-pay | Admitting: Nurse Practitioner

## 2015-08-31 ENCOUNTER — Ambulatory Visit (INDEPENDENT_AMBULATORY_CARE_PROVIDER_SITE_OTHER): Payer: Medicare Other

## 2015-08-31 DIAGNOSIS — Z95810 Presence of automatic (implantable) cardiac defibrillator: Secondary | ICD-10-CM

## 2015-08-31 DIAGNOSIS — I5022 Chronic systolic (congestive) heart failure: Secondary | ICD-10-CM | POA: Diagnosis not present

## 2015-08-31 NOTE — Progress Notes (Signed)
EPIC Encounter for ICM Monitoring  Patient Name: Phillip Booth is a 65 y.o. male Date: 08/31/2015 Primary Care Physican: [name], PA-C Primary Cardiologist: Allred  Electrophysiologist: Allred Dry Weight:  unknown Bi-V Pacing:  >99%       Heart Failure questions reviewed, pt asymptomatic   Thoracic impedance normal.  Recommendations: No changes.  Low sodium diet education provided.    Follow-up plan: ICM clinic phone appointment on 10/21/2015.  Office appointment with Chanetta Marshall, NP on 09/20/2015.   Copy of ICM check sent to device physician.   ICM trend: 08/31/2015       Rosalene Billings, RN 08/31/2015 8:45 AM

## 2015-09-15 ENCOUNTER — Other Ambulatory Visit: Payer: Self-pay | Admitting: Medical

## 2015-09-20 ENCOUNTER — Ambulatory Visit: Payer: Medicare Other | Admitting: Nurse Practitioner

## 2015-09-20 NOTE — Progress Notes (Deleted)
Electrophysiology Office Note Date: 09/22/2015  ID:  Phillip Booth, DOB Aug 06, 1950, MRN [id number]  PCP: Elise Benne Primary Cardiologist: Claiborne Billings Electrophysiologist: Allred  CC: Routine pacemaker follow up  Phillip Booth is a 65 y.o. male seen today for Dr Rayann Heman. He underwent CRTP implantation in March of 2016 and has had significant functional improvement in symptoms. Since last being seen in our clinic, the patient reports doing ***well.  He has *** had worsening shortness of breath and abdominal distention. He reports marginal compliance with low salt diet. He is short of breath with moderate exertion and is Class 2b today.  He denies chest pain, palpitations, PND, orthopnea, nausea, vomiting, dizziness, syncope, edema, weight gain, or early satiety.  Device History: STJ CRTP implanted 2016 for NICM, CHF   Past Medical History:  Diagnosis Date  . Allergy   . CAD (coronary artery disease)    a. moderate by cath 03/2014  . Cataract   . CVA (cerebral infarction)    a. diagnosis not clear  . Hyperlipidemia   . Hypertension   . Non-ischemic cardiomyopathy ([place])    a. EF 45% by echo 03/2014  . Paroxysmal atrial fibrillation ([place]) 05/01/2014   asymptomatic, documented on PPM interrogation,  chads2vasc score is at least 6.  He is contemplating anticoagulation  . Symptomatic bradycardia    a. s/p STJ CRTP implanted by Dr Rayann Heman  . Vertigo    Past Surgical History:  Procedure Laterality Date  . ABDOMINAL SURGERY    . APPENDECTOMY    . BI-VENTRICULAR PACEMAKER INSERTION N/A 03/19/2014   STJ CRTP implanted by Dr Rayann Heman  . HERNIA REPAIR    . LEFT HEART CATHETERIZATION WITH CORONARY ANGIOGRAM N/A 03/19/2014   Procedure: LEFT HEART CATHETERIZATION WITH CORONARY ANGIOGRAM;  Surgeon: [name], MD;  Location: [facility] CATH LAB;  Service: Cardiovascular;  Laterality: N/A;    Current Outpatient Prescriptions  Medication Sig Dispense Refill  . amLODipine (NORVASC) 10 MG tablet  TAKE 1 TABLET (10 MG TOTAL) BY MOUTH DAILY. 90 tablet 1  . aspirin 81 MG tablet Take 162 mg by mouth daily.     Marland Kitchen azithromycin (ZITHROMAX) 250 MG tablet Take 2 tablets by mouth on day 1, followed by 1 tablet by mouth daily for 4 days. 6 tablet 0  . benzonatate (TESSALON) 100 MG capsule Take 1 capsule (100 mg total) by mouth 3 (three) times daily as needed. 21 capsule 0  . carvedilol (COREG) 6.25 MG tablet TAKE 1 TABLET (6.25 MG TOTAL) BY MOUTH 2 (TWO) TIMES DAILY WITH A MEAL. 60 tablet 0  . fluticasone (FLONASE) 50 MCG/ACT nasal spray PLACE 2 SPRAYS INTO BOTH NOSTRILS DAILY. 16 g 2  . furosemide (LASIX) 20 MG tablet Take 1 tablet (20 mg total) by mouth daily. 90 tablet 3  . lisinopril (PRINIVIL,ZESTRIL) 20 MG tablet TAKE 1 TABLET BY MOUTH EVERY DAY 30 tablet 0  . naproxen (NAPROSYN) 500 MG tablet Take 1 tablet (500 mg total) by mouth 2 (two) times daily with a meal. 30 tablet 0  . omeprazole (PRILOSEC) 20 MG capsule Take 1 capsule (20 mg total) by mouth daily. 30 capsule 3  . rosuvastatin (CRESTOR) 10 MG tablet TAKE 1 TABLET (10 MG TOTAL) BY MOUTH DAILY. 30 tablet 3  . sildenafil (VIAGRA) 25 MG tablet Take 1 tablet (25 mg total) by mouth daily as needed for erectile dysfunction (1 tab po as needed prior to sex. max 1 tab po q day). 10 tablet 0  .  tamsulosin (FLOMAX) 0.4 MG CAPS capsule TAKE 1 CAPSULE (0.4 MG TOTAL) BY MOUTH DAILY. 90 capsule 1  . VENTOLIN HFA 108 (90 Base) MCG/ACT inhaler INHALE 2 PUFFS INTO THE LUNGS EVERY 6 (SIX) HOURS AS NEEDED FOR WHEEZING OR SHORTNESS OF BREATH. 18 Inhaler 0   No current facility-administered medications for this visit.     Allergies:   Review of patient's allergies indicates no known allergies.   Social History: Social History   Social History  . Marital status: Married    Spouse name: N/A  . Number of children: N/A  . Years of education: N/A   Occupational History  . Not on file.   Social History Main Topics  . Smoking status: Never Smoker  .  Smokeless tobacco: Never Used  . Alcohol use Yes     Comment: occasional  . Drug use: No  . Sexual activity: Yes   Other Topics Concern  . Not on file   Social History Narrative  . No narrative on file    Family History: Family History  Problem Relation Age of Onset  . Hypertension Mother   . Cancer Mother     brain (possibly started in lung)  . Heart disease Mother   . Diabetes Mother   . Other Mother     cardiomegaly  . Hypertension Father   . Emphysema Father   . Heart Problems Brother     pacemaker  . Heart Problems Other     "using 10% of heart"     Review of Systems: All other systems reviewed and are otherwise negative except as noted above.   Physical Exam: VS:  There were no vitals taken for this visit. , BMI There is no height or weight on file to calculate BMI.  GEN- The patient is well appearing, alert and oriented x 3 today.  HEENT: normocephalic, atraumatic; sclera clear, conjunctiva pink; hearing intact; oropharynx clear; neck supple Lymph- no cervical lymphadenopathy Lungs- Clear to ausculation bilaterally, normal work of breathing. No wheezes, rales, rhonchi Heart- Regular rate and rhythm, 2/6 SEM GI- soft, non-tender, non-distended, bowel sounds present Extremities- no clubbing, cyanosis, or edema; DP/PT/radial pulses 2+ bilaterally MS- no significant deformity or atrophy Skin- warm and dry, no rash or lesion; PPM pocket well healed Psych- euthymic mood, full affect Neuro- strength and sensation are intact  Wt Readings from Last 3 Encounters:  03/08/15 211 lb 12.8 oz (96.1 kg)  12/25/14 216 lb (98 kg)  12/17/14 215 lb 9.6 oz (97.8 kg)    PPM Interrogation- reviewed in detail today,  See PACEART report  EKG:  EKG is not ordered today.  Recent Labs: 12/17/2014: ALT 18; BUN 13; Creatinine, Ser 0.87; Potassium 4.2; Pro B Natriuretic peptide (BNP) 191.0; Sodium 138 03/08/2015: Hemoglobin 13.6; Platelets 236.0   Wt Readings from Last 3  Encounters:  03/08/15 211 lb 12.8 oz (96.1 kg)  12/25/14 216 lb (98 kg)  12/17/14 215 lb 9.6 oz (97.8 kg)     Other studies Reviewed: Additional studies/ records that were reviewed today include: Dr Jackalyn Lombard last office note  Assessment and Plan:  1.  Sick sinus syndrome/Mobitz II heart block Normal CRTP function See Pace Art report No changes today  2.  NICM/chronic systolic heart failure Euvolemic on exam Continue medical therapy  3.  Paroxysmal atrial fibrillation *** Burden is <1%  Today and episodes are atrial tach, not atrial fibrillation. All episodes <5 minutes CHADS2VASC is at least 6 Pt previously declined [place]  If  he has mode switches that are true atrial fibrillation, will need discussion about anticoagulation  4.  HTN Stable No change required today   Current medicines are reviewed at length with the patient today.   The patient does not have concerns regarding his medicines.  The following changes were made today:  none  Labs/ tests ordered today include:  No orders of the defined types were placed in this encounter.    Disposition:   Follow up with Delilah Shan, [place] clinic, follow up with me in 1 year   Signed, Chanetta Marshall, NP 09/22/2015 7:35 AM  [place] [address] [phone] (office) [phone] (fax)

## 2015-09-20 NOTE — Progress Notes (Signed)
Next ICM remote transmission scheduled for 09/28/2015 due to appointment with Chanetta Marshall, NP was canceled for 09/20/2015.

## 2015-09-22 ENCOUNTER — Encounter: Payer: Medicare Other | Admitting: Nurse Practitioner

## 2015-09-23 ENCOUNTER — Encounter: Payer: Self-pay | Admitting: Internal Medicine

## 2015-09-23 ENCOUNTER — Encounter: Payer: Self-pay | Admitting: Nurse Practitioner

## 2015-09-23 ENCOUNTER — Ambulatory Visit (INDEPENDENT_AMBULATORY_CARE_PROVIDER_SITE_OTHER): Payer: Medicare Other | Admitting: Nurse Practitioner

## 2015-09-23 ENCOUNTER — Ambulatory Visit: Payer: Medicare Other | Admitting: Pharmacist

## 2015-09-23 VITALS — BP 160/96 | HR 60 | Ht 76.0 in | Wt 219.2 lb

## 2015-09-23 DIAGNOSIS — R0602 Shortness of breath: Secondary | ICD-10-CM | POA: Diagnosis not present

## 2015-09-23 DIAGNOSIS — I441 Atrioventricular block, second degree: Secondary | ICD-10-CM

## 2015-09-23 DIAGNOSIS — I480 Paroxysmal atrial fibrillation: Secondary | ICD-10-CM | POA: Diagnosis not present

## 2015-09-23 DIAGNOSIS — I509 Heart failure, unspecified: Secondary | ICD-10-CM | POA: Diagnosis not present

## 2015-09-23 DIAGNOSIS — I5022 Chronic systolic (congestive) heart failure: Secondary | ICD-10-CM | POA: Diagnosis not present

## 2015-09-23 DIAGNOSIS — I10 Essential (primary) hypertension: Secondary | ICD-10-CM

## 2015-09-23 LAB — CBC
HCT: 42.4 % (ref 38.5–50.0)
Hemoglobin: 13.9 g/dL (ref 13.2–17.1)
MCH: 30.2 pg (ref 27.0–33.0)
MCHC: 32.8 g/dL (ref 32.0–36.0)
MCV: 92 fL (ref 80.0–100.0)
MPV: 11.2 fL (ref 7.5–12.5)
PLATELETS: 191 10*3/uL (ref 140–400)
RBC: 4.61 MIL/uL (ref 4.20–5.80)
RDW: 14.3 % (ref 11.0–15.0)
WBC: 3.8 10*3/uL (ref 3.8–10.8)

## 2015-09-23 NOTE — Patient Instructions (Addendum)
Medication Instructions:    Your physician recommends that you continue on your current medications as directed. Please refer to the Current Medication list given to you today.  If you need a refill on your cardiac medications before your next appointment, please call your pharmacy.  Labwork: BMET BNP AND CBC   Testing/Procedures:  Your physician has requested that you have an echocardiogram. Echocardiography is a painless test that uses sound waves to create images of your heart. It provides your doctor with information about the size and shape of your heart and how well your heart's chambers and valves are working. This procedure takes approximately one hour. There are no restrictions for this procedure.  A chest x-ray takes a picture of the organs and structures inside the chest, including the heart, lungs, and blood vessels. This test can show several things, including, whether the heart is enlarges; whether fluid is building up in the lungs; and whether pacemaker / defibrillator leads are still in place.  [place].Marland Kitchen   Follow-Up: IN Phillip Booth IN 3 MONTHS  Any Other Special Instructions Will Be Listed Below (If Applicable).

## 2015-09-24 LAB — BASIC METABOLIC PANEL
BUN: 15 mg/dL (ref 7–25)
CALCIUM: 9.3 mg/dL (ref 8.6–10.3)
CO2: 26 mmol/L (ref 20–31)
CREATININE: 1.06 mg/dL (ref 0.70–1.25)
Chloride: 106 mmol/L (ref 98–110)
Glucose, Bld: 82 mg/dL (ref 65–99)
Potassium: 4.5 mmol/L (ref 3.5–5.3)
SODIUM: 140 mmol/L (ref 135–146)

## 2015-09-24 LAB — BRAIN NATRIURETIC PEPTIDE: BRAIN NATRIURETIC PEPTIDE: 153.4 pg/mL — AB (ref <100)

## 2015-09-24 NOTE — Progress Notes (Signed)
Electrophysiology Office Note Date: 09/24/2015  ID:  Phillip Booth, DOB [birth year], MRN [id number]  PCP: Elise Benne Primary Cardiologist: Claiborne Billings Electrophysiologist: Allred  CC: routine pacemaker follow up  Phillip Booth is a 65 y.o. male seen today for Dr Rayann Heman. He underwent CRTP implantation in March of 2016 and has had significant functional improvement in symptoms. 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  This has resulted in being able to exercise less and eating out more.  He has noticed some weight gain with abdominal distention. He also has some increased shortness of breath.  He is concerned about post nasal drip and "being able to get enough air to sing".  He denies chest pain, palpitations, PND, orthopnea, nausea, vomiting, dizziness, syncope, or early satiety.  Device History: STJ CRTP implanted 2016 for NICM, CHF   Past Medical History:  Diagnosis Date  . Allergy   . CAD (coronary artery disease)    a. moderate by cath 03/2014  . Cataract   . CVA (cerebral infarction)    a. diagnosis not clear  . Hyperlipidemia   . Hypertension   . Non-ischemic cardiomyopathy ([place])    a. EF 45% by echo 03/2014  . Paroxysmal atrial fibrillation ([place]) 05/01/2014   asymptomatic, documented on PPM interrogation,  chads2vasc score is at least 6.  He is contemplating anticoagulation  . Symptomatic bradycardia    a. s/p STJ CRTP implanted by Dr Rayann Heman  . Vertigo    Past Surgical History:  Procedure Laterality Date  . ABDOMINAL SURGERY    . APPENDECTOMY    . BI-VENTRICULAR PACEMAKER INSERTION N/A 03/19/2014   STJ CRTP implanted by Dr Rayann Heman  . HERNIA REPAIR    . LEFT HEART CATHETERIZATION WITH CORONARY ANGIOGRAM N/A 03/19/2014   Procedure: LEFT HEART CATHETERIZATION WITH CORONARY ANGIOGRAM;  Surgeon: [name], MD;  Location: [facility] CATH LAB;  Service: Cardiovascular;  Laterality: N/A;    Current Outpatient Prescriptions  Medication Sig  Dispense Refill  . amLODipine (NORVASC) 10 MG tablet TAKE 1 TABLET (10 MG TOTAL) BY MOUTH DAILY. 90 tablet 1  . aspirin 81 MG tablet Take 81 mg by mouth daily.     . carvedilol (COREG) 6.25 MG tablet TAKE 1 TABLET (6.25 MG TOTAL) BY MOUTH 2 (TWO) TIMES DAILY WITH A MEAL. 60 tablet 0  . fluticasone (FLONASE) 50 MCG/ACT nasal spray PLACE 2 SPRAYS INTO BOTH NOSTRILS DAILY. 16 g 2  . furosemide (LASIX) 20 MG tablet Take 1 tablet (20 mg total) by mouth daily. 90 tablet 3  . lisinopril (PRINIVIL,ZESTRIL) 20 MG tablet TAKE 1 TABLET BY MOUTH EVERY DAY 30 tablet 0  . omeprazole (PRILOSEC) 20 MG capsule Take 1 capsule (20 mg total) by mouth daily. 30 capsule 3  . rosuvastatin (CRESTOR) 10 MG tablet TAKE 1 TABLET (10 MG TOTAL) BY MOUTH DAILY. 30 tablet 3  . sildenafil (VIAGRA) 25 MG tablet Take 1 tablet (25 mg total) by mouth daily as needed for erectile dysfunction (1 tab po as needed prior to sex. max 1 tab po q day). 10 tablet 0  . tamsulosin (FLOMAX) 0.4 MG CAPS capsule TAKE 1 CAPSULE (0.4 MG TOTAL) BY MOUTH DAILY. 90 capsule 1  . VENTOLIN HFA 108 (90 Base) MCG/ACT inhaler INHALE 2 PUFFS INTO THE LUNGS EVERY 6 (SIX) HOURS AS NEEDED FOR WHEEZING OR SHORTNESS OF BREATH. 18 Inhaler 0   No current facility-administered medications for this visit.     Allergies:  Review of patient's allergies indicates no known allergies.   Social History: Social History   Social History  . Marital status: Married    Spouse name: N/A  . Number of children: N/A  . Years of education: N/A   Occupational History  . Not on file.   Social History Main Topics  . Smoking status: Never Smoker  . Smokeless tobacco: Never Used  . Alcohol use Yes     Comment: occasional  . Drug use: No  . Sexual activity: Yes   Other Topics Concern  . Not on file   Social History Narrative  . No narrative on file    Family History: Family History  Problem Relation Age of Onset  . Hypertension Mother   . Cancer Mother      brain (possibly started in lung)  . Heart disease Mother   . Diabetes Mother   . Other Mother     cardiomegaly  . Hypertension Father   . Emphysema Father   . Heart Problems Brother     pacemaker  . Heart Problems Other     "using 10% of heart"     Review of Systems: All other systems reviewed and are otherwise negative except as noted above.   Physical Exam: VS:  BP (!) 160/96   Pulse 60   Ht 6\' 4"  (1.93 m)   Wt 219 lb 3.2 oz (99.4 kg)   BMI 26.68 kg/m  , BMI Body mass index is 26.68 kg/m.  GEN- The patient is well appearing, alert and oriented x 3 today.  HEENT: normocephalic, atraumatic; sclera clear, conjunctiva pink; hearing intact; oropharynx clear; neck supple Lungs- Clear to ausculation bilaterally, normal work of breathing. No wheezes, rales, rhonchi Heart- Regular rate and rhythm, 2/6 SEM GI- soft, non-tender, non-distended, bowel sounds present Extremities- no clubbing, cyanosis, or edema; DP/PT/radial pulses 2+ bilaterally MS- no significant deformity or atrophy Skin- warm and dry, no rash or lesion; PPM pocket well healed Psych- euthymic mood, full affect Neuro- strength and sensation are intact  Wt Readings from Last 3 Encounters:  09/23/15 219 lb 3.2 oz (99.4 kg)  03/08/15 211 lb 12.8 oz (96.1 kg)  12/25/14 216 lb (98 kg)    PPM Interrogation- reviewed in detail today,  See PACEART report  EKG:  EKG is not ordered today.  Recent Labs: 12/17/2014: ALT 18; Pro B Natriuretic peptide (BNP) 191.0 09/23/2015: BUN 15; Creat 1.06; Hemoglobin 13.9; Platelets 191; Potassium 4.5; Sodium 140   Wt Readings from Last 3 Encounters:  09/23/15 219 lb 3.2 oz (99.4 kg)  03/08/15 211 lb 12.8 oz (96.1 kg)  12/25/14 216 lb (98 kg)     Other studies Reviewed: Additional studies/ records that were reviewed today include: Dr Jackalyn Lombard last office note  Assessment and Plan:  1.  Sick sinus syndrome/Mobitz II heart block Normal CRTP function See Pace Art  report No changes today  2.  NICM/chronic systolic heart failure Euvolemic on exam today but subjectively with worsening HF symptoms Will update labs, CXR and repeat echo Continue current medical therapy for now  3.  Paroxysmal atrial fibrillation [place]den is <1%  Today and episodes are atrial tach, not atrial fibrillation. All episodes <5 minutes CHADS2VASC is at least 6 If he has mode switches that are true atrial fibrillation, will need discussion about anticoagulation again.   4.  HTN BP elevated today but stable at home  Continue current therapy   5.  Post nasal drip Encouraged daily antihistamine and  consistent Flonase use Follow up with PCP    Current medicines are reviewed at length with the patient today.   The patient does not have concerns regarding his medicines.  The following changes were made today:  none  Labs/ tests ordered today include:  Orders Placed This Encounter  Procedures  . DG Chest 2 View  . Basic Metabolic Panel (BMET)  . B Nat Peptide  . CBC  . EKG 12-Lead  . ECHOCARDIOGRAM COMPLETE     Disposition:   Follow up with [facility], [place] [facility], follow up with me in 3 months   Signed, Chanetta Marshall, NP 09/24/2015 9:02 AM  [facility] [address] [place] [address] [phone] (office) [phone] (fax)

## 2015-09-27 ENCOUNTER — Telehealth: Payer: Self-pay | Admitting: *Deleted

## 2015-09-27 NOTE — Telephone Encounter (Signed)
-----   Message from Patsey Berthold, NP sent at 09/24/2015  9:20 AM EDT ----- Please notify patient of normal labs

## 2015-09-27 NOTE — Telephone Encounter (Signed)
SPOKE TO PT ABOUT RESULTS AND VERBALIZED UNDERSTANDING  

## 2015-09-28 ENCOUNTER — Ambulatory Visit (INDEPENDENT_AMBULATORY_CARE_PROVIDER_SITE_OTHER): Payer: Medicare Other

## 2015-09-28 DIAGNOSIS — Z95810 Presence of automatic (implantable) cardiac defibrillator: Secondary | ICD-10-CM

## 2015-09-28 DIAGNOSIS — I5022 Chronic systolic (congestive) heart failure: Secondary | ICD-10-CM | POA: Diagnosis not present

## 2015-09-28 NOTE — Progress Notes (Signed)
EPIC Encounter for ICM Monitoring  Patient Name: Phillip Booth is a 65 y.o. male Date: 09/28/2015 Primary Care Physican: Elise Benne rimary Cardiologist:Allred  Electrophysiologist: Allred Dry Weight:     unknown Bi-V Pacing:  >99%           Attempted ICM call and unable to reach.  Transmission reviewed.   Thoracic impedance abnormal suggesting fluid accumulation since 09/26/2015.  Recommendations:   None, unable to reach patient.   Follow-up plan: ICM clinic phone appointment on 10/06/2015.  Copy of ICM check sent to device physician.   ICM trend: 09/28/2015       Rosalene Billings, RN 09/28/2015 4:05 PM

## 2015-09-29 ENCOUNTER — Telehealth: Payer: Self-pay | Admitting: Internal Medicine

## 2015-09-29 NOTE — Telephone Encounter (Signed)
F/u Message  Pt call stating he was returning RN call. Please call back to discuss

## 2015-10-01 NOTE — Progress Notes (Signed)
Call back to patient.  Impedance abnormal since 09/26/2015 suggesting fluid accumulation.  He stated he has had some shortness of breath in the last week.  Advised to increase Furosemide 20 mg to 1 tablet bid x 3 days and then return to prescribed dosage of 1 tablet daily.  He verbalized understanding.  Recheck fluid level on 10/06/2015.

## 2015-10-01 NOTE — Telephone Encounter (Signed)
Spoke with patient.

## 2015-10-06 ENCOUNTER — Ambulatory Visit (INDEPENDENT_AMBULATORY_CARE_PROVIDER_SITE_OTHER): Payer: Medicare Other

## 2015-10-06 DIAGNOSIS — I5022 Chronic systolic (congestive) heart failure: Secondary | ICD-10-CM

## 2015-10-06 DIAGNOSIS — Z95810 Presence of automatic (implantable) cardiac defibrillator: Secondary | ICD-10-CM

## 2015-10-06 NOTE — Progress Notes (Signed)
EPIC Encounter for ICM Monitoring  Patient Name: Phillip Booth is a 65 y.o. male Date: 10/06/2015 Primary Care Physican: Elise Benne Primary Cardiologist:Allred  Electrophysiologist: Allred Dry Weight:unknown Bi-V Pacing: >99%        Heart Failure questions reviewed, pt asymptomatic   Thoracic impedance normal   Recommendations: No changes.      Follow-up plan: ICM clinic phone appointment on 11/03/2015.  Copy of ICM check sent to device physician.   ICM trend: 10/06/2015       Rosalene Billings, RN 10/06/2015 8:41 AM

## 2015-10-07 ENCOUNTER — Other Ambulatory Visit: Payer: Self-pay

## 2015-10-07 ENCOUNTER — Ambulatory Visit (HOSPITAL_COMMUNITY): Payer: Medicare Other | Attending: Cardiology

## 2015-10-07 DIAGNOSIS — E785 Hyperlipidemia, unspecified: Secondary | ICD-10-CM | POA: Diagnosis not present

## 2015-10-07 DIAGNOSIS — I480 Paroxysmal atrial fibrillation: Secondary | ICD-10-CM | POA: Diagnosis not present

## 2015-10-07 DIAGNOSIS — I340 Nonrheumatic mitral (valve) insufficiency: Secondary | ICD-10-CM | POA: Diagnosis not present

## 2015-10-07 DIAGNOSIS — Z8673 Personal history of transient ischemic attack (TIA), and cerebral infarction without residual deficits: Secondary | ICD-10-CM | POA: Insufficient documentation

## 2015-10-07 DIAGNOSIS — I371 Nonrheumatic pulmonary valve insufficiency: Secondary | ICD-10-CM | POA: Diagnosis not present

## 2015-10-07 DIAGNOSIS — I071 Rheumatic tricuspid insufficiency: Secondary | ICD-10-CM | POA: Insufficient documentation

## 2015-10-07 DIAGNOSIS — I110 Hypertensive heart disease with heart failure: Secondary | ICD-10-CM | POA: Diagnosis not present

## 2015-10-07 DIAGNOSIS — I2510 Atherosclerotic heart disease of native coronary artery without angina pectoris: Secondary | ICD-10-CM | POA: Diagnosis not present

## 2015-10-07 DIAGNOSIS — I5022 Chronic systolic (congestive) heart failure: Secondary | ICD-10-CM

## 2015-10-07 DIAGNOSIS — I429 Cardiomyopathy, unspecified: Secondary | ICD-10-CM | POA: Insufficient documentation

## 2015-10-07 DIAGNOSIS — I351 Nonrheumatic aortic (valve) insufficiency: Secondary | ICD-10-CM | POA: Diagnosis not present

## 2015-10-07 DIAGNOSIS — R42 Dizziness and giddiness: Secondary | ICD-10-CM | POA: Diagnosis not present

## 2015-10-07 DIAGNOSIS — R001 Bradycardia, unspecified: Secondary | ICD-10-CM | POA: Diagnosis not present

## 2015-10-08 ENCOUNTER — Telehealth: Payer: Self-pay | Admitting: *Deleted

## 2015-10-08 NOTE — Telephone Encounter (Signed)
-----   Message from Patsey Berthold, NP sent at 10/07/2015  6:19 PM EDT ----- Please notify patient of stable echo. Thanks!

## 2015-10-08 NOTE — Telephone Encounter (Signed)
SPOKE TO PT ABOUT RESULTS AND VERBALIZED UNDERSTANDING  

## 2015-11-02 ENCOUNTER — Other Ambulatory Visit: Payer: Self-pay | Admitting: Medical

## 2015-11-03 ENCOUNTER — Ambulatory Visit (INDEPENDENT_AMBULATORY_CARE_PROVIDER_SITE_OTHER): Payer: Medicare Other

## 2015-11-03 DIAGNOSIS — I5022 Chronic systolic (congestive) heart failure: Secondary | ICD-10-CM

## 2015-11-03 DIAGNOSIS — Z95810 Presence of automatic (implantable) cardiac defibrillator: Secondary | ICD-10-CM

## 2015-11-04 ENCOUNTER — Telehealth: Payer: Self-pay

## 2015-11-04 NOTE — Progress Notes (Signed)
EPIC Encounter for ICM Monitoring  Patient Name: Phillip Booth is a 65 y.o. male Date: 11/04/2015 Primary Booth Physican: Elise Benne Primary Cardiologist:Allred  Electrophysiologist: Allred Dry Weight:unknown Bi-V Pacing: >99%            Attempted ICM call and unable to reach.  Left detailed message regarding transmission.  Transmission reviewed.   Thoracic impedance normal   Follow-up plan: ICM clinic phone appointment on 12/09/2015.  Copy of ICM check sent to device physician.   ICM trend: 11/03/2015       Rosalene Billings, RN 11/04/2015 3:22 PM

## 2015-11-04 NOTE — Telephone Encounter (Signed)
Remote ICM transmission received.  Attempted patient call and left detailed message regarding transmission and next ICM scheduled for 12/09/2015.  Advised to return call for any fluid symptoms or questions.

## 2015-11-05 ENCOUNTER — Telehealth: Payer: Self-pay | Admitting: Medical

## 2015-11-05 MED ORDER — CARVEDILOL 6.25 MG PO TABS: ORAL_TABLET | ORAL | 0 refills | Status: DC

## 2015-11-05 MED ORDER — LISINOPRIL 20 MG PO TABS: 20.0000 mg | ORAL_TABLET | Freq: Every day | ORAL | 0 refills | Status: DC

## 2015-11-05 NOTE — Telephone Encounter (Signed)
Left message for pt to call back and set up a follow up appointment for a blood pressure check within a month. A 30 day supply was sent to [facility] on [address].

## 2015-11-05 NOTE — Telephone Encounter (Signed)
[id number] - [place], [place] - [address] AT [phone] (Phone) [phone] (Fax)     Reason for call:  Pharmacy requesting a refill lisinopril (PRINIVIL,ZESTRIL) 20 MG tablet and carvedilol (COREG) 6.25 MG tablet

## 2015-11-08 NOTE — Telephone Encounter (Signed)
Called and LVM informing patient that doctor would like him to call and schedule an appointment to have his BP check within a month.

## 2015-11-10 NOTE — Telephone Encounter (Signed)
Patient scheduled for 12/08/2015 °

## 2015-12-08 ENCOUNTER — Ambulatory Visit (INDEPENDENT_AMBULATORY_CARE_PROVIDER_SITE_OTHER): Payer: Medicare Other | Admitting: Medical

## 2015-12-08 ENCOUNTER — Encounter: Payer: Self-pay | Admitting: Medical

## 2015-12-08 VITALS — BP 130/80 | HR 73 | Temp 98.1°F | Ht 76.0 in | Wt 219.2 lb

## 2015-12-08 DIAGNOSIS — E785 Hyperlipidemia, unspecified: Secondary | ICD-10-CM | POA: Diagnosis not present

## 2015-12-08 DIAGNOSIS — J3489 Other specified disorders of nose and nasal sinuses: Secondary | ICD-10-CM | POA: Diagnosis not present

## 2015-12-08 DIAGNOSIS — R0981 Nasal congestion: Secondary | ICD-10-CM

## 2015-12-08 DIAGNOSIS — I10 Essential (primary) hypertension: Secondary | ICD-10-CM | POA: Diagnosis not present

## 2015-12-08 MED ORDER — LISINOPRIL 20 MG PO TABS: 20.0000 mg | ORAL_TABLET | Freq: Every day | ORAL | 3 refills | Status: DC

## 2015-12-08 MED ORDER — DOXYCYCLINE HYCLATE 100 MG PO TABS: 100.0000 mg | ORAL_TABLET | Freq: Two times a day (BID) | ORAL | 0 refills | Status: DC

## 2015-12-08 MED ORDER — AZELASTINE HCL 0.1 % NA SOLN: 2.0000 | Freq: Two times a day (BID) | NASAL | 3 refills | Status: DC

## 2015-12-08 NOTE — Patient Instructions (Addendum)
For you htn continue current bp med regimen. Will refill your lisinopril  For your high cholesterol will put in future labs.  For nasal congestion will rx astelin. Can continue the flonase.  If sinus pressure persists/worsens then start doxycycline.  For ED I want you to get rx of viagra from cardiologist if he thinks it is safe to use with your cardiac history.  Follow up in 3 months or as needed

## 2015-12-08 NOTE — Progress Notes (Addendum)
Subjective:    Patient ID: Phillip Booth, male    DOB: 06-28-1950, 65 y.o.   MRN: [id number]  HPI  Pt in for bp check. Bp is 130/80. No cardiac or neurologic signs or symptoms.  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  Pt has some nasal congestion. 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 In the past pt had used flonase spray but he has sporadic use. Pt has some sinus pressure for last 5 days.     Review of Systems  Constitutional: Negative for chills, fatigue and fever.  HENT: Positive for congestion and sinus pressure. Negative for drooling, ear discharge, ear pain, nosebleeds, sinus pain, sneezing and tinnitus.   Respiratory: Negative for cough, chest tightness, shortness of breath and wheezing.   Cardiovascular: Negative for chest pain and palpitations.  Gastrointestinal: Negative for abdominal pain.  Genitourinary: Negative for frequency.  Musculoskeletal: Negative for back pain.  Skin: Negative for rash.  Neurological: Negative for dizziness, seizures, speech difficulty, weakness, numbness and headaches.  Hematological: Negative for adenopathy. Does not bruise/bleed easily.  Psychiatric/Behavioral: Negative for agitation, confusion and hallucinations.    Past Medical History:  Diagnosis Date  . Allergy   . CAD (coronary artery disease)    a. moderate by cath 03/2014  . Cataract   . CVA (cerebral infarction)    a. diagnosis not clear  . Hyperlipidemia   . Hypertension   . Non-ischemic cardiomyopathy ([place])    a. EF 45% by echo 03/2014  . Paroxysmal atrial fibrillation ([place]) 05/01/2014   asymptomatic, documented on PPM interrogation,  chads2vasc score is at least 6.  He is contemplating anticoagulation  . Symptomatic bradycardia    a. s/p STJ CRTP implanted by Dr Rayann Heman  . Vertigo      Social History   Social History  . Marital status: Married    Spouse name: N/A  . Number of children: N/A  . Years of  education: N/A   Occupational History  . Not on file.   Social History Main Topics  . Smoking status: Never Smoker  . Smokeless tobacco: Never Used  . Alcohol use Yes     Comment: occasional  . Drug use: No  . Sexual activity: Yes   Other Topics Concern  . Not on file   Social History Narrative  . No narrative on file    Past Surgical History:  Procedure Laterality Date  . ABDOMINAL SURGERY    . APPENDECTOMY    . BI-VENTRICULAR PACEMAKER INSERTION N/A 03/19/2014   STJ CRTP implanted by Dr Rayann Heman  . HERNIA REPAIR    . LEFT HEART CATHETERIZATION WITH CORONARY ANGIOGRAM N/A 03/19/2014   Procedure: LEFT HEART CATHETERIZATION WITH CORONARY ANGIOGRAM;  Surgeon: [name], MD;  Location: [facility] CATH LAB;  Service: Cardiovascular;  Laterality: N/A;    Family History  Problem Relation Age of Onset  . Hypertension Mother   . Cancer Mother     brain (possibly started in lung)  . Heart disease Mother   . Diabetes Mother   . Other Mother     cardiomegaly  . Hypertension Father   . Emphysema Father   . Heart Problems Brother     pacemaker  . Heart Problems Other     "using 10% of heart"    No Known Allergies  Current Outpatient Prescriptions on File Prior to Visit  Medication Sig Dispense Refill  .  amLODipine (NORVASC) 10 MG tablet TAKE 1 TABLET (10 MG TOTAL) BY MOUTH DAILY. 90 tablet 1  . aspirin 81 MG tablet Take 81 mg by mouth daily.     . carvedilol (COREG) 6.25 MG tablet TAKE 1 TABLET (6.25 MG TOTAL) BY MOUTH 2 (TWO) TIMES DAILY WITH A MEAL. 60 tablet 0  . fluticasone (FLONASE) 50 MCG/ACT nasal spray PLACE 2 SPRAYS INTO BOTH NOSTRILS DAILY. 16 g 2  . furosemide (LASIX) 20 MG tablet Take 1 tablet (20 mg total) by mouth daily. 90 tablet 3  . lisinopril (PRINIVIL,ZESTRIL) 20 MG tablet Take 1 tablet (20 mg total) by mouth daily. 30 tablet 0  . omeprazole (PRILOSEC) 20 MG capsule Take 1 capsule (20 mg total) by mouth daily. 30 capsule 3  . rosuvastatin (CRESTOR) 10 MG  tablet TAKE 1 TABLET (10 MG TOTAL) BY MOUTH DAILY. 30 tablet 3  . sildenafil (VIAGRA) 25 MG tablet Take 1 tablet (25 mg total) by mouth daily as needed for erectile dysfunction (1 tab po as needed prior to sex. max 1 tab po q day). 10 tablet 0  . tamsulosin (FLOMAX) 0.4 MG CAPS capsule TAKE 1 CAPSULE (0.4 MG TOTAL) BY MOUTH DAILY. 90 capsule 1  . VENTOLIN HFA 108 (90 Base) MCG/ACT inhaler INHALE 2 PUFFS INTO THE LUNGS EVERY 6 (SIX) HOURS AS NEEDED FOR WHEEZING OR SHORTNESS OF BREATH. 18 Inhaler 0   No current facility-administered medications on file prior to visit.     BP 130/80 (BP Location: Left Arm, Patient Position: Sitting, Cuff Size: Large)   Pulse 73   Temp 98.1 F (36.7 C) (Oral)   Ht 6\' 4"  (1.93 m)   Wt 219 lb 3.2 oz (99.4 kg)   SpO2 98%   BMI 26.68 kg/m       Objective:   Physical Exam   General  Mental Status - Alert. General Appearance - Well groomed. Not in acute distress.  Skin Rashes- No Rashes.  HEENT Head- Normal. Ear Auditory Canal - Left- Normal. Right - Normal.Tympanic Membrane- Left- Normal. Right- Normal. Eye Sclera/Conjunctiva- Left- Normal. Right- Normal. Nose & Sinuses Nasal Mucosa- Left-  Boggy and Congested. Right-  Boggy and  Congested.Bilateral faint  maxillary and faint frontal sinus pressure.  Mouth & Throat Lips: Upper Lip- Normal: no dryness, cracking, pallor, cyanosis, or vesicular eruption. Lower Lip-Normal: no dryness, cracking, pallor, cyanosis or vesicular eruption. Buccal Mucosa- Bilateral- No Aphthous ulcers. Oropharynx- No Discharge or Erythema. Tonsils: Characteristics- Bilateral- No Erythema or Congestion. Size/Enlargement- Bilateral- No enlargement. Discharge- bilateral-None.  Neck Neck- Supple. No Masses.   Chest and Lung Exam Auscultation: Breath Sounds:-Clear even and unlabored.  Cardiovascular Auscultation:Rythm- Regular, rate and rhythm. Murmurs & Other Heart Sounds:Ausculatation of the heart reveal- No  Murmurs.  Lymphatic Head & Neck General Head & Neck Lymphatics: Bilateral: Description- No Localized lymphadenopathy.    Neurologic Cranial Nerve exam:- CN III-XII intact(No nystagmus), symmetric smile. Strength:- 5/5 equal and symmetric strength both upper and lower extremities.      Assessment & Plan:  For you htn continue current bp med regimen. Will refill your lisinopril  For your high cholesterol will put in future labs.  For nasal congestion will rx astelin. Can continue the flonase.  If sinus pressure persists/worsens then start doxycycline.  For ED I want you to get rx of viagra from cardiologist if he thinks it is safe to use with your cardiac history.  Follow up in 3 months or as needed  Grettell Ransdell, Percell Miller, [employer]

## 2015-12-08 NOTE — Progress Notes (Signed)
Pre visit review using our clinic review tool, if applicable. No additional management support is needed unless otherwise documented below in the visit note. 

## 2015-12-09 ENCOUNTER — Ambulatory Visit (INDEPENDENT_AMBULATORY_CARE_PROVIDER_SITE_OTHER): Payer: Medicare Other

## 2015-12-09 DIAGNOSIS — I5022 Chronic systolic (congestive) heart failure: Secondary | ICD-10-CM

## 2015-12-09 DIAGNOSIS — Z95810 Presence of automatic (implantable) cardiac defibrillator: Secondary | ICD-10-CM

## 2015-12-10 ENCOUNTER — Telehealth: Payer: Self-pay

## 2015-12-10 NOTE — Progress Notes (Signed)
EPIC Encounter for ICM Monitoring  Patient Name: Phillip Booth is a 65 y.o. male Date: 12/10/2015 Primary Care Physican: Elise Benne Primary Cardiologist:Allred  Electrophysiologist: Allred Dry Weight:unknown Bi-V Pacing: >99%       Heart Failure questions reviewed, pt asymptomatic   Thoracic impedance normal   Recommendations: No changes.  Reinforced low salt food choices and limiting fluid intake to < 2 liters per day. Encouraged to call for fluid symptoms.    Follow-up plan: ICM clinic phone appointment on 01/17/2016.  Advised he is due to make a December appointment with Chanetta Marshall, NP.   Copy of ICM check sent to device physician.   ICM trend: 12/09/2015       Rosalene Billings, RN 12/10/2015 8:04 AM

## 2015-12-10 NOTE — Telephone Encounter (Signed)
ICM call to patient.  He wanted to confirm with Dr Rayann Heman that it is safe to take Viagra and is getting the medication from his physician.  Advised Viagra has been on his list of prescriptions since 08/2014 so it is not a new medication for him but would send to Dr Jackalyn Lombard nurse for follow up.

## 2015-12-13 ENCOUNTER — Other Ambulatory Visit: Payer: Self-pay | Admitting: Medical

## 2015-12-13 ENCOUNTER — Other Ambulatory Visit: Payer: Self-pay | Admitting: Nurse Practitioner

## 2015-12-13 NOTE — Telephone Encounter (Signed)
Discussed with Dr Rayann Heman and it's okay to take.  Left message for patient with this information

## 2015-12-23 ENCOUNTER — Ambulatory Visit (INDEPENDENT_AMBULATORY_CARE_PROVIDER_SITE_OTHER): Payer: Medicare Other | Admitting: *Deleted

## 2015-12-23 DIAGNOSIS — I441 Atrioventricular block, second degree: Secondary | ICD-10-CM | POA: Diagnosis not present

## 2015-12-23 NOTE — Progress Notes (Signed)
Remote pacemaker transmission.   

## 2015-12-29 ENCOUNTER — Encounter: Payer: Self-pay | Admitting: Cardiology

## 2016-01-05 ENCOUNTER — Telehealth: Payer: Self-pay | Admitting: *Deleted

## 2016-01-05 NOTE — Telephone Encounter (Signed)
Scheduled appt 01/07/16 @3 

## 2016-01-07 ENCOUNTER — Ambulatory Visit (INDEPENDENT_AMBULATORY_CARE_PROVIDER_SITE_OTHER): Payer: Medicare Other | Admitting: *Deleted

## 2016-01-07 ENCOUNTER — Encounter: Payer: Self-pay | Admitting: *Deleted

## 2016-01-07 VITALS — BP 120/78 | HR 60 | Ht 76.0 in | Wt 221.4 lb

## 2016-01-07 DIAGNOSIS — Z1211 Encounter for screening for malignant neoplasm of colon: Secondary | ICD-10-CM | POA: Diagnosis not present

## 2016-01-07 DIAGNOSIS — Z0000 Encounter for general adult medical examination without abnormal findings: Secondary | ICD-10-CM | POA: Diagnosis not present

## 2016-01-07 NOTE — Progress Notes (Addendum)
Pre visit review using our clinic review tool, if applicable. No additional management support is needed unless otherwise documented below in the visit note.  I have reviewed RN medicare wellness exam. Agree with assessment and plan. Apears chart review vaccines were declined. Plan to discuss benefits vs risk of those again when pt in for next visit.  [place], Percell Miller, PA-C

## 2016-01-07 NOTE — Patient Instructions (Signed)
Continue to eat heart healthy diet (full of fruits, vegetables, whole grains, lean protein, water--limit salt, fat, and sugar intake) and increase physical activity as tolerated. Bring a copy of your advance directives to your next office visit.   Heart-Healthy Eating Plan Introduction Heart-healthy meal planning includes:  Limiting unhealthy fats.  Increasing healthy fats.  Making other small dietary changes. You may need to talk with your doctor or a diet specialist (dietitian) to create an eating plan that is right for you. What types of fat should I choose?  Choose healthy fats. These include olive oil and canola oil, flaxseeds, walnuts, almonds, and seeds.  Eat more omega-3 fats. These include salmon, mackerel, sardines, tuna, flaxseed oil, and ground flaxseeds. Try to eat fish at least twice each week.  Limit saturated fats.  Saturated fats are often found in animal products, such as meats, butter, and cream.  Plant sources of saturated fats include palm oil, palm kernel oil, and coconut oil.  Avoid foods with partially hydrogenated oils in them. These include stick margarine, some tub margarines, cookies, crackers, and other baked goods. These contain trans fats. What general guidelines do I need to follow?  Check food labels carefully. Identify foods with trans fats or high amounts of saturated fat.  Fill one half of your plate with vegetables and green salads. Eat 4-5 servings of vegetables per day. A serving of vegetables is:  1 cup of raw leafy vegetables.   cup of raw or cooked cut-up vegetables.   cup of vegetable juice.  Fill one fourth of your plate with whole grains. Look for the word "whole" as the first word in the ingredient list.  Fill one fourth of your plate with lean protein foods.  Eat 4-5 servings of fruit per day. A serving of fruit is:  One medium whole fruit.   cup of dried fruit.   cup of fresh, frozen, or canned fruit.   cup of  100% fruit juice.  Eat more foods that contain soluble fiber. These include apples, broccoli, carrots, beans, peas, and barley. Try to get 20-30 g of fiber per day.  Eat more home-cooked food. Eat less restaurant, buffet, and fast food.  Limit or avoid alcohol.  Limit foods high in starch and sugar.  Avoid fried foods.  Avoid frying your food. Try baking, boiling, grilling, or broiling it instead. You can also reduce fat by:  Removing the skin from poultry.  Removing all visible fats from meats.  Skimming the fat off of stews, soups, and gravies before serving them.  Steaming vegetables in water or broth.  Lose weight if you are overweight.  Eat 4-5 servings of nuts, legumes, and seeds per week:  One serving of dried beans or legumes equals  cup after being cooked.  One serving of nuts equals 1 ounces.  One serving of seeds equals  ounce or one tablespoon.  You may need to keep track of how much salt or sodium you eat. This is especially true if you have high blood pressure. Talk with your doctor or dietitian to get more information. What foods can I eat? Grains  Breads, including [place], white, pita, wheat, raisin, rye, oatmeal, and [place]. Tortillas that are neither fried nor made with lard or trans fat. Low-fat rolls, including hotdog and hamburger buns and English muffins. Biscuits. Muffins. Waffles. Pancakes. Light popcorn. Whole-grain cereals. Flatbread. Melba toast. Pretzels. Breadsticks. Rusks. Low-fat snacks. Low-fat crackers, including oyster, saltine, matzo, graham, animal, and rye. Rice and pasta,  including brown rice and pastas that are made with whole wheat. Vegetables  All vegetables. Fruits  All fruits, but limit coconut. Meats and Other Protein Sources  Lean, well-trimmed beef, veal, pork, and lamb. Chicken and [place] without skin. All fish and shellfish. Wild duck, rabbit, pheasant, and venison. Egg whites or low-cholesterol egg substitutes. Dried beans,  peas, lentils, and tofu. Seeds and most nuts. Dairy  Low-fat or nonfat cheeses, including ricotta, string, and mozzarella. Skim or 1% milk that is liquid, powdered, or evaporated. Buttermilk that is made with low-fat milk. Nonfat or low-fat yogurt. Beverages  Mineral water. Diet carbonated beverages. Sweets and Desserts  Sherbets and fruit ices. Honey, jam, marmalade, jelly, and syrups. Meringues and gelatins. Pure sugar candy, such as hard candy, jelly beans, gumdrops, mints, marshmallows, and small amounts of dark chocolate. [employer] Eat all sweets and desserts in moderation. Fats and Oils  Nonhydrogenated (trans-free) margarines. Vegetable oils, including soybean, sesame, sunflower, olive, peanut, safflower, corn, canola, and cottonseed. Salad dressings or mayonnaise made with a vegetable oil. Limit added fats and oils that you use for cooking, baking, salads, and as spreads. Other  Cocoa powder. Coffee and tea. All seasonings and condiments. The items listed above may not be a complete list of recommended foods or beverages. Contact your dietitian for more options.  What foods are not recommended? Grains  Breads that are made with saturated or trans fats, oils, or whole milk. Croissants. Butter rolls. Cheese breads. Sweet rolls. Donuts. Buttered popcorn. Chow mein noodles. High-fat crackers, such as cheese or butter crackers. Meats and Other Protein Sources  Fatty meats, such as hotdogs, short ribs, sausage, spareribs, bacon, rib eye roast or steak, and mutton. High-fat deli meats, such as salami and bologna. Caviar. Domestic duck and goose. Organ meats, such as kidney, liver, sweetbreads, and heart. Dairy  Cream, sour cream, cream cheese, and creamed cottage cheese. Whole-milk cheeses, including blue (bleu), Monterey Jack, [place], [place], American, [place], Swiss, cheddar, [place], and [place]. Whole or 2% milk that is liquid, evaporated, or condensed. Whole buttermilk. Cream sauce or  high-fat cheese sauce. Yogurt that is made from whole milk. Beverages  Regular sodas and juice drinks with added sugar. Sweets and Desserts  Frosting. Pudding. Cookies. Cakes other than angel food cake. Candy that has milk chocolate or white chocolate, hydrogenated fat, butter, coconut, or unknown ingredients. Buttered syrups. Full-fat ice cream or ice cream drinks. Fats and Oils  Gravy that has suet, meat fat, or shortening. Cocoa butter, hydrogenated oils, palm oil, coconut oil, palm kernel oil. These can often be found in baked products, candy, fried foods, nondairy creamers, and whipped toppings. Solid fats and shortenings, including bacon fat, salt pork, lard, and butter. Nondairy cream substitutes, such as coffee creamers and sour cream substitutes. Salad dressings that are made of unknown oils, cheese, or sour cream. The items listed above may not be a complete list of foods and beverages to avoid. Contact your dietitian for more information.  This information is not intended to replace advice given to you by your health care provider. Make sure you discuss any questions you have with your health care provider. Document Released: 06/27/2011 Document Revised: 06/03/2015 Document Reviewed: 06/19/2013  2017 Elsevier

## 2016-01-07 NOTE — Progress Notes (Signed)
Subjective:   Phillip Booth is a 65 y.o. male who presents for an Initial Medicare Annual Wellness Visit.  Review of Systems  No ROS.  Medicare Wellness Visit.  Cardiac Risk Factors include: advanced age (>3men, >48 women);hypertension;male gender;sedentary lifestyle Sleep patterns: Sleeps well. Feels rested. Home Safety/Smoke Alarms:  Smoke alarms in place.  Living environment; residence and Firearm Safety: Lives with wife in apartment. No guns. Feels safe.  Seat Belt Safety/Bike Helmet: Wears seatbelt.   Counseling:   Eye Exam- Dr.Beavis as needed. Also uses [facility].   Male:   CCS-  Ordered today. PSA-  Lab Results  Component Value Date   PSA 0.78 04/14/2014       Objective:    Today's Vitals   01/07/16 1538  BP: 120/78  Pulse: 60  SpO2: 98%  Weight: 221 lb 6.4 oz (100.4 kg)  Height: 6\' 4"  (1.93 m)   Body mass index is 26.95 kg/m.  Current Medications (verified) Outpatient Encounter Prescriptions as of 01/07/2016  Medication Sig  . amLODipine (NORVASC) 10 MG tablet TAKE 1 TABLET (10 MG TOTAL) BY MOUTH DAILY.  Marland Kitchen aspirin 81 MG tablet Take 81 mg by mouth daily.   Marland Kitchen azelastine (ASTELIN) 0.1 % nasal spray Place 2 sprays into both nostrils 2 (two) times daily. Use in each nostril as directed  . carvedilol (COREG) 6.25 MG tablet TAKE 1 TABLET (6.25 MG TOTAL) BY MOUTH 2 (TWO) TIMES DAILY WITH A MEAL.  . fluticasone (FLONASE) 50 MCG/ACT nasal spray PLACE 2 SPRAYS INTO BOTH NOSTRILS DAILY.  . furosemide (LASIX) 20 MG tablet TAKE 1 TABLET (20 MG TOTAL) BY MOUTH DAILY.  Marland Kitchen lisinopril (PRINIVIL,ZESTRIL) 20 MG tablet Take 1 tablet (20 mg total) by mouth daily.  . rosuvastatin (CRESTOR) 10 MG tablet TAKE 1 TABLET (10 MG TOTAL) BY MOUTH DAILY.  . tamsulosin (FLOMAX) 0.4 MG CAPS capsule TAKE 1 CAPSULE (0.4 MG TOTAL) BY MOUTH DAILY.  . VENTOLIN HFA 108 (90 Base) MCG/ACT inhaler INHALE 2 PUFFS INTO THE LUNGS EVERY 6 (SIX) HOURS AS NEEDED FOR WHEEZING OR SHORTNESS OF  BREATH.  Marland Kitchen lisinopril (PRINIVIL,ZESTRIL) 20 MG tablet Take 1 tablet (20 mg total) by mouth daily.  Marland Kitchen omeprazole (PRILOSEC) 20 MG capsule Take 1 capsule (20 mg total) by mouth daily. (Patient not taking: Reported on 01/07/2016)  . sildenafil (VIAGRA) 25 MG tablet Take 1 tablet (25 mg total) by mouth daily as needed for erectile dysfunction (1 tab po as needed prior to sex. max 1 tab po q day). (Patient not taking: Reported on 01/07/2016)  . [DISCONTINUED] doxycycline (VIBRA-TABS) 100 MG tablet Take 1 tablet (100 mg total) by mouth 2 (two) times daily. Can give caps or generic   No facility-administered encounter medications on file as of 01/07/2016.     Allergies (verified) Patient has no known allergies.   History: Past Medical History:  Diagnosis Date  . Allergy   . CAD (coronary artery disease)    a. moderate by cath 03/2014  . Cataract   . CVA (cerebral infarction)    a. diagnosis not clear  . Hyperlipidemia   . Hypertension   . Non-ischemic cardiomyopathy ([place])    a. EF 45% by echo 03/2014  . Paroxysmal atrial fibrillation ([place]) 05/01/2014   asymptomatic, documented on PPM interrogation,  chads2vasc score is at least 6.  He is contemplating anticoagulation  . Symptomatic bradycardia    a. s/p STJ CRTP implanted by Dr Rayann Heman  . Vertigo    Past Surgical  History:  Procedure Laterality Date  . ABDOMINAL SURGERY    . APPENDECTOMY    . BI-VENTRICULAR PACEMAKER INSERTION N/A 03/19/2014   STJ CRTP implanted by Dr Rayann Heman  . EYE SURGERY     Cataract removed from Right eye  . HERNIA REPAIR    . LEFT HEART CATHETERIZATION WITH CORONARY ANGIOGRAM N/A 03/19/2014   Procedure: LEFT HEART CATHETERIZATION WITH CORONARY ANGIOGRAM;  Surgeon: [name], MD;  Location: [facility] CATH LAB;  Service: Cardiovascular;  Laterality: N/A;   Family History  Problem Relation Age of Onset  . Hypertension Mother   . Cancer Mother     brain (possibly started in lung)  . Heart disease Mother   . Diabetes  Mother   . Other Mother     cardiomegaly  . Hypertension Father   . Emphysema Father   . Heart Problems Brother     pacemaker  . Heart Problems Other     "using 10% of heart"   Social History   Occupational History  . Not on file.   Social History Main Topics  . Smoking status: Never Smoker  . Smokeless tobacco: Never Used  . Alcohol use Yes     Comment: occasional  . Drug use: No  . Sexual activity: Yes   Tobacco Counseling Counseling given: Not Answered   Activities of Daily Living In your present state of health, do you have any difficulty performing the following activities: 01/07/2016 12/08/2015  Hearing? N N  Vision? N N  Difficulty concentrating or making decisions? N N  Walking or climbing stairs? N N  Dressing or bathing? N N  Doing errands, shopping? N N  Preparing Food and eating ? N -  Using the Toilet? N -  In the past six months, have you accidently leaked urine? N -  Do you have problems with loss of bowel control? N -  Managing your Medications? N -  Managing your Finances? N -  Housekeeping or managing your Housekeeping? N -  Some recent data might be hidden    Immunizations and Health Maintenance Immunization History  Administered Date(s) Administered  . Influenza,inj,Quad PF,36+ Mos 11/09/2014  . Influenza-Unspecified 10/13/2013  . Pneumococcal Polysaccharide-23 03/20/2014   Health Maintenance Due  Topic Date Due  . Hepatitis C Screening  1950/09/24  . COLONOSCOPY  07/26/1998  . ZOSTAVAX  07/25/2008  . PNA vac Low Risk Adult (2 of 2 - PCV13) 03/20/2015    Patient Care Team: [name], PA-C as PCP - General (Physician Assistant) Patsey Berthold, NP as Nurse Practitioner (Cardiology)  Indicate any recent Medical Services you may have received from other than [facility] providers in the past year (date may be approximate).    Assessment:   This is a routine wellness examination for [facility]. Physical assessment deferred to  PCP.   Hearing/Vision screen Hearing Screening Comments: Pt does not have time today. Able to hear conversational tones w/o difficulty. No issues reported.   Vision Screening Comments: Pt does not have time today.  Dietary issues and exercise activities discussed: Current Exercise Habits: The patient does not participate in regular exercise at present Diet: pt states he eats pretty healthy at home but is on the go a lot. Provided education on heart healthy diet and encouraged pt to pack healthy meals for on the go.  Goals    . Do treadmill x22min and strength training 3x/week.    . Get off of some medications by improving diet and exercise.  Depression Screen PHQ 2/9 Scores 01/07/2016 12/08/2015 11/09/2014  PHQ - 2 Score 0 0 0    Fall Risk Fall Risk  01/07/2016 12/08/2015 11/09/2014  Falls in the past year? No No No    Cognitive Function: MMSE - Mini Mental State Exam 01/07/2016  Orientation to time 5  Orientation to Place 5  Registration 3  Attention/ Calculation 5  Recall 3  Language- name 2 objects 2  Language- repeat 1  Language- follow 3 step command 3  Language- read & follow direction 1  Write a sentence 1  Copy design 1  Total score 30        Screening Tests Health Maintenance  Topic Date Due  . Hepatitis C Screening  12-04-1950  . COLONOSCOPY  07/26/1998  . ZOSTAVAX  07/25/2008  . PNA vac Low Risk Adult (2 of 2 - PCV13) 03/20/2015  . INFLUENZA VACCINE  04/08/2016 (Originally 08/10/2015)  . TETANUS/TDAP  01/09/2017        Plan:   Continue to eat heart healthy diet (full of fruits, vegetables, whole grains, lean protein, water--limit salt, fat, and sugar intake) and increase physical activity as tolerated. Pack healthy meals for long driving. Consider stocking for long drives. Bring a copy of your advance directives to your next office visit.   During the course of the visit Sanel was educated and counseled about the following appropriate  screening and preventive services:   Vaccines to include Pneumoccal, Influenza, Hepatitis B, Td, Zostavax, HCV  Colorectal cancer screening  Cardiovascular disease screening  Diabetes screening  Glaucoma screening  Nutrition counseling  Prostate cancer screening  Patient Instructions (the written plan) were given to the patient.   Shela Nevin, [place]   01/07/2016

## 2016-01-08 ENCOUNTER — Other Ambulatory Visit: Payer: Self-pay | Admitting: Medical

## 2016-01-14 LAB — CUP PACEART REMOTE DEVICE CHECK
Battery Voltage: 2.98 V
Brady Statistic AP VP Percent: 96 %
Brady Statistic AP VS Percent: 1 %
Brady Statistic AS VP Percent: 4.4 %
Brady Statistic AS VS Percent: 1 %
Brady Statistic RA Percent Paced: 95 %
Implantable Lead Implant Date: 20160310
Implantable Lead Location: 753858
Implantable Lead Location: 753859
Lead Channel Impedance Value: 560 Ohm
Lead Channel Impedance Value: 740 Ohm
Lead Channel Pacing Threshold Amplitude: 0.75 V
Lead Channel Pacing Threshold Amplitude: 1 V
Lead Channel Pacing Threshold Amplitude: 1.25 V
Lead Channel Pacing Threshold Pulse Width: 0.5 ms
Lead Channel Sensing Intrinsic Amplitude: 12 mV
Lead Channel Setting Pacing Amplitude: 2 V
Lead Channel Setting Pacing Amplitude: 2.5 V
Lead Channel Setting Pacing Pulse Width: 0.5 ms
Lead Channel Setting Sensing Sensitivity: 2 mV
MDC IDC LEAD IMPLANT DT: 20160310
MDC IDC LEAD IMPLANT DT: 20160310
MDC IDC LEAD LOCATION: 753860
MDC IDC LEAD MODEL: 1948
MDC IDC MSMT BATTERY REMAINING LONGEVITY: 87 mo
MDC IDC MSMT BATTERY REMAINING PERCENTAGE: 95.5 %
MDC IDC MSMT LEADCHNL LV PACING THRESHOLD PULSEWIDTH: 0.5 ms
MDC IDC MSMT LEADCHNL RA IMPEDANCE VALUE: 450 Ohm
MDC IDC MSMT LEADCHNL RA SENSING INTR AMPL: 2 mV
MDC IDC MSMT LEADCHNL RV PACING THRESHOLD PULSEWIDTH: 0.5 ms
MDC IDC PG IMPLANT DT: 20160310
MDC IDC PG MODEL: 3242
MDC IDC PG SERIAL: 7724805
MDC IDC SESS DTM: 20171214073312
MDC IDC SET LEADCHNL RV PACING AMPLITUDE: 2.5 V
MDC IDC SET LEADCHNL RV PACING PULSEWIDTH: 0.5 ms

## 2016-01-17 ENCOUNTER — Ambulatory Visit (INDEPENDENT_AMBULATORY_CARE_PROVIDER_SITE_OTHER): Payer: Medicare Other

## 2016-01-17 ENCOUNTER — Telehealth: Payer: Self-pay | Admitting: Cardiology

## 2016-01-17 ENCOUNTER — Telehealth: Payer: Self-pay | Admitting: Internal Medicine

## 2016-01-17 DIAGNOSIS — Z95810 Presence of automatic (implantable) cardiac defibrillator: Secondary | ICD-10-CM | POA: Diagnosis not present

## 2016-01-17 DIAGNOSIS — I5022 Chronic systolic (congestive) heart failure: Secondary | ICD-10-CM

## 2016-01-17 NOTE — Progress Notes (Signed)
EPIC Encounter for ICM Monitoring  Patient Name: Phillip Booth is a 66 y.o. male Date: 01/17/2016 Primary Care Physican: Elise Benne Primary Cardiologist:Allred  Electrophysiologist: Allred Dry Weight:unknown Bi-V Pacing: >99%                                                            Heart Failure questions reviewed, pt asymptomatic   Thoracic impedance normal   Recommendations: No changes.  Reinforced to limit low salt food choices to 2000 mg day and limiting fluid intake to < 2 liters per day. Encouraged to call for fluid symptoms.    Follow-up plan: ICM clinic phone appointment on 03/21/2016.  Copy of ICM check sent to device physician.   3 month ICM trend : 01/17/2016   1 Year ICM trend:      Rosalene Billings, RN 01/17/2016 3:12 PM

## 2016-01-17 NOTE — Telephone Encounter (Signed)
I referred Mr. Garrod to Sharman Cheek, RN for ICM follow up. Scheduled with Chanetta Marshall, NP in February for overdue follow up.

## 2016-01-17 NOTE — Telephone Encounter (Signed)
°  New Prob  Pt has some questions regarding his device and sending his transmissions. Please call.

## 2016-01-17 NOTE — Telephone Encounter (Signed)
LMOVM reminding pt to send remote transmission.   

## 2016-01-21 ENCOUNTER — Encounter: Payer: Self-pay | Admitting: Cardiology

## 2016-01-21 ENCOUNTER — Encounter: Payer: Self-pay | Admitting: *Deleted

## 2016-01-21 ENCOUNTER — Telehealth: Payer: Self-pay

## 2016-01-21 ENCOUNTER — Ambulatory Visit (INDEPENDENT_AMBULATORY_CARE_PROVIDER_SITE_OTHER): Payer: Medicare Other | Admitting: Cardiology

## 2016-01-21 VITALS — BP 130/80 | HR 60 | Ht 76.0 in | Wt 220.4 lb

## 2016-01-21 DIAGNOSIS — I480 Paroxysmal atrial fibrillation: Secondary | ICD-10-CM | POA: Diagnosis not present

## 2016-01-21 DIAGNOSIS — I10 Essential (primary) hypertension: Secondary | ICD-10-CM | POA: Diagnosis not present

## 2016-01-21 DIAGNOSIS — Z950 Presence of cardiac pacemaker: Secondary | ICD-10-CM

## 2016-01-21 DIAGNOSIS — I441 Atrioventricular block, second degree: Secondary | ICD-10-CM

## 2016-01-21 NOTE — Progress Notes (Signed)
Cardiology Office Note    Date:  01/21/2016   ID:  Phillip Booth, DOB January 28, 1950, MRN [id number]  PCP:  [name], PA-C  Cardiologist:   Candee Furbish, MD     History of Present Illness:  Phillip Booth is a 66 y.o. male with pacemaker implantation secondary to second degree heart block in 2016, [employer]. Jude with nonischemic cardiomyopathy previously with most recent echocardiogram demonstrating normal ejection fraction here to discuss DOT qualifications.  No syncope, no shortness of breath, no chest pain.  Previously with diagnoses of paroxysmal atrial fibrillation but less than 1% burden noted on pacemaker in the past, likely atrial tachycardia, not atrial fibrillation.  Feels slight GERD at times. [place].  Mild dyspnea. Had allergies. Sinus drain.   Overall doing quite well. No major complaints.   Past Medical History:  Diagnosis Date  . Allergy   . CAD (coronary artery disease)    a. moderate by cath 03/2014  . Cataract   . CVA (cerebral infarction)    a. diagnosis not clear  . Hyperlipidemia   . Hypertension   . Non-ischemic cardiomyopathy ([place])    a. EF 45% by echo 03/2014  . Paroxysmal atrial fibrillation ([place]) 05/01/2014   asymptomatic, documented on PPM interrogation,  chads2vasc score is at least 6.  He is contemplating anticoagulation  . Symptomatic bradycardia    a. s/p STJ CRTP implanted by Dr Rayann Heman  . Vertigo     Past Surgical History:  Procedure Laterality Date  . ABDOMINAL SURGERY    . APPENDECTOMY    . BI-VENTRICULAR PACEMAKER INSERTION N/A 03/19/2014   STJ CRTP implanted by Dr Rayann Heman  . EYE SURGERY     Cataract removed from Right eye  . HERNIA REPAIR    . LEFT HEART CATHETERIZATION WITH CORONARY ANGIOGRAM N/A 03/19/2014   Procedure: LEFT HEART CATHETERIZATION WITH CORONARY ANGIOGRAM;  Surgeon: [name], MD;  Location: [facility] CATH LAB;  Service: Cardiovascular;  Laterality: N/A;    Current Medications: Outpatient Medications Prior to Visit    Medication Sig Dispense Refill  . amLODipine (NORVASC) 10 MG tablet TAKE 1 TABLET (10 MG TOTAL) BY MOUTH DAILY. 90 tablet 1  . aspirin 81 MG tablet Take 81 mg by mouth daily.     Marland Kitchen azelastine (ASTELIN) 0.1 % nasal spray Place 2 sprays into both nostrils 2 (two) times daily. Use in each nostril as directed 30 mL 3  . carvedilol (COREG) 6.25 MG tablet TAKE 1 TABLET (6.25 MG TOTAL) BY MOUTH 2 (TWO) TIMES DAILY WITH A MEAL. 60 tablet 0  . fluticasone (FLONASE) 50 MCG/ACT nasal spray PLACE 2 SPRAYS INTO BOTH NOSTRILS DAILY. 16 g 2  . furosemide (LASIX) 20 MG tablet TAKE 1 TABLET (20 MG TOTAL) BY MOUTH DAILY. 90 tablet 2  . lisinopril (PRINIVIL,ZESTRIL) 20 MG tablet Take 1 tablet (20 mg total) by mouth daily. 30 tablet 0  . lisinopril (PRINIVIL,ZESTRIL) 20 MG tablet Take 1 tablet (20 mg total) by mouth daily. 90 tablet 3  . omeprazole (PRILOSEC) 20 MG capsule Take 1 capsule (20 mg total) by mouth daily. (Patient not taking: Reported on 01/07/2016) 30 capsule 3  . rosuvastatin (CRESTOR) 10 MG tablet TAKE 1 TABLET (10 MG TOTAL) BY MOUTH DAILY. 30 tablet 3  . sildenafil (VIAGRA) 25 MG tablet Take 1 tablet (25 mg total) by mouth daily as needed for erectile dysfunction (1 tab po as needed prior to sex. max 1 tab po q day). (Patient not taking: Reported on 01/07/2016)  10 tablet 0  . tamsulosin (FLOMAX) 0.4 MG CAPS capsule TAKE 1 CAPSULE (0.4 MG TOTAL) BY MOUTH DAILY. 90 capsule 1  . VENTOLIN HFA 108 (90 Base) MCG/ACT inhaler INHALE 2 PUFFS INTO THE LUNGS EVERY 6 (SIX) HOURS AS NEEDED FOR WHEEZING OR SHORTNESS OF BREATH. 18 Inhaler 0   No facility-administered medications prior to visit.      Allergies:   Patient has no known allergies.   Social History   Social History  . Marital status: Married    Spouse name: N/A  . Number of children: N/A  . Years of education: N/A   Social History Main Topics  . Smoking status: Never Smoker  . Smokeless tobacco: Never Used  . Alcohol use Yes     Comment:  occasional  . Drug use: No  . Sexual activity: Yes   Other Topics Concern  . None   Social History Narrative  . None     Family History:  The patient's family history includes Cancer in his mother; Diabetes in his mother; Emphysema in his father; Heart Problems in his brother and other; Heart disease in his mother; Hypertension in his father and mother; Other in his mother.   ROS:   Please see the history of present illness.    ROS All other systems reviewed and are negative.   PHYSICAL EXAM:   VS:  BP 130/80   Pulse 60   Ht 6\' 4"  (1.93 m)   Wt 220 lb 6.4 oz (100 kg)   BMI 26.83 kg/m    GEN: Well nourished, well developed, in no acute distress  HEENT: normal  Neck: no JVD, carotid bruits, or masses Cardiac: RRR; no murmurs, rubs, or gallops,no edema  Respiratory:  clear to auscultation bilaterally, normal work of breathing GI: soft, nontender, nondistended, + BS MS: no deformity or atrophy  Skin: warm and dry, no rash Neuro:  Alert and Oriented x 3, Strength and sensation are intact Psych: euthymic mood, full affect  Wt Readings from Last 3 Encounters:  01/21/16 220 lb 6.4 oz (100 kg)  01/07/16 221 lb 6.4 oz (100.4 kg)  12/08/15 219 lb 3.2 oz (99.4 kg)      Studies/Labs Reviewed:   EKG:  EKG is ordered today.  The ekg ordered today demonstrates AV pacing 60 no significant change from prior.  Recent Labs: 09/23/2015: Brain Natriuretic Peptide 153.4; BUN 15; Creat 1.06; Hemoglobin 13.9; Platelets 191; Potassium 4.5; Sodium 140   Lipid Panel    Component Value Date/Time   CHOL 219 (H) 11/10/2014 0858   TRIG 99.0 11/10/2014 0858   HDL 50.60 11/10/2014 0858   CHOLHDL 4 11/10/2014 0858   VLDL 19.8 11/10/2014 0858   LDLCALC 148 (H) 11/10/2014 0858    Additional studies/ records that were reviewed today include:   ECHO 10/07/15:   - Left ventricle: The cavity size was normal. There was mild   concentric hypertrophy. Systolic function was normal. The    estimated ejection fraction was in the range of 60% to 65%. Wall   motion was normal; there were no regional wall motion   abnormalities. Doppler parameters are consistent with abnormal   left ventricular relaxation (grade 1 diastolic dysfunction). - Aortic valve: Trileaflet; mildly thickened, mildly calcified   leaflets. There was mild regurgitation. - Mitral valve: Mildly thickened leaflets . There was mild   regurgitation. - Left atrium: Anterior-posterior dimension: 38 mm. - Right ventricle: Pacer wire or catheter noted in right ventricle. - Right atrium:  Pacer wire or catheter noted in right atrium.    ASSESSMENT:    1. Paroxysmal atrial fibrillation (HCC)   2. Essential hypertension   3. Mobitz type II atrioventricular block   4. Cardiac pacemaker in situ      PLAN:  In order of problems listed above:  DOT cardiac exam  - From a cardiac perspective, he may proceed with DOT license. No high risk symptoms currently. Pacemaker functioning well. EF now normal. We will write him a letter to take to the DOT. He also needs a copy of his echocardiogram.  Sick sinus syndrome/Mobitz second degree heart block, 2  - St. Jude pacemaker. Normal past function.  Prior nonischemic cardiomyopathy  - Resolved ejection fraction. Now 65%. Excellent. No changes with medications.  Atrial tachycardia  - Prior interrogation does not appear to be atrial fibrillation. If mode switches are longer, consider anticoagulation in the future. He will occasionally feel a light flutter at home.      Medication Adjustments/Labs and Tests Ordered: Current medicines are reviewed at length with the patient today.  Concerns regarding medicines are outlined above.  Medication changes, Labs and Tests ordered today are listed in the Patient Instructions below. Patient Instructions  Medication Instructions:  The current medical regimen is effective;  continue present plan and  medications.  Follow-Up: Follow up as scheduled with Chanetta Marshall, NP.  If you need a refill on your cardiac medications before your next appointment, please call your pharmacy.  Thank you for choosing [facility]!!        Signed, Candee Furbish, MD  01/21/2016 4:21 PM    [facility] [address], [place], [place]  [address] Phone: [phone]; Fax: [phone]

## 2016-01-21 NOTE — Telephone Encounter (Signed)
Received call from patient asking to have echocardiogram results sent to DOT for a job clearance.  Advised that I spoke with medical records and DOT can fax a letter requesting which medical records are needed with their letter head.  He stated he would come to the office because he needs a driving clearance.  Advised I am not in the [place] office to assist him but ask at the desk if someone can help him.

## 2016-01-21 NOTE — Patient Instructions (Signed)
Medication Instructions:  The current medical regimen is effective;  continue present plan and medications.  Follow-Up: Follow up as scheduled with Chanetta Marshall, NP.  If you need a refill on your cardiac medications before your next appointment, please call your pharmacy.  Thank you for choosing [facility]!!

## 2016-01-26 ENCOUNTER — Other Ambulatory Visit: Payer: Self-pay | Admitting: Medical

## 2016-02-01 NOTE — Telephone Encounter (Signed)
Per provider VO; LMOM with contact name and number [for return call, if needed] RE: medication request per provider instructions, d/t extreme cautions reports and pt having numerous Cardiac issues, patient will need to have Cardiology refill & manage his Carvedilol prescription in the present and future [he was seen there on 01/21/16]. Also, on the Omeprazole, d/t extreme caution report, as well as Rx has not been filled in >12 mths; this medication will have to be reviewed by PCP before receiving approval to sent to pharmacy. Informed patient that he will receive a call back as soon as provider makes his decision. Also, that we will verify pharmacy, as requested by [2] different pharmacies/SLS 01/23

## 2016-02-01 NOTE — Telephone Encounter (Signed)
eScribe request from [facility] for refill on Carvedilol 6.25mg  Last filled - 12/13/15, #60x0 Last AEX - 12/08/15 Next AEX - 3-Mths; patient does not have future appointment scheduled [pt seen by Cardiology on 01/21/16] Extreme Caution Report: This patient population is at greater risk for Beta-Blocker-Induced Bradycardia.  Patient has numerous Cardiac-related health issues; Please Advise on refills/SLS 01/23  eScribe request from [facility] for refill on Omeprazole 20mg  Last filled - 11/09/2014, #30x3 Last AEX - 12/08/15, #30x3 Next AEX - 3-Mths; patient does not have future appointment scheduled Extreme Caution Report: Per [place] criteria, Proton Pump Inhibitors are identified as potentially inappropriate medications to be avoided as scheduled for more than 6-weeks in patient 65 years and older due to risk of C.Diff and bone loss/fractures. Use for more than 8 weeks should be avoided unless given for high-risk patients... Please Advise on refills/SLS 01/23

## 2016-02-01 NOTE — Telephone Encounter (Signed)
Caller name: Relationship to patient: Self Can be reached:[phone]  Pharmacy:  [place], [place] - [address] [phone] (Phone) [phone] (Fax)     Reason for call: Patient is out of carvedilol (COREG) 6.25 MG tablet  Please send to [facility]. Please call patient when Rx has been sent.

## 2016-02-02 NOTE — Telephone Encounter (Signed)
Pt does have history of sick sinus syndrome and now has pacemaker. But still advise him to call cardiologist office to get verification that they want him to be on carvedilol/refill and have him schedule his follow up with them.   Also regarding use of prilosec. I can refill but he should weigh benefit vs risk of proton pump inhibitor as you enumerated. If he can't get by without prilosec then use. Other option would be occasional use of zantac otc.

## 2016-02-14 ENCOUNTER — Ambulatory Visit: Payer: Medicare Other | Admitting: Medical

## 2016-02-15 ENCOUNTER — Ambulatory Visit (INDEPENDENT_AMBULATORY_CARE_PROVIDER_SITE_OTHER): Payer: Medicare Other | Admitting: Medical

## 2016-02-15 ENCOUNTER — Encounter: Payer: Self-pay | Admitting: Medical

## 2016-02-15 VITALS — BP 130/82 | HR 61 | Temp 98.0°F | Resp 16 | Ht 76.0 in | Wt 217.1 lb

## 2016-02-15 DIAGNOSIS — Z8679 Personal history of other diseases of the circulatory system: Secondary | ICD-10-CM

## 2016-02-15 DIAGNOSIS — E785 Hyperlipidemia, unspecified: Secondary | ICD-10-CM

## 2016-02-15 DIAGNOSIS — I10 Essential (primary) hypertension: Secondary | ICD-10-CM

## 2016-02-15 MED ORDER — ALBUTEROL SULFATE HFA 108 (90 BASE) MCG/ACT IN AERS
INHALATION_SPRAY | RESPIRATORY_TRACT | 0 refills | Status: DC
Start: 2016-02-15 — End: 2018-08-20

## 2016-02-15 MED ORDER — AMLODIPINE BESYLATE 10 MG PO TABS: ORAL_TABLET | ORAL | 1 refills | Status: DC

## 2016-02-15 MED ORDER — FLUTICASONE PROPIONATE 50 MCG/ACT NA SUSP
2.0000 | Freq: Every day | NASAL | 2 refills | Status: DC
Start: 2016-02-15 — End: 2017-02-07

## 2016-02-15 MED ORDER — FUROSEMIDE 20 MG PO TABS: 20.0000 mg | ORAL_TABLET | Freq: Every day | ORAL | 1 refills | Status: DC

## 2016-02-15 MED ORDER — OMEPRAZOLE 20 MG PO CPDR: 20.0000 mg | DELAYED_RELEASE_CAPSULE | Freq: Every day | ORAL | 1 refills | Status: DC

## 2016-02-15 MED ORDER — TAMSULOSIN HCL 0.4 MG PO CAPS
ORAL_CAPSULE | ORAL | 1 refills | Status: DC
Start: 2016-02-15 — End: 2016-03-16

## 2016-02-15 MED ORDER — ROSUVASTATIN CALCIUM 10 MG PO TABS: ORAL_TABLET | ORAL | 1 refills | Status: DC

## 2016-02-15 MED ORDER — LISINOPRIL 20 MG PO TABS: 20.0000 mg | ORAL_TABLET | Freq: Every day | ORAL | 1 refills | Status: DC

## 2016-02-15 MED ORDER — AZELASTINE HCL 0.1 % NA SOLN: 2.0000 | Freq: Two times a day (BID) | NASAL | 3 refills | Status: DC

## 2016-02-15 NOTE — Progress Notes (Signed)
Subjective:    Patient ID: Phillip Booth, male    DOB: 18-Nov-1950, 66 y.o.   MRN: [id number]  HPI   Pt in for follow up.  Pt has recent issues getting filling his scripts. [facility] did not want to [facility] his [facility]. He states they insinuated he ran out too early.    Pt has hx of htn and 2nd degree av block.(pt has pacemaker) He wants to change to our pharmacy due to problems filling the script.Pt sees a cardiologist and he was prescribing carvedilol in the past. I had refilled this medication in past as well following lead of cardiologist. Pt has been on this in the past for about 2 years. Pt is going to see cardiologist this Thursday. He has enough meds to get him to visit. No cardiac or neurologic signs or symptoms.  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  For your high cholesterol continue current meds and please get future labs that area already placed.    Review of Systems  Constitutional: Negative for chills, fatigue and fever.  HENT: Negative for congestion, ear pain and facial swelling.   Respiratory: Negative for cough, chest tightness, shortness of breath and wheezing.   Cardiovascular: Negative for chest pain and palpitations.  Gastrointestinal: Negative for abdominal pain.  Endocrine: Negative for polydipsia, polyphagia and polyuria.  Musculoskeletal: Negative for back pain and neck stiffness.  Skin: Negative for rash.  Neurological: Negative for dizziness, seizures, syncope and weakness.  Hematological: Negative for adenopathy. Does not bruise/bleed easily.  Psychiatric/Behavioral: Negative for behavioral problems and confusion.    Past Medical History:  Diagnosis Date  . Allergy   . CAD (coronary artery disease)    a. moderate by cath 03/2014  . Cataract   . CVA (cerebral infarction)    a. diagnosis not clear  . Hyperlipidemia   . Hypertension   . Non-ischemic cardiomyopathy ([place])      a. EF 45% by echo 03/2014  . Paroxysmal atrial fibrillation ([place]) 05/01/2014   asymptomatic, documented on PPM interrogation,  chads2vasc score is at least 6.  He is contemplating anticoagulation  . Symptomatic bradycardia    a. s/p STJ CRTP implanted by Dr Rayann Heman  . Vertigo      Social History   Social History  . Marital status: Married    Spouse name: N/A  . Number of children: N/A  . Years of education: N/A   Occupational History  . Not on file.   Social History Main Topics  . Smoking status: Never Smoker  . Smokeless tobacco: Never Used  . Alcohol use Yes     Comment: occasional  . Drug use: No  . Sexual activity: Yes   Other Topics Concern  . Not on file   Social History Narrative  . No narrative on file    Past Surgical History:  Procedure Laterality Date  . ABDOMINAL SURGERY    . APPENDECTOMY    . BI-VENTRICULAR PACEMAKER INSERTION N/A 03/19/2014   STJ CRTP implanted by Dr Rayann Heman  . EYE SURGERY     Cataract removed from Right eye  . HERNIA REPAIR    . LEFT HEART CATHETERIZATION WITH CORONARY ANGIOGRAM N/A 03/19/2014   Procedure: LEFT HEART CATHETERIZATION WITH CORONARY ANGIOGRAM;  Surgeon: [name], MD;  Location: [facility] CATH LAB;  Service: Cardiovascular;  Laterality: N/A;    Family History  Problem Relation Age of Onset  .  Hypertension Mother   . Cancer Mother     brain (possibly started in lung)  . Heart disease Mother   . Diabetes Mother   . Other Mother     cardiomegaly  . Hypertension Father   . Emphysema Father   . Heart Problems Brother     pacemaker  . Heart Problems Other     "using 10% of heart"    No Known Allergies  Current Outpatient Prescriptions on File Prior to Visit  Medication Sig Dispense Refill  . amLODipine (NORVASC) 10 MG tablet TAKE 1 TABLET (10 MG TOTAL) BY MOUTH DAILY. 90 tablet 1  . aspirin 81 MG tablet Take 81 mg by mouth daily.     Marland Kitchen azelastine (ASTELIN) 0.1 % nasal spray Place 2 sprays into both nostrils 2  (two) times daily. Use in each nostril as directed 30 mL 3  . carvedilol (COREG) 6.25 MG tablet TAKE 1 TABLET (6.25 MG TOTAL) BY MOUTH 2 (TWO) TIMES DAILY WITH A MEAL. 60 tablet 0  . fluticasone (FLONASE) 50 MCG/ACT nasal spray PLACE 2 SPRAYS INTO BOTH NOSTRILS DAILY. 16 g 2  . furosemide (LASIX) 20 MG tablet TAKE 1 TABLET (20 MG TOTAL) BY MOUTH DAILY. 90 tablet 2  . lisinopril (PRINIVIL,ZESTRIL) 20 MG tablet Take 1 tablet (20 mg total) by mouth daily. 90 tablet 3  . omeprazole (PRILOSEC) 20 MG capsule TAKE 1 CAPSULE (20 MG TOTAL) BY MOUTH DAILY. 30 capsule 2  . rosuvastatin (CRESTOR) 10 MG tablet TAKE 1 TABLET (10 MG TOTAL) BY MOUTH DAILY. 30 tablet 3  . sildenafil (VIAGRA) 25 MG tablet Take 1 tablet (25 mg total) by mouth daily as needed for erectile dysfunction (1 tab po as needed prior to sex. max 1 tab po q day). 10 tablet 0  . tamsulosin (FLOMAX) 0.4 MG CAPS capsule TAKE 1 CAPSULE (0.4 MG TOTAL) BY MOUTH DAILY. 90 capsule 1  . VENTOLIN HFA 108 (90 Base) MCG/ACT inhaler INHALE 2 PUFFS INTO THE LUNGS EVERY 6 (SIX) HOURS AS NEEDED FOR WHEEZING OR SHORTNESS OF BREATH. 18 Inhaler 0   No current facility-administered medications on file prior to visit.     BP 130/82 (BP Location: Right Arm, Patient Position: Sitting, Cuff Size: Large)   Pulse 61   Temp 98 F (36.7 C) (Oral)   Resp 16   Ht 6\' 4"  (1.93 m)   Wt 217 lb 2 oz (98.5 kg)   SpO2 99%   BMI 26.43 kg/m       Objective:   Physical Exam   General Mental Status- Alert. General Appearance- Not in acute distress.   Skin General: Color- Normal Color. Moisture- Normal Moisture.  Neck Carotid Arteries- Normal color. Moisture- Normal Moisture. No carotid bruits. No JVD.  Chest and Lung Exam Auscultation: Breath Sounds:-Normal.  Cardiovascular Auscultation:Rythm- Regular. Murmurs & Other Heart Sounds:Auscultation of the heart reveals- No Murmurs.  Abdomen Inspection:-Inspeection Normal. Palpation/Percussion:Note:No mass.  Palpation and Percussion of the abdomen reveal- Non Tender, Non Distended + BS, no rebound or guarding.    Neurologic Cranial Nerve exam:- CN III-XII intact(No nystagmus), symmetric smile. Strength:- 5/5 equal and symmetric strength both upper and lower extremities.     Assessment & Plan:  For your high blood pressure and hx of sick sinus syndrome continue to follow up with your cardiologist.  I want you to follow cardiologist recommendation on carvedilol use.(see them this Thursday)  Please get the lipid panel and cmp by end of the month. You can schedule  that today.  Follow up date in 3-6 months depending on lab review.   Raeshaun Simson, Percell Miller, PA-C

## 2016-02-15 NOTE — Progress Notes (Signed)
Pre visit review using our clinic review tool, if applicable. No additional management support is needed unless otherwise documented below in the visit note. 

## 2016-02-15 NOTE — Patient Instructions (Addendum)
For your high blood pressure and hx of sick sinus syndrome continue to follow up with your cardiologist.  I want you to follow cardiologist recommendation on carvedilol use.(see them this Thursday)  Please get the lipid panel and cmp by end of the month. You can schedule that today.  Follow up date in 3-6 months depending on lab review.

## 2016-02-15 NOTE — Progress Notes (Signed)
Pre visit review using our clinic review tool, if applicable. No additional management support is needed unless otherwise documented below in the visit note/SLS  

## 2016-02-16 ENCOUNTER — Telehealth: Payer: Self-pay | Admitting: *Deleted

## 2016-02-16 NOTE — Progress Notes (Signed)
Electrophysiology Office Note Date: 02/17/2016  ID:  Phillip Booth, DOB [birth year], MRN [id number]  PCP: Phillip Booth Primary Cardiologist: Phillip Booth Electrophysiologist: Phillip Booth  CC: routine pacemaker follow up  Phillip Booth is a 66 y.o. male seen today for Dr Phillip Booth. He underwent CRTP implantation in March of 2016 and has had significant functional improvement in symptoms. Since last being seen in clinic, he reports doing reasonably well. 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 He denies chest pain, palpitations, PND, orthopnea, nausea, vomiting, dizziness, syncope, or early satiety.  Device History: STJ CRTP implanted 2016 for NICM, CHF   Past Medical History:  Diagnosis Date  . Allergy   . CAD (coronary artery disease)    a. moderate by cath 03/2014  . Cataract   . CVA (cerebral infarction)    a. diagnosis not clear  . Hyperlipidemia   . Hypertension   . Non-ischemic cardiomyopathy ([place])    a. EF 45% by echo 03/2014  . Paroxysmal atrial fibrillation ([facility]) 05/01/2014   asymptomatic, documented on PPM interrogation,  chads2vasc score is at least 6.  He is contemplating anticoagulation  . Symptomatic bradycardia    a. s/p STJ CRTP implanted by Dr Phillip Booth  . Vertigo    Past Surgical History:  Procedure Laterality Date  . ABDOMINAL SURGERY    . APPENDECTOMY    . BI-VENTRICULAR PACEMAKER INSERTION N/A 03/19/2014   STJ CRTP implanted by Dr Phillip Booth  . EYE SURGERY     Cataract removed from Right eye  . HERNIA REPAIR    . LEFT HEART CATHETERIZATION WITH CORONARY ANGIOGRAM N/A 03/19/2014   Procedure: LEFT HEART CATHETERIZATION WITH CORONARY ANGIOGRAM;  Surgeon: Phillip Sine, MD;  Location: [facility] CATH LAB;  Service: Cardiovascular;  Laterality: N/A;    Current Outpatient Prescriptions  Medication Sig Dispense Refill  . albuterol (VENTOLIN HFA) 108 (90 Base) MCG/ACT inhaler INHALE 2 PUFFS INTO THE LUNGS EVERY 6 (SIX) HOURS AS NEEDED FOR WHEEZING  OR SHORTNESS OF BREATH. 18 Inhaler 0  . amLODipine (NORVASC) 10 MG tablet TAKE 1 TABLET (10 MG TOTAL) BY MOUTH DAILY. 90 tablet 1  . aspirin 81 MG tablet Take 81 mg by mouth daily.     Marland Kitchen azelastine (ASTELIN) 0.1 % nasal spray Place 2 sprays into both nostrils 2 (two) times daily. Use in each nostril as directed 30 mL 3  . carvedilol (COREG) 6.25 MG tablet TAKE 1 TABLET (6.25 MG TOTAL) BY MOUTH 2 (TWO) TIMES DAILY WITH A MEAL. 60 tablet 0  . fluticasone (FLONASE) 50 MCG/ACT nasal spray Place 2 sprays into both nostrils daily. 16 g 2  . furosemide (LASIX) 20 MG tablet Take 1 tablet (20 mg total) by mouth daily. 90 tablet 1  . lisinopril (PRINIVIL,ZESTRIL) 20 MG tablet Take 1 tablet (20 mg total) by mouth daily. 90 tablet 1  . omeprazole (PRILOSEC) 20 MG capsule Take 1 capsule (20 mg total) by mouth daily. 90 capsule 1  . rosuvastatin (CRESTOR) 10 MG tablet TAKE 1 TABLET (10 MG TOTAL) BY MOUTH DAILY. 90 tablet 1  . sildenafil (VIAGRA) 25 MG tablet Take 1 tablet (25 mg total) by mouth daily as needed for erectile dysfunction (1 tab po as needed prior to sex. max 1 tab po q day). 10 tablet 0  . tamsulosin (FLOMAX) 0.4 MG CAPS capsule TAKE 1 CAPSULE (0.4 MG TOTAL) BY MOUTH DAILY. 90 capsule 1   No current facility-administered medications for this visit.  Allergies:   Lactose intolerance (gi)   Social History: Social History   Social History  . Marital status: Married    Spouse name: N/A  . Number of children: N/A  . Years of education: N/A   Occupational History  . Not on file.   Social History Main Topics  . Smoking status: Never Smoker  . Smokeless tobacco: Never Used  . Alcohol use Yes     Comment: occasional  . Drug use: No  . Sexual activity: Yes   Other Topics Concern  . Not on file   Social History Narrative  . No narrative on file    Family History: Family History  Problem Relation Age of Onset  . Hypertension Mother   . Cancer Mother     brain (possibly  started in lung)  . Heart disease Mother   . Diabetes Mother   . Other Mother     cardiomegaly  . Hypertension Father   . Emphysema Father   . Heart Problems Brother     pacemaker  . Heart Problems Other     "using 10% of heart"     Review of Systems: All other systems reviewed and are otherwise negative except as noted above.   Physical Exam: VS:  BP (!) 150/90   Pulse (!) 56   Ht 6\' 4"  (1.93 m)   Wt 217 lb (98.4 kg)   BMI 26.41 kg/m  , BMI Body mass index is 26.41 kg/m.  GEN- The patient is well appearing, alert and oriented x 3 today.  HEENT: normocephalic, atraumatic; sclera clear, conjunctiva pink; hearing intact; oropharynx clear; neck supple Lungs- Clear to ausculation bilaterally, normal work of breathing. No wheezes, rales, rhonchi Heart- Regular rate and rhythm, 2/6 SEM GI- soft, non-tender, non-distended, bowel sounds present Extremities- no clubbing, cyanosis, or edema; DP/PT/radial pulses 2+ bilaterally MS- no significant deformity or atrophy Skin- warm and dry, no rash or lesion; PPM pocket well healed Psych- euthymic mood, full affect Neuro- strength and sensation are intact  Wt Readings from Last 3 Encounters:  02/17/16 217 lb (98.4 kg)  02/15/16 217 lb 2 oz (98.5 kg)  01/21/16 220 lb 6.4 oz (100 kg)    PPM Interrogation- reviewed in detail today,  See PACEART report  EKG:  EKG is not ordered today.  Recent Labs: 09/23/2015: Brain Natriuretic Peptide 153.4; BUN 15; Creat 1.06; Hemoglobin 13.9; Platelets 191; Potassium 4.5; Sodium 140   Wt Readings from Last 3 Encounters:  02/17/16 217 lb (98.4 kg)  02/15/16 217 lb 2 oz (98.5 kg)  01/21/16 220 lb 6.4 oz (100 kg)     Other studies Reviewed: Additional studies/ records that were reviewed today include: Dr Jackalyn Lombard last office note  Assessment and Plan:  1.  Sick sinus syndrome/Mobitz II heart block Normal CRTP function See Pace Art report No changes today  2.  NICM/chronic systolic  heart failure Euvolemic on exam Continue current medical therapy for now EF normalized post CRT  3.  Paroxysmal atrial fibrillation Burden is <1%  Today and episodes are atrial tach, not atrial fibrillation. All episodes <5 minutes CHADS2VASC is at least 6 If he has mode switches that are true atrial fibrillation, will need discussion about anticoagulation again.   4.  HTN BP elevated today but stable at home  Continue current therapy      Current medicines are reviewed at length with the patient today.   The patient does not have concerns regarding his medicines.  The following  changes were made today:  none  Labs/ tests ordered today include:  Orders Placed This Encounter  Procedures  . CUP PACEART INCLINIC DEVICE CHECK     Disposition:   Follow up with [facility], [facility] clinic, follow up with Dr Phillip Booth 1 year, Dr Phillip Booth as scheduled.   Signed, Chanetta Marshall, NP 02/17/2016 11:25 AM  [place] [address] [phone] (office) [phone] (fax)

## 2016-02-16 NOTE — Telephone Encounter (Signed)
LMOM with contact name and number [for return call, if needed] RE: requested Print prescriptions ready for p/u at front desk during regular business hours/SLS 02/07

## 2016-02-17 ENCOUNTER — Encounter: Payer: Medicare Other | Admitting: Nurse Practitioner

## 2016-02-17 ENCOUNTER — Ambulatory Visit (INDEPENDENT_AMBULATORY_CARE_PROVIDER_SITE_OTHER): Payer: Medicare Other | Admitting: Nurse Practitioner

## 2016-02-17 VITALS — BP 150/90 | HR 56 | Ht 76.0 in | Wt 217.0 lb

## 2016-02-17 DIAGNOSIS — I10 Essential (primary) hypertension: Secondary | ICD-10-CM

## 2016-02-17 DIAGNOSIS — I480 Paroxysmal atrial fibrillation: Secondary | ICD-10-CM

## 2016-02-17 DIAGNOSIS — I428 Other cardiomyopathies: Secondary | ICD-10-CM | POA: Diagnosis not present

## 2016-02-17 DIAGNOSIS — I495 Sick sinus syndrome: Secondary | ICD-10-CM | POA: Diagnosis not present

## 2016-02-17 DIAGNOSIS — I441 Atrioventricular block, second degree: Secondary | ICD-10-CM

## 2016-02-17 LAB — CUP PACEART INCLINIC DEVICE CHECK
Implantable Lead Implant Date: 20160310
Implantable Lead Implant Date: 20160310
Implantable Lead Location: 753858
Implantable Lead Location: 753859
Implantable Lead Model: 1948
MDC IDC LEAD IMPLANT DT: 20160310
MDC IDC LEAD LOCATION: 753860
MDC IDC PG IMPLANT DT: 20160310
MDC IDC SESS DTM: 20180208110855
Pulse Gen Serial Number: 7724805

## 2016-02-17 NOTE — Patient Instructions (Signed)
Medication Instructions:   Your physician recommends that you continue on your current medications as directed. Please refer to the Current Medication list given to you today.   If you need a refill on your cardiac medications before your next appointment, please call your pharmacy.  Labwork: NONE ORDERED  TODAY    Testing/Procedures: NONE ORDERED  TODAY    Follow-Up:  Your physician wants you to follow-up in: [place] will receive a reminder letter in the mail two months in advance. If you don't receive a letter, please call our office to schedule the follow-up appointment.  Remote monitoring is used to monitor your Pacemaker of ICD from home. This monitoring reduces the number of office visits required to check your device to one time per year. It allows [name] to keep an eye on the functioning of your device to ensure it is working properly. You are scheduled for a device check from home on..05/17/2016..You may send your transmission at any time that day. If you have a wireless device, the transmission will be sent automatically. After your physician reviews your transmission, you will receive a postcard with your next transmission date.     Any Other Special Instructions Will Be Listed Below (If Applicable).

## 2016-03-06 ENCOUNTER — Emergency Department (HOSPITAL_BASED_OUTPATIENT_CLINIC_OR_DEPARTMENT_OTHER)
Admission: EM | Admit: 2016-03-06 | Discharge: 2016-03-06 | Disposition: A | Discharge status: Home / Self Care | Payer: Medicare Other | Attending: Emergency Medicine | Admitting: Emergency Medicine

## 2016-03-06 ENCOUNTER — Encounter (HOSPITAL_BASED_OUTPATIENT_CLINIC_OR_DEPARTMENT_OTHER): Payer: Self-pay | Admitting: Emergency Medicine

## 2016-03-06 ENCOUNTER — Other Ambulatory Visit (INDEPENDENT_AMBULATORY_CARE_PROVIDER_SITE_OTHER): Payer: Medicare Other

## 2016-03-06 DIAGNOSIS — I10 Essential (primary) hypertension: Secondary | ICD-10-CM | POA: Diagnosis not present

## 2016-03-06 DIAGNOSIS — Z79899 Other long term (current) drug therapy: Secondary | ICD-10-CM | POA: Insufficient documentation

## 2016-03-06 DIAGNOSIS — Z7982 Long term (current) use of aspirin: Secondary | ICD-10-CM | POA: Diagnosis not present

## 2016-03-06 DIAGNOSIS — E785 Hyperlipidemia, unspecified: Secondary | ICD-10-CM

## 2016-03-06 DIAGNOSIS — J302 Other seasonal allergic rhinitis: Secondary | ICD-10-CM | POA: Diagnosis not present

## 2016-03-06 DIAGNOSIS — I2510 Atherosclerotic heart disease of native coronary artery without angina pectoris: Secondary | ICD-10-CM | POA: Diagnosis not present

## 2016-03-06 DIAGNOSIS — F4320 Adjustment disorder, unspecified: Secondary | ICD-10-CM | POA: Diagnosis not present

## 2016-03-06 DIAGNOSIS — F4321 Adjustment disorder with depressed mood: Secondary | ICD-10-CM

## 2016-03-06 LAB — LIPID PANEL
CHOL/HDL RATIO: 3
CHOLESTEROL: 163 mg/dL (ref 0–200)
HDL: 55.1 mg/dL (ref >39.00)
LDL CALC: 97 mg/dL (ref 0–99)
NonHDL: 108.28
TRIGLYCERIDES: 58 mg/dL (ref 0.0–149.0)
VLDL: 11.6 mg/dL (ref 0.0–40.0)

## 2016-03-06 LAB — COMPREHENSIVE METABOLIC PANEL
ALBUMIN: 3.9 g/dL (ref 3.5–5.2)
ALT: 16 U/L (ref 0–53)
AST: 20 U/L (ref 0–37)
Alkaline Phosphatase: 66 U/L (ref 39–117)
BILIRUBIN TOTAL: 1.4 mg/dL — AB (ref 0.2–1.2)
BUN: 12 mg/dL (ref 6–23)
CALCIUM: 9.8 mg/dL (ref 8.4–10.5)
CO2: 28 mEq/L (ref 19–32)
CREATININE: 0.93 mg/dL (ref 0.40–1.50)
Chloride: 103 mEq/L (ref 96–112)
GFR: 104.03 mL/min (ref >60.00)
Glucose, Bld: 111 mg/dL — ABNORMAL HIGH (ref 70–99)
Potassium: 4.7 mEq/L (ref 3.5–5.1)
SODIUM: 137 meq/L (ref 135–145)
Total Protein: 7.6 g/dL (ref 6.0–8.3)

## 2016-03-06 MED ORDER — FLUTICASONE PROPIONATE 50 MCG/ACT NA SUSP: 2.0000 | Freq: Every day | NASAL | 0 refills | Status: DC

## 2016-03-06 MED ORDER — CETIRIZINE HCL 10 MG PO CAPS: 10.0000 mg | ORAL_CAPSULE | Freq: Every day | ORAL | 0 refills | Status: DC

## 2016-03-06 MED ORDER — DEXAMETHASONE 6 MG PO TABS
10.0000 mg | ORAL_TABLET | Freq: Once | ORAL | Status: AC
  Administered 2016-03-06: 10 mg via ORAL
  Filled 2016-03-06: qty 1

## 2016-03-06 NOTE — ED Provider Notes (Addendum)
TIME SEEN: 5:50 AM  CHIEF COMPLAINT: "I'm concerned about my blood pressure"  HPI: Patient is a 66 year old male with history of paroxysmal atrial fibrillation not on anticoagulation, sick sinus syndrome and second-degree AV block status post pacemaker, hypertension, hyperlipidemia, nonobstructive CAD, nonischemic cardiomyopathy who presents to the emergency department with complaints of hypertension. States that he recently lost 2 nephews on the same day. He states that they just had the funerals for both of his nephews. Reports yesterday he had a "subtle" pressure-like diffuse headache and some blurry vision. When he got home he had his wife check his blood pressure and it was 184/105. This concerned him. He states his normal blood pressures in the 120s/70s but he admits that he does not check it regularly "like I should". Appears he is on Norvasc 10 mg daily, Coreg 6.25 mg twice daily, Lasix 20 mg daily and lisinopril 20 mg daily. He reports compliance with these medications. He states that he no longer has a headache but feels like his "allergies are acting up". He is supposed to be on Flonase daily but has not been taking it. Has had some sinus pressure, rhinorrhea and dry cough. No fevers. He denies any current headache or vision changes. Denies any chest pain or shortness of breath. No numbness, tingling or focal weakness. No changes in his speech. He states that he also thinks that he is working too much and needs to take some time off work. He denies any SI or HI. He has an appointment with his primary care physician today.  ROS: See HPI Constitutional: no fever  Eyes: no drainage  ENT: no runny nose   Cardiovascular:  no chest pain  Resp: no SOB  GI: no vomiting GU: no dysuria Integumentary: no rash  Allergy: no hives  Musculoskeletal: no leg swelling  Neurological: no slurred speech ROS otherwise negative  PAST MEDICAL HISTORY/PAST SURGICAL HISTORY:  Past Medical History:  Diagnosis  Date  . Allergy   . CAD (coronary artery disease)    a. moderate by cath 03/2014  . Cataract   . CVA (cerebral infarction)    a. diagnosis not clear  . Hyperlipidemia   . Hypertension   . Non-ischemic cardiomyopathy ([place])    a. EF 45% by echo 03/2014  . Paroxysmal atrial fibrillation ([place]) 05/01/2014   asymptomatic, documented on PPM interrogation,  chads2vasc score is at least 6.  He is contemplating anticoagulation  . Symptomatic bradycardia    a. s/p STJ CRTP implanted by Dr Rayann Heman  . Vertigo     MEDICATIONS:  Prior to Admission medications   Medication Sig Start Date End Date Taking? Authorizing Provider  albuterol (VENTOLIN HFA) 108 (90 Base) MCG/ACT inhaler INHALE 2 PUFFS INTO THE LUNGS EVERY 6 (SIX) HOURS AS NEEDED FOR WHEEZING OR SHORTNESS OF BREATH. 02/15/16   [name], PA-C  amLODipine (NORVASC) 10 MG tablet TAKE 1 TABLET (10 MG TOTAL) BY MOUTH DAILY. 02/15/16   [name], PA-C  aspirin 81 MG tablet Take 81 mg by mouth daily.     Historical Provider, MD  azelastine (ASTELIN) 0.1 % nasal spray Place 2 sprays into both nostrils 2 (two) times daily. Use in each nostril as directed 02/15/16   [name], PA-C  carvedilol (COREG) 6.25 MG tablet TAKE 1 TABLET (6.25 MG TOTAL) BY MOUTH 2 (TWO) TIMES DAILY WITH A MEAL. 12/13/15   Edward Saguier, PA-C  fluticasone (FLONASE) 50 MCG/ACT nasal spray Place 2 sprays into both nostrils daily. 02/15/16  Edward Saguier, PA-C  furosemide (LASIX) 20 MG tablet Take 1 tablet (20 mg total) by mouth daily. 02/15/16   Percell Miller Saguier, PA-C  lisinopril (PRINIVIL,ZESTRIL) 20 MG tablet Take 1 tablet (20 mg total) by mouth daily. 02/15/16   Percell Miller Saguier, PA-C  omeprazole (PRILOSEC) 20 MG capsule Take 1 capsule (20 mg total) by mouth daily. 02/15/16   Percell Miller Saguier, PA-C  rosuvastatin (CRESTOR) 10 MG tablet TAKE 1 TABLET (10 MG TOTAL) BY MOUTH DAILY. 02/15/16   [name], PA-C  sildenafil (VIAGRA) 25 MG tablet Take 1 tablet (25 mg total) by mouth daily  as needed for erectile dysfunction (1 tab po as needed prior to sex. max 1 tab po q day). 08/26/14   Percell Miller Saguier, PA-C  tamsulosin (FLOMAX) 0.4 MG CAPS capsule TAKE 1 CAPSULE (0.4 MG TOTAL) BY MOUTH DAILY. 02/15/16   [name], PA-C    ALLERGIES:  Allergies  Allergen Reactions  . Lactose Intolerance (Gi) Hives and Nausea And Vomiting    SOCIAL HISTORY:  Social History  Substance Use Topics  . Smoking status: Never Smoker  . Smokeless tobacco: Never Used  . Alcohol use Yes     Comment: occasional    FAMILY HISTORY: Family History  Problem Relation Age of Onset  . Hypertension Mother   . Cancer Mother     brain (possibly started in lung)  . Heart disease Mother   . Diabetes Mother   . Other Mother     cardiomegaly  . Hypertension Father   . Emphysema Father   . Heart Problems Brother     pacemaker  . Heart Problems Other     "using 10% of heart"    EXAM: BP 159/99 (BP Location: Right Arm)   Pulse 60   Temp 98.4 F (36.9 C) (Oral)   Resp 19   Ht 6\' 4"  (1.93 m)   Wt 217 lb (98.4 kg)   SpO2 100%   BMI 26.41 kg/m  CONSTITUTIONAL: Alert and oriented and responds appropriately to questions. Well-appearing; well-nourished, Nontoxic, afebrile, extremely pleasant and very well-appearing HEAD: Normocephalic EYES: Conjunctivae clear, PERRL, EOMI ENT: normal nose; no rhinorrhea; moist mucous membranes; No pharyngeal erythema or petechiae, no tonsillar hypertrophy or exudate, no uvular deviation, no unilateral swelling, no trismus or drooling, no muffled voice, normal phonation, no stridor, no dental caries present, no drainable dental abscess noted, no Ludwig's angina, tongue sits flat in the bottom of the mouth, no angioedema, no facial erythema or warmth, no facial swelling; no pain with movement of the neck; patient does have pale and inflamed bilateral nasal turbinates and some maxillary sinus tenderness on exam bilaterally NECK: Supple, no meningismus, no nuchal  rigidity, no LAD  CARD: RRR; S1 and S2 appreciated; no murmurs, no clicks, no rubs, no gallops RESP: Normal chest excursion without splinting or tachypnea; breath sounds clear and equal bilaterally; no wheezes, no rhonchi, no rales, no hypoxia or respiratory distress, speaking full sentences ABD/GI: Normal bowel sounds; non-distended; soft, non-tender, no rebound, no guarding, no peritoneal signs, no hepatosplenomegaly BACK:  The back appears normal and is non-tender to palpation, there is no CVA tenderness EXT: Normal ROM in all joints; non-tender to palpation; no edema; normal capillary refill; no cyanosis, no calf tenderness or swelling    SKIN: Normal color for age and race; warm; no rash NEURO: Moves all extremities equally, sensation to light touch intact diffusely, cranial nerves II through XII intact, normal speech, gait is normal PSYCH: The patient's mood and manner  are appropriate. Grooming and personal hygiene are appropriate. No SI or HI.  MEDICAL DECISION MAKING: Patient here complains of hypertension. Did have headache and blurry vision yesterday but this has resolved. Now asymptomatic. Blood pressure has improved and is 159/99. Was seen by his PCP on 02/15/16 and his blood pressure at that time was 130/82. Was seen by his cardiologist on 02/17/16 and blood pressure was 150/90. At that time he had his pacemaker interrogated and there were evidence of atrial tachycardia but no atrial fibrillation or other concerning events. He is on blood pressure medication and reports compliance. No chest pain or shortness of breath currently. No headache. Neurologically intact. I do not feel we should adjust his blood pressure medication from the emergency department. I do not feel he needs emergent workup at this time. He has follow-up with his PCP scheduled today and have encouraged him to go to this appointment.  You feel that a lot of his symptoms are likely from a grief reaction given the loss of 2  family members. I feel that the patient needed to hear from a healthcare professional that it is okay for him to take time off for himself. Have provided him with a work note. His mood seems appropriate and he does not have any SI or HI. I have recommended he take time to relax and this will likely help with his blood pressure as well.  He also complains of his allergies "acting up". Has had some rhinorrhea and inflamed bilateral nasal turbulence with some maxillary sinus pressure. Doubt sinus infection. No fevers. His lungs are clear without productive cough. I do not feel he needs antibiotics. Have encouraged him to restart his Flonase. We'll give him a dose of oral Decadron here for symptomatic relief. We'll also start him on Zyrtec. Have recommended he avoid decongestants such as phenylephrine, pseudoephedrine as these will drive his blood pressure up.   I feel patient is safe to be discharged home without further emergent workup. Have again encouraged him to go to his primary care appointment today. Discussed at length return precautions. He is comfortable with this plan.    At this time, I do not feel there is any life-threatening condition present. I have reviewed and discussed all results (EKG, imaging, lab, urine as appropriate) and exam findings with patient/family. I have reviewed nursing notes and appropriate previous records.  I feel the patient is safe to be discharged home without further emergent workup and can continue workup as an outpatient as needed. Discussed usual and customary return precautions. Patient/family verbalize understanding and are comfortable with this plan.  Outpatient follow-up has been provided. All questions have been answered.      [place], DO 03/06/16 [place], DO 03/06/16 [id number]

## 2016-03-06 NOTE — ED Triage Notes (Signed)
Pt reports high blood pressure readings at home last night and this morning 180s-190s/100-110. Reports HA yesterday. Only reports feeling "groggy" today, and not sleeping well last night. Pt reports losing 2 family members recently.

## 2016-03-06 NOTE — Discharge Instructions (Signed)
Please continue your Flonase for nasal congestion, allergies. We are also starting you on Zyrtec which is allergy medication that will not affect your blood pressure. I do not think that you have a sinus infection at this time or you need antibiotics.   I recommend you take the next week off of work and rest. I suspect that a lot of your symptoms are secondary to grief from the recent loss of 2 family members. We are sorry for your loss.   I feel you're safe to be discharged home and follow-up with your primary care physician as scheduled today. Your blood pressure has improved and you have no symptoms at this time.  Please continue your home blood pressure medications as prescribed.   I do not feel we should adjust your blood pressure medication from the emergency department but that should be followed closely by your primary care provider. If you ever develop severe headache, changes in your vision, chest pain or shortness of breath, numbness or weakness on one side of your body, changes in your speech or facial droop, please return to the hospital.

## 2016-03-10 ENCOUNTER — Other Ambulatory Visit: Payer: Self-pay | Admitting: Medical

## 2016-03-16 NOTE — Telephone Encounter (Signed)
Rx request to pharmacy/SLS  Please call patient and schedule ED follow-up; must be 30-minutes for patient per provider/SLS 03/08

## 2016-03-16 NOTE — Telephone Encounter (Signed)
Called patient and scheduled him for Monday 3/12.

## 2016-03-20 ENCOUNTER — Encounter: Payer: Self-pay | Admitting: Medical

## 2016-03-20 ENCOUNTER — Ambulatory Visit (INDEPENDENT_AMBULATORY_CARE_PROVIDER_SITE_OTHER): Payer: Medicare Other | Admitting: Medical

## 2016-03-20 ENCOUNTER — Ambulatory Visit: Payer: Medicare Other | Admitting: Medical

## 2016-03-20 VITALS — BP 118/80 | HR 59 | Temp 97.9°F | Resp 16 | Ht 76.0 in | Wt 218.0 lb

## 2016-03-20 DIAGNOSIS — E785 Hyperlipidemia, unspecified: Secondary | ICD-10-CM

## 2016-03-20 DIAGNOSIS — R5383 Other fatigue: Secondary | ICD-10-CM

## 2016-03-20 DIAGNOSIS — I10 Essential (primary) hypertension: Secondary | ICD-10-CM | POA: Diagnosis not present

## 2016-03-20 MED ORDER — CARVEDILOL 6.25 MG PO TABS: ORAL_TABLET | ORAL | 3 refills | Status: DC

## 2016-03-20 MED ORDER — SILDENAFIL CITRATE 20 MG PO TABS: ORAL_TABLET | ORAL | 0 refills | Status: DC

## 2016-03-20 MED ORDER — SILDENAFIL CITRATE 25 MG PO TABS: ORAL_TABLET | ORAL | 1 refills | Status: DC

## 2016-03-20 NOTE — Progress Notes (Signed)
Pre visit review using our clinic review tool, if applicable. No additional management support is needed unless otherwise documented below in the visit note/SLS  

## 2016-03-20 NOTE — Patient Instructions (Addendum)
Your bp is much improved today. Continue your currrent regimen. Refilled carvedilol today.  Your lipids are well controlled. Continue the crestor.  For fatigue am adding future b12, vit d level, cbc and tsh.  For ED did rx viagra as you note cardiologist told you it was ok.  Follow up in 3 months or as needed

## 2016-03-20 NOTE — Progress Notes (Signed)
Subjective:    Patient ID: Phillip Booth, male    DOB: Jun 16, 1950, 66 y.o.   MRN: [id number]  HPI  Pt bp was moderate elevated. He thinks related to working too much, grief, and eating pork.  Pt is in for follow up from the ED. He had some high blood pressure reading recently when stressed out and likely experiencing grief reaction. Pt bp is well controlled today. No cardiac or neurologic signs or symptoms. Pt bp today is 118/80. No med changes from the ED.   Pt lipid recently very well controlled.   Pt mood is better than it was in the recent passed.  Pt states at times he feels fatigue and wonders if he needs vitamins.  Pt in the past had had asked for viagra. Pt stated in past cardiologist ok'd him to be on viagra per 08-26-2014. Pt states he had again talked to his cardiologist office just recently and they ok'd for him to use. He states he was told by cardiologist could use.    Review of Systems  Constitutional: Negative for chills, fatigue and fever.  Respiratory: Negative for cough, chest tightness and shortness of breath.   Cardiovascular: Negative for chest pain and palpitations.  Gastrointestinal: Negative for abdominal pain.  Genitourinary: Negative for difficulty urinating and dysuria.  Musculoskeletal: Negative for back pain and neck pain.  Skin: Negative for rash.  Neurological: Negative for dizziness, seizures, weakness and headaches.  Hematological: Negative for adenopathy. Does not bruise/bleed easily.  Psychiatric/Behavioral: Negative for behavioral problems and confusion.       Stable.    Past Medical History:  Diagnosis Date  . Allergy   . CAD (coronary artery disease)    a. moderate by cath 03/2014  . Cataract   . CVA (cerebral infarction)    a. diagnosis not clear  . Hyperlipidemia   . Hypertension   . Non-ischemic cardiomyopathy ([place])    a. EF 45% by echo 03/2014  . Paroxysmal atrial fibrillation ([place]) 05/01/2014   asymptomatic, documented on PPM  interrogation,  chads2vasc score is at least 6.  He is contemplating anticoagulation  . Symptomatic bradycardia    a. s/p STJ CRTP implanted by Dr Rayann Heman  . Vertigo      Social History   Social History  . Marital status: Married    Spouse name: N/A  . Number of children: N/A  . Years of education: N/A   Occupational History  . Not on file.   Social History Main Topics  . Smoking status: Never Smoker  . Smokeless tobacco: Never Used  . Alcohol use Yes     Comment: occasional  . Drug use: No  . Sexual activity: Yes   Other Topics Concern  . Not on file   Social History Narrative  . No narrative on file    Past Surgical History:  Procedure Laterality Date  . ABDOMINAL SURGERY    . APPENDECTOMY    . BI-VENTRICULAR PACEMAKER INSERTION N/A 03/19/2014   STJ CRTP implanted by Dr Rayann Heman  . EYE SURGERY     Cataract removed from Right eye  . HERNIA REPAIR    . LEFT HEART CATHETERIZATION WITH CORONARY ANGIOGRAM N/A 03/19/2014   Procedure: LEFT HEART CATHETERIZATION WITH CORONARY ANGIOGRAM;  Surgeon: [name], MD;  Location: [facility] CATH LAB;  Service: Cardiovascular;  Laterality: N/A;    Family History  Problem Relation Age of Onset  . Hypertension Mother   . Cancer Mother     brain (  possibly started in lung)  . Heart disease Mother   . Diabetes Mother   . Other Mother     cardiomegaly  . Hypertension Father   . Emphysema Father   . Heart Problems Brother     pacemaker  . Heart Problems Other     "using 10% of heart"    Allergies  Allergen Reactions  . Lactose Intolerance (Gi) Hives and Nausea And Vomiting    Current Outpatient Prescriptions on File Prior to Visit  Medication Sig Dispense Refill  . albuterol (VENTOLIN HFA) 108 (90 Base) MCG/ACT inhaler INHALE 2 PUFFS INTO THE LUNGS EVERY 6 (SIX) HOURS AS NEEDED FOR WHEEZING OR SHORTNESS OF BREATH. 18 Inhaler 0  . amLODipine (NORVASC) 10 MG tablet TAKE 1 TABLET (10 MG TOTAL) BY MOUTH DAILY. 90 tablet 1  .  aspirin 81 MG tablet Take 81 mg by mouth daily.     Marland Kitchen azelastine (ASTELIN) 0.1 % nasal spray Place 2 sprays into both nostrils 2 (two) times daily. Use in each nostril as directed 30 mL 3  . carvedilol (COREG) 6.25 MG tablet TAKE 1 TABLET (6.25 MG TOTAL) BY MOUTH 2 (TWO) TIMES DAILY WITH A MEAL. 60 tablet 0  . Cetirizine HCl (ZYRTEC ALLERGY) 10 MG CAPS Take 1 capsule (10 mg total) by mouth daily. 30 capsule 0  . fluticasone (FLONASE) 50 MCG/ACT nasal spray Place 2 sprays into both nostrils daily. 16 g 2  . furosemide (LASIX) 20 MG tablet Take 1 tablet (20 mg total) by mouth daily. 90 tablet 1  . lisinopril (PRINIVIL,ZESTRIL) 20 MG tablet Take 1 tablet (20 mg total) by mouth daily. 90 tablet 1  . omeprazole (PRILOSEC) 20 MG capsule Take 1 capsule (20 mg total) by mouth daily. 90 capsule 1  . rosuvastatin (CRESTOR) 10 MG tablet TAKE 1 TABLET (10 MG TOTAL) BY MOUTH DAILY. 90 tablet 1  . sildenafil (VIAGRA) 25 MG tablet Take 1 tablet (25 mg total) by mouth daily as needed for erectile dysfunction (1 tab po as needed prior to sex. max 1 tab po q day). 10 tablet 0  . tamsulosin (FLOMAX) 0.4 MG CAPS capsule TAKE 1 CAPSULE (0.4 MG TOTAL) BY MOUTH DAILY. 90 capsule 1   No current facility-administered medications on file prior to visit.     Ht 6\' 4"  (1.93 m)   Wt 218 lb (98.9 kg)   BMI 26.54 kg/m        Objective:   Physical Exam  General Mental Status- Alert. General Appearance- Not in acute distress.   Skin General: Color- Normal Color. Moisture- Normal Moisture.  Neck Carotid Arteries- Normal color. Moisture- Normal Moisture. No carotid bruits. No JVD.  Chest and Lung Exam Auscultation: Breath Sounds:-Normal.  Cardiovascular Auscultation:Rythm- Regular. Murmurs & Other Heart Sounds:Auscultation of the heart reveals- No Murmurs.  Abdomen Inspection:-Inspeection Normal. Palpation/Percussion:Note:No mass. Palpation and Percussion of the abdomen reveal- Non Tender, Non Distended +  BS, no rebound or guarding.    Neurologic Cranial Nerve exam:- CN III-XII intact(No nystagmus), symmetric smile. Drift Test:- No drift. Finger to Nose:- Normal/Intact Strength:- 5/5 equal and symmetric strength both upper and lower extremities.  Lower ext- no pedal edema. Negative homans signs.      Assessment & Plan:  Your bp is much improved today. Continue your currrent regimen. Refilled carvedilol today.  Your lipids are well controlled. Continue the crestor.  For fatigue am adding future b12, vit d level, cbc and tsh.  For ED did rx viagra as you  note cardiologist told you it was ok.  Follow up in 3 months or as needed  Lashanti Chambless, Percell Miller, [employer]

## 2016-03-21 ENCOUNTER — Ambulatory Visit (INDEPENDENT_AMBULATORY_CARE_PROVIDER_SITE_OTHER): Payer: Medicare Other

## 2016-03-21 ENCOUNTER — Ambulatory Visit: Payer: Medicare Other | Admitting: Medical

## 2016-03-21 DIAGNOSIS — I5022 Chronic systolic (congestive) heart failure: Secondary | ICD-10-CM | POA: Diagnosis not present

## 2016-03-21 DIAGNOSIS — Z95810 Presence of automatic (implantable) cardiac defibrillator: Secondary | ICD-10-CM

## 2016-03-21 NOTE — Progress Notes (Signed)
EPIC Encounter for ICM Monitoring  Patient Name: Phillip Booth is a 66 y.o. male Date: 03/21/2016 Primary Care Physican: Elise Benne Primary Cardiologist:Allred  Electrophysiologist: Allred Dry Weight:unknown Bi-V Pacing: >99%                                                             Transmission reviewed.   Thoracic impedance normal   Prescribed and confirmed dosage: Furosemide 20 mg 1 tablet daily  Recommendations:  No changes. Reminded to limit dietary salt intake to 2000 mg/day and fluid intake to < 2 liters/day. Encouraged to call for fluid symptoms.  Follow-up plan: ICM clinic phone appointment on 04/24/2016.  Copy of ICM check sent to device physician.   3 month ICM trend: 03/21/2016   1 Year ICM trend:      Rosalene Billings, RN 03/21/2016 6:38 PM

## 2016-03-29 MED FILL — CARVEDILOL 6.25 MG TABLET: 6.25 | 30 days supply | Qty: 60 | Fill #0

## 2016-03-29 MED FILL — SILDENAFIL 20 MG TABLET: 20 | 15 days supply | Qty: 30 | Fill #0

## 2016-04-24 ENCOUNTER — Ambulatory Visit (INDEPENDENT_AMBULATORY_CARE_PROVIDER_SITE_OTHER): Payer: Medicare Other

## 2016-04-24 DIAGNOSIS — I5022 Chronic systolic (congestive) heart failure: Secondary | ICD-10-CM | POA: Diagnosis not present

## 2016-04-24 DIAGNOSIS — Z95810 Presence of automatic (implantable) cardiac defibrillator: Secondary | ICD-10-CM

## 2016-04-24 NOTE — Progress Notes (Signed)
EPIC Encounter for ICM Monitoring  Patient Name: Phillip Booth is a 66 y.o. male Date: 04/24/2016 Primary Care Physican: Elise Benne Primary Cardiologist:Allred  Electrophysiologist: Allred Dry Weight:unknown Bi-V Pacing: >99%      Heart Failure questions reviewed, pt asymptomatic.   Thoracic impedance normal.  Prescribed dosage: Furosemide 20 mg 1 tablet daily  Recommendations: No changes. Discussed to limit salt intake to 2000 mg/day and fluid intake to < 2 liters/day.  Encouraged to call for fluid symptoms.  Follow-up plan: ICM clinic phone appointment on 05/25/2016.    Copy of ICM check sent to device physician.   3 month ICM trend: 04/24/2016   1 Year ICM trend:      Rosalene Billings, RN 04/24/2016 9:30 AM

## 2016-05-19 MED FILL — CARVEDILOL 6.25 MG TABLET: 6.25 | 30 days supply | Qty: 60 | Fill #1

## 2016-05-19 MED FILL — TAMSULOSIN HCL 0.4 MG CAP: 0.4 | 90 days supply | Qty: 90 | Fill #0

## 2016-05-19 MED FILL — FUROSEMIDE 20 MG TABLET: 20 | 90 days supply | Qty: 90 | Fill #0

## 2016-05-19 MED FILL — ROSUVASTATIN CALCIUM 10 MG: 10 | 30 days supply | Qty: 30 | Fill #0

## 2016-05-19 MED FILL — FLUTICASONE PROP 50 MCG SPR: 50 | 30 days supply | Qty: 16 | Fill #0

## 2016-05-22 MED FILL — VENTOLIN HFA 90 MCG INHALER: 108 (90 BAS | 25 days supply | Qty: 18 | Fill #0

## 2016-05-25 ENCOUNTER — Ambulatory Visit (INDEPENDENT_AMBULATORY_CARE_PROVIDER_SITE_OTHER): Payer: Medicare Other

## 2016-05-25 ENCOUNTER — Telehealth: Payer: Self-pay | Admitting: Cardiology

## 2016-05-25 DIAGNOSIS — Z95810 Presence of automatic (implantable) cardiac defibrillator: Secondary | ICD-10-CM | POA: Diagnosis not present

## 2016-05-25 DIAGNOSIS — I5022 Chronic systolic (congestive) heart failure: Secondary | ICD-10-CM

## 2016-05-25 NOTE — Telephone Encounter (Signed)
LMOVM reminding pt to send remote transmission.   

## 2016-05-26 NOTE — Progress Notes (Signed)
EPIC Encounter for ICM Monitoring  Patient Name: Phillip Booth is a 66 y.o. male Date: 05/26/2016 Primary Care Physican: Elise Benne Primary Cardiologist:Allred  Electrophysiologist: Allred Dry Weight:unknown Bi-V Pacing: >99%         Heart Failure questions reviewed, pt asymptomatic.   Thoracic impedance normal.  Prescribed dosage: Furosemide 20 mg 1 tablet daily  Recommendations: No changes. Discussed to limit salt intake to 2000 mg/day and fluid intake to < 2 liters/day.  Encouraged to call for fluid symptoms or use local ER for any urgent symptoms.  Follow-up plan: ICM clinic phone appointment on 06/26/2016.    Copy of ICM check sent to device physician.   3 month ICM trend: 05/26/2016   1 Year ICM trend:      Rosalene Billings, RN 05/26/2016 8:20 AM

## 2016-05-31 ENCOUNTER — Telehealth: Payer: Self-pay | Admitting: Internal Medicine

## 2016-05-31 NOTE — Telephone Encounter (Signed)
Walk in pt Form-Phillip Booth- Request For Medical Consultation paper dropped off placed in All[facility] doc box.

## 2016-05-31 NOTE — Telephone Encounter (Signed)
Follow Up:    Phillip Booth,from Dr Dr Tandy Gaw  wants to make sure that pt's paperwork is faxed to [phone] Att.Cathty please.

## 2016-06-01 ENCOUNTER — Telehealth: Payer: Self-pay

## 2016-06-01 NOTE — Telephone Encounter (Signed)
Returned call to patient as requested by voice mail that he wanted main office number for Dr Rayann Heman.  Attempted call and no answer.  Left message with [facility] office number on voice mail.

## 2016-06-14 MED FILL — IBUPROFEN 600 MG TABLET: 600 | 5 days supply | Qty: 20 | Fill #0

## 2016-06-14 MED FILL — AMOXICILLIN 875 MG TABLET: 875 | 7 days supply | Qty: 14 | Fill #0

## 2016-06-14 MED FILL — HYDROCODON-APAP 7.5-325: 7.5-325 | 1 days supply | Qty: 2 | Fill #0

## 2016-06-26 ENCOUNTER — Ambulatory Visit (INDEPENDENT_AMBULATORY_CARE_PROVIDER_SITE_OTHER): Payer: Medicare Other

## 2016-06-26 DIAGNOSIS — I5022 Chronic systolic (congestive) heart failure: Secondary | ICD-10-CM

## 2016-06-26 DIAGNOSIS — Z95810 Presence of automatic (implantable) cardiac defibrillator: Secondary | ICD-10-CM | POA: Diagnosis not present

## 2016-06-27 NOTE — Progress Notes (Addendum)
EPIC Encounter for ICM Monitoring  Patient Name: Phillip Booth is a 66 y.o. male Date: 06/27/2016 Primary Care Physican: Elise Benne Primary Cardiologist:Allred  Electrophysiologist: Allred Dry Weight:unknown Bi-V Pacing: >99%  Heart Failure questions reviewed, pt asymptomatic for fluid symptoms.  He reported he feels some chest pressure and some SOB when he walks long distances.  He said this is not new and happens off and on since he has had the device.  Advised to call and make an appointment with Dr Rayann Heman if he feels like he needs to be evaluated.  Advised to go to ER if the symptoms worsen or changes.      Thoracic impedance normal.  Prescribed dosage: Furosemide 20 mg 1 tablet daily  Recommendations: No changes.    Encouraged to call for fluid symptoms.  Follow-up plan: ICM clinic phone appointment on 07/31/2016.    Copy of ICM check sent to device physician.   3 month ICM trend: 06/26/2016   1 Year ICM trend:      Rosalene Billings, RN 06/27/2016 8:16 AM

## 2016-06-28 ENCOUNTER — Other Ambulatory Visit: Payer: Self-pay | Admitting: Medical

## 2016-06-28 MED FILL — CHLORHEXIDINE 0.12% RINSE: 0.12 | 16 days supply | Qty: 473 | Fill #0

## 2016-06-28 MED FILL — CARVEDILOL 6.25 MG TAB: 6.25 | 30 days supply | Qty: 60 | Fill #2

## 2016-07-31 ENCOUNTER — Telehealth: Payer: Self-pay

## 2016-07-31 ENCOUNTER — Ambulatory Visit (INDEPENDENT_AMBULATORY_CARE_PROVIDER_SITE_OTHER): Payer: Medicare Other | Admitting: *Deleted

## 2016-07-31 DIAGNOSIS — I5022 Chronic systolic (congestive) heart failure: Secondary | ICD-10-CM

## 2016-07-31 DIAGNOSIS — Z95810 Presence of automatic (implantable) cardiac defibrillator: Secondary | ICD-10-CM

## 2016-07-31 DIAGNOSIS — I428 Other cardiomyopathies: Secondary | ICD-10-CM

## 2016-07-31 NOTE — Telephone Encounter (Signed)
Remote ICM transmission received.  Attempted patient call and left detailed message regarding transmission and next ICM scheduled for 08/31/2016.  Advised to return call for any fluid symptoms or questions.

## 2016-07-31 NOTE — Progress Notes (Signed)
EPIC Encounter for ICM Monitoring  Patient Name: Phillip Booth is a 66 y.o. male Date: 07/31/2016 Primary Care Physican: Elise Benne Primary Cardiologist:Allred  Electrophysiologist: Allred Dry Weight:unknown Bi-V Pacing: >99%      Attempted call to patient and unable to reach.  Left detailed message regarding transmission.  Transmission reviewed.    Thoracic impedance just below baseline.  Prescribed dosage:Furosemide 20 mg 1 tablet daily   Recommendations: Left voice mail with ICM number and encouraged to call for fluid symptoms.  Follow-up plan: ICM clinic phone appointment on 08/31/2016.    Copy of ICM check sent to device physician.   3 month ICM trend: 07/31/2016   1 Year ICM trend:      Rosalene Billings, RN 07/31/2016 9:40 AM

## 2016-08-03 ENCOUNTER — Encounter: Payer: Self-pay | Admitting: Cardiology

## 2016-08-29 LAB — CUP PACEART REMOTE DEVICE CHECK
Battery Remaining Longevity: 86 mo
Battery Voltage: 2.98 V
Brady Statistic AP VP Percent: 92 %
Brady Statistic AP VS Percent: 1 %
Brady Statistic AS VP Percent: 8.1 %
Brady Statistic AS VS Percent: 1 %
Brady Statistic RA Percent Paced: 92 %
Implantable Lead Implant Date: 20160310
Implantable Lead Location: 753858
Implantable Lead Location: 753859
Implantable Lead Location: 753860
Implantable Pulse Generator Implant Date: 20160310
Lead Channel Impedance Value: 450 Ohm
Lead Channel Impedance Value: 560 Ohm
Lead Channel Impedance Value: 680 Ohm
Lead Channel Pacing Threshold Amplitude: 1 V
Lead Channel Pacing Threshold Amplitude: 1.25 V
Lead Channel Pacing Threshold Amplitude: 1.5 V
Lead Channel Pacing Threshold Pulse Width: 0.5 ms
Lead Channel Sensing Intrinsic Amplitude: 12 mV
Lead Channel Setting Pacing Amplitude: 2 V
Lead Channel Setting Pacing Pulse Width: 0.5 ms
Lead Channel Setting Sensing Sensitivity: 2 mV
MDC IDC LEAD IMPLANT DT: 20160310
MDC IDC LEAD IMPLANT DT: 20160310
MDC IDC MSMT BATTERY REMAINING PERCENTAGE: 95.5 %
MDC IDC MSMT LEADCHNL RA PACING THRESHOLD PULSEWIDTH: 0.5 ms
MDC IDC MSMT LEADCHNL RA SENSING INTR AMPL: 3.5 mV
MDC IDC MSMT LEADCHNL RV PACING THRESHOLD PULSEWIDTH: 0.5 ms
MDC IDC PG SERIAL: 7724805
MDC IDC SESS DTM: 20180723071550
MDC IDC SET LEADCHNL LV PACING AMPLITUDE: 2.5 V
MDC IDC SET LEADCHNL LV PACING PULSEWIDTH: 0.5 ms
MDC IDC SET LEADCHNL RV PACING AMPLITUDE: 2.5 V
Pulse Gen Model: 3242

## 2016-08-31 ENCOUNTER — Ambulatory Visit (INDEPENDENT_AMBULATORY_CARE_PROVIDER_SITE_OTHER): Payer: Medicare Other

## 2016-08-31 DIAGNOSIS — I5022 Chronic systolic (congestive) heart failure: Secondary | ICD-10-CM

## 2016-08-31 DIAGNOSIS — Z95810 Presence of automatic (implantable) cardiac defibrillator: Secondary | ICD-10-CM

## 2016-08-31 NOTE — Progress Notes (Signed)
EPIC Encounter for ICM Monitoring  Patient Name: Phillip Booth is a 66 y.o. male Date: 08/31/2016 Primary Care Physican: [name], PA-C Primary Cardiologist:Allred  Electrophysiologist: Allred Dry Weight:unknown Bi-V Pacing: >99%              Heart Failure questions reviewed, pt asymptomatic    Thoracic impedance normal.  Prescribed dosage: Furosemide 20 mg 1 tablet daily  Labs: 08/31/2016 Creatinine 0.93, BUN 12, Potassium 4.7, Sodium 137  Recommendations: No changes.   Encouraged to call for fluid symptoms.  Follow-up plan: ICM clinic phone appointment on 10/02/2016.    Copy of ICM check sent to Dr. Rayann Heman.   3 month ICM trend: 08/31/2016   1 Year ICM trend:      Rosalene Billings, RN 08/31/2016 9:46 AM

## 2016-09-05 ENCOUNTER — Telehealth: Payer: Self-pay | Admitting: Medical

## 2016-09-05 ENCOUNTER — Other Ambulatory Visit: Payer: Self-pay | Admitting: Medical

## 2016-09-05 MED FILL — CARVEDILOL 6.25 MG TABLET: 6.25 | 30 days supply | Qty: 60 | Fill #3

## 2016-09-05 MED FILL — LISINOPRIL 20 MG TABS: 20 | 90 days supply | Qty: 90 | Fill #0

## 2016-09-05 NOTE — Telephone Encounter (Signed)
Caller name: Relation to YM:EBRA Call back number:[phone] [facility] [place]  Reason for call: pt is needing rx rosuvastatin (CRESTOR) 10 MG tablet, pt is completely out of the meds need asap.

## 2016-09-06 MED ORDER — ROSUVASTATIN CALCIUM 10 MG PO TABS: ORAL_TABLET | ORAL | 0 refills | Status: DC

## 2016-09-06 MED FILL — ROSUVASTATIN CALCIUM 10 MG: 10 | 30 days supply | Qty: 30 | Fill #0

## 2016-09-06 NOTE — Telephone Encounter (Signed)
lvm for pt to call back to schedule

## 2016-09-06 NOTE — Telephone Encounter (Signed)
Pt is due for follow up please call and schedule appointment.  

## 2016-09-25 ENCOUNTER — Ambulatory Visit: Payer: Medicare Other | Admitting: Medical

## 2016-09-28 ENCOUNTER — Encounter: Payer: Self-pay | Admitting: Medical

## 2016-09-28 ENCOUNTER — Ambulatory Visit (INDEPENDENT_AMBULATORY_CARE_PROVIDER_SITE_OTHER): Payer: Medicare Other | Admitting: Medical

## 2016-09-28 VITALS — BP 149/85 | HR 60 | Temp 98.0°F | Resp 16 | Ht 72.0 in | Wt 221.8 lb

## 2016-09-28 DIAGNOSIS — E785 Hyperlipidemia, unspecified: Secondary | ICD-10-CM | POA: Diagnosis not present

## 2016-09-28 DIAGNOSIS — I10 Essential (primary) hypertension: Secondary | ICD-10-CM

## 2016-09-28 DIAGNOSIS — M25571 Pain in right ankle and joints of right foot: Secondary | ICD-10-CM | POA: Diagnosis not present

## 2016-09-28 DIAGNOSIS — J302 Other seasonal allergic rhinitis: Secondary | ICD-10-CM | POA: Diagnosis not present

## 2016-09-28 DIAGNOSIS — G8929 Other chronic pain: Secondary | ICD-10-CM | POA: Diagnosis not present

## 2016-09-28 DIAGNOSIS — M7660 Achilles tendinitis, unspecified leg: Secondary | ICD-10-CM

## 2016-09-28 NOTE — Patient Instructions (Addendum)
For your high blood pressure, please restart your blood pressure medication.   For high cholesterol continue crestor. Will put in cmp and lipid panel future lab.(please get within a week)  For allergies please start zyrtec and flonase. If you need can add astelin.  For ankle pain will get xray. Tylenol for pain. Ace wrap daily. Will refer to sports medicine if you want let me know. Avoid nsaids since it can increase bp level.   Follow up to be determined after lab review.

## 2016-09-28 NOTE — Progress Notes (Signed)
Subjective:    Patient ID: Phillip Booth, male    DOB: 11/15/1950, 66 y.o.   MRN: [id number]  HPI   Pt in for follow up.  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  Pt bp little elevated. But not on med. No cardiac or neurologic signs or symptoms.  Pt also on crestor. Last cholesterol check was good.  Pt not fasting today.   Pt has little nasal congestion, mild sneeze and pnd for past one week.  Pt has mild rt ankle swelling. He associates with driving bus. Some mild pain rt achilles tendon pain. Remember twisting ankles twice in past year.   Review of Systems  Constitutional: Negative for chills, fatigue and fever.  HENT: Positive for congestion, postnasal drip and sneezing. Negative for drooling, ear pain, hearing loss and nosebleeds.   Eyes: Negative for pain and redness.  Respiratory: Negative for cough, chest tightness, shortness of breath and stridor.   Cardiovascular: Negative for chest pain and palpitations.  Gastrointestinal: Negative for abdominal pain, blood in stool, diarrhea, nausea and vomiting.  Endocrine: Negative for polydipsia and polyuria.  Genitourinary: Negative for dysuria, flank pain, frequency, testicular pain and urgency.  Musculoskeletal: Negative for back pain, joint swelling and neck pain.       See history of present illness./Ankle pain.  Skin: Negative for rash.  Neurological: Negative for dizziness, speech difficulty, weakness, light-headedness and numbness.  Hematological: Negative for adenopathy. Does not bruise/bleed easily.  Psychiatric/Behavioral: Negative for behavioral problems, confusion, hallucinations and suicidal ideas. The patient is not nervous/anxious.    Past Medical History:  Diagnosis Date  . Allergy   . CAD (coronary artery disease)    a. moderate by cath 03/2014  . Cataract   . CVA (cerebral infarction)    a. diagnosis not  clear  . Hyperlipidemia   . Hypertension   . Non-ischemic cardiomyopathy ([place])    a. EF 45% by echo 03/2014  . Paroxysmal atrial fibrillation ([place]) 05/01/2014   asymptomatic, documented on PPM interrogation,  chads2vasc score is at least 6.  He is contemplating anticoagulation  . Symptomatic bradycardia    a. s/p STJ CRTP implanted by Dr Rayann Heman  . Vertigo      Social History   Social History  . Marital status: Married    Spouse name: N/A  . Number of children: N/A  . Years of education: N/A   Occupational History  . Not on file.   Social History Main Topics  . Smoking status: Never Smoker  . Smokeless tobacco: Never Used  . Alcohol use Yes     Comment: occasional  . Drug use: No  . Sexual activity: Yes   Other Topics Concern  . Not on file   Social History Narrative  . No narrative on file    Past Surgical History:  Procedure Laterality Date  . ABDOMINAL SURGERY    . APPENDECTOMY    . BI-VENTRICULAR PACEMAKER INSERTION N/A 03/19/2014   STJ CRTP implanted by Dr Rayann Heman  . EYE SURGERY     Cataract removed from Right eye  . HERNIA REPAIR    . LEFT HEART CATHETERIZATION WITH CORONARY ANGIOGRAM N/A 03/19/2014   Procedure: LEFT HEART CATHETERIZATION WITH CORONARY ANGIOGRAM;  Surgeon: [name], MD;  Location: [facility] CATH LAB;  Service: Cardiovascular;  Laterality: N/A;    Family History  Problem Relation Age of Onset  .  Hypertension Mother   . Cancer Mother        brain (possibly started in lung)  . Heart disease Mother   . Diabetes Mother   . Other Mother        cardiomegaly  . Hypertension Father   . Emphysema Father   . Heart Problems Brother        pacemaker  . Heart Problems Other        "using 10% of heart"    Allergies  Allergen Reactions  . Lactose Intolerance (Gi) Hives and Nausea And Vomiting    Current Outpatient Prescriptions on File Prior to Visit  Medication Sig Dispense Refill  . albuterol (VENTOLIN HFA) 108 (90 Base) MCG/ACT inhaler  INHALE 2 PUFFS INTO THE LUNGS EVERY 6 (SIX) HOURS AS NEEDED FOR WHEEZING OR SHORTNESS OF BREATH. 18 Inhaler 0  . amLODipine (NORVASC) 10 MG tablet TAKE 1 TABLET (10 MG TOTAL) BY MOUTH DAILY. 90 tablet 1  . aspirin 81 MG tablet Take 81 mg by mouth daily.     Marland Kitchen azelastine (ASTELIN) 0.1 % nasal spray Place 2 sprays into both nostrils 2 (two) times daily. Use in each nostril as directed 30 mL 3  . carvedilol (COREG) 6.25 MG tablet TAKE 1 TABLET (6.25 MG TOTAL) BY MOUTH 2 (TWO) TIMES DAILY WITH A MEAL. 60 tablet 3  . Cetirizine HCl (ZYRTEC ALLERGY) 10 MG CAPS Take 1 capsule (10 mg total) by mouth daily. 30 capsule 0  . fluticasone (FLONASE) 50 MCG/ACT nasal spray Place 2 sprays into both nostrils daily. 16 g 2  . furosemide (LASIX) 20 MG tablet Take 1 tablet (20 mg total) by mouth daily. 90 tablet 1  . lisinopril (PRINIVIL,ZESTRIL) 20 MG tablet Take 1 tablet (20 mg total) by mouth daily. 90 tablet 1  . omeprazole (PRILOSEC) 20 MG capsule Take 1 capsule (20 mg total) by mouth daily. 90 capsule 1  . rosuvastatin (CRESTOR) 10 MG tablet TAKE 1 TABLET (10 MG TOTAL) BY MOUTH DAILY. 30 tablet 0  . sildenafil (REVATIO) 20 MG tablet 1-2 tab po 1 hour prior to sex 50 tablet 0  . sildenafil (VIAGRA) 25 MG tablet 1-2 tab po 1 hour prior to sex. 25 tablet 1  . tamsulosin (FLOMAX) 0.4 MG CAPS capsule TAKE 1 CAPSULE (0.4 MG TOTAL) BY MOUTH DAILY. 90 capsule 1   No current facility-administered medications on file prior to visit.     BP (!) 149/85   Pulse 60   Temp 98 F (36.7 C) (Oral)   Resp 16   Ht 6' (1.829 m)   Wt 221 lb 12.8 oz (100.6 kg)   SpO2 100%   BMI 30.08 kg/m       Objective:   Physical Exam  General Mental Status- Alert. General Appearance- Not in acute distress.   Skin General: Color- Normal Color. Moisture- Normal Moisture.  Neck Carotid Arteries- Normal color. Moisture- Normal Moisture. No carotid bruits. No JVD.  Chest and Lung Exam Auscultation: Breath  Sounds:-Normal.  Cardiovascular Auscultation:Rythm- Regular. Murmurs & Other Heart Sounds:Auscultation of the heart reveals- No Murmurs.  Abdomen Inspection:-Inspeection Normal. Palpation/Percussion:Note:No mass. Palpation and Percussion of the abdomen reveal- Non Tender, Non Distended + BS, no rebound or guarding.   Neurologic Cranial Nerve exam:- CN III-XII intact(No nystagmus), symmetric smile. Drift Test:- No drift. .Finger to Nose:- Normal/Intact Strength:- 5/5 equal and symmetric strength both upper and lower extremities.   HEENT Head- Normal. Ear Auditory Canal - Left- Normal. Right - Normal.Tympanic Membrane-  Left- Normal. Right- Normal. Eye Sclera/Conjunctiva- Left- Normal. Right- Normal. Nose & Sinuses Nasal Mucosa- Left-  Boggy and Congested. Right-  Boggy and  Congested.Bilateral no maxillary and no frontal sinus pressure. Mouth & Throat Lips: Upper Lip- Normal: no dryness, cracking, pallor, cyanosis, or vesicular eruption. Lower Lip-Normal: no dryness, cracking, pallor, cyanosis or vesicular eruption. Buccal Mucosa- Bilateral- No Aphthous ulcers. Oropharynx- No Discharge or Erythema. +pnd.  Tonsils: Characteristics- Bilateral- No Erythema or Congestion. Size/Enlargement- Bilateral- No enlargement. Discharge- bilateral-None.  Rt ankle- moderate swelling. Medial aspect tenderness. Achilles tendon pain on palpation and plantar/dorsiflexion  Right lower extremity-no calf swelling, no pedal edema and negative Homans sign         Assessment & Plan:  For your high blood pressure, please restart your blood pressure medication.   For high cholesterol continue crestor. Will put in cmp and lipid panel future lab.(please get within a week)  For allergies please start zyrtec and flonase. If you need can add astelin.  For ankle pain will get xray. Tylenol for pain. Ace wrap daily. Will refer to sports medicine if you want let me know. Avoid nsaids since it can increase bp  level.   Follow up to be determined after lab review.  Adonica Fukushima, Percell Miller, PA-C

## 2016-10-02 ENCOUNTER — Ambulatory Visit (INDEPENDENT_AMBULATORY_CARE_PROVIDER_SITE_OTHER): Payer: Medicare Other

## 2016-10-02 ENCOUNTER — Other Ambulatory Visit: Payer: Medicare Other

## 2016-10-02 DIAGNOSIS — I5022 Chronic systolic (congestive) heart failure: Secondary | ICD-10-CM | POA: Diagnosis not present

## 2016-10-02 DIAGNOSIS — Z95810 Presence of automatic (implantable) cardiac defibrillator: Secondary | ICD-10-CM

## 2016-10-02 NOTE — Progress Notes (Signed)
EPIC Encounter for ICM Monitoring  Patient Name: Phillip Booth is a 66 y.o. male Date: 10/02/2016 Primary Care Physican: Elise Benne Primary Cardiologist:Allred  Electrophysiologist: Allred Dry Weight:unknown Bi-V Pacing: >99%      Attempted call to patient and unable to reach.  Left detailed message regarding transmission.  Transmission reviewed.    Thoracic impedance normal.  Prescribed dosage: Furosemide 20 mg 1 tablet daily  Labs: 03/06/2016 Creatinine 0.93, BUN 12, Potassium 4.7, Sodium 137, EGFR 104.03  Recommendations: Left voice mail with ICM number and encouraged to call for fluid symptoms.  Follow-up plan: ICM clinic phone appointment on 11/02/2016.   Copy of ICM check sent to Dr. Rayann Heman.   3 month ICM trend: 10/02/2016   1 Year ICM trend:      Rosalene Billings, RN 10/02/2016 9:19 AM

## 2016-10-24 ENCOUNTER — Ambulatory Visit (HOSPITAL_BASED_OUTPATIENT_CLINIC_OR_DEPARTMENT_OTHER)
Admission: RE | Admit: 2016-10-24 | Discharge: 2016-10-24 | Disposition: A | Discharge status: Home / Self Care | Payer: Medicare Other | Source: Ambulatory Visit | Attending: Medical | Admitting: Medical

## 2016-10-24 ENCOUNTER — Other Ambulatory Visit (INDEPENDENT_AMBULATORY_CARE_PROVIDER_SITE_OTHER): Payer: Medicare Other

## 2016-10-24 ENCOUNTER — Other Ambulatory Visit: Payer: Self-pay | Admitting: Medical

## 2016-10-24 DIAGNOSIS — I10 Essential (primary) hypertension: Secondary | ICD-10-CM

## 2016-10-24 DIAGNOSIS — R5383 Other fatigue: Secondary | ICD-10-CM

## 2016-10-24 DIAGNOSIS — E785 Hyperlipidemia, unspecified: Secondary | ICD-10-CM | POA: Diagnosis not present

## 2016-10-24 DIAGNOSIS — M778 Other enthesopathies, not elsewhere classified: Secondary | ICD-10-CM | POA: Diagnosis not present

## 2016-10-24 DIAGNOSIS — E559 Vitamin D deficiency, unspecified: Secondary | ICD-10-CM | POA: Diagnosis not present

## 2016-10-24 DIAGNOSIS — M25571 Pain in right ankle and joints of right foot: Secondary | ICD-10-CM | POA: Insufficient documentation

## 2016-10-24 DIAGNOSIS — G8929 Other chronic pain: Secondary | ICD-10-CM | POA: Diagnosis not present

## 2016-10-25 ENCOUNTER — Encounter: Payer: Self-pay | Admitting: Medical

## 2016-10-25 ENCOUNTER — Telehealth: Payer: Self-pay | Admitting: Medical

## 2016-10-25 DIAGNOSIS — M25571 Pain in right ankle and joints of right foot: Secondary | ICD-10-CM

## 2016-10-25 LAB — LIPID PANEL
CHOLESTEROL: 175 mg/dL (ref 0–200)
HDL: 55.8 mg/dL (ref >39.00)
LDL CALC: 87 mg/dL (ref 0–99)
NONHDL: 119.44
Total CHOL/HDL Ratio: 3
Triglycerides: 160 mg/dL — ABNORMAL HIGH (ref 0.0–149.0)
VLDL: 32 mg/dL (ref 0.0–40.0)

## 2016-10-25 LAB — COMPREHENSIVE METABOLIC PANEL
ALBUMIN: 4 g/dL (ref 3.5–5.2)
ALT: 15 U/L (ref 0–53)
AST: 22 U/L (ref 0–37)
Alkaline Phosphatase: 58 U/L (ref 39–117)
BUN: 13 mg/dL (ref 6–23)
CHLORIDE: 104 meq/L (ref 96–112)
CO2: 23 mEq/L (ref 19–32)
CREATININE: 0.94 mg/dL (ref 0.40–1.50)
Calcium: 9.7 mg/dL (ref 8.4–10.5)
GFR: 102.56 mL/min (ref >60.00)
GLUCOSE: 107 mg/dL — AB (ref 70–99)
POTASSIUM: 3.8 meq/L (ref 3.5–5.1)
SODIUM: 137 meq/L (ref 135–145)
Total Bilirubin: 1 mg/dL (ref 0.2–1.2)
Total Protein: 7.6 g/dL (ref 6.0–8.3)

## 2016-10-25 LAB — CBC WITH DIFFERENTIAL/PLATELET
BASOS ABS: 0.1 10*3/uL (ref 0.0–0.1)
Basophils Relative: 2.5 % (ref 0.0–3.0)
EOS ABS: 0.2 10*3/uL (ref 0.0–0.7)
Eosinophils Relative: 5.1 % — ABNORMAL HIGH (ref 0.0–5.0)
HEMATOCRIT: 44.5 % (ref 39.0–52.0)
HEMOGLOBIN: 14.7 g/dL (ref 13.0–17.0)
LYMPHS PCT: 61.6 % — AB (ref 12.0–46.0)
Lymphs Abs: 2.2 10*3/uL (ref 0.7–4.0)
MCHC: 33 g/dL (ref 30.0–36.0)
MCV: 93.2 fl (ref 78.0–100.0)
MONO ABS: 0.3 10*3/uL (ref 0.1–1.0)
Monocytes Relative: 8 % (ref 3.0–12.0)
NEUTROS ABS: 0.8 10*3/uL — AB (ref 1.4–7.7)
Neutrophils Relative %: 22.8 % — ABNORMAL LOW (ref 43.0–77.0)
PLATELETS: 191 10*3/uL (ref 150.0–400.0)
RBC: 4.77 Mil/uL (ref 4.22–5.81)
RDW: 13.9 % (ref 11.5–15.5)
WBC: 3.6 10*3/uL — AB (ref 4.0–10.5)

## 2016-10-25 LAB — TSH: TSH: 1.68 u[IU]/mL (ref 0.35–4.50)

## 2016-10-25 LAB — VITAMIN B12: Vitamin B-12: 639 pg/mL (ref 211–911)

## 2016-10-25 MED FILL — CARVEDILOL 6.25 MG TABLET: 6.25 | 30 days supply | Qty: 60 | Fill #0

## 2016-10-25 NOTE — Telephone Encounter (Signed)
Referral to podiatrist place.

## 2016-10-27 LAB — VITAMIN D 1,25 DIHYDROXY
VITAMIN D 1, 25 (OH) TOTAL: 79 pg/mL — AB (ref 18–72)
VITAMIN D3 1, 25 (OH): 79 pg/mL
Vitamin D2 1, 25 (OH)2: <8 pg/mL

## 2016-10-31 ENCOUNTER — Ambulatory Visit (HOSPITAL_BASED_OUTPATIENT_CLINIC_OR_DEPARTMENT_OTHER)
Admission: RE | Admit: 2016-10-31 | Discharge: 2016-10-31 | Disposition: A | Discharge status: Home / Self Care | Payer: Medicare Other | Source: Ambulatory Visit | Attending: Podiatry | Admitting: Podiatry

## 2016-10-31 ENCOUNTER — Ambulatory Visit (INDEPENDENT_AMBULATORY_CARE_PROVIDER_SITE_OTHER): Payer: Medicare Other | Admitting: Podiatry

## 2016-10-31 ENCOUNTER — Other Ambulatory Visit (HOSPITAL_BASED_OUTPATIENT_CLINIC_OR_DEPARTMENT_OTHER): Payer: Self-pay | Admitting: Pediatrics

## 2016-10-31 ENCOUNTER — Other Ambulatory Visit: Payer: Self-pay | Admitting: Podiatry

## 2016-10-31 ENCOUNTER — Telehealth: Payer: Self-pay | Admitting: Medical

## 2016-10-31 DIAGNOSIS — R600 Localized edema: Secondary | ICD-10-CM | POA: Diagnosis not present

## 2016-10-31 DIAGNOSIS — R609 Edema, unspecified: Secondary | ICD-10-CM | POA: Diagnosis not present

## 2016-10-31 DIAGNOSIS — R52 Pain, unspecified: Secondary | ICD-10-CM | POA: Diagnosis not present

## 2016-10-31 NOTE — Telephone Encounter (Signed)
Caller name: Relation to HS:JWTG Call back number:[phone] Pharmacy:  Reason for call: pt is calling requesting results from labs done on 10/24/16/ please call

## 2016-11-01 NOTE — Progress Notes (Signed)
Subjective:    Patient ID: Phillip Booth, male   DOB: 66 y.o.   MRN: [id number]   HPI Phillip Booth presents the office if with concerns of right leg, ankle swelling which is been ongoing for the last 3-4 months. 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 He denies any recent injury or trauma. He has no associated pain with swelling. He has no cramping or pain to his legs or calf with walking or rest. Denies any redness or warmth is noticing denies any open sores or drainage. He denies starting any new medications at the time of swelling.he has seen his primary care physician for this as well. He has no other concerns.   Review of Systems  All other systems reviewed and are negative.       Objective:  Physical Exam General: AAO x3, NAD  Dermatological:  There are no open sores, no preulcerative lesions, no rash or signs of infection present.  Vascular: DP pulses 2/4 bilaterally but PT pulses decrease in the right side but this could be due to swelling. There is moderate swelling to the ankle and leg on the right side but there is no assistive erythema or increase in warmth. There is no drainage or open sores identified.  Neruologic: Grossly intact via light touch bilateral. Protective threshold with Semmes Wienstein monofilament intact to all pedal sites bilateral.  Musculoskeletal: There is no area of tenderness identified bilateral lower extremities. Ankle, subtalar joint range of motion intact. Achilles tendon intact.. Muscular strength 5/5 in all groups tested bilateral.  Gait: Unassisted, Nonantalgic.      Assessment:     Right leg swelling    Plan:     -Treatment options discussed including all alternatives, risks, and complications -Etiology of symptoms were discussed -with her to get a venous duplex to rule out DVT. He's been getting this done today. I would also order arterial studies given decreased pulses although  this could be due to swelling.If that is normal consider CT scan of the leg. Elevation and compression. Follow-up with PCP as well. There is no knee or hip pain.   Annice Needy, DPM

## 2016-11-02 ENCOUNTER — Ambulatory Visit (INDEPENDENT_AMBULATORY_CARE_PROVIDER_SITE_OTHER): Payer: Medicare Other | Admitting: *Deleted

## 2016-11-02 ENCOUNTER — Other Ambulatory Visit: Payer: Self-pay

## 2016-11-02 DIAGNOSIS — I5022 Chronic systolic (congestive) heart failure: Secondary | ICD-10-CM | POA: Diagnosis not present

## 2016-11-02 DIAGNOSIS — Z95810 Presence of automatic (implantable) cardiac defibrillator: Secondary | ICD-10-CM

## 2016-11-02 DIAGNOSIS — I495 Sick sinus syndrome: Secondary | ICD-10-CM | POA: Diagnosis not present

## 2016-11-02 MED ORDER — ROSUVASTATIN CALCIUM 10 MG PO TABS: ORAL_TABLET | ORAL | 0 refills | Status: DC

## 2016-11-02 MED FILL — ROSUVASTATIN CALCIUM 10 MG: 10 | 30 days supply | Qty: 30 | Fill #0

## 2016-11-02 NOTE — Telephone Encounter (Signed)
Notified pt. of lap results.

## 2016-11-02 NOTE — Progress Notes (Signed)
EPIC Encounter for ICM Monitoring  Patient Name: Phillip Booth is a 66 y.o. male Date: 11/02/2016 Primary Care Physican: [name], PA-C Primary Cardiologist:Allred  Electrophysiologist: Allred Dry Weight:unknown Bi-V Pacing: >99%            Heart Failure questions reviewed, pt reported some swelling in right foot.  He had an test on the foot was negative for any blood clots and will follow up the foot doctor for swelling.    Thoracic impedance normal.  Prescribed dosage: Furosemide 20 mg 1 tablet daily  Labs: 10/24/2016 Creatinine 0.94, BUN 13, Potassium 3.8, Sodium 137, EGFR 102.56 03/06/2016 Creatinine 0.93, BUN 12, Potassium 4.7, Sodium 137, EGFR 104.03  Recommendations: No changes.   Encouraged to call for fluid symptoms.  Follow-up plan: ICM clinic phone appointment on 12/07/2016.    Copy of ICM check sent to Dr. Rayann Heman.   3 month ICM trend: 11/02/2016   1 Year ICM trend:      Rosalene Billings, RN 11/02/2016 4:52 PM

## 2016-11-02 NOTE — Progress Notes (Signed)
Remote pacemaker transmission.   

## 2016-11-03 ENCOUNTER — Encounter: Payer: Self-pay | Admitting: Cardiology

## 2016-11-03 LAB — CUP PACEART REMOTE DEVICE CHECK
Battery Remaining Longevity: 86 mo
Battery Remaining Percentage: 95.5 %
Battery Voltage: 2.98 V
Brady Statistic AP VP Percent: 94 %
Brady Statistic AP VS Percent: 1 %
Brady Statistic AS VP Percent: 6.1 %
Brady Statistic AS VS Percent: 1 %
Brady Statistic RA Percent Paced: 94 %
Date Time Interrogation Session: 20181025060346
Implantable Lead Implant Date: 20160310
Implantable Lead Implant Date: 20160310
Implantable Lead Implant Date: 20160310
Implantable Lead Location: 753858
Implantable Lead Location: 753859
Implantable Lead Location: 753860
Implantable Lead Model: 1948
Implantable Pulse Generator Implant Date: 20160310
Lead Channel Impedance Value: 460 Ohm
Lead Channel Impedance Value: 560 Ohm
Lead Channel Impedance Value: 680 Ohm
Lead Channel Pacing Threshold Amplitude: 1 V
Lead Channel Pacing Threshold Amplitude: 1.25 V
Lead Channel Pacing Threshold Amplitude: 1.5 V
Lead Channel Pacing Threshold Pulse Width: 0.5 ms
Lead Channel Pacing Threshold Pulse Width: 0.5 ms
Lead Channel Pacing Threshold Pulse Width: 0.5 ms
Lead Channel Sensing Intrinsic Amplitude: 2.6 mV
Lead Channel Sensing Intrinsic Amplitude: 3.4 mV
Lead Channel Setting Pacing Amplitude: 2 V
Lead Channel Setting Pacing Amplitude: 2.5 V
Lead Channel Setting Pacing Amplitude: 2.5 V
Lead Channel Setting Pacing Pulse Width: 0.5 ms
Lead Channel Setting Pacing Pulse Width: 0.5 ms
Lead Channel Setting Sensing Sensitivity: 2 mV
Pulse Gen Model: 3242
Pulse Gen Serial Number: 7724805

## 2016-11-14 ENCOUNTER — Telehealth: Payer: Self-pay | Admitting: Podiatry

## 2016-11-14 DIAGNOSIS — R609 Edema, unspecified: Secondary | ICD-10-CM

## 2016-11-14 NOTE — Telephone Encounter (Signed)
The test was negative for DVT- please have him scheduled to further evaluate. I would try a compression sock for now but we had discussed a CT scan. If he is open to getting that done then we can go ahead and schedule that as well. Val- can you please call him? Thanks.

## 2016-11-14 NOTE — Telephone Encounter (Signed)
Pt said he is concerned that he has not heard anything back since he was seen last. His leg is still swollen and was wondering if you received any results. And what the next step is going to be.

## 2016-11-15 MED FILL — TAMSULOSIN HCL 0.4 MG CAP: 0.4 | 90 days supply | Qty: 90 | Fill #0

## 2016-11-16 NOTE — Telephone Encounter (Addendum)
I informed pt of Dr. Leigh Aurora review of results and that I would be referring him to his cardiologist. Pt states he sees Dr. [name] on [address]. I told pt I would be ordering the CT of the right leg and compression hose from [facility] and mail a copy of the rx to him. Faxed referral, clinicals and demographics to [facility] - Dr. Rayann Heman. Mailed copy of [employer] rx for compression hose to pt. Faxed rx for compression hose to [facility].

## 2016-11-16 NOTE — Addendum Note (Signed)
Addended by: Harriett Sine D on: 11/16/2016 05:17 PM   Modules accepted: Orders

## 2016-11-17 NOTE — Addendum Note (Signed)
Addended by: Harriett Sine D on: 11/17/2016 04:01 PM   Modules accepted: Orders

## 2016-11-17 NOTE — Telephone Encounter (Signed)
CT of the leg please

## 2016-11-17 NOTE — Telephone Encounter (Signed)
Orders faxed to the [facility].

## 2016-11-18 ENCOUNTER — Other Ambulatory Visit: Payer: Self-pay | Admitting: Podiatry

## 2016-11-20 ENCOUNTER — Ambulatory Visit (HOSPITAL_BASED_OUTPATIENT_CLINIC_OR_DEPARTMENT_OTHER): Admission: RE | Admit: 2016-11-20 | Payer: Medicare Other | Source: Ambulatory Visit

## 2016-11-20 ENCOUNTER — Ambulatory Visit (HOSPITAL_BASED_OUTPATIENT_CLINIC_OR_DEPARTMENT_OTHER)
Admission: RE | Admit: 2016-11-20 | Discharge: 2016-11-20 | Disposition: A | Discharge status: Home / Self Care | Payer: Medicare Other | Source: Ambulatory Visit | Attending: Podiatry | Admitting: Podiatry

## 2016-11-20 DIAGNOSIS — M7989 Other specified soft tissue disorders: Secondary | ICD-10-CM | POA: Diagnosis not present

## 2016-11-20 DIAGNOSIS — M1711 Unilateral primary osteoarthritis, right knee: Secondary | ICD-10-CM | POA: Diagnosis not present

## 2016-11-20 DIAGNOSIS — R609 Edema, unspecified: Secondary | ICD-10-CM | POA: Diagnosis not present

## 2016-11-20 DIAGNOSIS — M7121 Synovial cyst of popliteal space [Baker], right knee: Secondary | ICD-10-CM | POA: Insufficient documentation

## 2016-11-21 ENCOUNTER — Telehealth: Payer: Self-pay | Admitting: *Deleted

## 2016-11-21 NOTE — Telephone Encounter (Signed)
-----   Message from Trula Slade, DPM sent at 11/20/2016  5:09 PM EST ----- Negative- please schedule follow-up to discuss further treatments.

## 2016-11-21 NOTE — Telephone Encounter (Signed)
Left message to call for an appt to discuss CT results and treatment.

## 2016-11-22 ENCOUNTER — Ambulatory Visit: Payer: Medicare Other | Admitting: Cardiology

## 2016-11-28 ENCOUNTER — Ambulatory Visit: Payer: Medicare Other

## 2016-12-05 ENCOUNTER — Encounter: Payer: Self-pay | Admitting: Podiatry

## 2016-12-05 ENCOUNTER — Ambulatory Visit (INDEPENDENT_AMBULATORY_CARE_PROVIDER_SITE_OTHER): Payer: Medicare Other | Admitting: Podiatry

## 2016-12-05 DIAGNOSIS — R0989 Other specified symptoms and signs involving the circulatory and respiratory systems: Secondary | ICD-10-CM | POA: Diagnosis not present

## 2016-12-05 DIAGNOSIS — R609 Edema, unspecified: Secondary | ICD-10-CM

## 2016-12-06 ENCOUNTER — Telehealth: Payer: Self-pay | Admitting: *Deleted

## 2016-12-06 DIAGNOSIS — R0989 Other specified symptoms and signs involving the circulatory and respiratory systems: Secondary | ICD-10-CM

## 2016-12-06 NOTE — Telephone Encounter (Signed)
-----   Message from Trula Slade, DPM sent at 12/05/2016  9:58 AM EST ----- Can you please order arterial studies due to decreased pulses? Thanks.

## 2016-12-06 NOTE — Telephone Encounter (Signed)
orders faxed to [facility].

## 2016-12-07 ENCOUNTER — Ambulatory Visit (INDEPENDENT_AMBULATORY_CARE_PROVIDER_SITE_OTHER): Payer: Medicare Other

## 2016-12-07 DIAGNOSIS — I5022 Chronic systolic (congestive) heart failure: Secondary | ICD-10-CM | POA: Diagnosis not present

## 2016-12-07 DIAGNOSIS — Z95810 Presence of automatic (implantable) cardiac defibrillator: Secondary | ICD-10-CM

## 2016-12-07 NOTE — Progress Notes (Signed)
Subjective: Phillip Booth presents the office today for follow-up evaluation discussed CT scan results.  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  He states his symptoms have improved but it does continue. Denies any systemic complaints such as fevers, chills, nausea, vomiting. No acute changes since last appointment, and no other complaints at this time.   Objective: AAO x3, NAD DP pulse 2/4, PT pulse 1/4.  There does appear to be some discoloration darkened discoloration to the foot however there is warmth of the foot of normal temperature gradient. Mild swelling to the right leg but overall does appear to be improving he also states that it is improved.  There is no area of tenderness identified at this time.  Ankle, subtalar joint range of motion intact. No areas of pinpoint bony tenderness or pain with vibratory sensation. MMT 5/5, ROM WNL. No edema, erythema, increase in warmth to bilateral lower extremities.  No open lesions or pre-ulcerative lesions.  No pain with calf compression, swelling, warmth, erythema  Assessment: Continue swelling right foot with decreased pulses  Plan: -All treatment options discussed with the patient including all alternatives, risks, complications.  -Patient had a venous duplex which is negative for DVT.  Also get a CT scan of the leg performed which did not reveal any acute bony abnormality.  There is nonspecific edema in the right leg there is arthritic changes of the knee with a small Baker's cyst.  He denies any pain in the right discussed with him that there is arthritis and should he have symptoms he should follow-up with orthopedics or sports medicine. -Given the decreased pulses in swelling and skin discoloration of ordered arterial studies -Follow-up after vascular studies or sooner if needed. -Patient encouraged to call the office with any questions, concerns,  change in symptoms.    Phillip Booth DPM

## 2016-12-07 NOTE — Progress Notes (Signed)
EPIC Encounter for ICM Monitoring  Patient Name: Phillip Booth is a 66 y.o. male Date: 12/07/2016 Primary Care Physican: Elise Benne Primary Cardiologist:Allred  Electrophysiologist: Allred Dry Weight:unknown Bi-V Pacing: >99%      Attempted call to patient and unable to reach.  Left detailed message regarding transmission.  Transmission reviewed.    Thoracic impedance normal.  Prescribed dosage: Furosemide 20 mg 1 tablet daily  Labs: 10/24/2016 Creatinine 0.94, BUN 13, Potassium 3.8, Sodium 137, EGFR 102.56 03/06/2016 Creatinine 0.93, BUN 12, Potassium 4.7, Sodium 137, EGFR 104.03  Recommendations: Left voice mail with ICM number and encouraged to call if experiencing any fluid symptoms.  Follow-up plan: ICM clinic phone appointment on 01/11/2017.   Copy of ICM check sent to Dr. Rayann Heman.   3 month ICM trend: 12/07/2016    1 Year ICM trend:       Rosalene Billings, RN 12/07/2016 4:25 PM

## 2016-12-10 IMAGING — DX DG CHEST 2V
2 series · 2 of 2 positions shown · non-contrast
Comparison: PA and lateral chest x-ray December 17, 2014

CLINICAL DATA: One 2 weeks of cough, history of coronary artery
disease and previous MI.

EXAM:
CHEST  2 VIEW

[chest pa]
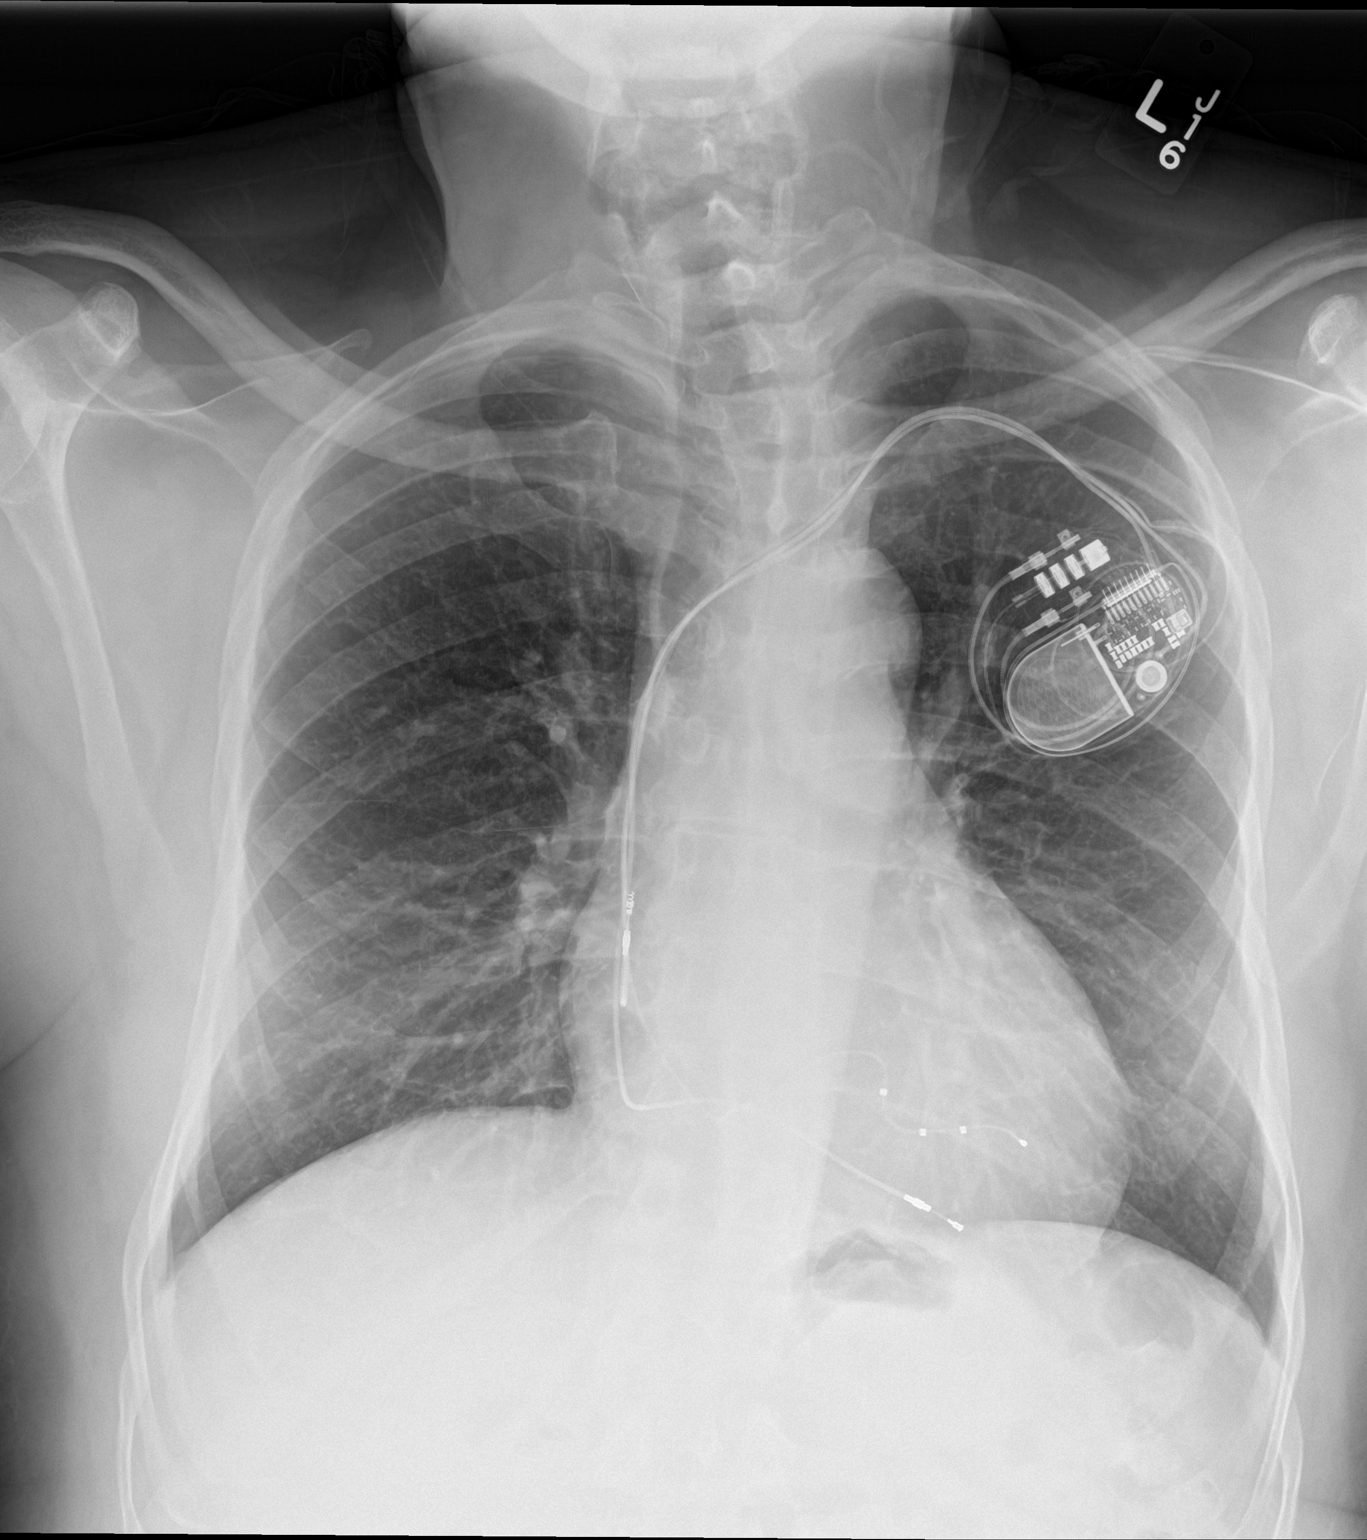

[chest lat]
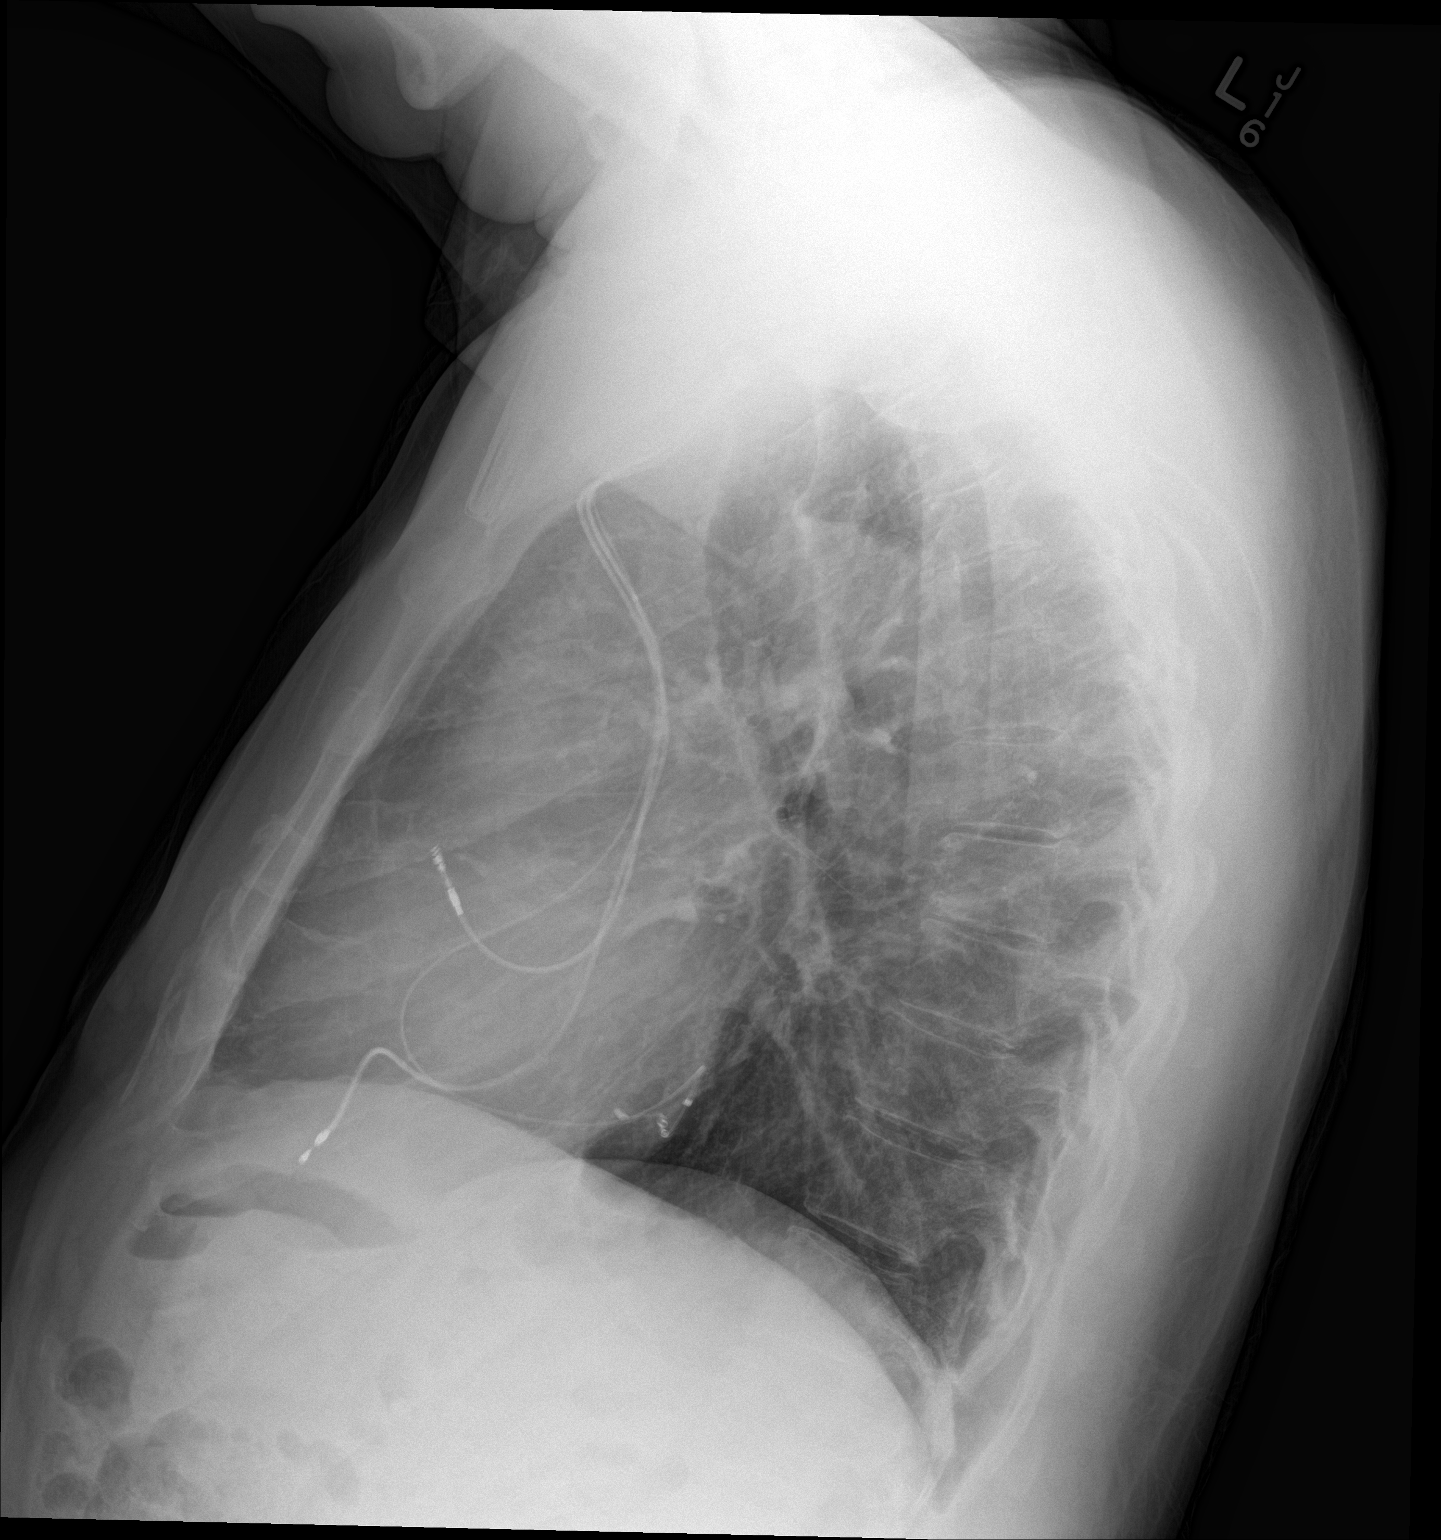

[2 of 2 positions shown; findings below may reference images not displayed]

FINDINGS: The lungs are well-expanded and clear. The interstitial markings are
coarse bilaterally. The heart and pulmonary vascularity are normal.
The mediastinum is normal in width. There is no pleural effusion.
The permanent pacemaker is in stable position. The bony thorax is
unremarkable.
IMPRESSION: Mild chronic bronchitic changes. There is no acute cardiopulmonary
abnormality.

## 2016-12-13 ENCOUNTER — Ambulatory Visit (INDEPENDENT_AMBULATORY_CARE_PROVIDER_SITE_OTHER): Payer: Medicare Other

## 2016-12-13 DIAGNOSIS — Z23 Encounter for immunization: Secondary | ICD-10-CM

## 2016-12-13 MED FILL — CARVEDILOL 6.25 MG TABLET: 6.25 | 30 days supply | Qty: 60 | Fill #1

## 2016-12-15 ENCOUNTER — Telehealth: Payer: Self-pay | Admitting: Medical

## 2016-12-15 NOTE — Telephone Encounter (Signed)
FYI: Referral from [facility]: Please overview - Per conversation w/ patient on Friday @ 1:15PM.   Thanks

## 2016-12-20 ENCOUNTER — Telehealth: Payer: Self-pay | Admitting: Medical

## 2016-12-20 ENCOUNTER — Other Ambulatory Visit: Payer: Self-pay | Admitting: Podiatry

## 2016-12-20 DIAGNOSIS — R0989 Other specified symptoms and signs involving the circulatory and respiratory systems: Secondary | ICD-10-CM

## 2016-12-20 NOTE — Telephone Encounter (Signed)
It was arterial duplex/ABI. Looks like it has been ordered by our office and is schedule for the 19th. If there are any issues, please let me know and we will take care of it. Sorry he was asking you about it but I will have our office call to let him know the time and place.   Valeryl- can you please call [name] to follow-up that he knows when and where to go for the test? Thanks.

## 2016-12-20 NOTE — Telephone Encounter (Signed)
Will you call pt and let him know I sent message to Dr. Jacqualyn Posey as to what test he was wanting to order. So when I get word will let him know.

## 2016-12-20 NOTE — Telephone Encounter (Signed)
Dr. Jacqualyn Posey,  Pt dropped by our office the other day asking about a study you were going to order. On review of pt chart not sure what test that might be. ABI? Or other? I saw various test that you already ordered. If you let me know I will try to order. Thanks for your help with our patients.  Thanks,  Architect, Percell Miller, PA-C

## 2016-12-21 NOTE — Telephone Encounter (Signed)
Left message for pt to call for the location and times of his vascular studies.

## 2016-12-22 NOTE — Telephone Encounter (Signed)
Left message informing pt that due to the importance of the information in needed to give him I was going to leave a message on his voicemail, appt 12/27/2016 at [place], appt 1:30pm.

## 2016-12-27 ENCOUNTER — Inpatient Hospital Stay (HOSPITAL_COMMUNITY): Admission: RE | Admit: 2016-12-27 | Payer: Medicare Other | Source: Ambulatory Visit

## 2016-12-29 ENCOUNTER — Other Ambulatory Visit: Payer: Self-pay

## 2016-12-29 MED ORDER — LISINOPRIL 20 MG PO TABS: 20.0000 mg | ORAL_TABLET | Freq: Every day | ORAL | 1 refills | Status: DC

## 2017-01-10 ENCOUNTER — Ambulatory Visit (HOSPITAL_COMMUNITY)
Admission: RE | Admit: 2017-01-10 | Discharge: 2017-01-10 | Disposition: A | Discharge status: Home / Self Care | Payer: Medicare Other | Source: Ambulatory Visit | Attending: Internal Medicine | Admitting: Internal Medicine

## 2017-01-10 DIAGNOSIS — R0989 Other specified symptoms and signs involving the circulatory and respiratory systems: Secondary | ICD-10-CM

## 2017-01-11 ENCOUNTER — Ambulatory Visit (INDEPENDENT_AMBULATORY_CARE_PROVIDER_SITE_OTHER): Payer: Medicare Other

## 2017-01-11 DIAGNOSIS — Z95810 Presence of automatic (implantable) cardiac defibrillator: Secondary | ICD-10-CM | POA: Diagnosis not present

## 2017-01-11 DIAGNOSIS — I5022 Chronic systolic (congestive) heart failure: Secondary | ICD-10-CM

## 2017-01-12 NOTE — Progress Notes (Signed)
EPIC Encounter for ICM Monitoring  Patient Name: Phillip Booth is a 67 y.o. male Date: 01/12/2017 Primary Care Physican: Elise Benne Primary Cardiologist:Allred  Electrophysiologist: Allred Dry Weight:unknown Bi-V Pacing: >99%       Heart Failure questions reviewed, pt asymptomatic.   Thoracic impedance normal.  Prescribed dosage: Furosemide 20 mg 1 tablet daily  Labs: 10/24/2016 Creatinine 0.94, BUN 13, Potassium 3.8, Sodium 137, EGFR 102.56 03/06/2016 Creatinine 0.93, BUN 12, Potassium 4.7, Sodium 137, EGFR 104.03  Recommendations: No changes.   Encouraged to call for fluid symptoms.  Follow-up plan: ICM clinic phone appointment on 02/12/2017.  Office appointment scheduled 02/19/2017 with Dr. Rayann Heman.  Copy of ICM check sent to Dr. Rayann Heman.   3 month ICM trend: 01/11/2017    1 Year ICM trend:       Rosalene Billings, RN 01/12/2017 8:12 AM

## 2017-01-22 ENCOUNTER — Telehealth: Payer: Self-pay | Admitting: *Deleted

## 2017-01-22 NOTE — Telephone Encounter (Signed)
-----   Message from Trula Slade, DPM sent at 01/19/2017  4:33 PM EST ----- Val- please let him know that the arterial studies are normal. There is some decreased circulation to the toes but that is not causing the swelling. I did reach out to his PCP to let him know what was going on. I do not think the swelling is from his foot itself. Would recommend to follow-up with his PCP.

## 2017-01-22 NOTE — Telephone Encounter (Signed)
Left message informing pt Dr.Wagoner had reviewed his results and to call and I would go over them with him.

## 2017-01-24 ENCOUNTER — Other Ambulatory Visit: Payer: Self-pay | Admitting: Nurse Practitioner

## 2017-01-24 MED FILL — LISINOPRIL 20 MG TABLET: 20 | 90 days supply | Qty: 90 | Fill #1

## 2017-01-24 MED FILL — ROSUVASTATIN CALCIUM 10 MG: 10 | 30 days supply | Qty: 30 | Fill #1

## 2017-01-24 MED FILL — CARVEDILOL 6.25 MG TABLET: 6.25 | 30 days supply | Qty: 60 | Fill #2

## 2017-01-24 MED FILL — FUROSEMIDE 20 MG TAB: 20 | 90 days supply | Qty: 90 | Fill #0

## 2017-01-25 ENCOUNTER — Telehealth: Payer: Self-pay | Admitting: *Deleted

## 2017-01-25 NOTE — Telephone Encounter (Signed)
Pt called for results of circulation test.

## 2017-01-25 NOTE — Telephone Encounter (Signed)
I informed pt of Dr. Leigh Aurora review of results and recommendation to continue care for the swelling of his toe with his PCP. 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 I told him I would speak with Dr. Jacqualyn Posey and have him communicate with his PCP Dr. Ainsley Spinner at [facility].

## 2017-01-25 NOTE — Telephone Encounter (Signed)
-----   Message from Trula Slade, DPM sent at 01/19/2017  4:33 PM EST ----- Val- please let him know that the arterial studies are normal. There is some decreased circulation to the toes but that is not causing the swelling. I did reach out to his PCP to let him know what was going on. I do not think the swelling is from his foot itself. Would recommend to follow-up with his PCP.

## 2017-01-29 ENCOUNTER — Encounter: Payer: Self-pay | Admitting: Medical

## 2017-01-29 ENCOUNTER — Ambulatory Visit: Payer: Medicare Other | Admitting: Medical

## 2017-01-29 DIAGNOSIS — Z0289 Encounter for other administrative examinations: Secondary | ICD-10-CM

## 2017-02-07 ENCOUNTER — Telehealth: Payer: Self-pay

## 2017-02-07 ENCOUNTER — Ambulatory Visit (INDEPENDENT_AMBULATORY_CARE_PROVIDER_SITE_OTHER): Payer: Medicare Other | Admitting: Medical

## 2017-02-07 ENCOUNTER — Encounter: Payer: Self-pay | Admitting: Medical

## 2017-02-07 VITALS — BP 140/80 | HR 60 | Temp 98.0°F | Resp 16 | Ht 72.0 in | Wt 222.4 lb

## 2017-02-07 DIAGNOSIS — J3089 Other allergic rhinitis: Secondary | ICD-10-CM

## 2017-02-07 DIAGNOSIS — R0981 Nasal congestion: Secondary | ICD-10-CM | POA: Diagnosis not present

## 2017-02-07 DIAGNOSIS — M25471 Effusion, right ankle: Secondary | ICD-10-CM | POA: Diagnosis not present

## 2017-02-07 DIAGNOSIS — R062 Wheezing: Secondary | ICD-10-CM

## 2017-02-07 MED ORDER — BECLOMETHASONE DIPROP HFA 40 MCG/ACT IN AERB: 2.0000 | INHALATION_SPRAY | Freq: Two times a day (BID) | RESPIRATORY_TRACT | 0 refills | Status: DC

## 2017-02-07 MED ORDER — ALBUTEROL SULFATE HFA 108 (90 BASE) MCG/ACT IN AERS: 2.0000 | INHALATION_SPRAY | Freq: Four times a day (QID) | RESPIRATORY_TRACT | 2 refills | Status: DC | PRN

## 2017-02-07 MED ORDER — AZELASTINE HCL 0.1 % NA SOLN: 2.0000 | Freq: Two times a day (BID) | NASAL | 3 refills | Status: DC

## 2017-02-07 MED ORDER — FLUTICASONE PROPIONATE 50 MCG/ACT NA SUSP: 2.0000 | Freq: Every day | NASAL | 2 refills | Status: DC

## 2017-02-07 MED FILL — VENTOLIN HFA 90 MCG INHALER: 108 (90 BAS | 25 days supply | Qty: 18 | Fill #0

## 2017-02-07 NOTE — Telephone Encounter (Signed)
PA initiated via Covermymeds; KEY: [id number]. Awaiting determination.

## 2017-02-07 NOTE — Progress Notes (Signed)
Subjective:    Patient ID: Phillip Booth, male    DOB: May 14, 1950, 67 y.o.   MRN: [id number]  HPI   Pt in for follow up on his lower leg swelling. Pt had some ankle swelling/faint pain and I had referred him to podiatrist. 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 This is when it was most severe.  Pt had negative [name], negative CT of lower ext and negative ABI.  Pt does note some nasal congestion, pnd and some sneezing. He mentions in past years winter and spring notices this. No sinus pressure.   Sometimes notices transient shortness of breath and wheezing that he associates with  the above upper respiratory symptoms/allergy like symptoms. .Pt in past had used albuterol and noticed it did help. Pt had no hx of smoking but did get second hand smoke through dad. 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   Also recently persons below him in apartment are heavy smoke. He can smell smoke near his apartment.   Review of Systems  Constitutional: Negative for chills, fatigue and fever.  HENT: Positive for congestion, postnasal drip and sneezing. Negative for ear pain, tinnitus and trouble swallowing.   Respiratory: Negative for cough, chest tightness, shortness of breath and wheezing.   Cardiovascular: Negative for chest pain and palpitations.  Gastrointestinal: Negative for abdominal pain, blood in stool, diarrhea and vomiting.  Genitourinary: Negative for decreased urine volume, difficulty urinating, enuresis, flank pain, frequency, genital sores and scrotal swelling.  Musculoskeletal: Negative for gait problem, joint swelling, neck pain and neck stiffness.       Rt ankle mild swelling.  Skin: Negative for rash.  Neurological: Negative for dizziness, speech difficulty, weakness, light-headedness, numbness and headaches.  Hematological: Negative for adenopathy. Does not bruise/bleed  easily.  Psychiatric/Behavioral: Negative for behavioral problems, confusion, dysphoric mood, hallucinations and suicidal ideas. The patient is not nervous/anxious.    Past Medical History:  Diagnosis Date  . Allergy   . CAD (coronary artery disease)    a. moderate by cath 03/2014  . Cataract   . CVA (cerebral infarction)    a. diagnosis not clear  . Hyperlipidemia   . Hypertension   . Non-ischemic cardiomyopathy ([place])    a. EF 45% by echo 03/2014  . Paroxysmal atrial fibrillation ([place]) 05/01/2014   asymptomatic, documented on PPM interrogation,  chads2vasc score is at least 6.  He is contemplating anticoagulation  . Symptomatic bradycardia    a. s/p STJ CRTP implanted by Dr Rayann Heman  . Vertigo      Social History   Socioeconomic History  . Marital status: Married    Spouse name: Not on file  . Number of children: Not on file  . Years of education: Not on file  . Highest education level: Not on file  Social Needs  . Financial resource strain: Not on file  . Food insecurity - worry: Not on file  . Food insecurity - inability: Not on file  . Transportation needs - medical: Not on file  . Transportation needs - non-medical: Not on file  Occupational History  . Not on file  Tobacco Use  . Smoking status: Never Smoker  . Smokeless tobacco: Never Used  Substance and Sexual Activity  . Alcohol use: Yes    Comment: occasional  . Drug use: No  . Sexual activity: Yes  Other Topics Concern  .  Not on file  Social History Narrative  . Not on file    Past Surgical History:  Procedure Laterality Date  . ABDOMINAL SURGERY    . APPENDECTOMY    . BI-VENTRICULAR PACEMAKER INSERTION N/A 03/19/2014   STJ CRTP implanted by Dr Rayann Heman  . EYE SURGERY     Cataract removed from Right eye  . HERNIA REPAIR    . LEFT HEART CATHETERIZATION WITH CORONARY ANGIOGRAM N/A 03/19/2014   Procedure: LEFT HEART CATHETERIZATION WITH CORONARY ANGIOGRAM;  Surgeon: [name], MD;  Location: [facility] CATH  LAB;  Service: Cardiovascular;  Laterality: N/A;    Family History  Problem Relation Age of Onset  . Hypertension Mother   . Cancer Mother        brain (possibly started in lung)  . Heart disease Mother   . Diabetes Mother   . Other Mother        cardiomegaly  . Hypertension Father   . Emphysema Father   . Heart Problems Brother        pacemaker  . Heart Problems Other        "using 10% of heart"    Allergies  Allergen Reactions  . Lactose Intolerance (Gi) Hives and Nausea And Vomiting    Current Outpatient Medications on File Prior to Visit  Medication Sig Dispense Refill  . albuterol (VENTOLIN HFA) 108 (90 Base) MCG/ACT inhaler INHALE 2 PUFFS INTO THE LUNGS EVERY 6 (SIX) HOURS AS NEEDED FOR WHEEZING OR SHORTNESS OF BREATH. 18 Inhaler 0  . amLODipine (NORVASC) 10 MG tablet TAKE 1 TABLET (10 MG TOTAL) BY MOUTH DAILY. 90 tablet 1  . aspirin 81 MG tablet Take 81 mg by mouth daily.     Marland Kitchen azelastine (ASTELIN) 0.1 % nasal spray Place 2 sprays into both nostrils 2 (two) times daily. Use in each nostril as directed 30 mL 3  . carvedilol (COREG) 6.25 MG tablet TAKE 1 TABLET (6.25 MG TOTAL) BY MOUTH 2 (TWO) TIMES DAILY WITH A MEAL. 60 tablet 3  . Cetirizine HCl (ZYRTEC ALLERGY) 10 MG CAPS Take 1 capsule (10 mg total) by mouth daily. 30 capsule 0  . fluticasone (FLONASE) 50 MCG/ACT nasal spray Place 2 sprays into both nostrils daily. 16 g 2  . furosemide (LASIX) 20 MG tablet TAKE 1 TABLET BY MOUTH DAILY 90 tablet 0  . lisinopril (PRINIVIL,ZESTRIL) 20 MG tablet Take 1 tablet (20 mg total) by mouth daily. 90 tablet 1  . omeprazole (PRILOSEC) 20 MG capsule Take 1 capsule (20 mg total) by mouth daily. 90 capsule 1  . rosuvastatin (CRESTOR) 10 MG tablet TAKE 1 TABLET (10 MG TOTAL) BY MOUTH DAILY. 90 tablet 0  . sildenafil (REVATIO) 20 MG tablet 1-2 tab po 1 hour prior to sex 50 tablet 0  . sildenafil (VIAGRA) 25 MG tablet 1-2 tab po 1 hour prior to sex. 25 tablet 1  . tamsulosin (FLOMAX) 0.4  MG CAPS capsule TAKE 1 CAPSULE (0.4 MG TOTAL) BY MOUTH DAILY. 90 capsule 1   No current facility-administered medications on file prior to visit.     BP (!) 154/87   Pulse 60   Temp 98 F (36.7 C) (Oral)   Resp 16   Ht 6' (1.829 m)   Wt 222 lb 6.4 oz (100.9 kg)   SpO2 100%   BMI 30.16 kg/m       Objective:   Physical Exam  General  Mental Status - Alert. General Appearance - Well groomed. Not in  acute distress.  Skin Rashes- No Rashes.  HEENT Head- Normal. Ear Auditory Canal - Left- Normal. Right - Normal.Tympanic Membrane- Left- Normal. Right- Normal. Eye Sclera/Conjunctiva- Left- Normal. Right- Normal. Nose & Sinuses Nasal Mucosa- Left-  Boggy and Congested. Right-  Boggy and  Congested.Bilateral no  maxillary and no  frontal sinus pressure. Mouth & Throat Lips: Upper Lip- Normal: no dryness, cracking, pallor, cyanosis, or vesicular eruption. Lower Lip-Normal: no dryness, cracking, pallor, cyanosis or vesicular eruption. Buccal Mucosa- Bilateral- No Aphthous ulcers. Oropharynx- No Discharge or Erythema. +pnd. Tonsils: Characteristics- Bilateral- No Erythema or Congestion. Size/Enlargement- Bilateral- No enlargement. Discharge- bilateral-None.  Neck Neck- Supple. No Masses.   Chest and Lung Exam Auscultation: Breath Sounds:-Clear even and unlabored.  Cardiovascular Auscultation:Rythm- Regular, rate and rhythm. Murmurs & Other Heart Sounds:Ausculatation of the heart reveal- No Murmurs.  Lymphatic Head & Neck General Head & Neck Lymphatics: Bilateral: Description- No Localized lymphadenopathy.   Rt lower  ext- symmetric to left side. No swelling. Negative homans sign.  Rt ankle- faint swelling lateral and medial ankle. Very minimal.       Assessment & Plan:  You do appear to have probable allergic rhinitis versus vasomotor rhinitis.  In the past you did well when I prescribed you both Flonase and Astelin nasal spray.  So I refilled that today.  For  your history of wheezing when you walk/ambulate, I am prescribing you Qvar steroid inhaler and albuterol.  Albuterol is the as needed inhaler that you would use every 4-6 hours.  Qvar is inhaler to use daily for prevention.  Recommend starting Qvar if you find that you are having to use albuterol excessively.  After discussion today, it is possible your prior heavy secondhand smoke exposure now contributing to your wheezing.  Your right ankle is chronic really intermittently swollen.  Had  CT lower extremity, negative ultrasound and vascular studies were negative.  I think you have some mild intermittent dependent edema.  However would still be cautious and advise you that if he gets swelling that approaches your calf or any pain behind your knee then we would need to repeat the ultrasound study.  Would recommend TED hose stockings to both legs when you drive long distances or when you have to stand for prolonged periods.  Or you could use just a Ace wrap to your right ankle at times since this is the area that swells worse.  Follow-up in 2-3 months or as needed.  [name], PA-C

## 2017-02-07 NOTE — Patient Instructions (Signed)
  You do appear to have probable allergic rhinitis versus vasomotor rhinitis.  In the past you did well when I prescribed you both Flonase and Astelin nasal spray.  So I refilled that today.  For your history of wheezing when you walk/ambulate, I am prescribing you Qvar steroid inhaler and albuterol.  Albuterol is the as needed inhaler that you would use every 4-6 hours.  Qvar is inhaler to use daily for prevention.  Recommend starting Qvar if you find that you are having to use albuterol excessively.  After discussion today, it is possible your prior heavy secondhand smoke exposure now contributing to your wheezing.  Your right ankle is chronic really intermittently swollen.  Had  CT lower extremity, negative ultrasound and vascular studies were negative.  I think you have some mild intermittent dependent edema.  However would still be cautious and advise you that if he gets swelling that approaches your calf or any pain behind your knee then we would need to repeat the ultrasound study.  Would recommend TED hose stockings to both legs when you drive long distances or when you have to stand for prolonged periods.  Or you could use just a Ace wrap to your right ankle at times since this is the area that swells worse.  Follow-up in 2-3 months or as needed.

## 2017-02-08 MED ORDER — FLUTICASONE PROPIONATE HFA 110 MCG/ACT IN AERO: 2.0000 | INHALATION_SPRAY | Freq: Two times a day (BID) | RESPIRATORY_TRACT | 3 refills | Status: DC

## 2017-02-08 MED FILL — AZELASTINE HCL 137 MCG SPRY: 0.1 | 25 days supply | Qty: 30 | Fill #0

## 2017-02-08 MED FILL — FLUTICASONE PROP 50 MCG SPR: 50 | 30 days supply | Qty: 16 | Fill #0

## 2017-02-08 NOTE — Telephone Encounter (Signed)
PA denied. Pt must have tried all formulary drugs first which are budesonide (generic only of Pulmicort, which will also require PA), Flovent diskus, Flovent HFA, Pulmicort Flexhaler and Arnuity Ellipta. Please advise.

## 2017-02-08 NOTE — Telephone Encounter (Signed)
I sent flovent down to our pharmacy. Accidentally [facility]. Will you call [facility] and cancel the flovent rx.

## 2017-02-12 ENCOUNTER — Ambulatory Visit (INDEPENDENT_AMBULATORY_CARE_PROVIDER_SITE_OTHER): Payer: Medicare Other | Admitting: *Deleted

## 2017-02-12 ENCOUNTER — Telehealth: Payer: Self-pay | Admitting: Medical

## 2017-02-12 DIAGNOSIS — I5022 Chronic systolic (congestive) heart failure: Secondary | ICD-10-CM

## 2017-02-12 DIAGNOSIS — I495 Sick sinus syndrome: Secondary | ICD-10-CM

## 2017-02-12 DIAGNOSIS — Z95810 Presence of automatic (implantable) cardiac defibrillator: Secondary | ICD-10-CM | POA: Diagnosis not present

## 2017-02-12 MED ORDER — FLUTICASONE PROPIONATE 50 MCG/ACT NA SUSP: 2.0000 | Freq: Every day | NASAL | 2 refills | Status: DC

## 2017-02-12 MED ORDER — AZELASTINE HCL 0.1 % NA SOLN: 2.0000 | Freq: Two times a day (BID) | NASAL | 3 refills | Status: DC

## 2017-02-12 NOTE — Telephone Encounter (Signed)
Rx sent to [facility]. 

## 2017-02-12 NOTE — Progress Notes (Signed)
Remote pacemaker transmission.   

## 2017-02-12 NOTE — Telephone Encounter (Signed)
Patient wants to have all RX submit to Pharmacy at the [facility].

## 2017-02-12 NOTE — Progress Notes (Signed)
EPIC Encounter for ICM Monitoring  Patient Name: Phillip Booth is a 67 y.o. male Date: 02/12/2017 Primary Care Physican: Elise Benne Primary Cardiologist:Allred  Electrophysiologist: Allred Dry Weight:unknown Bi-V Pacing: >99%         Heart Failure questions reviewed, pt asymptomatic.   Thoracic impedance slightly below base abnormal suggesting fluid accumulation.  Prescribed dosage: Furosemide 20 mg 1 tablet daily  Labs: 10/24/2016 Creatinine 0.94, BUN 13, Potassium 3.8, Sodium 137, EGFR 102.56 03/06/2016 Creatinine 0.93, BUN 12, Potassium 4.7, Sodium 137, EGFR 104.03  Recommendations: No changes.  Encouraged to call for fluid symptoms.  Follow-up plan: ICM clinic phone appointment on 03/22/2017.  Office appointment scheduled 02/19/2017 with Dr. Rayann Heman.  Copy of ICM check sent to Dr. Rayann Heman.   3 month ICM trend: 02/12/2017    1 Year ICM trend:       Rosalene Billings, RN 02/12/2017 3:13 PM

## 2017-02-13 ENCOUNTER — Encounter: Payer: Self-pay | Admitting: Cardiology

## 2017-02-13 ENCOUNTER — Encounter: Payer: Self-pay | Admitting: Internal Medicine

## 2017-02-19 ENCOUNTER — Ambulatory Visit (INDEPENDENT_AMBULATORY_CARE_PROVIDER_SITE_OTHER): Payer: Medicare Other | Admitting: Internal Medicine

## 2017-02-19 ENCOUNTER — Encounter: Payer: Self-pay | Admitting: Internal Medicine

## 2017-02-19 ENCOUNTER — Telehealth (HOSPITAL_COMMUNITY): Payer: Self-pay | Admitting: *Deleted

## 2017-02-19 VITALS — BP 148/90 | HR 78 | Ht 76.0 in | Wt 220.0 lb

## 2017-02-19 DIAGNOSIS — R0789 Other chest pain: Secondary | ICD-10-CM

## 2017-02-19 DIAGNOSIS — I428 Other cardiomyopathies: Secondary | ICD-10-CM

## 2017-02-19 DIAGNOSIS — I10 Essential (primary) hypertension: Secondary | ICD-10-CM

## 2017-02-19 DIAGNOSIS — I480 Paroxysmal atrial fibrillation: Secondary | ICD-10-CM

## 2017-02-19 DIAGNOSIS — R001 Bradycardia, unspecified: Secondary | ICD-10-CM | POA: Diagnosis not present

## 2017-02-19 LAB — CUP PACEART INCLINIC DEVICE CHECK
Battery Remaining Longevity: 80 mo
Brady Statistic RA Percent Paced: 95 %
Date Time Interrogation Session: 20190211101405
Implantable Lead Implant Date: 20160310
Implantable Lead Location: 753858
Implantable Pulse Generator Implant Date: 20160310
Lead Channel Impedance Value: 462.5 Ohm
Lead Channel Pacing Threshold Amplitude: 0.75 V
Lead Channel Pacing Threshold Amplitude: 0.75 V
Lead Channel Pacing Threshold Amplitude: 0.75 V
Lead Channel Pacing Threshold Amplitude: 1 V
Lead Channel Pacing Threshold Pulse Width: 0.5 ms
Lead Channel Pacing Threshold Pulse Width: 0.5 ms
Lead Channel Sensing Intrinsic Amplitude: 12 mV
Lead Channel Sensing Intrinsic Amplitude: 3.1 mV
Lead Channel Setting Pacing Amplitude: 2 V
Lead Channel Setting Pacing Amplitude: 2.5 V
Lead Channel Setting Pacing Pulse Width: 0.5 ms
Lead Channel Setting Sensing Sensitivity: 2 mV
MDC IDC LEAD IMPLANT DT: 20160310
MDC IDC LEAD IMPLANT DT: 20160310
MDC IDC LEAD LOCATION: 753859
MDC IDC LEAD LOCATION: 753860
MDC IDC MSMT BATTERY VOLTAGE: 2.98 V
MDC IDC MSMT LEADCHNL LV IMPEDANCE VALUE: 687.5 Ohm
MDC IDC MSMT LEADCHNL LV PACING THRESHOLD AMPLITUDE: 0.75 V
MDC IDC MSMT LEADCHNL LV PACING THRESHOLD PULSEWIDTH: 0.5 ms
MDC IDC MSMT LEADCHNL RA PACING THRESHOLD AMPLITUDE: 1 V
MDC IDC MSMT LEADCHNL RA PACING THRESHOLD PULSEWIDTH: 0.5 ms
MDC IDC MSMT LEADCHNL RV IMPEDANCE VALUE: 575 Ohm
MDC IDC MSMT LEADCHNL RV PACING THRESHOLD PULSEWIDTH: 0.5 ms
MDC IDC MSMT LEADCHNL RV PACING THRESHOLD PULSEWIDTH: 0.5 ms
MDC IDC SET LEADCHNL RV PACING AMPLITUDE: 2.5 V
MDC IDC SET LEADCHNL RV PACING PULSEWIDTH: 0.5 ms
MDC IDC STAT BRADY RV PERCENT PACED: 99.92 %
Pulse Gen Model: 3242
Pulse Gen Serial Number: 7724805

## 2017-02-19 NOTE — Progress Notes (Signed)
PCP: [name], PA-C   Primary Cardiologist:  Dr Marlou Porch Primary EP:  Dr Lenon Oms Phillip Booth is a 67 y.o. male who presents today for routine electrophysiology followup.  Since last being seen in our clinic, the patient reports doing very well. He remains active but does not exercise regularly.  He reports chest tightness with moderate exertion.  This is new and worrisome for him.  Today, he denies symptoms of palpitations, shortness of breath,  lower extremity edema, dizziness, presyncope, or syncope.  The patient is otherwise without complaint today.   Past Medical History:  Diagnosis Date  . Allergy   . CAD (coronary artery disease)    a. moderate by cath 03/2014  . Cataract   . CVA (cerebral infarction)    a. diagnosis not clear  . Hyperlipidemia   . Hypertension   . Non-ischemic cardiomyopathy ([place])    a. EF 45% by echo 03/2014  . Paroxysmal atrial fibrillation ([place]) 05/01/2014   asymptomatic, documented on PPM interrogation,  chads2vasc score is at least 6.  He is contemplating anticoagulation  . Symptomatic bradycardia    a. s/p STJ CRTP implanted by Dr Rayann Heman  . Vertigo    Past Surgical History:  Procedure Laterality Date  . ABDOMINAL SURGERY    . APPENDECTOMY    . BI-VENTRICULAR PACEMAKER INSERTION N/A 03/19/2014   STJ CRTP implanted by Dr Rayann Heman  . EYE SURGERY     Cataract removed from Right eye  . HERNIA REPAIR    . LEFT HEART CATHETERIZATION WITH CORONARY ANGIOGRAM N/A 03/19/2014   Procedure: LEFT HEART CATHETERIZATION WITH CORONARY ANGIOGRAM;  Surgeon: [name], MD;  Location: [facility] CATH LAB;  Service: Cardiovascular;  Laterality: N/A;    ROS- all systems are reviewed and negative except as per HPI above  Current Outpatient Medications  Medication Sig Dispense Refill  . albuterol (PROVENTIL HFA;VENTOLIN HFA) 108 (90 Base) MCG/ACT inhaler Inhale 2 puffs into the lungs every 6 (six) hours as needed for wheezing or shortness of breath. 1 Inhaler 2  .  albuterol (VENTOLIN HFA) 108 (90 Base) MCG/ACT inhaler INHALE 2 PUFFS INTO THE LUNGS EVERY 6 (SIX) HOURS AS NEEDED FOR WHEEZING OR SHORTNESS OF BREATH. 18 Inhaler 0  . amLODipine (NORVASC) 10 MG tablet TAKE 1 TABLET (10 MG TOTAL) BY MOUTH DAILY. 90 tablet 1  . aspirin 81 MG tablet Take 81 mg by mouth daily.     Marland Kitchen azelastine (ASTELIN) 0.1 % nasal spray Place 2 sprays into both nostrils 2 (two) times daily. Use in each nostril as directed 30 mL 3  . carvedilol (COREG) 6.25 MG tablet TAKE 1 TABLET (6.25 MG TOTAL) BY MOUTH 2 (TWO) TIMES DAILY WITH A MEAL. 60 tablet 3  . Cetirizine HCl (ZYRTEC ALLERGY) 10 MG CAPS Take 1 capsule (10 mg total) by mouth daily. 30 capsule 0  . fluticasone (FLONASE) 50 MCG/ACT nasal spray Place 2 sprays into both nostrils daily. 16 g 2  . fluticasone (FLOVENT HFA) 110 MCG/ACT inhaler Inhale 2 puffs into the lungs 2 (two) times daily. 1 Inhaler 3  . furosemide (LASIX) 20 MG tablet TAKE 1 TABLET BY MOUTH DAILY 90 tablet 0  . lisinopril (PRINIVIL,ZESTRIL) 20 MG tablet Take 1 tablet (20 mg total) by mouth daily. 90 tablet 1  . omeprazole (PRILOSEC) 20 MG capsule Take 1 capsule (20 mg total) by mouth daily. 90 capsule 1  . rosuvastatin (CRESTOR) 10 MG tablet TAKE 1 TABLET (10 MG TOTAL) BY MOUTH DAILY.  90 tablet 0  . sildenafil (REVATIO) 20 MG tablet 1-2 tab po 1 hour prior to sex 50 tablet 0  . sildenafil (VIAGRA) 25 MG tablet 1-2 tab po 1 hour prior to sex. 25 tablet 1  . tamsulosin (FLOMAX) 0.4 MG CAPS capsule TAKE 1 CAPSULE (0.4 MG TOTAL) BY MOUTH DAILY. 90 capsule 1   No current facility-administered medications for this visit.     Physical Exam: Vitals:   02/19/17 0935  BP: (!) 148/90  Pulse: 78  Weight: 220 lb (99.8 kg)  Height: 6\' 4"  (1.93 m)    GEN- The patient is well appearing, alert and oriented x 3 today.   Head- normocephalic, atraumatic Eyes-  Sclera clear, conjunctiva pink Ears- hearing intact Oropharynx- clear Lungs- Clear to ausculation  bilaterally, normal work of breathing Chest- pacemaker pocket is well healed Heart- Regular rate and rhythm, no murmurs, rubs or gallops, PMI not laterally displaced GI- soft, NT, ND, + BS Extremities- no clubbing, cyanosis, or edema  Pacemaker interrogation- reviewed in detail today,  See PACEART report  ekg tracing ordered today is personally reviewed and shows sinus rhythm with BiV pacing  Assessment and Plan:  1. 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 Nonischemic CM Echo 10/07/15 is reviewed and reveals normalization of EF with CRT Stable No change required today  3. atach No afib episodes No indication for anticoagulation at this time  4. HTN Stable No change required today  5. Chest tightness He has known nonobstructive CAD Symptoms are worrisome for disease progression Will order lexiscan myoview Follow-up with Dr Marlou Porch is overdue.  He should follow-up with Dr Marlou Porch for discussion of stress test findings. 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  I have advised that we will need results of stress testing before we can sign off on this.  Followed in ICM device clinic with [place] to see Dr Marlou Porch Follow-up yearly with EP NP  [name] MD, [facility] 02/19/2017 9:43 AM

## 2017-02-19 NOTE — Telephone Encounter (Signed)
Patient given detailed instructions per Myocardial Perfusion Study Information Sheet for the test on 02/22/17. Patient notified to arrive 15 minutes early and that it is imperative to arrive on time for appointment to keep from having the test rescheduled.  If you need to cancel or reschedule your appointment, please call the office within 24 hours of your appointment. . Patient verbalized understanding. Phillip Booth    

## 2017-02-19 NOTE — Patient Instructions (Addendum)
Medication Instructions:  Your physician recommends that you continue on your current medications as directed. Please refer to the Current Medication list given to you today.   Labwork: None ordered  Testing/Procedures: Your physician has requested that you have a lexiscan myoview next available. For further information please visit [email or web address]. Please follow instruction sheet, as given.   Follow-Up: You are overdue to see Dr. Marlou Porch. Your physician recommends that you schedule a follow-up appointment with Dr. Marlou Porch next available   Remote monitoring is used to monitor your ICD from home. This monitoring reduces the number of office visits required to check your device to one time per year. It allows [name] to keep an eye on the functioning of your device to ensure it is working properly. You are scheduled for a device check from home on 05/14/17. You may send your transmission at any time that day. If you have a wireless device, the transmission will be sent automatically. After your physician reviews your transmission, you will receive a postcard with your next transmission date.    Your physician wants you to follow-up in: 1 year with Chanetta Marshall, NP. You will receive a reminder letter in the mail two months in advance. If you don't receive a letter, please call our office to schedule the follow-up appointment.   Any Other Special Instructions Will Be Listed Below (If Applicable).     If you need a refill on your cardiac medications before your next appointment, please call your pharmacy.

## 2017-02-22 ENCOUNTER — Ambulatory Visit (HOSPITAL_COMMUNITY): Payer: Medicare Other | Attending: Internal Medicine

## 2017-02-22 DIAGNOSIS — Z8673 Personal history of transient ischemic attack (TIA), and cerebral infarction without residual deficits: Secondary | ICD-10-CM | POA: Insufficient documentation

## 2017-02-22 DIAGNOSIS — R0609 Other forms of dyspnea: Secondary | ICD-10-CM | POA: Insufficient documentation

## 2017-02-22 DIAGNOSIS — I10 Essential (primary) hypertension: Secondary | ICD-10-CM | POA: Insufficient documentation

## 2017-02-22 DIAGNOSIS — R0789 Other chest pain: Secondary | ICD-10-CM | POA: Diagnosis not present

## 2017-02-22 DIAGNOSIS — R9431 Abnormal electrocardiogram [ECG] [EKG]: Secondary | ICD-10-CM | POA: Insufficient documentation

## 2017-02-22 DIAGNOSIS — I4891 Unspecified atrial fibrillation: Secondary | ICD-10-CM | POA: Diagnosis not present

## 2017-02-22 LAB — MYOCARDIAL PERFUSION IMAGING
CHL CUP NUCLEAR SDS: 2
CHL CUP NUCLEAR SRS: 0
CHL CUP RESTING HR STRESS: 60 {beats}/min
CSEPPHR: 73 {beats}/min
LHR: 0.25
LV dias vol: 164 mL (ref 62–150)
LV sys vol: 83 mL
SSS: 2
TID: 0.99

## 2017-02-22 MED ORDER — TECHNETIUM TC 99M TETROFOSMIN IV KIT
32.2000 | PACK | Freq: Once | INTRAVENOUS | Status: AC | PRN
  Administered 2017-02-22: 32.2 via INTRAVENOUS
  Filled 2017-02-22: qty 33

## 2017-02-22 MED ORDER — REGADENOSON 0.4 MG/5ML IV SOLN
0.4000 mg | Freq: Once | INTRAVENOUS | Status: AC
  Administered 2017-02-22: 0.4 mg via INTRAVENOUS

## 2017-02-22 MED ORDER — TECHNETIUM TC 99M TETROFOSMIN IV KIT
10.5000 | PACK | Freq: Once | INTRAVENOUS | Status: AC | PRN
  Administered 2017-02-22: 10.5 via INTRAVENOUS
  Filled 2017-02-22: qty 11

## 2017-02-27 ENCOUNTER — Encounter: Payer: Self-pay | Admitting: *Deleted

## 2017-03-02 MED FILL — CARVEDILOL 6.25 MG TABLET: 6.25 | 30 days supply | Qty: 60 | Fill #3

## 2017-03-02 MED FILL — ROSUVASTATIN CALCIUM 10 MG: 10 | 30 days supply | Qty: 30 | Fill #2

## 2017-03-10 LAB — CUP PACEART REMOTE DEVICE CHECK
Battery Remaining Longevity: 87 mo
Battery Voltage: 2.98 V
Brady Statistic AP VP Percent: 95 %
Brady Statistic AP VS Percent: 1 %
Brady Statistic AS VS Percent: 1 %
Brady Statistic RA Percent Paced: 95 %
Date Time Interrogation Session: 20190204090015
Implantable Lead Implant Date: 20160310
Implantable Lead Location: 753859
Implantable Lead Location: 753860
Implantable Lead Model: 1948
Implantable Pulse Generator Implant Date: 20160310
Lead Channel Impedance Value: 460 Ohm
Lead Channel Impedance Value: 700 Ohm
Lead Channel Pacing Threshold Amplitude: 1 V
Lead Channel Pacing Threshold Amplitude: 1.25 V
Lead Channel Pacing Threshold Pulse Width: 0.5 ms
Lead Channel Sensing Intrinsic Amplitude: 3.8 mV
Lead Channel Setting Pacing Amplitude: 2 V
Lead Channel Setting Pacing Amplitude: 2.5 V
Lead Channel Setting Pacing Pulse Width: 0.5 ms
Lead Channel Setting Pacing Pulse Width: 0.5 ms
MDC IDC LEAD IMPLANT DT: 20160310
MDC IDC LEAD IMPLANT DT: 20160310
MDC IDC LEAD LOCATION: 753858
MDC IDC MSMT BATTERY REMAINING PERCENTAGE: 95.5 %
MDC IDC MSMT LEADCHNL LV PACING THRESHOLD AMPLITUDE: 1.5 V
MDC IDC MSMT LEADCHNL LV PACING THRESHOLD PULSEWIDTH: 0.5 ms
MDC IDC MSMT LEADCHNL RA SENSING INTR AMPL: 2.6 mV
MDC IDC MSMT LEADCHNL RV IMPEDANCE VALUE: 560 Ohm
MDC IDC MSMT LEADCHNL RV PACING THRESHOLD PULSEWIDTH: 0.5 ms
MDC IDC SET LEADCHNL RV PACING AMPLITUDE: 2.5 V
MDC IDC SET LEADCHNL RV SENSING SENSITIVITY: 2 mV
MDC IDC STAT BRADY AS VP PERCENT: 5.1 %
Pulse Gen Model: 3242
Pulse Gen Serial Number: 7724805

## 2017-03-19 ENCOUNTER — Encounter: Payer: Self-pay | Admitting: Cardiology

## 2017-03-19 ENCOUNTER — Ambulatory Visit (INDEPENDENT_AMBULATORY_CARE_PROVIDER_SITE_OTHER): Payer: Medicare Other | Admitting: Cardiology

## 2017-03-19 VITALS — BP 134/90 | HR 60 | Ht 76.0 in | Wt 221.1 lb

## 2017-03-19 DIAGNOSIS — I471 Supraventricular tachycardia: Secondary | ICD-10-CM | POA: Diagnosis not present

## 2017-03-19 DIAGNOSIS — R0600 Dyspnea, unspecified: Secondary | ICD-10-CM | POA: Diagnosis not present

## 2017-03-19 NOTE — Patient Instructions (Signed)

## 2017-03-19 NOTE — Progress Notes (Signed)
Cardiology Office Note    Date:  03/19/2017   ID:  Phillip Booth, DOB [birth year], MRN [id number]  PCP:  [name], PA-C  Cardiologist:   Candee Furbish, MD     History of Present Illness:  Phillip Booth is a 67 y.o. male with pacemaker implantation secondary to second degree heart block in 2016, St. Jude with nonischemic cardiomyopathy previously with most recent echocardiogram demonstrating normal ejection fraction here for follow up.   Prior discuss DOT qualifications.  Previously with diagnoses of paroxysmal atrial fibrillation but less than 1% burden noted on pacemaker in the past, likely atrial tachycardia, not atrial fibrillation.  Feels slight GERD at times. [place].  Mild dyspnea. Had allergies. Sinus drain.   03/19/17 Mild chest pain with exertion. NUC stress 02/22/17:  Nuclear stress EF: 49%.  The study is normal.  This is a low risk study.   Normal pharmacologic nuclear stress test with no evidence for prior infarct or ischemia.  LVEF calculated at 49% but appears better, correlation with an echocardiogram is recommended.   Dyspnea noted - checking echo   Past Medical History:  Diagnosis Date  . Allergy   . CAD (coronary artery disease)    a. moderate by cath 03/2014  . Cataract   . CVA (cerebral infarction)    a. diagnosis not clear  . Hyperlipidemia   . Hypertension   . Non-ischemic cardiomyopathy ([place])    a. EF 45% by echo 03/2014  . Paroxysmal atrial fibrillation ([place]) 05/01/2014   asymptomatic, documented on PPM interrogation,  chads2vasc score is at least 6.  He is contemplating anticoagulation  . Symptomatic bradycardia    a. s/p STJ CRTP implanted by Dr Rayann Heman  . Vertigo     Past Surgical History:  Procedure Laterality Date  . ABDOMINAL SURGERY    . APPENDECTOMY    . BI-VENTRICULAR PACEMAKER INSERTION N/A 03/19/2014   STJ CRTP implanted by Dr Rayann Heman  . EYE SURGERY     Cataract removed from Right eye  . HERNIA REPAIR    . LEFT HEART  CATHETERIZATION WITH CORONARY ANGIOGRAM N/A 03/19/2014   Procedure: LEFT HEART CATHETERIZATION WITH CORONARY ANGIOGRAM;  Surgeon: [name], MD;  Location: [facility] CATH LAB;  Service: Cardiovascular;  Laterality: N/A;    Current Medications: Outpatient Medications Prior to Visit  Medication Sig Dispense Refill  . albuterol (PROVENTIL HFA;VENTOLIN HFA) 108 (90 Base) MCG/ACT inhaler Inhale 2 puffs into the lungs every 6 (six) hours as needed for wheezing or shortness of breath. 1 Inhaler 2  . albuterol (VENTOLIN HFA) 108 (90 Base) MCG/ACT inhaler INHALE 2 PUFFS INTO THE LUNGS EVERY 6 (SIX) HOURS AS NEEDED FOR WHEEZING OR SHORTNESS OF BREATH. 18 Inhaler 0  . amLODipine (NORVASC) 10 MG tablet TAKE 1 TABLET (10 MG TOTAL) BY MOUTH DAILY. 90 tablet 1  . aspirin 81 MG tablet Take 81 mg by mouth daily.     Marland Kitchen azelastine (ASTELIN) 0.1 % nasal spray Place 2 sprays into both nostrils 2 (two) times daily. Use in each nostril as directed 30 mL 3  . carvedilol (COREG) 6.25 MG tablet TAKE 1 TABLET (6.25 MG TOTAL) BY MOUTH 2 (TWO) TIMES DAILY WITH A MEAL. 60 tablet 3  . Cetirizine HCl (ZYRTEC ALLERGY) 10 MG CAPS Take 1 capsule (10 mg total) by mouth daily. 30 capsule 0  . fluticasone (FLONASE) 50 MCG/ACT nasal spray Place 2 sprays into both nostrils daily. 16 g 2  . fluticasone (FLOVENT HFA) 110 MCG/ACT inhaler Inhale  2 puffs into the lungs 2 (two) times daily. 1 Inhaler 3  . furosemide (LASIX) 20 MG tablet TAKE 1 TABLET BY MOUTH DAILY 90 tablet 0  . lisinopril (PRINIVIL,ZESTRIL) 20 MG tablet Take 1 tablet (20 mg total) by mouth daily. 90 tablet 1  . omeprazole (PRILOSEC) 20 MG capsule Take 1 capsule (20 mg total) by mouth daily. 90 capsule 1  . rosuvastatin (CRESTOR) 10 MG tablet TAKE 1 TABLET (10 MG TOTAL) BY MOUTH DAILY. 90 tablet 0  . tamsulosin (FLOMAX) 0.4 MG CAPS capsule TAKE 1 CAPSULE (0.4 MG TOTAL) BY MOUTH DAILY. 90 capsule 1  . sildenafil (REVATIO) 20 MG tablet 1-2 tab po 1 hour prior to sex (Patient  not taking: Reported on 03/19/2017) 50 tablet 0  . sildenafil (VIAGRA) 25 MG tablet 1-2 tab po 1 hour prior to sex. (Patient not taking: Reported on 03/19/2017) 25 tablet 1   No facility-administered medications prior to visit.      Allergies:   Lactose intolerance (gi)   Social History   Socioeconomic History  . Marital status: Married    Spouse name: None  . Number of children: None  . Years of education: None  . Highest education level: None  Social Needs  . Financial resource strain: None  . Food insecurity - worry: None  . Food insecurity - inability: None  . Transportation needs - medical: None  . Transportation needs - non-medical: None  Occupational History  . None  Tobacco Use  . Smoking status: Never Smoker  . Smokeless tobacco: Never Used  Substance and Sexual Activity  . Alcohol use: Yes    Comment: occasional  . Drug use: No  . Sexual activity: Yes  Other Topics Concern  . None  Social History Narrative  . None     Family History:  The patient's family history includes Cancer in his mother; Diabetes in his mother; Emphysema in his father; Heart Problems in his brother and other; Heart disease in his mother; Hypertension in his father and mother; Other in his mother.   ROS:   Please see the history of present illness.    Review of Systems  All other systems reviewed and are negative.    PHYSICAL EXAM:   VS:  BP 134/90   Pulse 60   Ht 6\' 4"  (1.93 m)   Wt 221 lb 1.9 oz (100.3 kg)   SpO2 97%   BMI 26.92 kg/m    GEN: Well nourished, well developed, in no acute distress  HEENT: normal  Neck: no JVD, carotid bruits, or masses Cardiac: RRR; no murmurs, rubs, or gallops,no edema  Respiratory:  clear to auscultation bilaterally, normal work of breathing GI: soft, nontender, nondistended, + BS MS: no deformity or atrophy  Skin: warm and dry, no rash Neuro:  Alert and Oriented x 3, Strength and sensation are intact Psych: euthymic mood, full  affect   Wt Readings from Last 3 Encounters:  03/19/17 221 lb 1.9 oz (100.3 kg)  02/22/17 220 lb (99.8 kg)  02/19/17 220 lb (99.8 kg)      Studies/Labs Reviewed:   EKG:  Prior AV pacing 60   Recent Labs: 10/24/2016: ALT 15; BUN 13; Creatinine, Ser 0.94; Hemoglobin 14.7; Platelets 191.0; Potassium 3.8; Sodium 137; TSH 1.68   Lipid Panel    Component Value Date/Time   CHOL 175 10/24/2016 1559   TRIG 160.0 (H) 10/24/2016 1559   HDL 55.80 10/24/2016 1559   CHOLHDL 3 10/24/2016 1559   VLDL  32.0 10/24/2016 1559   LDLCALC 87 10/24/2016 1559    Additional studies/ records that were reviewed today include:   ECHO 10/07/15:   - Left ventricle: The cavity size was normal. There was mild   concentric hypertrophy. Systolic function was normal. The   estimated ejection fraction was in the range of 60% to 65%. Wall   motion was normal; there were no regional wall motion   abnormalities. Doppler parameters are consistent with abnormal   left ventricular relaxation (grade 1 diastolic dysfunction). - Aortic valve: Trileaflet; mildly thickened, mildly calcified   leaflets. There was mild regurgitation. - Mitral valve: Mildly thickened leaflets . There was mild   regurgitation. - Left atrium: Anterior-posterior dimension: 38 mm. - Right ventricle: Pacer wire or catheter noted in right ventricle. - Right atrium: Pacer wire or catheter noted in right atrium.    ASSESSMENT:    1. Dyspnea, unspecified type   2. Atrial tachycardia (HCC)      PLAN:  In order of problems listed above:  Sick sinus syndrome/Mobitz second degree heart block, 2  - St. Jude pacemaker. Normal past function. Dr. Rayann Heman notes reviewed.   Prior nonischemic cardiomyopathy  - Resolved ejection fraction. Now 65%. Excellent. No changes with medications.. Will check ECHO again given his NUC EF and dyspnea  Atrial tachycardia  - Prior interrogation does not appear to be atrial fibrillation. If mode switches  are longer, consider anticoagulation in the future. No new complaints.      Medication Adjustments/Labs and Tests Ordered: Current medicines are reviewed at length with the patient today.  Concerns regarding medicines are outlined above.  Medication changes, Labs and Tests ordered today are listed in the Patient Instructions below. Patient Instructions  Medication Instructions:  The current medical regimen is effective;  continue present plan and medications.  Testing/Procedures: Your physician has requested that you have an echocardiogram. Echocardiography is a painless test that uses sound waves to create images of your heart. It provides your doctor with information about the size and shape of your heart and how well your heart's chambers and valves are working. This procedure takes approximately one hour. There are no restrictions for this procedure.  Follow-Up: Follow up in 1 year with Dr. Marlou Porch.  You will receive a letter in the mail 2 months before you are due.  Please call us when you receive this letter to schedule your follow up appointment.  If you need a refill on your cardiac medications before your next appointment, please call your pharmacy.  Thank you for choosing [facility]!!        Signed, Candee Furbish, MD  03/19/2017 9:54 AM    [facility] [address], [place], [place]  [address] Phone: [phone]; Fax: [phone]

## 2017-03-22 ENCOUNTER — Telehealth: Payer: Self-pay | Admitting: Cardiology

## 2017-03-22 ENCOUNTER — Ambulatory Visit (INDEPENDENT_AMBULATORY_CARE_PROVIDER_SITE_OTHER): Payer: Medicare Other

## 2017-03-22 DIAGNOSIS — I5022 Chronic systolic (congestive) heart failure: Secondary | ICD-10-CM | POA: Diagnosis not present

## 2017-03-22 DIAGNOSIS — Z95810 Presence of automatic (implantable) cardiac defibrillator: Secondary | ICD-10-CM | POA: Diagnosis not present

## 2017-03-22 NOTE — Telephone Encounter (Signed)
LMOVM reminding pt to send remote transmission.   

## 2017-03-23 ENCOUNTER — Other Ambulatory Visit (HOSPITAL_COMMUNITY): Payer: Medicare Other

## 2017-03-23 ENCOUNTER — Telehealth (HOSPITAL_COMMUNITY): Payer: Self-pay | Admitting: Cardiology

## 2017-03-23 NOTE — Telephone Encounter (Signed)
User: Cherie Dark A Date/time: 03/23/17 9:26 AM  Comment: Called pt and lmsg for him to CB to r/s echo due to tech being out sick.Vassie Moment  Context:  Outcome: Left Message  Phone number: [phone] Phone Type: Home Phone  Comm. type: Telephone Call type: Outgoing  Contact: [name] Relation to patient: Self

## 2017-03-28 ENCOUNTER — Ambulatory Visit (HOSPITAL_COMMUNITY): Payer: Medicare Other | Attending: Cardiology

## 2017-03-28 DIAGNOSIS — R0989 Other specified symptoms and signs involving the circulatory and respiratory systems: Secondary | ICD-10-CM

## 2017-03-29 NOTE — Progress Notes (Signed)
EPIC Encounter for ICM Monitoring  Patient Name: Taariq Leitz is a 67 y.o. male Date: 03/29/2017 Primary Care Physican: Elise Benne Primary Cardiologist:Allred  Electrophysiologist: Allred Dry Weight:unknown Bi-V Pacing: >99%      Heart Failure questions reviewed, pt asymptomatic.   Thoracic impedance normal.  Prescribed dosage: Furosemide 20 mg 1 tablet daily  Labs: 10/24/2016 Creatinine 0.94, BUN 13, Potassium 3.8, Sodium 137, EGFR 102.56 03/06/2016 Creatinine 0.93, BUN 12, Potassium 4.7, Sodium 137, EGFR 104.03  Recommendations: No changes.   Encouraged to call for fluid symptoms.  Follow-up plan: ICM clinic phone appointment on 04/26/2017.    Copy of ICM check sent to Dr. Rayann Heman.   3 month ICM trend: 03/25/2017    1 Year ICM trend:       Rosalene Billings, RN 03/29/2017 9:25 AM

## 2017-03-30 ENCOUNTER — Other Ambulatory Visit: Payer: Self-pay | Admitting: Medical

## 2017-04-10 ENCOUNTER — Other Ambulatory Visit: Payer: Self-pay | Admitting: Medical

## 2017-04-10 ENCOUNTER — Ambulatory Visit: Payer: Medicare Other | Admitting: Family Medicine

## 2017-04-10 MED FILL — TAMSULOSIN HCL 0.4 MG CAP: 0.4 | 90 days supply | Qty: 90 | Fill #0

## 2017-04-10 MED FILL — CARVEDILOL 6.25 MG TAB: 6.25 | 30 days supply | Qty: 60 | Fill #0

## 2017-04-23 ENCOUNTER — Other Ambulatory Visit (HOSPITAL_COMMUNITY): Payer: Medicare Other

## 2017-04-23 DIAGNOSIS — R0989 Other specified symptoms and signs involving the circulatory and respiratory systems: Secondary | ICD-10-CM

## 2017-04-26 ENCOUNTER — Telehealth: Payer: Self-pay | Admitting: Cardiology

## 2017-04-26 ENCOUNTER — Ambulatory Visit (INDEPENDENT_AMBULATORY_CARE_PROVIDER_SITE_OTHER): Payer: Medicare Other

## 2017-04-26 DIAGNOSIS — Z95810 Presence of automatic (implantable) cardiac defibrillator: Secondary | ICD-10-CM

## 2017-04-26 DIAGNOSIS — I5022 Chronic systolic (congestive) heart failure: Secondary | ICD-10-CM | POA: Diagnosis not present

## 2017-04-26 NOTE — Telephone Encounter (Signed)
Spoke with pt and reminded pt of remote transmission that is due today. Pt verbalized understanding.   

## 2017-04-27 NOTE — Progress Notes (Signed)
No ICM remote transmission received for 04/26/2017 and next ICM transmission scheduled for 05/14/2017.    

## 2017-04-30 NOTE — Progress Notes (Signed)
EPIC Encounter for ICM Monitoring  Patient Name: Phillip Booth is a 67 y.o. male Date: 04/30/2017 Primary Care Physican: [name], PA-C Primary Cardiologist:Allred  Electrophysiologist: Allred Dry Weight:220 lbs Bi-V Pacing: >99%       Heart Failure questions reviewed, pt asymptomatic.   Thoracic impedance normal.  Prescribed dosage: Furosemide 20 mg 1 tablet daily  Labs: 10/24/2016 Creatinine 0.94, BUN 13, Potassium 3.8, Sodium 137, EGFR 102.56 03/06/2016 Creatinine 0.93, BUN 12, Potassium 4.7, Sodium 137, EGFR 104.03  Recommendations: No changes.    Follow-up plan: ICM clinic phone appointment on 05/28/2017.    Copy of ICM check sent to Dr. Rayann Heman.   3 month ICM trend: 04/30/2017    1 Year ICM trend:       Rosalene Billings, RN 04/30/2017 3:27 PM

## 2017-05-01 ENCOUNTER — Other Ambulatory Visit: Payer: Self-pay

## 2017-05-01 ENCOUNTER — Ambulatory Visit (HOSPITAL_COMMUNITY): Payer: Medicare Other | Attending: Cardiovascular Disease

## 2017-05-01 DIAGNOSIS — R0600 Dyspnea, unspecified: Secondary | ICD-10-CM | POA: Diagnosis not present

## 2017-05-01 DIAGNOSIS — I4891 Unspecified atrial fibrillation: Secondary | ICD-10-CM | POA: Diagnosis not present

## 2017-05-01 DIAGNOSIS — I10 Essential (primary) hypertension: Secondary | ICD-10-CM | POA: Diagnosis not present

## 2017-05-01 DIAGNOSIS — Z8673 Personal history of transient ischemic attack (TIA), and cerebral infarction without residual deficits: Secondary | ICD-10-CM | POA: Insufficient documentation

## 2017-05-01 DIAGNOSIS — E785 Hyperlipidemia, unspecified: Secondary | ICD-10-CM | POA: Diagnosis not present

## 2017-05-01 DIAGNOSIS — I2510 Atherosclerotic heart disease of native coronary artery without angina pectoris: Secondary | ICD-10-CM | POA: Insufficient documentation

## 2017-05-14 ENCOUNTER — Encounter: Payer: Self-pay | Admitting: *Deleted

## 2017-05-14 ENCOUNTER — Encounter: Payer: Medicare Other | Admitting: *Deleted

## 2017-05-14 ENCOUNTER — Ambulatory Visit (INDEPENDENT_AMBULATORY_CARE_PROVIDER_SITE_OTHER): Payer: Medicare Other | Admitting: *Deleted

## 2017-05-14 ENCOUNTER — Ambulatory Visit: Payer: Medicare Other

## 2017-05-14 VITALS — BP 142/84 | HR 61 | Ht 76.0 in | Wt 225.2 lb

## 2017-05-14 DIAGNOSIS — Z0000 Encounter for general adult medical examination without abnormal findings: Secondary | ICD-10-CM

## 2017-05-14 NOTE — Patient Instructions (Signed)
Eat heart healthy diet (full of fruits, vegetables, whole grains, lean protein, water--limit salt, fat, and sugar intake) and increase physical activity as tolerated.  Continue doing brain stimulating activities (puzzles, reading, adult coloring books, staying active) to keep memory sharp.   Bring a copy of your living will and/or healthcare power of attorney to your next office visit.   [name] , Thank you for taking time to come for your Medicare Wellness Visit. I appreciate your ongoing commitment to your health goals. Please review the following plan we discussed and let me know if I can assist you in the future.   These are the goals we discussed: Goals    . Eat a healthy diet with fruit and vegetables.     . Increase physical activity       This is a list of the screening recommended for you and due dates:  Health Maintenance  Topic Date Due  .  Hepatitis C: One time screening is recommended by Center for Disease Control  (CDC) for  adults born from [birth year] through 1967.   07/01/1950  . Colon Cancer Screening  07/26/1998  . Pneumonia vaccines (2 of 2 - PCV13) 03/20/2015  . Tetanus Vaccine  01/09/2017  . Flu Shot  08/09/2017    DASH Eating Plan DASH stands for "Dietary Approaches to Stop Hypertension." The DASH eating plan is a healthy eating plan that has been shown to reduce high blood pressure (hypertension). It may also reduce your risk for type 2 diabetes, heart disease, and stroke. The DASH eating plan may also help with weight loss. What are tips for following this plan? General guidelines  Avoid eating more than 2,300 mg (milligrams) of salt (sodium) a day. If you have hypertension, you may need to reduce your sodium intake to 1,500 mg a day.  Limit alcohol intake to no more than 1 drink a day for nonpregnant women and 2 drinks a day for men. One drink equals 12 oz of beer, 5 oz of wine, or 1 oz of hard liquor.  Work with your health care provider to maintain a  healthy body weight or to lose weight. Ask what an ideal weight is for you.  Get at least 30 minutes of exercise that causes your heart to beat faster (aerobic exercise) most days of the week. Activities may include walking, swimming, or biking.  Work with your health care provider or diet and nutrition specialist (dietitian) to adjust your eating plan to your individual calorie needs. Reading food labels  Check food labels for the amount of sodium per serving. Choose foods with less than 5 percent of the Daily Value of sodium. Generally, foods with less than 300 mg of sodium per serving fit into this eating plan.  To find whole grains, look for the word "whole" as the first word in the ingredient list. Shopping  Buy products labeled as "low-sodium" or "no salt added."  Buy fresh foods. Avoid canned foods and premade or frozen meals. Cooking  Avoid adding salt when cooking. Use salt-free seasonings or herbs instead of table salt or sea salt. Check with your health care provider or pharmacist before using salt substitutes.  Do not fry foods. Cook foods using healthy methods such as baking, boiling, grilling, and broiling instead.  Cook with heart-healthy oils, such as olive, canola, soybean, or sunflower oil. Meal planning   Eat a balanced diet that includes: ? 5 or more servings of fruits and vegetables each day. At each meal,  try to fill half of your plate with fruits and vegetables. ? Up to 6-8 servings of whole grains each day. ? Less than 6 oz of lean meat, poultry, or fish each day. A 3-oz serving of meat is about the same size as a deck of cards. One egg equals 1 oz. ? 2 servings of low-fat dairy each day. ? A serving of nuts, seeds, or beans 5 times each week. ? Heart-healthy fats. Healthy fats called Omega-3 fatty acids are found in foods such as flaxseeds and coldwater fish, like sardines, salmon, and mackerel.  Limit how much you eat of the following: ? Canned or  prepackaged foods. ? Food that is high in trans fat, such as fried foods. ? Food that is high in saturated fat, such as fatty meat. ? Sweets, desserts, sugary drinks, and other foods with added sugar. ? Full-fat dairy products.  Do not salt foods before eating.  Try to eat at least 2 vegetarian meals each week.  Eat more home-cooked food and less restaurant, buffet, and fast food.  When eating at a restaurant, ask that your food be prepared with less salt or no salt, if possible. What foods are recommended? The items listed may not be a complete list. Talk with your dietitian about what dietary choices are best for you. Grains Whole-grain or whole-wheat bread. Whole-grain or whole-wheat pasta. Brown rice. [place]row. Bulgur. Whole-grain and low-sodium cereals. Pita bread. Low-fat, low-sodium crackers. Whole-wheat flour tortillas. Vegetables Fresh or frozen vegetables (raw, steamed, roasted, or grilled). Low-sodium or reduced-sodium tomato and vegetable juice. Low-sodium or reduced-sodium tomato sauce and tomato paste. Low-sodium or reduced-sodium canned vegetables. Fruits All fresh, dried, or frozen fruit. Canned fruit in natural juice (without added sugar). Meat and other protein foods Skinless chicken or [place]. Ground chicken or [place]. Pork with fat trimmed off. Fish and seafood. Egg whites. Dried beans, peas, or lentils. Unsalted nuts, nut butters, and seeds. Unsalted canned beans. Lean cuts of beef with fat trimmed off. Low-sodium, lean deli meat. Dairy Low-fat (1%) or fat-free (skim) milk. Fat-free, low-fat, or reduced-fat cheeses. Nonfat, low-sodium ricotta or cottage cheese. Low-fat or nonfat yogurt. Low-fat, low-sodium cheese. Fats and oils Soft margarine without trans fats. Vegetable oil. Low-fat, reduced-fat, or light mayonnaise and salad dressings (reduced-sodium). Canola, safflower, olive, soybean, and sunflower oils. Avocado. Seasoning and other foods Herbs. Spices.  Seasoning mixes without salt. Unsalted popcorn and pretzels. Fat-free sweets. What foods are not recommended? The items listed may not be a complete list. Talk with your dietitian about what dietary choices are best for you. Grains Baked goods made with fat, such as croissants, muffins, or some breads. Dry pasta or rice meal packs. Vegetables Creamed or fried vegetables. Vegetables in a cheese sauce. Regular canned vegetables (not low-sodium or reduced-sodium). Regular canned tomato sauce and paste (not low-sodium or reduced-sodium). Regular tomato and vegetable juice (not low-sodium or reduced-sodium). Angie Fava. Olives. Fruits Canned fruit in a light or heavy syrup. Fried fruit. Fruit in cream or butter sauce. Meat and other protein foods Fatty cuts of meat. Ribs. Fried meat. Berniece Salines. Sausage. Bologna and other processed lunch meats. Salami. Fatback. Hotdogs. Bratwurst. Salted nuts and seeds. Canned beans with added salt. Canned or smoked fish. Whole eggs or egg yolks. Chicken or [place] with skin. Dairy Whole or 2% milk, cream, and half-and-half. Whole or full-fat cream cheese. Whole-fat or sweetened yogurt. Full-fat cheese. Nondairy creamers. Whipped toppings. Processed cheese and cheese spreads. Fats and oils Butter. Stick margarine. Lard. Shortening. Ghee.  Bacon fat. Tropical oils, such as coconut, palm kernel, or palm oil. Seasoning and other foods Salted popcorn and pretzels. Onion salt, garlic salt, seasoned salt, table salt, and sea salt. Worcestershire sauce. Tartar sauce. Barbecue sauce. Teriyaki sauce. Soy sauce, including reduced-sodium. Steak sauce. Canned and packaged gravies. Fish sauce. Oyster sauce. Cocktail sauce. Horseradish that you find on the shelf. Ketchup. Mustard. Meat flavorings and tenderizers. Bouillon cubes. Hot sauce and Tabasco sauce. Premade or packaged marinades. Premade or packaged taco seasonings. Relishes. Regular salad dressings. Where to find more  information:  National Heart, Lung, and [place]: https://wilson-eaton.com/  American Heart Association: www.heart.org Summary  The DASH eating plan is a healthy eating plan that has been shown to reduce high blood pressure (hypertension). It may also reduce your risk for type 2 diabetes, heart disease, and stroke.  With the DASH eating plan, you should limit salt (sodium) intake to 2,300 mg a day. If you have hypertension, you may need to reduce your sodium intake to 1,500 mg a day.  When on the DASH eating plan, aim to eat more fresh fruits and vegetables, whole grains, lean proteins, low-fat dairy, and heart-healthy fats.  Work with your health care provider or diet and nutrition specialist (dietitian) to adjust your eating plan to your individual calorie needs. This information is not intended to replace advice given to you by your health care provider. Make sure you discuss any questions you have with your health care provider. Document Released: 12/15/2010 Document Revised: 12/20/2015 Document Reviewed: 12/20/2015 Elsevier Interactive Patient Education  [employer].

## 2017-05-14 NOTE — Progress Notes (Addendum)
Subjective:   Phillip Booth is a 67 y.o. male who presents for Medicare Annual/Subsequent preventive examination.  Review of Systems: No ROS.  Medicare Wellness Visit. Additional risk factors are reflected in the social history.  Cardiac Risk Factors include: advanced age (>39men, >1 women);hypertension;male gender;sedentary lifestyle Sleep patterns: Sleeps well per pt. No issues. Home Safety/Smoke Alarms: Feels safe in home. Smoke alarms in place.  Living environment; residence and Firearm Safety: Lives with wife in 2nd floor apt.  Male:   CCS- will discuss with PCP     PSA-  Lab Results  Component Value Date   PSA 0.78 04/14/2014       Objective:    Vitals: BP (!) 142/84 (BP Location: Left Arm, Patient Position: Sitting, Cuff Size: Normal)   Pulse 61   Ht 6\' 4"  (1.93 m)   Wt 225 lb 3.2 oz (102.2 kg)   SpO2 98%   BMI 27.41 kg/m   Body mass index is 27.41 kg/m.  Advanced Directives 05/14/2017 03/06/2016 01/07/2016 12/25/2014 03/17/2014  Does Patient Have a Medical Advance Directive? No No No No No  Would patient like information on creating a medical advance directive? No - Patient declined No - Patient declined Yes (MAU/Ambulatory/Procedural Areas - Information given) - -    Tobacco Social History   Tobacco Use  Smoking Status Never Smoker  Smokeless Tobacco Never Used     Counseling given: Not Answered   Clinical Intake: Pain : No/denies pain    Past Medical History:  Diagnosis Date  . Allergy   . CAD (coronary artery disease)    a. moderate by cath 03/2014  . Cataract   . CVA (cerebral infarction)    a. diagnosis not clear  . Hyperlipidemia   . Hypertension   . Non-ischemic cardiomyopathy ([place])    a. EF 45% by echo 03/2014  . Paroxysmal atrial fibrillation ([place]) 05/01/2014   asymptomatic, documented on PPM interrogation,  chads2vasc score is at least 6.  He is contemplating anticoagulation  . Symptomatic bradycardia    a. s/p STJ CRTP implanted by Dr  Rayann Heman  . Vertigo    Past Surgical History:  Procedure Laterality Date  . ABDOMINAL SURGERY    . APPENDECTOMY    . BI-VENTRICULAR PACEMAKER INSERTION N/A 03/19/2014   STJ CRTP implanted by Dr Rayann Heman  . EYE SURGERY     Cataract removed from Right eye  . HERNIA REPAIR    . LEFT HEART CATHETERIZATION WITH CORONARY ANGIOGRAM N/A 03/19/2014   Procedure: LEFT HEART CATHETERIZATION WITH CORONARY ANGIOGRAM;  Surgeon: [name], MD;  Location: [facility] CATH LAB;  Service: Cardiovascular;  Laterality: N/A;   Family History  Problem Relation Age of Onset  . Hypertension Mother   . Cancer Mother        brain (possibly started in lung)  . Heart disease Mother   . Diabetes Mother   . Other Mother        cardiomegaly  . Hypertension Father   . Emphysema Father   . Heart Problems Brother        pacemaker  . Heart Problems Other        "using 10% of heart"   Social History   Socioeconomic History  . Marital status: Married    Spouse name: Not on file  . Number of children: Not on file  . Years of education: Not on file  . Highest education level: Not on file  Occupational History  . Not on file  Social Needs  . Financial resource strain: Not on file  . Food insecurity:    Worry: Not on file    Inability: Not on file  . Transportation needs:    Medical: Not on file    Non-medical: Not on file  Tobacco Use  . Smoking status: Never Smoker  . Smokeless tobacco: Never Used  Substance and Sexual Activity  . Alcohol use: Yes    Comment: occasional  . Drug use: No  . Sexual activity: Yes  Lifestyle  . Physical activity:    Days per week: Not on file    Minutes per session: Not on file  . Stress: Not on file  Relationships  . Social connections:    Talks on phone: Not on file    Gets together: Not on file    Attends religious service: Not on file    Active member of club or organization: Not on file    Attends meetings of clubs or organizations: Not on file    Relationship  status: Not on file  Other Topics Concern  . Not on file  Social History Narrative  . Not on file    Outpatient Encounter Medications as of 05/14/2017  Medication Sig  . albuterol (PROVENTIL HFA;VENTOLIN HFA) 108 (90 Base) MCG/ACT inhaler Inhale 2 puffs into the lungs every 6 (six) hours as needed for wheezing or shortness of breath.  Marland Kitchen albuterol (VENTOLIN HFA) 108 (90 Base) MCG/ACT inhaler INHALE 2 PUFFS INTO THE LUNGS EVERY 6 (SIX) HOURS AS NEEDED FOR WHEEZING OR SHORTNESS OF BREATH.  Marland Kitchen amLODipine (NORVASC) 10 MG tablet TAKE 1 TABLET (10 MG TOTAL) BY MOUTH DAILY.  Marland Kitchen aspirin 81 MG tablet Take 81 mg by mouth daily.   Marland Kitchen azelastine (ASTELIN) 0.1 % nasal spray Place 2 sprays into both nostrils 2 (two) times daily. Use in each nostril as directed  . carvedilol (COREG) 6.25 MG tablet TAKE 1 TABLET (6.25 MG TOTAL) BY MOUTH 2 (TWO) TIMES DAILY WITH A MEAL.  Marland Kitchen Cetirizine HCl (ZYRTEC ALLERGY) 10 MG CAPS Take 1 capsule (10 mg total) by mouth daily.  . fluticasone (FLONASE) 50 MCG/ACT nasal spray Place 2 sprays into both nostrils daily.  . fluticasone (FLOVENT HFA) 110 MCG/ACT inhaler Inhale 2 puffs into the lungs 2 (two) times daily.  . furosemide (LASIX) 20 MG tablet TAKE 1 TABLET BY MOUTH DAILY  . lisinopril (PRINIVIL,ZESTRIL) 20 MG tablet Take 1 tablet (20 mg total) by mouth daily.  Marland Kitchen omeprazole (PRILOSEC) 20 MG capsule Take 1 capsule (20 mg total) by mouth daily.  . rosuvastatin (CRESTOR) 10 MG tablet TAKE 1 TABLET BY MOUTH DAILY  . tamsulosin (FLOMAX) 0.4 MG CAPS capsule TAKE ONE CAPSULE BY MOUTH DAILY   No facility-administered encounter medications on file as of 05/14/2017.     Activities of Daily Living In your present state of health, do you have any difficulty performing the following activities: 05/14/2017  Hearing? N  Vision? N  Difficulty concentrating or making decisions? N  Walking or climbing stairs? N  Dressing or bathing? N  Doing errands, shopping? N  Preparing Food and eating ?  N  Using the Toilet? N  In the past six months, have you accidently leaked urine? N  Do you have problems with loss of bowel control? N  Managing your Medications? N  Managing your Finances? N  Housekeeping or managing your Housekeeping? N  Some recent data might be hidden    Patient Care Team: [name], Hershal Coria as  PCP - General (Physician Assistant) Patsey Berthold, NP as Nurse Practitioner (Cardiology)   Assessment:   This is a routine wellness examination for [facility]. Physical assessment deferred to PCP.   Exercise Activities and Dietary recommendations Current Exercise Habits: The patient does not participate in regular exercise at present, Exercise limited by: None identified Diet (meal preparation, eat out, water intake, caffeinated beverages, dairy products, fruits and vegetables): Eats out a lot. States he will try to make healthier choices. Proper nutrition discussed.        Goals    . Eat a healthy diet with fruit and vegetables.     . Increase physical activity       Fall Risk Fall Risk  05/14/2017 01/07/2016 12/08/2015 11/09/2014  Falls in the past year? No No No No     Depression Screen PHQ 2/9 Scores 05/14/2017 01/07/2016 12/08/2015 11/09/2014  PHQ - 2 Score 0 0 0 0    Cognitive Function Ad8 score reviewed for issues:  Issues making decisions:no  Less interest in hobbies / activities:no  Repeats questions, stories (family complaining):no  Trouble using ordinary gadgets (microwave, computer, phone):no  Forgets the month or year: no  Mismanaging finances: no  Remembering appts:no  Daily problems with thinking and/or memory:no Ad8 score is=0   MMSE - Mini Mental State Exam 01/07/2016  Orientation to time 5  Orientation to Place 5  Registration 3  Attention/ Calculation 5  Recall 3  Language- name 2 objects 2  Language- repeat 1  Language- follow 3 step command 3  Language- read & follow direction 1  Write a sentence 1  Copy design 1    Total score 30        Immunization History  Administered Date(s) Administered  . Influenza,inj,Quad PF,6+ Mos 11/09/2014, 12/13/2016  . Influenza-Unspecified 10/13/2013  . Pneumococcal Polysaccharide-23 03/20/2014    Screening Tests Health Maintenance  Topic Date Due  . Hepatitis C Screening  1950/03/21  . COLONOSCOPY  07/26/1998  . PNA vac Low Risk Adult (2 of 2 - PCV13) 03/20/2015  . TETANUS/TDAP  01/09/2017  . INFLUENZA VACCINE  08/09/2017      Plan:   Follow up with PCP as directed  Eat heart healthy diet (full of fruits, vegetables, whole grains, lean protein, water--limit salt, fat, and sugar intake) and increase physical activity as tolerated.  Continue doing brain stimulating activities (puzzles, reading, adult coloring books, staying active) to keep memory sharp.   Bring a copy of your living will and/or healthcare power of attorney to your next office visit.    I have personally reviewed and noted the following in the patient's chart:   . Medical and social history . Use of alcohol, tobacco or illicit drugs  . Current medications and supplements . Functional ability and status . Nutritional status . Physical activity . Advanced directives . List of other physicians . Hospitalizations, surgeries, and ER visits in previous 12 months . Vitals . Screenings to include cognitive, depression, and falls . Referrals and appointments  In addition, I have reviewed and discussed with patient certain preventive protocols, quality metrics, and best practice recommendations. A written personalized care plan for preventive services as well as general preventive health recommendations were provided to patient.     Shela Nevin, [place]  05/14/2017  Reviewed and agreee with assessment/plan of RN. [name], PA-C

## 2017-05-15 ENCOUNTER — Telehealth: Payer: Self-pay | Admitting: Cardiology

## 2017-05-15 NOTE — Telephone Encounter (Signed)
LMOVM reminding pt to send remote transmission.   

## 2017-05-17 ENCOUNTER — Emergency Department (HOSPITAL_BASED_OUTPATIENT_CLINIC_OR_DEPARTMENT_OTHER): Payer: Medicare Other

## 2017-05-17 ENCOUNTER — Other Ambulatory Visit: Payer: Self-pay | Admitting: Medical

## 2017-05-17 ENCOUNTER — Other Ambulatory Visit: Payer: Self-pay

## 2017-05-17 ENCOUNTER — Encounter (HOSPITAL_BASED_OUTPATIENT_CLINIC_OR_DEPARTMENT_OTHER): Payer: Self-pay | Admitting: Adult Health

## 2017-05-17 ENCOUNTER — Other Ambulatory Visit: Payer: Self-pay | Admitting: Nurse Practitioner

## 2017-05-17 ENCOUNTER — Encounter: Payer: Self-pay | Admitting: Cardiology

## 2017-05-17 ENCOUNTER — Emergency Department (HOSPITAL_BASED_OUTPATIENT_CLINIC_OR_DEPARTMENT_OTHER)
Admission: EM | Admit: 2017-05-17 | Discharge: 2017-05-17 | Disposition: A | Discharge status: Home / Self Care | Payer: Medicare Other | Attending: Emergency Medicine | Admitting: Emergency Medicine

## 2017-05-17 DIAGNOSIS — S60512A Abrasion of left hand, initial encounter: Secondary | ICD-10-CM | POA: Diagnosis not present

## 2017-05-17 DIAGNOSIS — Y999 Unspecified external cause status: Secondary | ICD-10-CM | POA: Insufficient documentation

## 2017-05-17 DIAGNOSIS — W108XXA Fall (on) (from) other stairs and steps, initial encounter: Secondary | ICD-10-CM | POA: Diagnosis not present

## 2017-05-17 DIAGNOSIS — I10 Essential (primary) hypertension: Secondary | ICD-10-CM | POA: Insufficient documentation

## 2017-05-17 DIAGNOSIS — I252 Old myocardial infarction: Secondary | ICD-10-CM | POA: Insufficient documentation

## 2017-05-17 DIAGNOSIS — I2510 Atherosclerotic heart disease of native coronary artery without angina pectoris: Secondary | ICD-10-CM | POA: Insufficient documentation

## 2017-05-17 DIAGNOSIS — Y929 Unspecified place or not applicable: Secondary | ICD-10-CM | POA: Insufficient documentation

## 2017-05-17 DIAGNOSIS — M25561 Pain in right knee: Secondary | ICD-10-CM | POA: Diagnosis not present

## 2017-05-17 DIAGNOSIS — T07XXXA Unspecified multiple injuries, initial encounter: Secondary | ICD-10-CM

## 2017-05-17 DIAGNOSIS — S8001XA Contusion of right knee, initial encounter: Secondary | ICD-10-CM | POA: Diagnosis not present

## 2017-05-17 DIAGNOSIS — S8991XA Unspecified injury of right lower leg, initial encounter: Secondary | ICD-10-CM | POA: Diagnosis present

## 2017-05-17 DIAGNOSIS — S80212A Abrasion, left knee, initial encounter: Secondary | ICD-10-CM | POA: Diagnosis not present

## 2017-05-17 DIAGNOSIS — Z7982 Long term (current) use of aspirin: Secondary | ICD-10-CM | POA: Insufficient documentation

## 2017-05-17 DIAGNOSIS — Z79899 Other long term (current) drug therapy: Secondary | ICD-10-CM | POA: Diagnosis not present

## 2017-05-17 DIAGNOSIS — Y9389 Activity, other specified: Secondary | ICD-10-CM | POA: Insufficient documentation

## 2017-05-17 DIAGNOSIS — S80211A Abrasion, right knee, initial encounter: Secondary | ICD-10-CM | POA: Diagnosis not present

## 2017-05-17 MED FILL — CARVEDILOL 6.25 MG TAB: 6.25 | 30 days supply | Qty: 60 | Fill #1

## 2017-05-17 MED FILL — ROSUVASTATIN CALCIUM 10 MG: 10 | 30 days supply | Qty: 30 | Fill #0

## 2017-05-17 NOTE — Discharge Instructions (Signed)
Please read and follow all provided instructions.  Your diagnoses today include:  1. Contusion of right knee, initial encounter   2. Multiple abrasions     Tests performed today include:  An x-ray of the affected area - does NOT show any broken bones  Vital signs. See below for your results today.   Medications prescribed:   None  Take any prescribed medications only as directed.  Home care instructions:   Follow any educational materials contained in this packet  Follow R.I.C.E. Protocol:  R - rest your injury   I  - use ice on injury without applying directly to skin  C - compress injury with bandage or splint  E - elevate the injury as much as possible  Follow-up instructions: Please follow-up with your primary care provider if you continue to have significant pain in 1 week. In this case you may have a more severe injury that requires further care.   Return instructions:   Please return if your toes or feet are numb or tingling, appear gray or blue, or you have severe pain (also elevate the leg and loosen splint or wrap if you were given one)  Please return to the Emergency Department if you experience worsening symptoms.   Please return if you have any other emergent concerns.  Additional Information:  Your vital signs today were: BP 128/84    Pulse (!) 59    Temp 98.2 F (36.8 C) (Oral)    Resp 18    Ht 6\' 4"  (1.93 m)    Wt 102.1 kg (225 lb)    SpO2 100%    BMI 27.39 kg/m  If your blood pressure (BP) was elevated above 135/85 this visit, please have this repeated by your doctor within one month. --------------

## 2017-05-17 NOTE — ED Provider Notes (Signed)
[facility] EMERGENCY DEPARTMENT Provider Note   CSN: [id number] Arrival date & time: 05/17/17  1804     History   Chief Complaint Chief Complaint  Patient presents with  . Fall    HPI Phillip Booth is a 67 y.o. male.  History of patient presents to the emergency department today with complaint of abrasions to the left hand, left knee, and right knee.  Patient was walking down stairs when he misstepped and landed on both knees.  He scraped his left knuckles on bricks.  He sustained abrasions to these areas and has had pain since then.  He has been able to ambulate and bear weight.  Denies head or neck injury.  No treatments prior to arrival.     Past Medical History:  Diagnosis Date  . Allergy   . CAD (coronary artery disease)    a. moderate by cath 03/2014  . Cataract   . CVA (cerebral infarction)    a. diagnosis not clear  . Hyperlipidemia   . Hypertension   . Non-ischemic cardiomyopathy ([place])    a. EF 45% by echo 03/2014  . Paroxysmal atrial fibrillation ([place]) 05/01/2014   asymptomatic, documented on PPM interrogation,  chads2vasc score is at least 6.  He is contemplating anticoagulation  . Symptomatic bradycardia    a. s/p STJ CRTP implanted by Dr Rayann Heman  . Vertigo     Patient Active Problem List   Diagnosis Date Noted  . Atrial fibrillation ([place]) 06/25/2014  . HTN (hypertension) 05/25/2014  . History of urinary tract obstruction 04/14/2014  . Erectile dysfunction 04/14/2014  . Non-ischemic cardiomyopathy ([place]) 04/03/2014  . Coronary artery disease involving native coronary artery 04/03/2014  . Second degree heart block   . Pleural effusion, right 03/18/2014  . Bradycardia 03/18/2014  . NSTEMI (non-ST elevated myocardial infarction) [facility])     Past Surgical History:  Procedure Laterality Date  . ABDOMINAL SURGERY    . APPENDECTOMY    . BI-VENTRICULAR PACEMAKER INSERTION N/A 03/19/2014   STJ CRTP implanted by Dr Rayann Heman  . EYE SURGERY     Cataract  removed from Right eye  . HERNIA REPAIR    . LEFT HEART CATHETERIZATION WITH CORONARY ANGIOGRAM N/A 03/19/2014   Procedure: LEFT HEART CATHETERIZATION WITH CORONARY ANGIOGRAM;  Surgeon: [name], MD;  Location: [facility] CATH LAB;  Service: Cardiovascular;  Laterality: N/A;        Home Medications    Prior to Admission medications   Medication Sig Start Date End Date Taking? Authorizing Provider  albuterol (PROVENTIL HFA;VENTOLIN HFA) 108 (90 Base) MCG/ACT inhaler Inhale 2 puffs into the lungs every 6 (six) hours as needed for wheezing or shortness of breath. 02/07/17   Saguier, Percell Miller, PA-C  albuterol (VENTOLIN HFA) 108 (90 Base) MCG/ACT inhaler INHALE 2 PUFFS INTO THE LUNGS EVERY 6 (SIX) HOURS AS NEEDED FOR WHEEZING OR SHORTNESS OF BREATH. 02/15/16   Saguier, Percell Miller, PA-C  amLODipine (NORVASC) 10 MG tablet TAKE 1 TABLET (10 MG TOTAL) BY MOUTH DAILY. 02/15/16   Saguier, Percell Miller, PA-C  aspirin 81 MG tablet Take 81 mg by mouth daily.     [provider]  azelastine (ASTELIN) 0.1 % nasal spray Place 2 sprays into both nostrils 2 (two) times daily. Use in each nostril as directed 02/12/17   Saguier, Percell Miller, PA-C  carvedilol (COREG) 6.25 MG tablet TAKE 1 TABLET (6.25 MG TOTAL) BY MOUTH 2 (TWO) TIMES DAILY WITH A MEAL. 04/10/17   Saguier, Percell Miller, PA-C  Cetirizine HCl (  ZYRTEC ALLERGY) 10 MG CAPS Take 1 capsule (10 mg total) by mouth daily. 03/06/16   Ward, Delice Bison, DO  fluticasone (FLONASE) 50 MCG/ACT nasal spray Place 2 sprays into both nostrils daily. 02/12/17   Saguier, Percell Miller, PA-C  fluticasone (FLOVENT HFA) 110 MCG/ACT inhaler Inhale 2 puffs into the lungs 2 (two) times daily. 02/08/17   Saguier, Percell Miller, PA-C  furosemide (LASIX) 20 MG tablet TAKE 1 TABLET BY MOUTH DAILY 01/24/17   Chanetta Marshall K, NP  lisinopril (PRINIVIL,ZESTRIL) 20 MG tablet Take 1 tablet (20 mg total) by mouth daily. 12/29/16   Saguier, Percell Miller, PA-C  omeprazole (PRILOSEC) 20 MG capsule Take 1 capsule (20 mg total) by mouth daily.  02/15/16   Saguier, Percell Miller, PA-C  rosuvastatin (CRESTOR) 10 MG tablet TAKE 1 TABLET BY MOUTH DAILY 04/10/17   Saguier, Percell Miller, PA-C  tamsulosin (FLOMAX) 0.4 MG CAPS capsule TAKE ONE CAPSULE BY MOUTH DAILY 04/10/17   Saguier, Percell Miller, PA-C    Family History Family History  Problem Relation Age of Onset  . Hypertension Mother   . Cancer Mother        brain (possibly started in lung)  . Heart disease Mother   . Diabetes Mother   . Other Mother        cardiomegaly  . Hypertension Father   . Emphysema Father   . Heart Problems Brother        pacemaker  . Heart Problems Other        "using 10% of heart"    Social History Social History   Tobacco Use  . Smoking status: Never Smoker  . Smokeless tobacco: Never Used  Substance Use Topics  . Alcohol use: Yes    Comment: occasional  . Drug use: No     Allergies   Lactose intolerance (gi)   Review of Systems Review of Systems  Constitutional: Negative for activity change.  Musculoskeletal: Positive for arthralgias. Negative for back pain, gait problem, joint swelling and neck pain.  Skin: Positive for wound.  Neurological: Negative for weakness and numbness.     Physical Exam Updated Vital Signs BP 128/84   Pulse (!) 59   Temp 98.2 F (36.8 C) (Oral)   Resp 18   Ht 6\' 4"  (1.93 m)   Wt 102.1 kg (225 lb)   SpO2 100%   BMI 27.39 kg/m   Physical Exam  Constitutional: He appears well-developed and well-nourished.  HENT:  Head: Normocephalic and atraumatic.  Eyes: Conjunctivae are normal.  Neck: Normal range of motion. Neck supple.  Cardiovascular: Normal pulses. Exam reveals no decreased pulses.  Musculoskeletal: He exhibits tenderness. He exhibits no edema.       Right shoulder: Normal.       Left shoulder: Normal.       Right elbow: Normal.      Left elbow: Normal.       Right wrist: Normal.       Left wrist: Normal.       Right hip: Normal.       Left hip: Normal.       Right knee: He exhibits normal range of  motion, no swelling and no effusion. Tenderness found.       Left knee: He exhibits normal range of motion, no swelling and no effusion. Tenderness found.       Right ankle: Normal.       Left ankle: Normal.       Cervical back: Normal.  Thoracic back: Normal.       Lumbar back: Normal.       Right hand: Normal.       Left hand: He exhibits normal range of motion, no tenderness and no bony tenderness.       Hands:      Legs: Neurological: He is alert. No sensory deficit.  Motor, sensation, and vascular distal to the injury is fully intact.   Skin: Skin is warm and dry.  Psychiatric: He has a normal mood and affect.  Nursing note and vitals reviewed.    ED Treatments / Results  Labs (all labs ordered are listed, but only abnormal results are displayed) Labs Reviewed - No data to display  EKG None  Radiology Dg Knee Complete 4 Views Right  Result Date: 05/17/2017 CLINICAL DATA:  RIGHT knee pain.  Status post fall. EXAM: RIGHT KNEE - COMPLETE 4+ VIEW COMPARISON:  None. FINDINGS: No evidence of fracture, dislocation, or joint effusion. No evidence of arthropathy or other focal bone abnormality. Soft tissues are unremarkable. IMPRESSION: Negative. Electronically Signed   By: Staci Righter M.D.   On: 05/17/2017 19:15    Procedures Procedures (including critical care time)  Medications Ordered in ED Medications - No data to display   Initial Impression / Assessment and Plan / ED Course  I have reviewed the triage vital signs and the nursing notes.  Pertinent labs & imaging results that were available during my care of the patient were reviewed by me and considered in my medical decision making (see chart for details).     Patient seen and examined.    Vital signs reviewed and are as follows: BP 128/84   Pulse (!) 59   Temp 98.2 F (36.8 C) (Oral)   Resp 18   Ht 6\' 4"  (1.93 m)   Wt 102.1 kg (225 lb)   SpO2 100%   BMI 27.39 kg/m   Imaging negative.  Will place  Ace wrap.  Patient counseled on conservative measures including rice protocol.  Encouraged PCP follow-up 1 week if not improved.  Final Clinical Impressions(s) / ED Diagnoses   Final diagnoses:  Contusion of right knee, initial encounter  Multiple abrasions   Patient presents to the emergency department with knee pain and several abrasions after a fall.  Fall was caused by stumbling down some stairs.  Patient had no chest pain, syncopal symptoms prior to falling.  No dizziness.  Patient did not hit his head or lose consciousness.  Extremities are neurovascularly intact.  Imaging of the right knee which is where the patient hurts the most is negative for fracture.  Conservative measures indicated at this point with good wound care.  ED Discharge Orders    None       Carlisle Cater, Hershal Coria 05/17/17 1931    Julianne Rice, MD 05/18/17 [phone]

## 2017-05-17 NOTE — ED Notes (Signed)
Pt verbalizes understanding of d/c instructions and denies any further needs at this time. 

## 2017-05-17 NOTE — ED Triage Notes (Signed)
PResents post fall down 2-3 steps, he states he lost his balance and fell, denies LOC. C/o right knee pain and left hand pain. Multiple abrasions noted.

## 2017-05-18 MED FILL — FUROSEMIDE 20 MG TAB: 20 | 90 days supply | Qty: 90 | Fill #0

## 2017-05-18 MED FILL — LISINOPRIL 20 MG TABLET: 20 | 90 days supply | Qty: 90 | Fill #0

## 2017-05-21 ENCOUNTER — Ambulatory Visit (INDEPENDENT_AMBULATORY_CARE_PROVIDER_SITE_OTHER): Payer: Medicare Other | Admitting: *Deleted

## 2017-05-21 DIAGNOSIS — R001 Bradycardia, unspecified: Secondary | ICD-10-CM | POA: Diagnosis not present

## 2017-05-21 NOTE — Progress Notes (Signed)
Remote pacemaker transmission.   

## 2017-05-22 ENCOUNTER — Telehealth: Payer: Self-pay | Admitting: Cardiology

## 2017-05-22 NOTE — Telephone Encounter (Signed)
Spoke with patient who said that it is all cleared up with his medication refill.  The pharmacy said that it is ready for pickup.

## 2017-05-22 NOTE — Telephone Encounter (Signed)
Spoke with patient who is trying to refill his Lisinopril medication.    Apparently, Dr Harvie Heck is needing to approve medication refill.  I saw it reordered on 5/10, but the patient will check to see if it is available.

## 2017-05-22 NOTE — Telephone Encounter (Signed)
New Message:       Pt c/o medication issue:  1. Name of Medication: lisinopril (PRINIVIL,ZESTRIL) 20 MG tablet  2. How are you currently taking this medication (dosage and times per day)? TAKE 1 TABLET BY MOUTH DAILY  3. Are you having a reaction (difficulty breathing--STAT)? No  4. What is your medication issue? Pt is having a hard time getting this medication when he goes by to pick it up at the pharmacy. Pt states the pharmacy is still waiting for approval from [name] to fill this medication.

## 2017-05-23 ENCOUNTER — Encounter: Payer: Self-pay | Admitting: Cardiology

## 2017-05-23 LAB — CUP PACEART REMOTE DEVICE CHECK
Battery Remaining Longevity: 82 mo
Battery Voltage: 2.96 V
Brady Statistic AS VS Percent: 1 %
Brady Statistic RA Percent Paced: 95 %
Implantable Lead Implant Date: 20160310
Implantable Lead Implant Date: 20160310
Implantable Lead Implant Date: 20160310
Implantable Lead Location: 753858
Implantable Lead Model: 1948
Implantable Pulse Generator Implant Date: 20160310
Lead Channel Pacing Threshold Amplitude: 0.75 V
Lead Channel Pacing Threshold Pulse Width: 0.5 ms
Lead Channel Pacing Threshold Pulse Width: 0.5 ms
Lead Channel Sensing Intrinsic Amplitude: 3.4 mV
Lead Channel Setting Pacing Amplitude: 2.5 V
Lead Channel Setting Pacing Amplitude: 2.5 V
Lead Channel Setting Sensing Sensitivity: 2 mV
MDC IDC LEAD LOCATION: 753859
MDC IDC LEAD LOCATION: 753860
MDC IDC MSMT BATTERY REMAINING PERCENTAGE: 95.5 %
MDC IDC MSMT LEADCHNL LV IMPEDANCE VALUE: 690 Ohm
MDC IDC MSMT LEADCHNL RA IMPEDANCE VALUE: 460 Ohm
MDC IDC MSMT LEADCHNL RA PACING THRESHOLD AMPLITUDE: 1 V
MDC IDC MSMT LEADCHNL RV IMPEDANCE VALUE: 560 Ohm
MDC IDC MSMT LEADCHNL RV PACING THRESHOLD AMPLITUDE: 0.75 V
MDC IDC MSMT LEADCHNL RV PACING THRESHOLD PULSEWIDTH: 0.5 ms
MDC IDC MSMT LEADCHNL RV SENSING INTR AMPL: 12 mV
MDC IDC PG SERIAL: 7724805
MDC IDC SESS DTM: 20190513013551
MDC IDC SET LEADCHNL LV PACING PULSEWIDTH: 0.5 ms
MDC IDC SET LEADCHNL RA PACING AMPLITUDE: 2 V
MDC IDC SET LEADCHNL RV PACING PULSEWIDTH: 0.5 ms
MDC IDC STAT BRADY AP VP PERCENT: 95 %
MDC IDC STAT BRADY AP VS PERCENT: 1 %
MDC IDC STAT BRADY AS VP PERCENT: 5 %

## 2017-05-28 ENCOUNTER — Ambulatory Visit (INDEPENDENT_AMBULATORY_CARE_PROVIDER_SITE_OTHER): Payer: Medicare Other

## 2017-05-28 DIAGNOSIS — I5022 Chronic systolic (congestive) heart failure: Secondary | ICD-10-CM

## 2017-05-28 DIAGNOSIS — Z95810 Presence of automatic (implantable) cardiac defibrillator: Secondary | ICD-10-CM

## 2017-05-29 NOTE — Progress Notes (Addendum)
EPIC Encounter for ICM Monitoring  Patient Name: Asiel Chrostowski is a 67 y.o. male Date: 05/29/2017 Primary Care Physican: [name], PA-C Primary Cardiologist:[place] Electrophysiologist: Allred Dry Weight:226 lbs Bi-V Pacing: >99%       Heart Failure questions reviewed, pt asymptomatic.   Thoracic impedance normal  Prescribed dosage: Furosemide 20 mg 1 tablet daily  Labs: 10/24/2016 Creatinine 0.94, BUN 13, Potassium 3.8, Sodium 137, EGFR 102.56 03/06/2016 Creatinine 0.93, BUN 12, Potassium 4.7, Sodium 137, EGFR 104.03  Recommendations: No changes.    Encouraged to call for fluid symptoms.  Follow-up plan: ICM clinic phone appointment on 06/28/2017.    Copy of ICM check sent to Dr. Rayann Heman.   3 month ICM trend: 05/28/2017    1 Year ICM trend:       Rosalene Billings, RN 05/29/2017 9:51 AM

## 2017-06-12 ENCOUNTER — Other Ambulatory Visit: Payer: Self-pay

## 2017-06-12 MED ORDER — ROSUVASTATIN CALCIUM 10 MG PO TABS: ORAL_TABLET | ORAL | 0 refills | Status: DC

## 2017-06-28 ENCOUNTER — Ambulatory Visit (INDEPENDENT_AMBULATORY_CARE_PROVIDER_SITE_OTHER): Payer: Medicare Other

## 2017-06-28 DIAGNOSIS — I5022 Chronic systolic (congestive) heart failure: Secondary | ICD-10-CM

## 2017-06-28 DIAGNOSIS — Z95810 Presence of automatic (implantable) cardiac defibrillator: Secondary | ICD-10-CM

## 2017-06-28 MED FILL — CARVEDILOL 6.25 MG TAB: 6.25 | 30 days supply | Qty: 60 | Fill #2

## 2017-06-28 MED FILL — ROSUVASTATIN CALCIUM 10 MG: 10 | 30 days supply | Qty: 30 | Fill #0

## 2017-06-29 ENCOUNTER — Telehealth: Payer: Self-pay

## 2017-06-29 NOTE — Progress Notes (Signed)
EPIC Encounter for ICM Monitoring  Patient Name: Phillip Booth is a 67 y.o. male Date: 06/29/2017 Primary Care Physican: [name], PA-C Primary Cardiologist:Skains Electrophysiologist: Allred Dry Weight:Previous [id number] lbs Bi-V Pacing: >99%        Attempted call to patient and unable to reach.  Left message to return call.  Transmission reviewed.    Thoracic impedance normal.  Prescribed dosage: Furosemide 20 mg 1 tablet daily  Labs: 10/24/2016 Creatinine 0.94, BUN 13, Potassium 3.8, Sodium 137, EGFR 102.56 03/06/2016 Creatinine 0.93, BUN 12, Potassium 4.7, Sodium 137, EGFR 104.03  Recommendations: NONE - Unable to reach.  Follow-up plan: ICM clinic phone appointment on 07/30/2017.   Copy of ICM check sent to Dr. Rayann Heman.   3 month ICM trend: 06/28/2017    1 Year ICM trend:       Rosalene Billings, RN 06/29/2017 7:27 AM

## 2017-06-29 NOTE — Progress Notes (Signed)
Patient returned call and he said he is feeling good.  Current weight is 225 lbs.  He denied any fluid symptoms. Transmission reviewed.  No changes and encouraged to call for fluid symptoms.  Next ICM transmission scheduled 07/30/2017.

## 2017-06-29 NOTE — Telephone Encounter (Signed)
Remote ICM transmission received.  Attempted call to patient and left message to return call. 

## 2017-07-30 ENCOUNTER — Telehealth: Payer: Self-pay

## 2017-07-30 NOTE — Telephone Encounter (Signed)
LMOVM reminding pt to send remote transmission.   

## 2017-07-31 NOTE — Progress Notes (Signed)
No ICM remote transmission received for 07/30/2017 and next ICM transmission scheduled for 08/20/2017.

## 2017-08-08 MED FILL — CARVEDILOL 6.25 MG TABLET: 6.25 | 30 days supply | Qty: 60 | Fill #3

## 2017-08-08 MED FILL — ROSUVASTATIN CALCIUM 10 MG: 10 | 30 days supply | Qty: 30 | Fill #1

## 2017-08-13 ENCOUNTER — Telehealth: Payer: Self-pay

## 2017-08-13 NOTE — Telephone Encounter (Signed)
Returned call to patient as requested by voice mail message regarding transmission.  He was returning the call from 07/30/2017.  Advised a transmission was not received that day and was rescheduled for 08/20/2017.  He said that would be fine.

## 2017-08-20 ENCOUNTER — Ambulatory Visit (INDEPENDENT_AMBULATORY_CARE_PROVIDER_SITE_OTHER): Payer: Medicare Other

## 2017-08-20 ENCOUNTER — Ambulatory Visit (INDEPENDENT_AMBULATORY_CARE_PROVIDER_SITE_OTHER): Payer: Medicare Other | Admitting: *Deleted

## 2017-08-20 DIAGNOSIS — R001 Bradycardia, unspecified: Secondary | ICD-10-CM | POA: Diagnosis not present

## 2017-08-20 DIAGNOSIS — I5022 Chronic systolic (congestive) heart failure: Secondary | ICD-10-CM

## 2017-08-20 DIAGNOSIS — Z95810 Presence of automatic (implantable) cardiac defibrillator: Secondary | ICD-10-CM | POA: Diagnosis not present

## 2017-08-20 NOTE — Progress Notes (Signed)
Remote pacemaker transmission.   

## 2017-08-20 NOTE — Progress Notes (Signed)
EPIC Encounter for ICM Monitoring  Patient Name: Phillip Booth is a 67 y.o. male Date: 08/20/2017 Primary Care Physican: [name], PA-C Primary Cardiologist:Skains Electrophysiologist: Allred Dry Weight:225lbs Bi-V Pacing: >99%      Heart Failure questions reviewed, pt asymptomatic.   Thoracic impedance normal.  Prescribed dosage: Furosemide 20 mg 1 tablet daily  Labs: 10/24/2016 Creatinine 0.94, BUN 13, Potassium 3.8, Sodium 137, EGFR 102.56 03/06/2016 Creatinine 0.93, BUN 12, Potassium 4.7, Sodium 137, EGFR 104.03  Recommendations: No changes.   Encouraged to call for fluid symptoms.  Follow-up plan: ICM clinic phone appointment on 09/20/2017.    Copy of ICM check sent to Dr. Rayann Heman.   3 month ICM trend: 08/20/2017    1 Year ICM trend:       Rosalene Billings, RN 08/20/2017 2:16 PM

## 2017-08-21 ENCOUNTER — Encounter: Payer: Self-pay | Admitting: Cardiology

## 2017-09-03 MED FILL — TAMSULOSIN HCL 0.4 MG CAP: 0.4 | 90 days supply | Qty: 90 | Fill #1

## 2017-09-13 LAB — CUP PACEART REMOTE DEVICE CHECK
Battery Remaining Percentage: 95.5 %
Battery Voltage: 2.96 V
Brady Statistic AP VP Percent: 97 %
Brady Statistic AP VS Percent: 1 %
Brady Statistic AS VP Percent: 3.4 %
Brady Statistic AS VS Percent: 1 %
Date Time Interrogation Session: 20190812060015
Implantable Lead Implant Date: 20160310
Implantable Lead Implant Date: 20160310
Implantable Lead Location: 753858
Lead Channel Impedance Value: 560 Ohm
Lead Channel Pacing Threshold Amplitude: 0.75 V
Lead Channel Pacing Threshold Amplitude: 0.75 V
Lead Channel Pacing Threshold Amplitude: 1 V
Lead Channel Pacing Threshold Pulse Width: 0.5 ms
Lead Channel Pacing Threshold Pulse Width: 0.5 ms
Lead Channel Pacing Threshold Pulse Width: 0.5 ms
Lead Channel Sensing Intrinsic Amplitude: 3.1 mV
Lead Channel Setting Pacing Amplitude: 2.5 V
Lead Channel Setting Pacing Amplitude: 2.5 V
Lead Channel Setting Pacing Pulse Width: 0.5 ms
Lead Channel Setting Sensing Sensitivity: 2 mV
MDC IDC LEAD IMPLANT DT: 20160310
MDC IDC LEAD LOCATION: 753859
MDC IDC LEAD LOCATION: 753860
MDC IDC MSMT BATTERY REMAINING LONGEVITY: 82 mo
MDC IDC MSMT LEADCHNL LV IMPEDANCE VALUE: 750 Ohm
MDC IDC MSMT LEADCHNL RA IMPEDANCE VALUE: 410 Ohm
MDC IDC MSMT LEADCHNL RV SENSING INTR AMPL: 12 mV
MDC IDC PG IMPLANT DT: 20160310
MDC IDC PG SERIAL: 7724805
MDC IDC SET LEADCHNL RA PACING AMPLITUDE: 2 V
MDC IDC SET LEADCHNL RV PACING PULSEWIDTH: 0.5 ms
MDC IDC STAT BRADY RA PERCENT PACED: 97 %
Pulse Gen Model: 3242

## 2017-09-20 ENCOUNTER — Ambulatory Visit (INDEPENDENT_AMBULATORY_CARE_PROVIDER_SITE_OTHER): Payer: Medicare Other

## 2017-09-20 DIAGNOSIS — Z95810 Presence of automatic (implantable) cardiac defibrillator: Secondary | ICD-10-CM

## 2017-09-20 DIAGNOSIS — I5022 Chronic systolic (congestive) heart failure: Secondary | ICD-10-CM

## 2017-09-21 NOTE — Progress Notes (Signed)
EPIC Encounter for ICM Monitoring  Patient Name: Phillip Booth is a 67 y.o. male Date: 09/21/2017 Primary Care Physican: [name], PA-C Primary Cardiologist:Skains Electrophysiologist: Allred Dry Weight:225lbs Bi-V Pacing: >99%                                          Heart Failure questions reviewed, pt asymptomatic.   Thoracic impedance normal.  Prescribed dosage: Furosemide 20 mg 1 tablet daily  Labs: 10/24/2016 Creatinine 0.94, BUN 13, Potassium 3.8, Sodium 137, EGFR 102.56 03/06/2016 Creatinine 0.93, BUN 12, Potassium 4.7, Sodium 137, EGFR 104.03  Recommendations: No changes.   Encouraged to call for fluid symptoms.  Follow-up plan: ICM clinic phone appointment on 10/22/2017.    Copy of ICM check sent to Dr. Rayann Heman.   3 month ICM trend: 09/20/2017    1 Year ICM trend:       Rosalene Billings, RN 09/21/2017 11:30 AM

## 2017-09-24 MED FILL — ROSUVASTATIN CALCIUM 10 MG: 10 | 30 days supply | Qty: 30 | Fill #2

## 2017-09-24 MED FILL — LISINOPRIL 20 MG TABLET: 20 | 90 days supply | Qty: 90 | Fill #1

## 2017-09-24 MED FILL — FUROSEMIDE 20 MG TAB: 20 | 90 days supply | Qty: 90 | Fill #1

## 2017-09-26 ENCOUNTER — Other Ambulatory Visit: Payer: Self-pay

## 2017-09-26 ENCOUNTER — Other Ambulatory Visit: Payer: Self-pay | Admitting: Medical

## 2017-09-26 ENCOUNTER — Encounter (HOSPITAL_BASED_OUTPATIENT_CLINIC_OR_DEPARTMENT_OTHER): Payer: Self-pay | Admitting: *Deleted

## 2017-09-26 ENCOUNTER — Emergency Department (HOSPITAL_BASED_OUTPATIENT_CLINIC_OR_DEPARTMENT_OTHER)
Admission: EM | Admit: 2017-09-26 | Discharge: 2017-09-26 | Disposition: A | Discharge status: Home / Self Care | Payer: Medicare Other | Attending: Emergency Medicine | Admitting: Emergency Medicine

## 2017-09-26 DIAGNOSIS — Z79899 Other long term (current) drug therapy: Secondary | ICD-10-CM | POA: Insufficient documentation

## 2017-09-26 DIAGNOSIS — I2510 Atherosclerotic heart disease of native coronary artery without angina pectoris: Secondary | ICD-10-CM | POA: Diagnosis not present

## 2017-09-26 DIAGNOSIS — R42 Dizziness and giddiness: Secondary | ICD-10-CM | POA: Diagnosis present

## 2017-09-26 DIAGNOSIS — I10 Essential (primary) hypertension: Secondary | ICD-10-CM | POA: Diagnosis not present

## 2017-09-26 DIAGNOSIS — Z7982 Long term (current) use of aspirin: Secondary | ICD-10-CM | POA: Diagnosis not present

## 2017-09-26 NOTE — ED Provider Notes (Addendum)
[facility] EMERGENCY DEPARTMENT Provider Note   CSN: [id number] Arrival date & time: 09/26/17  1613     History   Chief Complaint Chief Complaint  Patient presents with  . Hypertension    HPI Aneesh Faller is a 67 y.o. male.  HPI   Patient is a 67 year old male with a history of CAD, CVA, hyperlipidemia, hypertension, nonischemic cardiomyopathy, paroxysmal atrial fibrillation, symptomatic bradycardia, vertigo presents emergency department today for evaluation of high blood pressure.  Patient states that he was rushing around this morning prior to take his blood pressure medications.  Later on in the day he had an episode where he felt "woozy".  Further describes this as nauseated and "slightly lightheaded".  States he drove to [facility] and took his blood pressure which was 182/102.  Following this he drove to the emergency department for evaluation.  He states that his symptoms have completely resolved at this time.  Denies headache, lightheadedness, vision changes, numbness or weakness to the arms or legs.  Denies chest pain or shortness of breath.  Is no longer nauseated.  Denies any vomiting abdominal pain diarrhea or urinary symptoms.  Past Medical History:  Diagnosis Date  . Allergy   . CAD (coronary artery disease)    a. moderate by cath 03/2014  . Cataract   . CVA (cerebral infarction)    a. diagnosis not clear  . Hyperlipidemia   . Hypertension   . Non-ischemic cardiomyopathy ([place])    a. EF 45% by echo 03/2014  . Paroxysmal atrial fibrillation ([place]) 05/01/2014   asymptomatic, documented on PPM interrogation,  chads2vasc score is at least 6.  He is contemplating anticoagulation  . Symptomatic bradycardia    a. s/p STJ CRTP implanted by Dr Rayann Heman  . Vertigo     Patient Active Problem List   Diagnosis Date Noted  . Atrial fibrillation ([place]) 06/25/2014  . HTN (hypertension) 05/25/2014  . History of urinary tract obstruction 04/14/2014  . Erectile dysfunction  04/14/2014  . Non-ischemic cardiomyopathy ([place]) 04/03/2014  . Coronary artery disease involving native coronary artery 04/03/2014  . Second degree heart block   . Pleural effusion, right 03/18/2014  . Bradycardia 03/18/2014  . NSTEMI (non-ST elevated myocardial infarction) [facility])     Past Surgical History:  Procedure Laterality Date  . ABDOMINAL SURGERY    . APPENDECTOMY    . BI-VENTRICULAR PACEMAKER INSERTION N/A 03/19/2014   STJ CRTP implanted by Dr Rayann Heman  . EYE SURGERY     Cataract removed from Right eye  . HERNIA REPAIR    . LEFT HEART CATHETERIZATION WITH CORONARY ANGIOGRAM N/A 03/19/2014   Procedure: LEFT HEART CATHETERIZATION WITH CORONARY ANGIOGRAM;  Surgeon: [name], MD;  Location: [facility] CATH LAB;  Service: Cardiovascular;  Laterality: N/A;        Home Medications    Prior to Admission medications   Medication Sig Start Date End Date Taking? Authorizing Provider  albuterol (PROVENTIL HFA;VENTOLIN HFA) 108 (90 Base) MCG/ACT inhaler Inhale 2 puffs into the lungs every 6 (six) hours as needed for wheezing or shortness of breath. 02/07/17   Saguier, Percell Miller, PA-C  albuterol (VENTOLIN HFA) 108 (90 Base) MCG/ACT inhaler INHALE 2 PUFFS INTO THE LUNGS EVERY 6 (SIX) HOURS AS NEEDED FOR WHEEZING OR SHORTNESS OF BREATH. 02/15/16   Saguier, Percell Miller, PA-C  amLODipine (NORVASC) 10 MG tablet TAKE 1 TABLET (10 MG TOTAL) BY MOUTH DAILY. 02/15/16   Saguier, Percell Miller, PA-C  aspirin 81 MG tablet Take 81 mg by mouth daily.  [provider]  azelastine (ASTELIN) 0.1 % nasal spray Place 2 sprays into both nostrils 2 (two) times daily. Use in each nostril as directed 02/12/17   Saguier, Percell Miller, PA-C  carvedilol (COREG) 6.25 MG tablet TAKE 1 TABLET (6.25 MG TOTAL) BY MOUTH 2 (TWO) TIMES DAILY WITH A MEAL. 04/10/17   Saguier, Percell Miller, PA-C  Cetirizine HCl (ZYRTEC ALLERGY) 10 MG CAPS Take 1 capsule (10 mg total) by mouth daily. 03/06/16   Ward, Delice Bison, DO  fluticasone (FLONASE) 50 MCG/ACT nasal  spray Place 2 sprays into both nostrils daily. 02/12/17   Saguier, Percell Miller, PA-C  fluticasone (FLOVENT HFA) 110 MCG/ACT inhaler Inhale 2 puffs into the lungs 2 (two) times daily. 02/08/17   Saguier, Percell Miller, PA-C  furosemide (LASIX) 20 MG tablet TAKE 1 TABLET BY MOUTH DAILY 05/18/17   Chanetta Marshall K, NP  lisinopril (PRINIVIL,ZESTRIL) 20 MG tablet TAKE 1 TABLET BY MOUTH DAILY 05/18/17   Saguier, Percell Miller, PA-C  omeprazole (PRILOSEC) 20 MG capsule Take 1 capsule (20 mg total) by mouth daily. 02/15/16   Saguier, Percell Miller, PA-C  rosuvastatin (CRESTOR) 10 MG tablet TAKE 1 TABLET BY MOUTH DAILY 06/12/17   Saguier, Percell Miller, PA-C  tamsulosin (FLOMAX) 0.4 MG CAPS capsule TAKE ONE CAPSULE BY MOUTH DAILY 04/10/17   Saguier, Percell Miller, PA-C    Family History Family History  Problem Relation Age of Onset  . Hypertension Mother   . Cancer Mother        brain (possibly started in lung)  . Heart disease Mother   . Diabetes Mother   . Other Mother        cardiomegaly  . Hypertension Father   . Emphysema Father   . Heart Problems Brother        pacemaker  . Heart Problems Other        "using 10% of heart"    Social History Social History   Tobacco Use  . Smoking status: Never Smoker  . Smokeless tobacco: Never Used  Substance Use Topics  . Alcohol use: Yes    Comment: occasional  . Drug use: No     Allergies   Lactose intolerance (gi)   Review of Systems Review of Systems  Constitutional: Negative for chills and fever.  HENT: Negative for ear pain and sore throat.   Eyes: Negative for pain and visual disturbance.  Respiratory: Negative for shortness of breath.   Cardiovascular: Negative for chest pain.  Gastrointestinal: Positive for nausea (resolved). Negative for abdominal pain and vomiting.  Genitourinary: Negative for flank pain.  Musculoskeletal: Negative for back pain.  Skin: Negative for color change and rash.  Neurological: Positive for light-headedness (resolved). Negative for dizziness,  weakness, numbness and headaches.  All other systems reviewed and are negative.   Physical Exam Updated Vital Signs BP (!) 157/102   Pulse 74   Temp 98.3 F (36.8 C) (Oral)   Resp 18   Ht 6\' 4"  (1.93 m)   Wt 103.5 kg   SpO2 100%   BMI 27.77 kg/m   Physical Exam  Constitutional: He appears well-developed and well-nourished.  HENT:  Head: Normocephalic and atraumatic.  Eyes: Conjunctivae are normal.  Neck: Neck supple.  Cardiovascular: Normal rate and regular rhythm.  No murmur heard. Pulmonary/Chest: Effort normal and breath sounds normal. No respiratory distress.  Abdominal: Soft. There is no tenderness.  Musculoskeletal: He exhibits no edema.  Neurological: He is alert.  Mental Status:  Alert, thought content appropriate, able to give a coherent history. Speech fluent  without evidence of aphasia. Able to follow 2 step commands without difficulty.  Cranial Nerves:  II:  pupils equal, round, reactive to light III,IV, VI: ptosis not present, extra-ocular motions intact bilaterally  V,VII: smile symmetric, facial light touch sensation equal VIII: hearing grossly normal to voice  X: uvula elevates symmetrically  XI: bilateral shoulder shrug symmetric and strong XII: midline tongue extension without fassiculations Motor:  Normal tone. 5/5 strength of BUE and BLE major muscle groups including strong and equal grip strength and dorsiflexion/plantar flexion Sensory: light touch normal in all extremities. Cerebellar: normal finger-to-nose with bilateral upper extremities, normal heel to shin Gait: normal gait and balance.  Negative pronator drift, negative romberg  Skin: Skin is warm and dry.  Psychiatric: He has a normal mood and affect.  Nursing note and vitals reviewed.   ED Treatments / Results  Labs (all labs ordered are listed, but only abnormal results are displayed) Labs Reviewed - No data to display  EKG None  Radiology No results  found.  Procedures Procedures (including critical care time)  Medications Ordered in ED Medications - No data to display   Initial Impression / Assessment and Plan / ED Course  I have reviewed the triage vital signs and the nursing notes.  Pertinent labs & imaging results that were available during my care of the patient were reviewed by me and considered in my medical decision making (see chart for details).  Discussed pt presentation and exam findings with Dr. Regenia Skeeter, who personally evaluated the pt and agrees with the plan to discharge pt and have him take his home BP medications when he arrives home.   Final Clinical Impressions(s) / ED Diagnoses   Final diagnoses:  Hypertension, unspecified type   Patient presenting to the ED today for evaluation of hypertension after not taking his blood pressure medications this morning.  Reportedly had an episode where he felt mild lightheadedness and nausea.  At that time noted blood pressure to be elevated at 619 systolic.  Since then all symptoms have resolved and his blood pressure has improved on arrival to the ED to 157/102.  Otherwise vitals are stable.  As above, denies any symptoms in the ED.  Neurologic exam is within normal limits.  Cardiac and pulmonary exams benign. Do not suspect hypertensive emergency/urgency at this time. Less likely other emergent process requiring admission or further workup in the ED. Advised patient to take his regularly scheduled blood pressure medications.  Advised to follow-up with PCP and return to the ER for any worsening symptoms in the meantime.  All questions answered and patient voiced understanding plan reasons to return.  ED Discharge Orders    None       Rodney Booze, PA-C 09/26/17 1641    Rodney Booze, PA-C 09/26/17 1642    Sherwood Gambler, MD 09/26/17 [phone]

## 2017-09-26 NOTE — ED Notes (Signed)
Pt verbalizes understanding of d/c instructions and denies any further needs at this time. 

## 2017-09-26 NOTE — ED Triage Notes (Signed)
Pt c/o h/a and increased BP , hx of HTN

## 2017-09-26 NOTE — Discharge Instructions (Addendum)
Please take your regularly scheduled blood pressure medications when you arrive home today.  Please make an appointment to follow-up with your regular doctor in 1 week for reevaluation.  Please return to the ER for any new or worsening symptoms in the meantime.

## 2017-09-27 MED FILL — CARVEDILOL 6.25 MG TABLET: 6.25 | 30 days supply | Qty: 60 | Fill #0

## 2017-09-27 NOTE — Telephone Encounter (Signed)
Pt. Requesting carvedilol refill. LOV with PCP 01/2017, last refill of carvedilol placed 04/10/17, #60, 3 RF. Please advise on quantity and if need for f/u OV. Order left pended for PCP review.

## 2017-09-27 NOTE — Telephone Encounter (Signed)
Refilled pt carvedilol.

## 2017-10-22 ENCOUNTER — Telehealth: Payer: Self-pay | Admitting: Cardiology

## 2017-10-22 NOTE — Telephone Encounter (Signed)
LMOVM reminding pt to send remote transmission.   

## 2017-10-23 ENCOUNTER — Encounter: Payer: Self-pay | Admitting: Medical

## 2017-10-23 ENCOUNTER — Ambulatory Visit: Payer: Medicare Other | Admitting: Medical

## 2017-10-23 ENCOUNTER — Ambulatory Visit (INDEPENDENT_AMBULATORY_CARE_PROVIDER_SITE_OTHER): Payer: Medicare Other | Admitting: Medical

## 2017-10-23 VITALS — BP 165/90 | HR 70 | Temp 98.3°F | Resp 16 | Ht 76.0 in | Wt 225.6 lb

## 2017-10-23 DIAGNOSIS — I10 Essential (primary) hypertension: Secondary | ICD-10-CM | POA: Diagnosis not present

## 2017-10-23 LAB — COMPREHENSIVE METABOLIC PANEL
ALT: 28 U/L (ref 0–53)
AST: 21 U/L (ref 0–37)
Albumin: 4 g/dL (ref 3.5–5.2)
Alkaline Phosphatase: 63 U/L (ref 39–117)
BUN: 14 mg/dL (ref 6–23)
CALCIUM: 9.8 mg/dL (ref 8.4–10.5)
CO2: 27 meq/L (ref 19–32)
Chloride: 106 mEq/L (ref 96–112)
Creatinine, Ser: 1.04 mg/dL (ref 0.40–1.50)
GFR: 91 mL/min (ref >60.00)
GLUCOSE: 87 mg/dL (ref 70–99)
POTASSIUM: 4.2 meq/L (ref 3.5–5.1)
Sodium: 140 mEq/L (ref 135–145)
Total Bilirubin: 0.9 mg/dL (ref 0.2–1.2)
Total Protein: 7.2 g/dL (ref 6.0–8.3)

## 2017-10-23 MED ORDER — LISINOPRIL 40 MG PO TABS: 40.0000 mg | ORAL_TABLET | Freq: Every day | ORAL | 3 refills | Status: DC

## 2017-10-23 MED FILL — LISINOPRIL 40 MG TABLET: 40 | 30 days supply | Qty: 30 | Fill #0

## 2017-10-23 NOTE — Progress Notes (Signed)
Subjective:    Patient ID: Phillip Booth, male    DOB: May 30, 1950, 67 y.o.   MRN: [id number]  HPI Patient is in today to discuss blood pressure readings/levels.  Patient is on lisinopril 20 mg daily, Lasix 20mg  daily, Coreg 6.5mg  twice daily and amlodipine 10mg  daily.   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  He states that he did not take his blood pressure medicines this morning.  Pt states he has to get DOT exam now and then repeat January 2020.  Pt is aware that bp limit is 140/90. He has bp cuff at home he get varying bp levels with his bp cuff at home. Some times sysolic 563-875 range.    On review pt has good kidney function.    Review of Systems  Constitutional: Negative for chills, fatigue and fever.  HENT: Negative for dental problem, ear pain, mouth sores and postnasal drip.   Respiratory: Negative for cough, chest tightness, shortness of breath and wheezing.   Cardiovascular: Negative for chest pain and palpitations.  Gastrointestinal: Negative for abdominal distention, anal bleeding and constipation.  Genitourinary: Negative for dysuria and enuresis.  Musculoskeletal: Negative for back pain, myalgias and neck pain.  Skin: Negative for rash.  Neurological: Negative for dizziness, seizures, weakness and headaches.  Hematological: Negative for adenopathy. Does not bruise/bleed easily.  Psychiatric/Behavioral: Negative for behavioral problems, confusion and sleep disturbance. The patient is not nervous/anxious.     Past Medical History:  Diagnosis Date  . Allergy   . CAD (coronary artery disease)    a. moderate by cath 03/2014  . Cataract   . CVA (cerebral infarction)    a. diagnosis not clear  . Hyperlipidemia   . Hypertension   . Non-ischemic cardiomyopathy ([place])    a. EF 45% by echo 03/2014  . Paroxysmal atrial fibrillation ([place]) 05/01/2014   asymptomatic, documented on PPM  interrogation,  chads2vasc score is at least 6.  He is contemplating anticoagulation  . Symptomatic bradycardia    a. s/p STJ CRTP implanted by Dr Rayann Heman  . Vertigo      Social History   Socioeconomic History  . Marital status: Married    Spouse name: Not on file  . Number of children: Not on file  . Years of education: Not on file  . Highest education level: Not on file  Occupational History  . Not on file  Social Needs  . Financial resource strain: Not on file  . Food insecurity:    Worry: Not on file    Inability: Not on file  . Transportation needs:    Medical: Not on file    Non-medical: Not on file  Tobacco Use  . Smoking status: Never Smoker  . Smokeless tobacco: Never Used  Substance and Sexual Activity  . Alcohol use: Yes    Comment: occasional  . Drug use: No  . Sexual activity: Yes  Lifestyle  . Physical activity:    Days per week: Not on file    Minutes per session: Not on file  . Stress: Not on file  Relationships  . Social connections:    Talks on phone: Not on file    Gets together: Not on file    Attends religious service: Not on file    Active member of club or organization: Not on file    Attends meetings of clubs or organizations: Not on  file    Relationship status: Not on file  . Intimate partner violence:    Fear of current or ex partner: Not on file    Emotionally abused: Not on file    Physically abused: Not on file    Forced sexual activity: Not on file  Other Topics Concern  . Not on file  Social History Narrative  . Not on file    Past Surgical History:  Procedure Laterality Date  . ABDOMINAL SURGERY    . APPENDECTOMY    . BI-VENTRICULAR PACEMAKER INSERTION N/A 03/19/2014   STJ CRTP implanted by Dr Rayann Heman  . EYE SURGERY     Cataract removed from Right eye  . HERNIA REPAIR    . LEFT HEART CATHETERIZATION WITH CORONARY ANGIOGRAM N/A 03/19/2014   Procedure: LEFT HEART CATHETERIZATION WITH CORONARY ANGIOGRAM;  Surgeon: [name], MD;  Location: [facility] CATH LAB;  Service: Cardiovascular;  Laterality: N/A;    Family History  Problem Relation Age of Onset  . Hypertension Mother   . Cancer Mother        brain (possibly started in lung)  . Heart disease Mother   . Diabetes Mother   . Other Mother        cardiomegaly  . Hypertension Father   . Emphysema Father   . Heart Problems Brother        pacemaker  . Heart Problems Other        "using 10% of heart"    Allergies  Allergen Reactions  . Lactose Intolerance (Gi) Hives and Nausea And Vomiting    Current Outpatient Medications on File Prior to Visit  Medication Sig Dispense Refill  . albuterol (PROVENTIL HFA;VENTOLIN HFA) 108 (90 Base) MCG/ACT inhaler Inhale 2 puffs into the lungs every 6 (six) hours as needed for wheezing or shortness of breath. 1 Inhaler 2  . albuterol (VENTOLIN HFA) 108 (90 Base) MCG/ACT inhaler INHALE 2 PUFFS INTO THE LUNGS EVERY 6 (SIX) HOURS AS NEEDED FOR WHEEZING OR SHORTNESS OF BREATH. 18 Inhaler 0  . amLODipine (NORVASC) 10 MG tablet TAKE 1 TABLET (10 MG TOTAL) BY MOUTH DAILY. 90 tablet 1  . aspirin 81 MG tablet Take 81 mg by mouth daily.     Marland Kitchen azelastine (ASTELIN) 0.1 % nasal spray Place 2 sprays into both nostrils 2 (two) times daily. Use in each nostril as directed 30 mL 3  . carvedilol (COREG) 6.25 MG tablet TAKE 1 TABLET (6.25 MG TOTAL) BY MOUTH 2 (TWO) TIMES DAILY WITH A MEAL. 60 tablet 3  . Cetirizine HCl (ZYRTEC ALLERGY) 10 MG CAPS Take 1 capsule (10 mg total) by mouth daily. 30 capsule 0  . fluticasone (FLONASE) 50 MCG/ACT nasal spray Place 2 sprays into both nostrils daily. 16 g 2  . fluticasone (FLOVENT HFA) 110 MCG/ACT inhaler Inhale 2 puffs into the lungs 2 (two) times daily. 1 Inhaler 3  . furosemide (LASIX) 20 MG tablet TAKE 1 TABLET BY MOUTH DAILY 90 tablet 2  . omeprazole (PRILOSEC) 20 MG capsule Take 1 capsule (20 mg total) by mouth daily. 90 capsule 1  . rosuvastatin (CRESTOR) 10 MG tablet TAKE 1 TABLET BY MOUTH  DAILY 90 tablet 0  . tamsulosin (FLOMAX) 0.4 MG CAPS capsule TAKE ONE CAPSULE BY MOUTH DAILY 90 capsule 1   No current facility-administered medications on file prior to visit.     BP (!) 165/90   Pulse 70   Temp 98.3 F (36.8 C) (Oral)   Resp 16  Ht 6\' 4"  (1.93 m)   Wt 225 lb 9.6 oz (102.3 kg)   SpO2 100%   BMI 27.46 kg/m       Objective:   Physical Exam  General Mental Status- Alert. General Appearance- Not in acute distress.   Skin General: Color- Normal Color. Moisture- Normal Moisture.  Neck Carotid Arteries- Normal color. Moisture- Normal Moisture. No carotid bruits. No JVD.  Chest and Lung Exam Auscultation: Breath Sounds:-Normal.  Cardiovascular Auscultation:Rythm- Regular. Murmurs & Other Heart Sounds:Auscultation of the heart reveals- No Murmurs.  . Neurologic Cranial Nerve exam:- CN III-XII intact(No nystagmus), symmetric smile. Strength:- 5/5 equal and symmetric strength both upper and lower extremities.      Assessment & Plan:  Your bp is moderate elevated presently and want you to stay on same meds you are currently on but will increase lisinopril to 40 mg daily.  Recommend you come in for nurse bp check and pulse check on friday. If bp not better by Friday may need to increase coreg if pulse level allow.  Follow up nurse bp visit on Friday or as needed  25 minute spent with pt today. Counseled on approach to get  his bp controlled to a point that would meet dot requirements.

## 2017-10-23 NOTE — Progress Notes (Signed)
No ICM remote transmission received for 10/22/2017 and next ICM transmission scheduled for 11/19/2017.

## 2017-10-23 NOTE — Patient Instructions (Addendum)
Your bp is moderate elevated presently and want you to stay on same meds you are currently on but will increase lisinopril to 40 mg daily.  Recommend you come in for nurse bp check and pulse check on friday. If bp not better by Friday may need to increase coreg if pulse level allow.(Clonidine would be option but concern for sedation side effect)  Follow up nurse bp visit on Friday or as needed

## 2017-10-26 ENCOUNTER — Ambulatory Visit (INDEPENDENT_AMBULATORY_CARE_PROVIDER_SITE_OTHER): Payer: Medicare Other | Admitting: Medical

## 2017-10-26 ENCOUNTER — Encounter: Payer: Self-pay | Admitting: Medical

## 2017-10-26 VITALS — BP 138/88 | HR 61 | Temp 98.9°F | Resp 16 | Ht 76.0 in | Wt 225.8 lb

## 2017-10-26 DIAGNOSIS — I10 Essential (primary) hypertension: Secondary | ICD-10-CM

## 2017-10-26 MED FILL — ROSUVASTATIN CALCIUM 10 MG: 10 | 30 days supply | Qty: 30 | Fill #1

## 2017-10-26 NOTE — Progress Notes (Signed)
Subjective:    Patient ID: Phillip Booth, male    DOB: Jan 23, 1950, 67 y.o.   MRN: [id number]  HPI  Pt in for follow up on his blood pressure reading.  Pt has DOT exam coming up and he needs bp to be lesser than 140/90. No cardiac or neurologic signs.   I did increase lisinopril to 40 mg a day.  Pt is starting to exercise.  Pt is scheduled to take DOT exam on Monday or Tuesday.    Review of Systems  Constitutional: Negative for chills, fatigue and fever.  Respiratory: Negative for cough, chest tightness, shortness of breath and wheezing.   Cardiovascular: Negative for chest pain and palpitations.  Gastrointestinal: Negative for abdominal pain, nausea and vomiting.  Musculoskeletal: Negative for back pain.  Skin: Negative for rash.  Neurological: Negative for dizziness, speech difficulty, weakness and light-headedness.  Hematological: Negative for adenopathy. Does not bruise/bleed easily.  Psychiatric/Behavioral: Negative for behavioral problems, confusion and sleep disturbance.    Past Medical History:  Diagnosis Date  . Allergy   . CAD (coronary artery disease)    a. moderate by cath 03/2014  . Cataract   . CVA (cerebral infarction)    a. diagnosis not clear  . Hyperlipidemia   . Hypertension   . Non-ischemic cardiomyopathy ([place])    a. EF 45% by echo 03/2014  . Paroxysmal atrial fibrillation ([place]) 05/01/2014   asymptomatic, documented on PPM interrogation,  chads2vasc score is at least 6.  He is contemplating anticoagulation  . Symptomatic bradycardia    a. s/p STJ CRTP implanted by Dr Rayann Heman  . Vertigo      Social History   Socioeconomic History  . Marital status: Married    Spouse name: Not on file  . Number of children: Not on file  . Years of education: Not on file  . Highest education level: Not on file  Occupational History  . Not on file  Social Needs  . Financial resource strain: Not on file  . Food insecurity:    Worry: Not on file    Inability:  Not on file  . Transportation needs:    Medical: Not on file    Non-medical: Not on file  Tobacco Use  . Smoking status: Never Smoker  . Smokeless tobacco: Never Used  Substance and Sexual Activity  . Alcohol use: Yes    Comment: occasional  . Drug use: No  . Sexual activity: Yes  Lifestyle  . Physical activity:    Days per week: Not on file    Minutes per session: Not on file  . Stress: Not on file  Relationships  . Social connections:    Talks on phone: Not on file    Gets together: Not on file    Attends religious service: Not on file    Active member of club or organization: Not on file    Attends meetings of clubs or organizations: Not on file    Relationship status: Not on file  . Intimate partner violence:    Fear of current or ex partner: Not on file    Emotionally abused: Not on file    Physically abused: Not on file    Forced sexual activity: Not on file  Other Topics Concern  . Not on file  Social History Narrative  . Not on file    Past Surgical History:  Procedure Laterality Date  . ABDOMINAL SURGERY    . APPENDECTOMY    . BI-VENTRICULAR PACEMAKER  INSERTION N/A 03/19/2014   STJ CRTP implanted by Dr Rayann Heman  . EYE SURGERY     Cataract removed from Right eye  . HERNIA REPAIR    . LEFT HEART CATHETERIZATION WITH CORONARY ANGIOGRAM N/A 03/19/2014   Procedure: LEFT HEART CATHETERIZATION WITH CORONARY ANGIOGRAM;  Surgeon: [name], MD;  Location: [facility] CATH LAB;  Service: Cardiovascular;  Laterality: N/A;    Family History  Problem Relation Age of Onset  . Hypertension Mother   . Cancer Mother        brain (possibly started in lung)  . Heart disease Mother   . Diabetes Mother   . Other Mother        cardiomegaly  . Hypertension Father   . Emphysema Father   . Heart Problems Brother        pacemaker  . Heart Problems Other        "using 10% of heart"    Allergies  Allergen Reactions  . Lactose Intolerance (Gi) Hives and Nausea And Vomiting     Current Outpatient Medications on File Prior to Visit  Medication Sig Dispense Refill  . albuterol (PROVENTIL HFA;VENTOLIN HFA) 108 (90 Base) MCG/ACT inhaler Inhale 2 puffs into the lungs every 6 (six) hours as needed for wheezing or shortness of breath. 1 Inhaler 2  . albuterol (VENTOLIN HFA) 108 (90 Base) MCG/ACT inhaler INHALE 2 PUFFS INTO THE LUNGS EVERY 6 (SIX) HOURS AS NEEDED FOR WHEEZING OR SHORTNESS OF BREATH. 18 Inhaler 0  . amLODipine (NORVASC) 10 MG tablet TAKE 1 TABLET (10 MG TOTAL) BY MOUTH DAILY. 90 tablet 1  . aspirin 81 MG tablet Take 81 mg by mouth daily.     Marland Kitchen azelastine (ASTELIN) 0.1 % nasal spray Place 2 sprays into both nostrils 2 (two) times daily. Use in each nostril as directed 30 mL 3  . carvedilol (COREG) 6.25 MG tablet TAKE 1 TABLET (6.25 MG TOTAL) BY MOUTH 2 (TWO) TIMES DAILY WITH A MEAL. 60 tablet 3  . Cetirizine HCl (ZYRTEC ALLERGY) 10 MG CAPS Take 1 capsule (10 mg total) by mouth daily. 30 capsule 0  . fluticasone (FLONASE) 50 MCG/ACT nasal spray Place 2 sprays into both nostrils daily. 16 g 2  . fluticasone (FLOVENT HFA) 110 MCG/ACT inhaler Inhale 2 puffs into the lungs 2 (two) times daily. 1 Inhaler 3  . furosemide (LASIX) 20 MG tablet TAKE 1 TABLET BY MOUTH DAILY 90 tablet 2  . lisinopril (PRINIVIL,ZESTRIL) 40 MG tablet Take 1 tablet (40 mg total) by mouth daily. 30 tablet 3  . omeprazole (PRILOSEC) 20 MG capsule Take 1 capsule (20 mg total) by mouth daily. 90 capsule 1  . rosuvastatin (CRESTOR) 10 MG tablet TAKE 1 TABLET BY MOUTH DAILY 90 tablet 0  . tamsulosin (FLOMAX) 0.4 MG CAPS capsule TAKE ONE CAPSULE BY MOUTH DAILY 90 capsule 1   No current facility-administered medications on file prior to visit.     BP 138/88   Pulse 61   Temp 98.9 F (37.2 C) (Oral)   Resp 16   Ht 6\' 4"  (1.93 m)   Wt 225 lb 12.8 oz (102.4 kg)   SpO2 100%   BMI 27.49 kg/m       Objective:   Physical Exam  General Mental Status- Alert. General Appearance- Not in  acute distress.   Skin General: Color- Normal Color. Moisture- Normal Moisture.  Neck Carotid Arteries- Normal color. Moisture- Normal Moisture. No carotid bruits. No JVD.  Chest and Lung Exam  Auscultation: Breath Sounds:-Normal.  Cardiovascular Auscultation:Rythm- Regular. Murmurs & Other Heart Sounds:Auscultation of the heart reveals- No Murmurs.    Neurologic Cranial Nerve exam:- CN III-XII intact(No nystagmus), symmetric smile. Strength:- 5/5 equal and symmetric strength both upper and lower extremities.      Assessment & Plan:  Your blood pressure is well controlled today.  Your blood pressure may improve over the next couple of days since you have only been on the higher dose of lisinopril for a couple of days.  I still would reduce her salt intake and avoid any caffeinated beverages.  Try to get some daily exercise.  Regarding your upcoming DOT exam recommend that you arrive to that office early and if you can try to lay supine on examining table before he gets her blood pressure checked.  Hopefully your blood pressure will meet the requirements.  Also remember to take all blood pressure medications including the furosemide.  Follow-up 3 to 4 months or as needed.  [name], PA-C

## 2017-10-26 NOTE — Patient Instructions (Signed)
Your blood pressure is well controlled today.  Your blood pressure may improve over the next couple of days since you have only been on the higher dose of lisinopril for a couple of days.  I still would reduce her salt intake and avoid any caffeinated beverages.  Try to get some daily exercise.  Regarding your upcoming DOT exam recommend that you arrive to that office early and if you can try to lay supine on examining table before he gets her blood pressure checked.  Hopefully your blood pressure will meet the requirements.  Also remember to take all blood pressure medications including the furosemide.  Follow-up 3 to 4 months or as needed.

## 2017-11-05 MED FILL — CARVEDILOL 6.25 MG TABLET: 6.25 | 30 days supply | Qty: 60 | Fill #1

## 2017-11-19 ENCOUNTER — Ambulatory Visit (INDEPENDENT_AMBULATORY_CARE_PROVIDER_SITE_OTHER): Payer: Medicare Other

## 2017-11-19 ENCOUNTER — Telehealth: Payer: Self-pay | Admitting: Cardiology

## 2017-11-19 ENCOUNTER — Encounter: Payer: Medicare Other | Admitting: *Deleted

## 2017-11-19 DIAGNOSIS — I5022 Chronic systolic (congestive) heart failure: Secondary | ICD-10-CM

## 2017-11-19 DIAGNOSIS — Z95810 Presence of automatic (implantable) cardiac defibrillator: Secondary | ICD-10-CM

## 2017-11-19 NOTE — Telephone Encounter (Signed)
Attempted to confirm remote transmission with pt. No answer and was unable to leave a message.   

## 2017-11-21 ENCOUNTER — Encounter: Payer: Self-pay | Admitting: Cardiology

## 2017-11-22 ENCOUNTER — Ambulatory Visit (INDEPENDENT_AMBULATORY_CARE_PROVIDER_SITE_OTHER): Payer: Medicare Other | Admitting: *Deleted

## 2017-11-22 DIAGNOSIS — R001 Bradycardia, unspecified: Secondary | ICD-10-CM | POA: Diagnosis not present

## 2017-11-22 DIAGNOSIS — I428 Other cardiomyopathies: Secondary | ICD-10-CM

## 2017-11-22 NOTE — Progress Notes (Signed)
Remote pacemaker transmission.   

## 2017-11-23 NOTE — Progress Notes (Signed)
EPIC Encounter for ICM Monitoring  Patient Name: Phillip Booth is a 67 y.o. male Date: 11/23/2017 Primary Care Physican: [name], PA-C Primary Cardiologist:Skains Electrophysiologist: Allred Bi-V Pacing: >99%  Last Weight: 225lbs Today's Weight: unknown       Attempted call to patient and unable to reach.  Left message, per DPR.  Transmission reviewed.    Thoracic impedance normal.   Prescribed: Furosemide 20 mg 1 tablet daily  Labs: 10/23/2017 Creatinine 1.04, BUN 14, Potassium 4.2, Sodium 140, EGFR 91  Recommendations: Left voice mail with ICM number and encouraged to call if experiencing any fluid symptoms..  Follow-up plan: ICM clinic phone appointment on 12/24/2017.    Copy of ICM check sent to Dr. Rayann Heman.   3 month ICM trend: 11/22/2017    1 Year ICM trend:       Rosalene Billings, RN 11/23/2017 7:50 AM

## 2017-11-23 NOTE — Progress Notes (Signed)
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  Advised to call Dr Marlou Porch office for appointment to discuss BP.  He will call next week for appt

## 2017-12-21 MED FILL — CARVEDILOL 6.25 MG TABLET: 6.25 | 30 days supply | Qty: 60 | Fill #2

## 2017-12-21 MED FILL — ROSUVASTATIN CALCIUM 10 MG: 10 | 30 days supply | Qty: 30 | Fill #2

## 2017-12-24 ENCOUNTER — Telehealth: Payer: Self-pay | Admitting: Cardiology

## 2017-12-24 ENCOUNTER — Ambulatory Visit (INDEPENDENT_AMBULATORY_CARE_PROVIDER_SITE_OTHER): Payer: Medicare Other

## 2017-12-24 DIAGNOSIS — Z95810 Presence of automatic (implantable) cardiac defibrillator: Secondary | ICD-10-CM

## 2017-12-24 DIAGNOSIS — I5022 Chronic systolic (congestive) heart failure: Secondary | ICD-10-CM

## 2017-12-24 NOTE — Progress Notes (Signed)
EPIC Encounter for ICM Monitoring  Patient Name: Phillip Booth is a 67 y.o. male Date: 12/24/2017 Primary Care Physican: [name], PA-C Primary Cardiologist:Skains Electrophysiologist: Allred Bi-V Pacing: >99%   Last Weight: 225lbs Today's Weight: unknown                                                   Transmission reviewed.    Thoracic impedance normal.   Prescribed: Furosemide 20 mg 1 tablet daily  Labs: 10/23/2017 Creatinine 1.04, BUN 14, Potassium 4.2, Sodium 140, EGFR 91  Recommendations: None  Follow-up plan: ICM clinic phone appointment on 01/28/2018.  Office appt with Dr Marlou Porch 01/10/2018.  Copy of ICM check sent to Dr. Rayann Heman.   3 month ICM trend: 12/24/2017    1 Year ICM trend:       Rosalene Billings, RN 12/24/2017 3:02 PM

## 2017-12-24 NOTE — Telephone Encounter (Signed)
Spoke with pt and reminded pt of remote transmission that is due today. Pt verbalized understanding.   

## 2018-01-10 ENCOUNTER — Other Ambulatory Visit: Payer: Self-pay | Admitting: Medical

## 2018-01-10 ENCOUNTER — Ambulatory Visit (INDEPENDENT_AMBULATORY_CARE_PROVIDER_SITE_OTHER): Payer: Medicare Other | Admitting: Cardiology

## 2018-01-10 ENCOUNTER — Encounter: Payer: Self-pay | Admitting: *Deleted

## 2018-01-10 ENCOUNTER — Encounter: Payer: Self-pay | Admitting: Cardiology

## 2018-01-10 VITALS — BP 142/84 | HR 60 | Ht 76.0 in | Wt 227.0 lb

## 2018-01-10 DIAGNOSIS — Z950 Presence of cardiac pacemaker: Secondary | ICD-10-CM

## 2018-01-10 DIAGNOSIS — I471 Supraventricular tachycardia: Secondary | ICD-10-CM | POA: Diagnosis not present

## 2018-01-10 DIAGNOSIS — I441 Atrioventricular block, second degree: Secondary | ICD-10-CM | POA: Diagnosis not present

## 2018-01-10 MED ORDER — ISOSORBIDE MONONITRATE ER 30 MG PO TB24: 30.0000 mg | ORAL_TABLET | Freq: Every day | ORAL | 3 refills | Status: DC

## 2018-01-10 MED FILL — FLUTICASONE PROP 50 MCG SPR: 50 | 30 days supply | Qty: 16 | Fill #1

## 2018-01-10 MED FILL — ISOSORBIDE MN ER 30 MG TAB: 30 | 90 days supply | Qty: 90 | Fill #0

## 2018-01-10 NOTE — Patient Instructions (Signed)
Medication Instructions:  Please start Isosorbide 30 mg daily.  Continue all other medications as listed.  If you need a refill on your cardiac medications before your next appointment, please call your pharmacy.   Follow-Up: At [facility], you and your health needs are our priority.  As part of our continuing mission to provide you with exceptional heart care, we have created designated Provider Care Teams.  These Care Teams include your primary Cardiologist (physician) and Advanced Practice Providers (APPs -  Physician Assistants and Nurse Practitioners) who all work together to provide you with the care you need, when you need it. You will need a follow up appointment in 12 months.  Please call our office 2 months in advance to schedule this appointment.  You may see Dr Candee Furbish. or one of the following Advanced Practice Providers on your designated Care Team:   Truitt Merle, NP Cecilie Kicks, NP . Kathyrn Drown, NP  Thank you for choosing [facility]!!

## 2018-01-10 NOTE — Telephone Encounter (Signed)
Patient came into the office today!

## 2018-01-10 NOTE — Progress Notes (Signed)
Cardiology Office Note:    Date:  01/10/2018   ID:  Phillip Booth, DOB [birth year], MRN [id number]  PCP:  [name], PA-C  Cardiologist:  No primary care provider on file.  Electrophysiologist:  None   Referring MD: [name], PA-C     History of Present Illness:    Phillip Booth is a 68 y.o. male here for follow-up of biventricular pacemaker placed in the setting of nonischemic cardiomyopathy (fairly moderate CAD noted on catheterization 2016), echocardiogram 10/07/2015 showed normalization of EF with resynchronization therapy, with prior episodes of atrial tachycardia but no atrial fibrillation, hence no anticoagulation.  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  He has not had any syncopal episodes no anginal symptoms no fevers chills nausea vomiting syncope, no significant shortness of breath.  He does not have a defibrillator.  As a stated above, ejection fraction has returned to normal.  Has had some weight gain, carbs. Gym   Past Medical History:  Diagnosis Date  . Allergy   . CAD (coronary artery disease)    a. moderate by cath 03/2014  . Cataract   . CVA (cerebral infarction)    a. diagnosis not clear  . Hyperlipidemia   . Hypertension   . Non-ischemic cardiomyopathy ([place])    a. EF 45% by echo 03/2014  . Paroxysmal atrial fibrillation ([place]) 05/01/2014   asymptomatic, documented on PPM interrogation,  chads2vasc score is at least 6.  He is contemplating anticoagulation  . Symptomatic bradycardia    a. s/p STJ CRTP implanted by Dr Rayann Heman  . Vertigo     Past Surgical History:  Procedure Laterality Date  . ABDOMINAL SURGERY    . APPENDECTOMY    . BI-VENTRICULAR PACEMAKER INSERTION N/A 03/19/2014   STJ CRTP implanted by Dr Rayann Heman  . EYE SURGERY     Cataract removed from Right eye  . HERNIA REPAIR    . LEFT HEART CATHETERIZATION WITH CORONARY ANGIOGRAM N/A 03/19/2014   Procedure: LEFT HEART CATHETERIZATION WITH CORONARY ANGIOGRAM;  Surgeon: [name], MD;   Location: [facility] CATH LAB;  Service: Cardiovascular;  Laterality: N/A;    Current Medications: Current Meds  Medication Sig  . albuterol (PROVENTIL HFA;VENTOLIN HFA) 108 (90 Base) MCG/ACT inhaler Inhale 2 puffs into the lungs every 6 (six) hours as needed for wheezing or shortness of breath.  Marland Kitchen albuterol (VENTOLIN HFA) 108 (90 Base) MCG/ACT inhaler INHALE 2 PUFFS INTO THE LUNGS EVERY 6 (SIX) HOURS AS NEEDED FOR WHEEZING OR SHORTNESS OF BREATH.  Marland Kitchen amLODipine (NORVASC) 10 MG tablet TAKE 1 TABLET (10 MG TOTAL) BY MOUTH DAILY.  Marland Kitchen aspirin 81 MG tablet Take 81 mg by mouth daily.   Marland Kitchen azelastine (ASTELIN) 0.1 % nasal spray Place 2 sprays into both nostrils 2 (two) times daily. Use in each nostril as directed  . carvedilol (COREG) 6.25 MG tablet TAKE 1 TABLET (6.25 MG TOTAL) BY MOUTH 2 (TWO) TIMES DAILY WITH A MEAL.  Marland Kitchen Cetirizine HCl (ZYRTEC ALLERGY) 10 MG CAPS Take 1 capsule (10 mg total) by mouth daily.  . fluticasone (FLONASE) 50 MCG/ACT nasal spray Place 2 sprays into both nostrils daily.  . fluticasone (FLOVENT HFA) 110 MCG/ACT inhaler Inhale 2 puffs into the lungs 2 (two) times daily.  . furosemide (LASIX) 20 MG tablet TAKE 1 TABLET BY MOUTH DAILY  . lisinopril (PRINIVIL,ZESTRIL) 40 MG tablet Take 1 tablet (40 mg total) by mouth daily.  . rosuvastatin (CRESTOR) 10 MG tablet TAKE 1 TABLET BY MOUTH DAILY  . tamsulosin (  FLOMAX) 0.4 MG CAPS capsule TAKE ONE CAPSULE BY MOUTH DAILY     Allergies:   Lactose intolerance (gi)   Social History   Socioeconomic History  . Marital status: Married    Spouse name: Not on file  . Number of children: Not on file  . Years of education: Not on file  . Highest education level: Not on file  Occupational History  . Not on file  Social Needs  . Financial resource strain: Not on file  . Food insecurity:    Worry: Not on file    Inability: Not on file  . Transportation needs:    Medical: Not on file    Non-medical: Not on file  Tobacco Use  . Smoking  status: Never Smoker  . Smokeless tobacco: Never Used  Substance and Sexual Activity  . Alcohol use: Yes    Comment: occasional  . Drug use: No  . Sexual activity: Yes  Lifestyle  . Physical activity:    Days per week: Not on file    Minutes per session: Not on file  . Stress: Not on file  Relationships  . Social connections:    Talks on phone: Not on file    Gets together: Not on file    Attends religious service: Not on file    Active member of club or organization: Not on file    Attends meetings of clubs or organizations: Not on file    Relationship status: Not on file  Other Topics Concern  . Not on file  Social History Narrative  . Not on file     Family History: The patient's family history includes Cancer in his mother; Diabetes in his mother; Emphysema in his father; Heart Problems in his brother and another family member; Heart disease in his mother; Hypertension in his father and mother; Other in his mother.  ROS:   Please see the history of present illness.     All other systems reviewed and are negative.  EKGs/Labs/Other Studies Reviewed:    The following studies were reviewed today:  ECHO 05/01/17: - Left ventricle: Wall thickness was increased in a pattern of   moderate to severe LVH. Systolic function was normal. The   estimated ejection fraction was in the range of 55% to 60%. Wall   motion was normal; there were no regional wall motion   abnormalities. Findings consistent with left ventricular   diastolic dysfunction, grade indeterminate. - Aortic valve: There was mild regurgitation. - Mitral valve: Mildly thickened leaflets . There was mild   regurgitation.  EKG: EKG from 02/19/2017 shows AV pacing, biventricular pacing.    Recent Labs: 10/23/2017: ALT 28; BUN 14; Creatinine, Ser 1.04; Potassium 4.2; Sodium 140  Recent Lipid Panel    Component Value Date/Time   CHOL 175 10/24/2016 1559   TRIG 160.0 (H) 10/24/2016 1559   HDL 55.80 10/24/2016  1559   CHOLHDL 3 10/24/2016 1559   VLDL 32.0 10/24/2016 1559   LDLCALC 87 10/24/2016 1559    Physical Exam:    VS:  BP (!) 142/84   Pulse 60   Ht 6\' 4"  (1.93 m)   Wt 227 lb (103 kg)   SpO2 98%   BMI 27.63 kg/m     Wt Readings from Last 3 Encounters:  01/10/18 227 lb (103 kg)  10/26/17 225 lb 12.8 oz (102.4 kg)  10/23/17 225 lb 9.6 oz (102.3 kg)     GEN:  Well nourished, well developed in no acute  distress HEENT: Normal NECK: No JVD; No carotid bruits LYMPHATICS: No lymphadenopathy CARDIAC: RRR, no murmurs, rubs, gallops, pacer RESPIRATORY:  Clear to auscultation without rales, wheezing or rhonchi  ABDOMEN: Soft, non-tender, non-distended MUSCULOSKELETAL:  No edema; No deformity  SKIN: Warm and dry NEUROLOGIC:  Alert and oriented x 3 PSYCHIATRIC:  Normal affect   ASSESSMENT:    1. Atrial tachycardia ([place])   2. Second degree AV block, Mobitz type II   3. Pacemaker    PLAN:    In order of problems listed above:  Biventricular pacemaker, CRT, 2016 Saint Jude symptomatic second-degree Mobitz 2 heart block -Functioning well, Dr. Jackalyn Lombard notes reviewed. - Since acquiring this therapy, prior history of non-ischemic cardiomyopathy ejection fraction has returned to normal.  Excellent.  Has prior history of nonischemic cardiomyopathy.  No syncope, anginal symptoms, no arrhythmias.  Paroxysmal atrial tachycardia -No recent episodes, no atrial fibrillation.  Doing well.  No anticoagulation needed.  Nonobstructive coronary artery disease - Cardiac catheterization 03/19/2014 by Dr. Claiborne Billings: Coronary obstructive disease with 30-40% first diagonal stenosis, 20% mid LAD stenosis, 40% second diagonal stenosis of the LAD; 20% mid circumflex stenosis with distal.  Branch stenoses of 90 and 95% in very distal small vessels; and 20% mid RCA stenosis with 70% ostial stenosis and RV marginal branch. -Continue with secondary prevention efforts  - IMDUR 30mg  PO QD. we will try this to see  if this helps with an occasional sensation of chest fullness.  Ultimately, stress test was low risk.  We discussed weight loss and exercise.  Nuclear stress test 02/22/2017 -Overall low risk with no evidence of ischemia.  EF on echocardiogram 05/01/2017 was normal.  LVH noted.  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  Medication Adjustments/Labs and Tests Ordered: Current medicines are reviewed at length with the patient today.  Concerns regarding medicines are outlined above.  No orders of the defined types were placed in this encounter.  Meds ordered this encounter  Medications  . isosorbide mononitrate (IMDUR) 30 MG 24 hr tablet    Sig: Take 1 tablet (30 mg total) by mouth daily.    Dispense:  90 tablet    Refill:  3    Patient Instructions  Medication Instructions:  Please start Isosorbide 30 mg daily.  Continue all other medications as listed.  If you need a refill on your cardiac medications before your next appointment, please call your pharmacy.   Follow-Up: At [facility], you and your health needs are our priority.  As part of our continuing mission to provide you with exceptional heart care, we have created designated Provider Care Teams.  These Care Teams include your primary Cardiologist (physician) and Advanced Practice Providers (APPs -  Physician Assistants and Nurse Practitioners) who all work together to provide you with the care you need, when you need it. You will need a follow up appointment in 12 months.  Please call our office 2 months in advance to schedule this appointment.  You may see Dr Candee Furbish. or one of the following Advanced Practice Providers on your designated Care Team:   Truitt Merle, NP Cecilie Kicks, NP . Kathyrn Drown, NP  Thank you for choosing [facility]!!         Signed, Candee Furbish, MD  01/10/2018 10:27 AM     [place]

## 2018-01-11 ENCOUNTER — Ambulatory Visit: Payer: Medicare Other | Admitting: Medical

## 2018-01-11 DIAGNOSIS — Z0289 Encounter for other administrative examinations: Secondary | ICD-10-CM

## 2018-01-11 MED FILL — TAMSULOSIN HCL 0.4 MG CAP: 0.4 | 90 days supply | Qty: 90 | Fill #0

## 2018-01-14 MED FILL — ROSUVASTATIN CALCIUM 10 MG: 10 | 30 days supply | Qty: 30 | Fill #0

## 2018-01-22 LAB — CUP PACEART REMOTE DEVICE CHECK
Brady Statistic AP VP Percent: 97 %
Brady Statistic AS VP Percent: 2.9 %
Brady Statistic AS VS Percent: 1 %
Implantable Lead Implant Date: 20160310
Implantable Lead Implant Date: 20160310
Implantable Lead Location: 753858
Implantable Lead Model: 1948
Lead Channel Impedance Value: 560 Ohm
Lead Channel Pacing Threshold Amplitude: 0.75 V
Lead Channel Pacing Threshold Amplitude: 0.75 V
Lead Channel Pacing Threshold Pulse Width: 0.5 ms
Lead Channel Pacing Threshold Pulse Width: 0.5 ms
Lead Channel Setting Pacing Amplitude: 2.5 V
Lead Channel Setting Pacing Amplitude: 2.5 V
Lead Channel Setting Sensing Sensitivity: 2 mV
MDC IDC LEAD IMPLANT DT: 20160310
MDC IDC LEAD LOCATION: 753859
MDC IDC LEAD LOCATION: 753860
MDC IDC MSMT BATTERY REMAINING LONGEVITY: 82 mo
MDC IDC MSMT BATTERY REMAINING PERCENTAGE: 95.5 %
MDC IDC MSMT BATTERY VOLTAGE: 2.96 V
MDC IDC MSMT LEADCHNL LV IMPEDANCE VALUE: 730 Ohm
MDC IDC MSMT LEADCHNL RA IMPEDANCE VALUE: 450 Ohm
MDC IDC MSMT LEADCHNL RA PACING THRESHOLD AMPLITUDE: 1 V
MDC IDC MSMT LEADCHNL RA PACING THRESHOLD PULSEWIDTH: 0.5 ms
MDC IDC MSMT LEADCHNL RA SENSING INTR AMPL: 3.1 mV
MDC IDC MSMT LEADCHNL RV SENSING INTR AMPL: 12 mV
MDC IDC PG IMPLANT DT: 20160310
MDC IDC SESS DTM: 20191114090135
MDC IDC SET LEADCHNL LV PACING PULSEWIDTH: 0.5 ms
MDC IDC SET LEADCHNL RA PACING AMPLITUDE: 2 V
MDC IDC SET LEADCHNL RV PACING PULSEWIDTH: 0.5 ms
MDC IDC STAT BRADY AP VS PERCENT: 1 %
MDC IDC STAT BRADY RA PERCENT PACED: 97 %
Pulse Gen Model: 3242
Pulse Gen Serial Number: 7724805

## 2018-01-28 ENCOUNTER — Telehealth: Payer: Self-pay

## 2018-01-28 ENCOUNTER — Ambulatory Visit (INDEPENDENT_AMBULATORY_CARE_PROVIDER_SITE_OTHER): Payer: Medicare Other

## 2018-01-28 DIAGNOSIS — Z95810 Presence of automatic (implantable) cardiac defibrillator: Secondary | ICD-10-CM

## 2018-01-28 DIAGNOSIS — I5022 Chronic systolic (congestive) heart failure: Secondary | ICD-10-CM | POA: Diagnosis not present

## 2018-01-28 NOTE — Telephone Encounter (Signed)
Remote ICM transmission received.  Attempted call to patient regarding ICM remote transmission and left message, per DPR, to return call.    

## 2018-01-28 NOTE — Progress Notes (Signed)
EPIC Encounter for ICM Monitoring  Patient Name: Phillip Booth is a 68 y.o. male Date: 01/28/2018 Primary Care Physican: [name], PA-C Primary Cardiologist:Skains Electrophysiologist: Allred Bi-V Pacing: >99% Last Weight:225lbs Today's Weight: unknown  Attempted call to patient.  Left message to return call. Transmission reviewed.    Thoracic impedance normal.   Prescribed:Furosemide 20 mg 1 tablet daily  Labs: 10/15/2019Creatinine 1.04, BUN 14, Potassium4.2, Sodium140, EGFR91  Recommendations:Unable to reach.  Follow-up plan: ICM clinic phone appointment on2/24/2020.   Copy of ICM check sent to Dr.Allred.   3 month ICM trend: 01/28/2018    1 Year ICM trend:       Rosalene Billings, RN 01/28/2018 4:21 PM

## 2018-01-29 NOTE — Progress Notes (Signed)
Patient returned call.  Transmission reviewed.  He reported he is doing well and feeling good.  Denied any complaints.  Advised to call for any fluid accumulation symptoms.  Next remote scheduled for 03/04/2018.

## 2018-01-31 MED FILL — CARVEDILOL 6.25 MG TABLET: 6.25 | 30 days supply | Qty: 60 | Fill #3

## 2018-02-21 ENCOUNTER — Ambulatory Visit (INDEPENDENT_AMBULATORY_CARE_PROVIDER_SITE_OTHER): Payer: Medicare Other

## 2018-02-21 DIAGNOSIS — R001 Bradycardia, unspecified: Secondary | ICD-10-CM

## 2018-02-22 ENCOUNTER — Telehealth: Payer: Self-pay

## 2018-02-22 NOTE — Telephone Encounter (Signed)
Left message for patient to remind of missed remote transmission.  

## 2018-02-26 LAB — CUP PACEART REMOTE DEVICE CHECK
Battery Remaining Percentage: 95.5 %
Battery Voltage: 2.96 V
Brady Statistic AP VP Percent: 97 %
Brady Statistic AS VP Percent: 2.7 %
Implantable Lead Implant Date: 20160310
Implantable Lead Implant Date: 20160310
Implantable Lead Location: 753858
Lead Channel Impedance Value: 560 Ohm
Lead Channel Pacing Threshold Amplitude: 1 V
Lead Channel Pacing Threshold Pulse Width: 0.5 ms
Lead Channel Pacing Threshold Pulse Width: 0.5 ms
Lead Channel Sensing Intrinsic Amplitude: 2.7 mV
Lead Channel Setting Pacing Amplitude: 2.5 V
Lead Channel Setting Pacing Amplitude: 2.5 V
Lead Channel Setting Pacing Pulse Width: 0.5 ms
MDC IDC LEAD IMPLANT DT: 20160310
MDC IDC LEAD LOCATION: 753859
MDC IDC LEAD LOCATION: 753860
MDC IDC MSMT BATTERY REMAINING LONGEVITY: 82 mo
MDC IDC MSMT LEADCHNL LV IMPEDANCE VALUE: 680 Ohm
MDC IDC MSMT LEADCHNL LV PACING THRESHOLD AMPLITUDE: 0.75 V
MDC IDC MSMT LEADCHNL RA IMPEDANCE VALUE: 450 Ohm
MDC IDC MSMT LEADCHNL RV PACING THRESHOLD AMPLITUDE: 0.75 V
MDC IDC MSMT LEADCHNL RV PACING THRESHOLD PULSEWIDTH: 0.5 ms
MDC IDC MSMT LEADCHNL RV SENSING INTR AMPL: 12 mV
MDC IDC PG IMPLANT DT: 20160310
MDC IDC SESS DTM: 20200214171839
MDC IDC SET LEADCHNL RA PACING AMPLITUDE: 2 V
MDC IDC SET LEADCHNL RV PACING PULSEWIDTH: 0.5 ms
MDC IDC SET LEADCHNL RV SENSING SENSITIVITY: 2 mV
MDC IDC STAT BRADY AP VS PERCENT: 1 %
MDC IDC STAT BRADY AS VS PERCENT: 1 %
MDC IDC STAT BRADY RA PERCENT PACED: 97 %
Pulse Gen Model: 3242
Pulse Gen Serial Number: 7724805

## 2018-03-04 ENCOUNTER — Ambulatory Visit (INDEPENDENT_AMBULATORY_CARE_PROVIDER_SITE_OTHER): Payer: Medicare Other

## 2018-03-04 DIAGNOSIS — Z95810 Presence of automatic (implantable) cardiac defibrillator: Secondary | ICD-10-CM

## 2018-03-04 DIAGNOSIS — I5022 Chronic systolic (congestive) heart failure: Secondary | ICD-10-CM

## 2018-03-05 ENCOUNTER — Other Ambulatory Visit: Payer: Self-pay | Admitting: Medical

## 2018-03-05 DIAGNOSIS — I10 Essential (primary) hypertension: Secondary | ICD-10-CM

## 2018-03-05 MED FILL — LISINOPRIL 40 MG TABLET: 40 | 30 days supply | Qty: 30 | Fill #1

## 2018-03-05 NOTE — Progress Notes (Signed)
EPIC Encounter for ICM Monitoring  Patient Name: Phillip Booth is a 68 y.o. male Date: 03/05/2018 Primary Care Physican: [name], PA-C Primary Cardiologist:Skains Electrophysiologist: Allred Bi-V Pacing: >99% Last Weight:225lbs Today's Weight: unknown  Attempted call to patient.  Transmission reviewed.    Thoracic impedance normal.   Prescribed:Furosemide 20 mg 1 tablet daily  Labs: 10/15/2019Creatinine 1.04, BUN 14, Potassium4.2, Sodium140, EGFR91  Recommendations:Unable to reach.  Follow-up plan: ICM clinic phone appointment on3/30/2020.Recall appt 02/19/2018 with Chanetta Marshall, NP  Copy of ICM check sent to Dr.Allred.  3 month ICM trend: 03/04/2018    1 Year ICM trend:       Rosalene Billings, RN 03/05/2018 9:38 AM

## 2018-03-05 NOTE — Progress Notes (Signed)
Remote pacemaker transmission.   

## 2018-03-07 MED FILL — CARVEDILOL 6.25 MG TABLET: 6.25 | 30 days supply | Qty: 60 | Fill #0

## 2018-04-08 ENCOUNTER — Other Ambulatory Visit: Payer: Self-pay

## 2018-04-08 ENCOUNTER — Ambulatory Visit (INDEPENDENT_AMBULATORY_CARE_PROVIDER_SITE_OTHER): Payer: Medicare Other

## 2018-04-08 DIAGNOSIS — Z95810 Presence of automatic (implantable) cardiac defibrillator: Secondary | ICD-10-CM | POA: Diagnosis not present

## 2018-04-08 DIAGNOSIS — I5022 Chronic systolic (congestive) heart failure: Secondary | ICD-10-CM | POA: Diagnosis not present

## 2018-04-08 NOTE — Progress Notes (Signed)
EPIC Encounter for ICM Monitoring  Patient Name: Phillip Booth is a 68 y.o. male Date: 04/08/2018 Primary Care Physican: [name], PA-C Primary Cardiologist:Skains Electrophysiologist: Allred Bi-V Pacing: >99% Last Weight:225lbs Today's Weight: unknown  Heart failure questions reviewed.  Pt asymptomatic for fluid accumulation.  Patient is concerned about the COVID 19 and advised to monitor for symptoms and if he develops symptoms to call his PCP or ER if needed.  Thoracic impedance normal.   Prescribed:Furosemide 20 mg 1 tablet daily  Labs: 10/15/2019Creatinine 1.04, BUN 14, Potassium4.2, Sodium140, [id number]  Recommendations: No changes and encouraged to call for fluid symptoms.   Follow-up plan: ICM clinic phone appointment on5/04/2018.  Copy of ICM check sent to Dr.Allred.  3 month ICM trend: 04/08/2018    1 Year ICM trend:       Rosalene Billings, RN 04/08/2018 4:04 PM

## 2018-04-13 MED FILL — ROSUVASTATIN CALCIUM 10 MG: 10 | 30 days supply | Qty: 30 | Fill #1

## 2018-04-13 MED FILL — LISINOPRIL 40 MG TABLET: 40 | 30 days supply | Qty: 30 | Fill #2

## 2018-04-13 MED FILL — CARVEDILOL 6.25 MG TABLET: 6.25 | 30 days supply | Qty: 60 | Fill #1

## 2018-04-17 ENCOUNTER — Telehealth: Payer: Self-pay | Admitting: Medical

## 2018-04-17 NOTE — Telephone Encounter (Signed)
How is pt doing. I ran into him near pharamacy about a month ago. He was having problem with statin prescription. How is he? Depending on how he is doing could offer virtual visit. Does he smart phone. Could offer doxy.me

## 2018-04-17 NOTE — Telephone Encounter (Signed)
Spoke with pt and schedule Doxy visit for tomorrow at 2pm.

## 2018-04-18 ENCOUNTER — Ambulatory Visit (INDEPENDENT_AMBULATORY_CARE_PROVIDER_SITE_OTHER): Payer: Medicare Other | Admitting: Medical

## 2018-04-18 ENCOUNTER — Other Ambulatory Visit: Payer: Self-pay

## 2018-04-18 ENCOUNTER — Encounter: Payer: Self-pay | Admitting: Medical

## 2018-04-18 DIAGNOSIS — E785 Hyperlipidemia, unspecified: Secondary | ICD-10-CM

## 2018-04-18 DIAGNOSIS — J301 Allergic rhinitis due to pollen: Secondary | ICD-10-CM | POA: Diagnosis not present

## 2018-04-18 MED ORDER — LEVOCETIRIZINE DIHYDROCHLORIDE 5 MG PO TABS: 5.0000 mg | ORAL_TABLET | Freq: Every evening | ORAL | 0 refills | Status: DC

## 2018-04-18 MED ORDER — FLUTICASONE PROPIONATE 50 MCG/ACT NA SUSP: 2.0000 | Freq: Every day | NASAL | 1 refills | Status: DC

## 2018-04-18 MED ORDER — ROSUVASTATIN CALCIUM 10 MG PO TABS: 10.0000 mg | ORAL_TABLET | Freq: Every day | ORAL | 11 refills | Status: AC

## 2018-04-18 NOTE — Progress Notes (Signed)
   Subjective:    Patient ID: Phillip Booth, male    DOB: [birth year], 68 y.o.   MRN: [id number]  HPI  Virtual Visit via Video Note  I connected with Phillip Booth on 04/18/18 at  2:00 PM EDT by a video enabled telemedicine application and verified that I am speaking with the correct person using two identifiers.   I discussed the limitations of evaluation and management by telemedicine and the availability of in person appointments. The patient expressed understanding and agreed to proceed.  Video portion fine  but audio very poor so had to call pt.    History of Present Illness: Pt give me update today that crestor was too high at our pharmacy. But he states at [facility] price was much less. Will cost him $9 dollars([facility] price about $45). Will prescribe 30 tabs with 11 refills. No cardiac or neurologic signs or symptoms. No vitals checked today.  Pt also update me that he had allergy flare about 2 weeks ago. Some nasal congestion and post nasal drainage. He ran out of flonase. Runny nose and pnd. No fever. No wheezing or sob. With cough he had he had concerns that might have covid but now he is feeling better.     Observations/Objective:  No acute distress. Assessment and Plan:  For your high cholesterol, I did send in generic Crestor to have her see her pharmacy.  This is much better price and did provide refills.   For htn continue current medications.Will check bp in office once covid restriction are gone/when he is in for office visit. In the past I had asked him to get electronic bp cuff and check at home periodically.  For allergic rhinitis rx flonase and xyzal.  Counseled on covid sign and symptoms. Asked him to check temperature if feels feverish. Any temp 100.4 or above notify us.  Follow up date to be determined. Follow Up Instructions:    I discussed the assessment and treatment plan with the patient. The patient was provided an opportunity to ask questions  and all were answered. The patient agreed with the plan and demonstrated an understanding of the instructions.   The patient was advised to call back or seek an in-person evaluation if the symptoms worsen or if the condition fails to improve as anticipated.  I provided  25 minutes of non-face-to-face time during this encounter. 50% of time spent counseling on crestor refill and allergy treatment. Also counseled on covid. Educated on signs and symptoms to watch for.   [name], PA-C   Review of Systems  Constitutional: Negative for chills, fatigue and fever.  HENT: Positive for congestion. Negative for ear pain, facial swelling, mouth sores, postnasal drip, rhinorrhea, sore throat, trouble swallowing and voice change.   Respiratory: Positive for cough. Negative for chest tightness, shortness of breath and wheezing.   Cardiovascular: Negative for chest pain and palpitations.  Gastrointestinal: Negative for abdominal pain, constipation and rectal pain.  Genitourinary: Negative for difficulty urinating and enuresis.  Musculoskeletal: Negative for back pain, myalgias and neck stiffness.  Skin: Negative for rash.  Neurological: Negative for dizziness, speech difficulty, weakness, numbness and headaches.  Hematological: Negative for adenopathy. Does not bruise/bleed easily.  Psychiatric/Behavioral: Negative for behavioral problems, confusion and sleep disturbance. The patient is not nervous/anxious.        Objective:   Physical Exam  nad      Assessment & Plan:

## 2018-04-18 NOTE — Patient Instructions (Addendum)
For your high cholesterol, I did send in generic Crestor to have her see her pharmacy.  This is much better price and did provide refills.   For htn continue current medications.Will check bp in office once covid restriction are gone/when he is in for office visit. In the past I had asked him to get electronic bp cuff and check at home periodically.  For allergic rhinitis rx flonase and xyzal.  Counseled on covid sign and symptoms. Asked him to check temperature if feels feverish. Any temp 100.4 or above notify us.  Follow up date to be determined.

## 2018-05-13 ENCOUNTER — Other Ambulatory Visit: Payer: Self-pay

## 2018-05-13 ENCOUNTER — Ambulatory Visit (INDEPENDENT_AMBULATORY_CARE_PROVIDER_SITE_OTHER): Payer: Medicare Other

## 2018-05-13 DIAGNOSIS — Z95810 Presence of automatic (implantable) cardiac defibrillator: Secondary | ICD-10-CM

## 2018-05-13 DIAGNOSIS — I5022 Chronic systolic (congestive) heart failure: Secondary | ICD-10-CM

## 2018-05-14 NOTE — Progress Notes (Signed)
EPIC Encounter for ICM Monitoring  Patient Name: Phillip Booth is a 68 y.o. male Date: 05/14/2018 Primary Care Physican: [name], PA-C Primary Cardiologist:Skains Electrophysiologist: Allred Bi-V Pacing: >99% Last Weight:225lbs   Transmission reviewed.  Thoracic impedance normal.   Prescribed:Furosemide 20 mg 1 tablet daily  Labs: 10/15/2019Creatinine 1.04, BUN 14, Potassium4.2, Sodium140, OEVO35  Recommendations: No changes.   Follow-up plan: ICM clinic phone appointment on6/08/2018.  Copy of ICM check sent to Dr.Allred.   3 month ICM trend: 05/13/2018    1 Year ICM trend:       Rosalene Billings, RN 05/14/2018 5:16 PM

## 2018-05-17 NOTE — Progress Notes (Addendum)
Virtual Visit via Video Note  I connected with patient on 05/20/18 at  3:00 PM EDT by a video enabled telemedicine application and verified that I am speaking with the correct person using two identifiers.   THIS ENCOUNTER IS A VIRTUAL VISIT DUE TO COVID-19 - PATIENT WAS NOT SEEN IN THE OFFICE. PATIENT HAS CONSENTED TO VIRTUAL VISIT / TELEMEDICINE VISIT   Location of patient: home  Location of provider: office  I discussed the limitations of evaluation and management by telemedicine and the availability of in person appointments. The patient expressed understanding and agreed to proceed.   Subjective:   Phillip Booth is a 68 y.o. male who presents for Medicare Annual/Subsequent preventive examination.  Review of Systems: No ROS.  Medicare Wellness Virtual Visit.  Visual/audio telehealth visit, UTA vital signs.   See social history for additional risk factors.  Cardiac Risk Factors include: advanced age (>9men, >79 women);family history of premature cardiovascular disease;hypertension;male gender Sleep patterns: no issues Home Safety/Smoke Alarms: Feels safe in home. Smoke alarms in place.  Lives with wife in 2nd floor apt.  Male:   CCS- pt unsure of time. Will defer to PCP .     PSA-  Lab Results  Component Value Date   PSA 0.78 04/14/2014       Objective:    Vitals: Unable to assess. This visit is enabled though telemedicine due to Covid 19.   Advanced Directives 05/20/2018 09/26/2017 05/14/2017 03/06/2016 01/07/2016 12/25/2014 03/17/2014  Does Patient Have a Medical Advance Directive? No No No No No No No  Would patient like information on creating a medical advance directive? No - Patient declined - No - Patient declined No - Patient declined Yes (MAU/Ambulatory/Procedural Areas - Information given) - -    Tobacco Social History   Tobacco Use  Smoking Status Never Smoker  Smokeless Tobacco Never Used     Counseling given: Not Answered   Clinical Intake:      Pain : No/denies pain                 Past Medical History:  Diagnosis Date  . Allergy   . CAD (coronary artery disease)    a. moderate by cath 03/2014  . Cataract   . CVA (cerebral infarction)    a. diagnosis not clear  . Hyperlipidemia   . Hypertension   . Non-ischemic cardiomyopathy ([place])    a. EF 45% by echo 03/2014  . Paroxysmal atrial fibrillation ([place]) 05/01/2014   asymptomatic, documented on PPM interrogation,  chads2vasc score is at least 6.  He is contemplating anticoagulation  . Symptomatic bradycardia    a. s/p STJ CRTP implanted by Dr Rayann Heman  . Vertigo    Past Surgical History:  Procedure Laterality Date  . ABDOMINAL SURGERY    . APPENDECTOMY    . BI-VENTRICULAR PACEMAKER INSERTION N/A 03/19/2014   STJ CRTP implanted by Dr Rayann Heman  . EYE SURGERY     Cataract removed from Right eye  . HERNIA REPAIR    . LEFT HEART CATHETERIZATION WITH CORONARY ANGIOGRAM N/A 03/19/2014   Procedure: LEFT HEART CATHETERIZATION WITH CORONARY ANGIOGRAM;  Surgeon: [name], MD;  Location: [facility] CATH LAB;  Service: Cardiovascular;  Laterality: N/A;   Family History  Problem Relation Age of Onset  . Hypertension Mother   . Cancer Mother        brain (possibly started in lung)  . Heart disease Mother   . Diabetes Mother   . Other Mother  cardiomegaly  . Hypertension Father   . Emphysema Father   . Heart Problems Brother        pacemaker  . Heart Problems Other        "using 10% of heart"   Social History   Socioeconomic History  . Marital status: Married    Spouse name: Not on file  . Number of children: Not on file  . Years of education: Not on file  . Highest education level: Not on file  Occupational History  . Not on file  Social Needs  . Financial resource strain: Not on file  . Food insecurity:    Worry: Not on file    Inability: Not on file  . Transportation needs:    Medical: Not on file    Non-medical: Not on file  Tobacco Use  . Smoking  status: Never Smoker  . Smokeless tobacco: Never Used  Substance and Sexual Activity  . Alcohol use: Yes    Comment: occasional  . Drug use: No  . Sexual activity: Yes  Lifestyle  . Physical activity:    Days per week: Not on file    Minutes per session: Not on file  . Stress: Not on file  Relationships  . Social connections:    Talks on phone: Not on file    Gets together: Not on file    Attends religious service: Not on file    Active member of club or organization: Not on file    Attends meetings of clubs or organizations: Not on file    Relationship status: Not on file  Other Topics Concern  . Not on file  Social History Narrative  . Not on file    Outpatient Encounter Medications as of 05/20/2018  Medication Sig  . albuterol (PROVENTIL HFA;VENTOLIN HFA) 108 (90 Base) MCG/ACT inhaler Inhale 2 puffs into the lungs every 6 (six) hours as needed for wheezing or shortness of breath.  Marland Kitchen amLODipine (NORVASC) 10 MG tablet TAKE 1 TABLET (10 MG TOTAL) BY MOUTH DAILY.  Marland Kitchen aspirin 81 MG tablet Take 81 mg by mouth daily.   Marland Kitchen azelastine (ASTELIN) 0.1 % nasal spray Place 2 sprays into both nostrils 2 (two) times daily. Use in each nostril as directed  . carvedilol (COREG) 6.25 MG tablet TAKE 1 TABLET (6.25 MG TOTAL) BY MOUTH 2 (TWO) TIMES DAILY WITH A MEAL.  Marland Kitchen Cetirizine HCl (ZYRTEC ALLERGY) 10 MG CAPS Take 1 capsule (10 mg total) by mouth daily.  . fluticasone (FLONASE) 50 MCG/ACT nasal spray Place 2 sprays into both nostrils daily.  . fluticasone (FLOVENT HFA) 110 MCG/ACT inhaler Inhale 2 puffs into the lungs 2 (two) times daily.  . furosemide (LASIX) 20 MG tablet TAKE 1 TABLET BY MOUTH DAILY  . levocetirizine (XYZAL) 5 MG tablet Take 1 tablet (5 mg total) by mouth every evening.  Marland Kitchen lisinopril (PRINIVIL,ZESTRIL) 40 MG tablet Take 1 tablet (40 mg total) by mouth daily.  . rosuvastatin (CRESTOR) 10 MG tablet Take 1 tablet (10 mg total) by mouth daily.  . tamsulosin (FLOMAX) 0.4 MG CAPS  capsule TAKE ONE CAPSULE BY MOUTH DAILY  . albuterol (VENTOLIN HFA) 108 (90 Base) MCG/ACT inhaler INHALE 2 PUFFS INTO THE LUNGS EVERY 6 (SIX) HOURS AS NEEDED FOR WHEEZING OR SHORTNESS OF BREATH. (Patient not taking: Reported on 05/20/2018)  . isosorbide mononitrate (IMDUR) 30 MG 24 hr tablet Take 1 tablet (30 mg total) by mouth daily.  . [DISCONTINUED] fluticasone (FLONASE) 50 MCG/ACT nasal spray Place  2 sprays into both nostrils daily.   No facility-administered encounter medications on file as of 05/20/2018.     Activities of Daily Living In your present state of health, do you have any difficulty performing the following activities: 05/20/2018  Hearing? N  Vision? N  Difficulty concentrating or making decisions? N  Walking or climbing stairs? N  Dressing or bathing? N  Doing errands, shopping? N  Preparing Food and eating ? N  Using the Toilet? N  In the past six months, have you accidently leaked urine? N  Do you have problems with loss of bowel control? N  Managing your Medications? N  Managing your Finances? N  Housekeeping or managing your Housekeeping? N  Some recent data might be hidden    Patient Care Team: Saguier, Iris Pert as PCP - General (Physician Assistant) Patsey Berthold, NP as Nurse Practitioner (Cardiology)   Assessment:   This is a routine wellness examination for [facility]. Physical assessment deferred to PCP.  Exercise Activities and Dietary recommendations Current Exercise Habits: The patient does not participate in regular exercise at present, Exercise limited by: None identified Diet (meal preparation, eat out, water intake, caffeinated beverages, dairy products, fruits and vegetables): well balanced, on average, 3 meals per day   Goals    . Do treadmill x24min and strength training 3x/week.    . Eat a healthy diet with fruit and vegetables.     . Get off of some medications by improving diet and exercise.    . Increase physical activity       Fall  Risk Fall Risk  05/20/2018 05/14/2017 01/07/2016 12/08/2015 11/09/2014  Falls in the past year? 0 No No No No     Depression Screen PHQ 2/9 Scores 05/20/2018 05/14/2017 01/07/2016 12/08/2015  PHQ - 2 Score 0 0 0 0    Cognitive Function Ad8 score reviewed for issues:  Issues making decisions:no  Less interest in hobbies / activities:no  Repeats questions, stories (family complaining):no  Trouble using ordinary gadgets (microwave, computer, phone):no  Forgets the month or year: no  Mismanaging finances: no  Remembering appts:no  Daily problems with thinking and/or memory:no Ad8 score is=0     MMSE - Mini Mental State Exam 01/07/2016  Orientation to time 5  Orientation to Place 5  Registration 3  Attention/ Calculation 5  Recall 3  Language- name 2 objects 2  Language- repeat 1  Language- follow 3 step command 3  Language- read & follow direction 1  Write a sentence 1  Copy design 1  Total score 30        Immunization History  Administered Date(s) Administered  . Influenza,inj,Quad PF,6+ Mos 11/09/2014, 12/13/2016  . Influenza-Unspecified 10/13/2013  . Pneumococcal Polysaccharide-23 03/20/2014   Screening Tests Health Maintenance  Topic Date Due  . Hepatitis C Screening  [birth year]  . COLONOSCOPY  07/26/1998  . PNA vac Low Risk Adult (2 of 2 - PCV13) 03/20/2015  . TETANUS/TDAP  01/09/2017  . INFLUENZA VACCINE  08/10/2018      Plan:   See you next year.   Continue to eat heart healthy diet (full of fruits, vegetables, whole grains, lean protein, water--limit salt, fat, and sugar intake) and increase physical activity as tolerated.  Continue doing brain stimulating activities (puzzles, reading, adult coloring books, staying active) to keep memory sharp.   Bring a copy of your living will and/or healthcare power of attorney to your next office visit.   I have personally reviewed and noted  the following in the patient's chart:   . Medical and social  history . Use of alcohol, tobacco or illicit drugs  . Current medications and supplements . Functional ability and status . Nutritional status . Physical activity . Advanced directives . List of other physicians . Hospitalizations, surgeries, and ER visits in previous 12 months . Vitals . Screenings to include cognitive, depression, and falls . Referrals and appointments  In addition, I have reviewed and discussed with patient certain preventive protocols, quality metrics, and best practice recommendations. A written personalized care plan for preventive services as well as general preventive health recommendations were provided to patient.     Shela Nevin, [place]  05/20/2018   Reviewed and agree with assessment & plan of RN.   [name], PA-C

## 2018-05-20 ENCOUNTER — Encounter: Payer: Self-pay | Admitting: *Deleted

## 2018-05-20 ENCOUNTER — Other Ambulatory Visit: Payer: Self-pay

## 2018-05-20 ENCOUNTER — Ambulatory Visit (INDEPENDENT_AMBULATORY_CARE_PROVIDER_SITE_OTHER): Payer: Medicare Other | Admitting: *Deleted

## 2018-05-20 DIAGNOSIS — Z0000 Encounter for general adult medical examination without abnormal findings: Secondary | ICD-10-CM

## 2018-05-20 NOTE — Patient Instructions (Addendum)
See you next year.   Continue to eat heart healthy diet (full of fruits, vegetables, whole grains, lean protein, water--limit salt, fat, and sugar intake) and increase physical activity as tolerated.  Continue doing brain stimulating activities (puzzles, reading, adult coloring books, staying active) to keep memory sharp.   Bring a copy of your living will and/or healthcare power of attorney to your next office visit.   [name] , Thank you for taking time to come for your Medicare Wellness Visit. I appreciate your ongoing commitment to your health goals. Please review the following plan we discussed and let me know if I can assist you in the future.   These are the goals we discussed: Goals    . Do treadmill x72min and strength training 3x/week.    . Eat a healthy diet with fruit and vegetables.     . Get off of some medications by improving diet and exercise.    . Increase physical activity       This is a list of the screening recommended for you and due dates:  Health Maintenance  Topic Date Due  .  Hepatitis C: One time screening is recommended by Center for Disease Control  (CDC) for  adults born from [birth year] through 1967.   1950/04/20  . Colon Cancer Screening  07/26/1998  . Pneumonia vaccines (2 of 2 - PCV13) 03/20/2015  . Tetanus Vaccine  01/09/2017  . Flu Shot  08/10/2018    Health Maintenance After Age 52 After age 19, you are at a higher risk for certain long-term diseases and infections as well as injuries from falls. Falls are a major cause of broken bones and head injuries in people who are older than age 57. Getting regular preventive care can help to keep you healthy and well. Preventive care includes getting regular testing and making lifestyle changes as recommended by your health care provider. Talk with your health care provider about:  Which screenings and tests you should have. A screening is a test that checks for a disease when you have no symptoms.  A  diet and exercise plan that is right for you. What should I know about screenings and tests to prevent falls? Screening and testing are the best ways to find a health problem early. Early diagnosis and treatment give you the best chance of managing medical conditions that are common after age 68. Certain conditions and lifestyle choices may make you more likely to have a fall. Your health care provider may recommend:  Regular vision checks. Poor vision and conditions such as cataracts can make you more likely to have a fall. If you wear glasses, make sure to get your prescription updated if your vision changes.  Medicine review. Work with your health care provider to regularly review all of the medicines you are taking, including over-the-counter medicines. Ask your health care provider about any side effects that may make you more likely to have a fall. Tell your health care provider if any medicines that you take make you feel dizzy or sleepy.  Osteoporosis screening. Osteoporosis is a condition that causes the bones to get weaker. This can make the bones weak and cause them to break more easily.  Blood pressure screening. Blood pressure changes and medicines to control blood pressure can make you feel dizzy.  Strength and balance checks. Your health care provider may recommend certain tests to check your strength and balance while standing, walking, or changing positions.  Foot health exam. Foot  pain and numbness, as well as not wearing proper footwear, can make you more likely to have a fall.  Depression screening. You may be more likely to have a fall if you have a fear of falling, feel emotionally low, or feel unable to do activities that you used to do.  Alcohol use screening. Using too much alcohol can affect your balance and may make you more likely to have a fall. What actions can I take to lower my risk of falls? General instructions  Talk with your health care provider about your  risks for falling. Tell your health care provider if: ? You fall. Be sure to tell your health care provider about all falls, even ones that seem minor. ? You feel dizzy, sleepy, or off-balance.  Take over-the-counter and prescription medicines only as told by your health care provider. These include any supplements.  Eat a healthy diet and maintain a healthy weight. A healthy diet includes low-fat dairy products, low-fat (lean) meats, and fiber from whole grains, beans, and lots of fruits and vegetables. Home safety  Remove any tripping hazards, such as rugs, cords, and clutter.  Install safety equipment such as grab bars in bathrooms and safety rails on stairs.  Keep rooms and walkways well-lit. Activity   Follow a regular exercise program to stay fit. This will help you maintain your balance. Ask your health care provider what types of exercise are appropriate for you.  If you need a cane or walker, use it as recommended by your health care provider.  Wear supportive shoes that have nonskid soles. Lifestyle  Do not drink alcohol if your health care provider tells you not to drink.  If you drink alcohol, limit how much you have: ? 0-1 drink a day for women. ? 0-2 drinks a day for men.  Be aware of how much alcohol is in your drink. In the [place] one drink equals one typical bottle of beer (12 oz), one-half glass of wine (5 oz), or one shot of hard liquor (1 oz).  Do not use any products that contain nicotine or tobacco, such as cigarettes and e-cigarettes. If you need help quitting, ask your health care provider. Summary  Having a healthy lifestyle and getting preventive care can help to protect your health and wellness after age 38.  Screening and testing are the best way to find a health problem early and help you avoid having a fall. Early diagnosis and treatment give you the best chance for managing medical conditions that are more common for people who are older than age  35.  Falls are a major cause of broken bones and head injuries in people who are older than age 73. Take precautions to prevent a fall at home.  Work with your health care provider to learn what changes you can make to improve your health and wellness and to prevent falls. This information is not intended to replace advice given to you by your health care provider. Make sure you discuss any questions you have with your health care provider. Document Released: 11/08/2016 Document Revised: 11/08/2016 Document Reviewed: 11/08/2016 Elsevier Interactive Patient Education  2019 [employer].

## 2018-05-21 ENCOUNTER — Other Ambulatory Visit: Payer: Self-pay | Admitting: Medical

## 2018-05-21 ENCOUNTER — Other Ambulatory Visit: Payer: Self-pay | Admitting: Nurse Practitioner

## 2018-05-21 ENCOUNTER — Ambulatory Visit: Payer: Medicare Other | Admitting: Medical

## 2018-05-21 MED FILL — CARVEDILOL 6.25 MG TABLET: 6.25 | 30 days supply | Qty: 60 | Fill #2

## 2018-05-21 MED FILL — TAMSULOSIN HCL 0.4 MG CAP: 0.4 | 90 days supply | Qty: 90 | Fill #1

## 2018-05-21 MED FILL — LISINOPRIL 40 MG TABLET: 40 | 30 days supply | Qty: 30 | Fill #3

## 2018-05-22 ENCOUNTER — Ambulatory Visit: Payer: Medicare Other | Admitting: Medical

## 2018-05-22 ENCOUNTER — Other Ambulatory Visit: Payer: Self-pay

## 2018-05-22 MED FILL — FUROSEMIDE 20 MG TAB: 20 | 90 days supply | Qty: 90 | Fill #0

## 2018-05-23 ENCOUNTER — Other Ambulatory Visit: Payer: Self-pay

## 2018-05-23 ENCOUNTER — Ambulatory Visit: Payer: Medicare Other | Admitting: *Deleted

## 2018-05-24 ENCOUNTER — Telehealth: Payer: Self-pay

## 2018-05-24 NOTE — Telephone Encounter (Signed)
Left message for patient to remind of missed remote transmission.  

## 2018-05-27 ENCOUNTER — Encounter: Payer: Self-pay | Admitting: Medical

## 2018-05-27 ENCOUNTER — Ambulatory Visit (HOSPITAL_BASED_OUTPATIENT_CLINIC_OR_DEPARTMENT_OTHER)
Admission: RE | Admit: 2018-05-27 | Discharge: 2018-05-27 | Disposition: A | Discharge status: Home / Self Care | Payer: Medicare Other | Source: Ambulatory Visit | Attending: Medical | Admitting: Medical

## 2018-05-27 ENCOUNTER — Ambulatory Visit (INDEPENDENT_AMBULATORY_CARE_PROVIDER_SITE_OTHER): Payer: Medicare Other | Admitting: Medical

## 2018-05-27 ENCOUNTER — Other Ambulatory Visit: Payer: Self-pay

## 2018-05-27 DIAGNOSIS — E785 Hyperlipidemia, unspecified: Secondary | ICD-10-CM

## 2018-05-27 DIAGNOSIS — Z8679 Personal history of other diseases of the circulatory system: Secondary | ICD-10-CM

## 2018-05-27 DIAGNOSIS — R635 Abnormal weight gain: Secondary | ICD-10-CM

## 2018-05-27 DIAGNOSIS — R0609 Other forms of dyspnea: Secondary | ICD-10-CM | POA: Insufficient documentation

## 2018-05-27 DIAGNOSIS — I10 Essential (primary) hypertension: Secondary | ICD-10-CM

## 2018-05-27 DIAGNOSIS — R05 Cough: Secondary | ICD-10-CM

## 2018-05-27 DIAGNOSIS — R0600 Dyspnea, unspecified: Secondary | ICD-10-CM | POA: Diagnosis not present

## 2018-05-27 DIAGNOSIS — R059 Cough, unspecified: Secondary | ICD-10-CM

## 2018-05-27 MED ORDER — MONTELUKAST SODIUM 10 MG PO TABS: 10.0000 mg | ORAL_TABLET | Freq: Every day | ORAL | 3 refills | Status: DC

## 2018-05-27 NOTE — Progress Notes (Signed)
Subjective:    Patient ID: Phillip Booth, male    DOB: 1950/05/20, 68 y.o.   MRN: [id number]  HPI  Virtual Visit via Video Note  I connected with Phillip Booth on 05/27/18 at 11:00 AM EDT by a video enabled telemedicine application and verified that I am speaking with the correct person using two identifiers.  Location: Patient: home Provider: office   I discussed the limitations of evaluation and management by telemedicine and the availability of in person appointments. The patient expressed understanding and agreed to proceed.  History of Present Illness:  Pt has high blood pressure in past but his machine is not working today. Pt states even with battery change not working. No cardiac or neurologic signs or symptoms.  Pt states that he has had recent post nasal drainage over past weekend. Pt just started taking his flonase. Mild cough with post nasal drainage. No fever, no chills or sweats. Pt just got xyzal filled this weekend.  Pt in past year describes to his cardiologist vague low level chest pain with exercise. He tried isosorbide in past as cardiologist had advised but it caused him to get dizziness and ha. So he stopped medication in January shortly after stopping. Pt states not having any recent significant chest pain.  Pt bp machine and scale not working.    Observations/Objective: General- no acute distress. Pleasant. Lungs- even unlabored on inspection. Lower ext- today he states no leg swelling.(tried to view but when they adjusted camera they turned camera off)  Assessment and Plan: For allergic rhinitis will rx montelukast today, continue xyzal and flonase. This should stop pnd and help with cough. If any cough with fever then notify us and could refer you for testing for covid but not needed now.  For dyspnea on exertion, weight gain and hx of chf will get chest xray today.  For some atypical low level chest discomfort but side effects with isosorbide. I want  him to contact his cardiologist and notify him of this. Ask if he can use lower dose.  If signs and symptoms worsen or change then ED evaluation at [place].  Stress to get bp machine, pulse ox monitor and new scale.  Follow up in 2 weeks or as needed.  [name], PA-C  Follow Up Instructions:    I discussed the assessment and treatment plan with the patient. The patient was provided an opportunity to ask questions and all were answered. The patient agreed with the plan and demonstrated an understanding of the instructions.   The patient was advised to call back or seek an in-person evaluation if the symptoms worsen or if the condition fails to improve as anticipated.  25 minutes spent with patient. 50% of time spent counseling on how we would proceed going forward since he did not give bp, pulse, weight info yet has a lot of risk factors. So had to spend more time counseling.   [name], PA-C    Review of Systems  Constitutional: Negative for chills, fatigue and fever.  Respiratory: Positive for shortness of breath. Negative for cough, chest tightness and wheezing.        Slight shortness of breath with activity.  Musculoskeletal: Negative for back pain.  Neurological: Negative for dizziness, speech difficulty, weakness, numbness and headaches.  Hematological: Negative for adenopathy. Does not bruise/bleed easily.  Psychiatric/Behavioral: Negative for behavioral problems and confusion.       Objective:   Physical Exam        Assessment &  Plan:

## 2018-05-27 NOTE — Telephone Encounter (Signed)
Patient returning call Transferred to Margarita Grizzle

## 2018-05-27 NOTE — Patient Instructions (Addendum)
For allergic rhinitis will rx montelukast today, continue xyzal and flonase. This should stop pnd and help with cough. If any cough with fever then notify us and could refer you for testing for covid but not needed now.  For dyspnea on exertion, weight gain and hx of chf will get chest xray today.  For some atypical low level chest discomfort but side effects with isosorbide. I want him to contact his cardiologist and notify him of this. Ask if he can use lower dose.  If signs and symptoms worsen or change then ED evaluation at [place].  Stress to get bp machine, pulse ox monitor and new scale.  Follow up in 2 weeks or as needed

## 2018-05-27 NOTE — Telephone Encounter (Signed)
Spoke w/ pt and informed him that his remote transmission was not received. He plans to send the transmission later this afternoon.

## 2018-05-29 LAB — CUP PACEART REMOTE DEVICE CHECK
Date Time Interrogation Session: 20200520123356
Implantable Lead Implant Date: 20160310
Implantable Lead Implant Date: 20160310
Implantable Lead Implant Date: 20160310
Implantable Lead Location: 753858
Implantable Lead Location: 753859
Implantable Lead Location: 753860
Implantable Lead Model: 1948
Implantable Pulse Generator Implant Date: 20160310
Pulse Gen Model: 3242
Pulse Gen Serial Number: 7724805

## 2018-06-10 ENCOUNTER — Ambulatory Visit: Payer: Medicare Other | Admitting: Medical

## 2018-06-10 ENCOUNTER — Other Ambulatory Visit: Payer: Self-pay

## 2018-06-17 ENCOUNTER — Ambulatory Visit (INDEPENDENT_AMBULATORY_CARE_PROVIDER_SITE_OTHER): Payer: Medicare Other

## 2018-06-17 DIAGNOSIS — I5022 Chronic systolic (congestive) heart failure: Secondary | ICD-10-CM

## 2018-06-17 DIAGNOSIS — Z95810 Presence of automatic (implantable) cardiac defibrillator: Secondary | ICD-10-CM | POA: Diagnosis not present

## 2018-06-18 NOTE — Progress Notes (Signed)
EPIC Encounter for ICM Monitoring  Patient Name: Phillip Booth is a 68 y.o. male Date: 06/18/2018 Primary Care Physican: [name], PA-C Primary Cardiologist:Skains Electrophysiologist: Allred Bi-V Pacing: >99% 06/19/2018 Weight:220lbs   Spoke with patient. He is asymptomatic. He said he is feeling fine.  Corvue thoracic impedance normal.   Prescribed:Furosemide 20 mg 1 tablet daily  Labs: 10/15/2019Creatinine 1.04, BUN 14, Potassium4.2, Sodium140, GFR91  Recommendations:No changes and encouraged to call if develops fluid symptoms.  Follow-up plan: ICM clinic phone appointment on7/13/2020.  Copy of ICM check sent to Dr.Allred  3 month ICM trend: 06/17/2018    1 Year ICM trend:       Rosalene Billings, RN 06/18/2018 9:13 AM

## 2018-06-26 ENCOUNTER — Other Ambulatory Visit: Payer: Self-pay | Admitting: Medical

## 2018-06-27 MED FILL — LISINOPRIL 40 MG TABLET: 40 | 30 days supply | Qty: 30 | Fill #0

## 2018-07-02 MED FILL — CARVEDILOL 6.25 MG TABLET: 6.25 | 30 days supply | Qty: 60 | Fill #3

## 2018-07-22 ENCOUNTER — Ambulatory Visit (INDEPENDENT_AMBULATORY_CARE_PROVIDER_SITE_OTHER): Payer: Medicare Other

## 2018-07-22 DIAGNOSIS — Z95810 Presence of automatic (implantable) cardiac defibrillator: Secondary | ICD-10-CM

## 2018-07-22 DIAGNOSIS — I5022 Chronic systolic (congestive) heart failure: Secondary | ICD-10-CM | POA: Diagnosis not present

## 2018-07-23 NOTE — Progress Notes (Signed)
EPIC Encounter for ICM Monitoring  Patient Name: Phillip Booth is a 68 y.o. male Date: 07/23/2018 Primary Care Physican: [name], PA-C Primary Cardiologist:Skains Electrophysiologist: Allred Bi-V Pacing: >99% 06/19/2018 Weight:220lbs 07/23/2018 Weight: has not weighed   Spoke with patient. He is asymptomatic. He said he is feeling fine.  Corvue thoracic impedance normal.   Prescribed:Furosemide 20 mg 1 tablet daily  Labs: 10/15/2019Creatinine 1.04, BUN 14, Potassium4.2, Sodium140, GFR91  Recommendations:No changes and encouraged to call if experiencing any fluid symptoms.  Follow-up plan: ICM clinic phone appointment on8/17/2020.  Copy of ICM check sent to Dr.Allred  3 month ICM trend: 07/22/2018    1 Year ICM trend:       Rosalene Billings, RN 07/23/2018 11:12 AM

## 2018-08-06 ENCOUNTER — Other Ambulatory Visit: Payer: Self-pay | Admitting: Medical

## 2018-08-06 DIAGNOSIS — I10 Essential (primary) hypertension: Secondary | ICD-10-CM

## 2018-08-07 MED FILL — CARVEDILOL 6.25 MG TABLET: 6.25 | 30 days supply | Qty: 60 | Fill #0

## 2018-08-08 MED FILL — LISINOPRIL 40 MG TABLET: 40 | 30 days supply | Qty: 30 | Fill #1

## 2018-08-20 ENCOUNTER — Ambulatory Visit (INDEPENDENT_AMBULATORY_CARE_PROVIDER_SITE_OTHER): Payer: Medicare Other | Admitting: Medical

## 2018-08-20 ENCOUNTER — Telehealth: Payer: Self-pay | Admitting: Medical

## 2018-08-20 ENCOUNTER — Other Ambulatory Visit: Payer: Self-pay

## 2018-08-20 ENCOUNTER — Encounter: Payer: Self-pay | Admitting: Medical

## 2018-08-20 ENCOUNTER — Ambulatory Visit (HOSPITAL_BASED_OUTPATIENT_CLINIC_OR_DEPARTMENT_OTHER)
Admission: RE | Admit: 2018-08-20 | Discharge: 2018-08-20 | Disposition: A | Discharge status: Home / Self Care | Payer: Medicare Other | Source: Ambulatory Visit | Attending: Medical | Admitting: Medical

## 2018-08-20 VITALS — BP 148/80 | HR 64 | Temp 96.4°F | Resp 16 | Ht 76.0 in | Wt 234.8 lb

## 2018-08-20 DIAGNOSIS — I10 Essential (primary) hypertension: Secondary | ICD-10-CM

## 2018-08-20 DIAGNOSIS — R0609 Other forms of dyspnea: Secondary | ICD-10-CM | POA: Diagnosis not present

## 2018-08-20 DIAGNOSIS — E785 Hyperlipidemia, unspecified: Secondary | ICD-10-CM | POA: Diagnosis not present

## 2018-08-20 DIAGNOSIS — R0602 Shortness of breath: Secondary | ICD-10-CM | POA: Diagnosis not present

## 2018-08-20 DIAGNOSIS — R739 Hyperglycemia, unspecified: Secondary | ICD-10-CM | POA: Diagnosis not present

## 2018-08-20 DIAGNOSIS — Z8679 Personal history of other diseases of the circulatory system: Secondary | ICD-10-CM | POA: Insufficient documentation

## 2018-08-20 DIAGNOSIS — J3089 Other allergic rhinitis: Secondary | ICD-10-CM

## 2018-08-20 DIAGNOSIS — R0981 Nasal congestion: Secondary | ICD-10-CM | POA: Diagnosis not present

## 2018-08-20 LAB — COMPREHENSIVE METABOLIC PANEL
ALT: 20 U/L (ref 0–53)
AST: 20 U/L (ref 0–37)
Albumin: 4 g/dL (ref 3.5–5.2)
Alkaline Phosphatase: 51 U/L (ref 39–117)
BUN: 10 mg/dL (ref 6–23)
CO2: 30 mEq/L (ref 19–32)
Calcium: 9.8 mg/dL (ref 8.4–10.5)
Chloride: 103 mEq/L (ref 96–112)
Creatinine, Ser: 0.92 mg/dL (ref 0.40–1.50)
GFR: 98.39 mL/min (ref >60.00)
Glucose, Bld: 93 mg/dL (ref 70–99)
Potassium: 4.3 mEq/L (ref 3.5–5.1)
Sodium: 137 mEq/L (ref 135–145)
Total Bilirubin: 1 mg/dL (ref 0.2–1.2)
Total Protein: 7.1 g/dL (ref 6.0–8.3)

## 2018-08-20 LAB — BRAIN NATRIURETIC PEPTIDE: Pro B Natriuretic peptide (BNP): 164 pg/mL — ABNORMAL HIGH (ref 0.0–100.0)

## 2018-08-20 LAB — HEMOGLOBIN A1C: Hgb A1c MFr Bld: 6.5 % (ref 4.6–6.5)

## 2018-08-20 LAB — LIPID PANEL
Cholesterol: 187 mg/dL (ref 0–200)
HDL: 55.2 mg/dL (ref >39.00)
LDL Cholesterol: 111 mg/dL — ABNORMAL HIGH (ref 0–99)
NonHDL: 131.64
Total CHOL/HDL Ratio: 3
Triglycerides: 105 mg/dL (ref 0.0–149.0)
VLDL: 21 mg/dL (ref 0.0–40.0)

## 2018-08-20 MED ORDER — ALBUTEROL SULFATE HFA 108 (90 BASE) MCG/ACT IN AERS: INHALATION_SPRAY | RESPIRATORY_TRACT | 2 refills | Status: DC

## 2018-08-20 MED ORDER — LEVOCETIRIZINE DIHYDROCHLORIDE 5 MG PO TABS: 5.0000 mg | ORAL_TABLET | Freq: Every evening | ORAL | 3 refills | Status: DC

## 2018-08-20 MED ORDER — FLUTICASONE PROPIONATE HFA 110 MCG/ACT IN AERO: 2.0000 | INHALATION_SPRAY | Freq: Two times a day (BID) | RESPIRATORY_TRACT | 3 refills | Status: DC

## 2018-08-20 MED ORDER — METHYLPREDNISOLONE ACETATE 40 MG/ML IJ SUSP
40.0000 mg | Freq: Once | INTRAMUSCULAR | Status: AC
  Administered 2018-08-20: 40 mg via INTRAMUSCULAR

## 2018-08-20 MED ORDER — AZELASTINE HCL 0.1 % NA SOLN: 2.0000 | Freq: Two times a day (BID) | NASAL | 1 refills | Status: DC

## 2018-08-20 MED ORDER — METFORMIN HCL 500 MG PO TABS: ORAL_TABLET | ORAL | 3 refills | Status: DC

## 2018-08-20 MED ORDER — MONTELUKAST SODIUM 10 MG PO TABS: 10.0000 mg | ORAL_TABLET | Freq: Every day | ORAL | 3 refills | Status: AC

## 2018-08-20 MED FILL — MONTELUKAST SOD 10 MG TAB: 10 | 30 days supply | Qty: 30 | Fill #0

## 2018-08-20 MED FILL — LEVOCETIRIZINE 5 MG TABLET: 5 | 30 days supply | Qty: 30 | Fill #0

## 2018-08-20 NOTE — Progress Notes (Signed)
Subjective:    Patient ID: Phillip Booth, male    DOB: Mar 12, 1950, 68 y.o.   MRN: [id number]  HPI  Pt in for follow up.  Pt is staying at home a lot since onset of pandemic. He states he is not working out at home. He is not walking much recently.   Pt has htn. BP better when I checked compared to initial reading. No cardiac or neurologic signs or symptoms. Pt did not take his bp meds today.  Pt has high cholesterol and is on crestor.  Pt has some mild sugar elevation in the past.  Pt is fasting today.  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 Jackalyn Lombard notes reviewed. - Since acquiring this therapy, prior history of non-ischemic cardiomyopathy ejection fraction has returned to normal.  Excellent.  Has prior history of nonischemic cardiomyopathy.  No syncope, anginal symptoms, no arrhythmias.  Paroxysmal atrial tachycardia -No recent episodes, no atrial fibrillation.  Doing well.  No anticoagulation needed.  Nonobstructive coronary artery disease - Cardiac catheterization 03/19/2014 by Dr. Claiborne Billings: Coronary obstructive disease with 30-40% first diagonal stenosis, 20% mid LAD stenosis, 40% second diagonal stenosis of the LAD; 20% mid circumflex stenosis with distal. Branch stenoses of 90 and 95% in very distal small vessels; and 20% mid RCA stenosis with 70% ostial stenosis and RV marginal branch. -Continue with secondary prevention efforts  - IMDUR 30mg  PO QD. we will try this to see if this helps with an occasional sensation of chest fullness.  Ultimately, stress test was low risk.  We discussed weight loss and exercise.  Nuclear stress test 02/22/2017 -Overall low risk with no evidence of ischemia.  EF on echocardiogram 05/01/2017 was normal.  LVH noted.   Hx of chronic nasal congestion throughout the year. No recent worsening. Pt has flonase, montelukast and xyzal. No worse type  symptoms that indicate covid type symptoms.   Some dyspnea on exertion for one year. Last EF was normal per cardiologist report. In the past I had rx'd albuterol and flovent and he statest that they helped.     Review of Systems  Constitutional: Negative for chills, fatigue and fever.  Respiratory: Negative for chest tightness, shortness of breath and wheezing.   Cardiovascular: Negative for chest pain and palpitations.  Gastrointestinal: Negative for abdominal pain, diarrhea, nausea and rectal pain.  Musculoskeletal: Negative for back pain, myalgias and neck stiffness.  Skin: Negative for rash.  Neurological: Negative for dizziness, seizures, speech difficulty, weakness and light-headedness.  Hematological: Negative for adenopathy. Does not bruise/bleed easily.  Psychiatric/Behavioral: Negative for behavioral problems, confusion and sleep disturbance. The patient is not nervous/anxious.     Past Medical History:  Diagnosis Date  . Allergy   . CAD (coronary artery disease)    a. moderate by cath 03/2014  . Cataract   . CVA (cerebral infarction)    a. diagnosis not clear  . Hyperlipidemia   . Hypertension   . Non-ischemic cardiomyopathy ([place])    a. EF 45% by echo 03/2014  . Paroxysmal atrial fibrillation ([place]) 05/01/2014   asymptomatic, documented on PPM interrogation,  chads2vasc score is at least 6.  He is contemplating anticoagulation  . Symptomatic bradycardia    a. s/p STJ CRTP implanted by Dr Rayann Heman  . Vertigo      Social History   Socioeconomic History  . Marital status: Married    Spouse name: Not on file  .  Number of children: Not on file  . Years of education: Not on file  . Highest education level: Not on file  Occupational History  . Not on file  Social Needs  . Financial resource strain: Not on file  . Food insecurity    Worry: Not on file    Inability: Not on file  . Transportation needs    Medical: Not on file    Non-medical: Not on file  Tobacco  Use  . Smoking status: Never Smoker  . Smokeless tobacco: Never Used  Substance and Sexual Activity  . Alcohol use: Yes    Comment: occasional  . Drug use: No  . Sexual activity: Yes  Lifestyle  . Physical activity    Days per week: Not on file    Minutes per session: Not on file  . Stress: Not on file  Relationships  . Social Herbalist on phone: Not on file    Gets together: Not on file    Attends religious service: Not on file    Active member of club or organization: Not on file    Attends meetings of clubs or organizations: Not on file    Relationship status: Not on file  . Intimate partner violence    Fear of current or ex partner: Not on file    Emotionally abused: Not on file    Physically abused: Not on file    Forced sexual activity: Not on file  Other Topics Concern  . Not on file  Social History Narrative  . Not on file    Past Surgical History:  Procedure Laterality Date  . ABDOMINAL SURGERY    . APPENDECTOMY    . BI-VENTRICULAR PACEMAKER INSERTION N/A 03/19/2014   STJ CRTP implanted by Dr Rayann Heman  . EYE SURGERY     Cataract removed from Right eye  . HERNIA REPAIR    . LEFT HEART CATHETERIZATION WITH CORONARY ANGIOGRAM N/A 03/19/2014   Procedure: LEFT HEART CATHETERIZATION WITH CORONARY ANGIOGRAM;  Surgeon: [name], MD;  Location: [facility] C[facility] LAB;  Service: Cardiovascular;  Laterality: N/A;    Family History  Problem Relation Age of Onset  . Hypertension Mother   . Cancer Mother        brain (possibly started in lung)  . Heart disease Mother   . Diabetes Mother   . Other Mother        cardiomegaly  . Hypertension Father   . Emphysema Father   . Heart Problems Brother        pacemaker  . Heart Problems Other        "using 10% of heart"    Allergies  Allergen Reactions  . Lactose Intolerance (Gi) Hives and Nausea And Vomiting    Current Outpatient Medications on File Prior to Visit  Medication Sig Dispense Refill  . albuterol  (VENTOLIN HFA) 108 (90 Base) MCG/ACT inhaler INHALE 2 PUFFS INTO THE LUNGS EVERY 6 (SIX) HOURS AS NEEDED FOR WHEEZING OR SHORTNESS OF BREATH. 18 Inhaler 0  . amLODipine (NORVASC) 10 MG tablet TAKE 1 TABLET (10 MG TOTAL) BY MOUTH DAILY. 90 tablet 1  . aspirin 81 MG tablet Take 81 mg by mouth daily.     . carvedilol (COREG) 6.25 MG tablet TAKE 1 TABLET BY MOUTH TWICE DAILY WITH A MEAL 60 tablet 3  . Cetirizine HCl (ZYRTEC ALLERGY) 10 MG CAPS Take 1 capsule (10 mg total) by mouth daily. 30 capsule 0  . fluticasone (  FLONASE) 50 MCG/ACT nasal spray Place 2 sprays into both nostrils daily. 16 g 2  . fluticasone (FLOVENT HFA) 110 MCG/ACT inhaler Inhale 2 puffs into the lungs 2 (two) times daily. 1 Inhaler 3  . furosemide (LASIX) 20 MG tablet TAKE 1 TABLET BY MOUTH DAILY 90 tablet 2  . levocetirizine (XYZAL) 5 MG tablet Take 1 tablet (5 mg total) by mouth every evening. 30 tablet 0  . lisinopril (ZESTRIL) 40 MG tablet TAKE 1 TABLET (40 MG TOTAL) BY MOUTH DAILY. 30 tablet 3  . montelukast (SINGULAIR) 10 MG tablet Take 1 tablet (10 mg total) by mouth at bedtime. 30 tablet 3  . rosuvastatin (CRESTOR) 10 MG tablet Take 1 tablet (10 mg total) by mouth daily. 30 tablet 11  . tamsulosin (FLOMAX) 0.4 MG CAPS capsule TAKE ONE CAPSULE BY MOUTH DAILY 90 capsule 1  . VENTOLIN HFA 108 (90 Base) MCG/ACT inhaler INHALE 2 PUFFS BY MOUTH INTO THE LUNGS EVERY 6 (SIX) HOURS AS NEEDED FOR WHEEZING OR SHORTNESS OF BREATH. 18 g 2   No current facility-administered medications on file prior to visit.     BP (!) 147/88   Pulse 64   Temp (!) 96.4 F (35.8 C) (Oral)   Resp 16   Ht 6\' 4"  (1.93 m)   Wt 234 lb 12.8 oz (106.5 kg)   SpO2 100%   BMI 28.58 kg/m       Objective:   Physical Exam  General  Mental Status - Alert. General Appearance - Well groomed. Not in acute distress.  heent- boggy turbinates and nasal congestion.  Skin Rashes- No Rashes.  HEENT Head- Normal. Ear Auditory Canal - Left- Normal.  Right - Normal.Tympanic Membrane- Left- Normal. Right- Normal. Eye Sclera/Conjunctiva- Left- Normal. Right- Normal. Nose & Sinuses Nasal Mucosa- Left-  Boggy and Congested. Right-  Boggy and  Congested.Bilateral maxillary and frontal sinus pressure. Mouth & Throat Lips: Upper Lip- Normal: no dryness, cracking, pallor, cyanosis, or vesicular eruption. Lower Lip-Normal: no dryness, cracking, pallor, cyanosis or vesicular eruption. Buccal Mucosa- Bilateral- No Aphthous ulcers. Oropharynx- No Discharge or Erythema. Tonsils: Characteristics- Bilateral- No Erythema or Congestion. Size/Enlargement- Bilateral- No enlargement. Discharge- bilateral-None.  Neck Neck- Supple. No Masses.   Chest and Lung Exam Auscultation: Breath Sounds:-Clear even and unlabored.  Cardiovascular Auscultation:Rythm- Regular, rate and rhythm. Murmurs & Other Heart Sounds:Ausculatation of the heart reveal- No Murmurs.  Lymphatic Head & Neck General Head & Neck Lymphatics: Bilateral: Description- No Localized lymphadenopathy.   Neurologic Cranial Nerve exam:- CN III-XII intact(No nystagmus), symmetric smile. Strength:- 5/5 equal and symmetric strength both upper and lower extremities.   Lower ext- no pedal edema bilaterally.     Assessment & Plan:  You do have history of hypertension and your blood pressure is a little high today but you did not take blood pressure medication today.  So expect blood pressure would be better/close to or less than 140/90. Please remember to take your blood pressure medication before visit.  For history of hyperlipidemia, will check metabolic panel and CMP today.  Continue Crestor and might make changes to regimen after lab review.  For history of mild elevation in the past will get A1c today.  You have some chronic allergic rhinitis type symptoms throughout the year.  Continue with the Flonase and refilling your Xyzal and montelukast.  In addition making Astelin nasal spray  available to use if needed.  Decided to treat you aggressively today and give Depo-Medrol 40 mg IM injection.  I do  think your symptoms you describe represents allergic rhinitis.  You do not present with other symptoms suspicious for COVID which he did express some concern.  Will assess your response over the next 48 hours to allergy treatment.  If your symptoms change or worsen then we will get you tested through the [place] [facility].  Update me on Thursday afternoon as to how you feel.  You do report chronic history of some dyspnea on exertion over the last year.  You do have history of CHF in the past.  We will get a chest x-ray and BNP today.  Historically you have responded to albuterol and Flovent inhaler.  We will refill those today.  Follow-up date to be determined after lab review and after update regarding response to allergy medication treatment.  40 minutes spent with patient today.  50% of time spent counseling patient on treatment regimen for his conditions and answering her questions.  [name], PA-C

## 2018-08-20 NOTE — Patient Instructions (Addendum)
You do have history of hypertension and your blood pressure is a little high today but you did not take blood pressure medication today.  So expect blood pressure would be better/close to or less than 140/90. Please remember to take your blood pressure medication before visit.  For history of hyperlipidemia, will check metabolic panel and CMP today.  Continue Crestor and might make changes to regimen after lab review.  For history of mild elevation in the past will get A1c today.  You have some chronic allergic rhinitis type symptoms throughout the year.  Continue with the Flonase and refilling your Xyzal and montelukast.  In addition making Astelin nasal spray available to use if needed.  Decided to treat you aggressively today and give Depo-Medrol 40 mg IM injection.  I do think your symptoms you describe represents allergic rhinitis.  You do not present with other symptoms suspicious for COVID which he did express some concern.  Will assess your response over the next 48 hours to allergy treatment.  If your symptoms change or worsen then we will get you tested through the [place] [facility].  Update me on Thursday afternoon as to how you feel.  You do report chronic history of some dyspnea on exertion over the last year.  You do have history of CHF in the past.  We will get a chest x-ray and BNP today.  Historically you have responded to albuterol and Flovent inhaler.  We will refill those today.   Follow-up date to be determined after lab review and after update regarding response to allergy medication treatment.

## 2018-08-20 NOTE — Telephone Encounter (Signed)
Rx metformin sent to pt pharmacy. 

## 2018-08-21 MED FILL — metFORMIN HCL 500 MG TABS: 500 | 30 days supply | Qty: 30 | Fill #0

## 2018-08-26 ENCOUNTER — Telehealth: Payer: Self-pay

## 2018-08-26 ENCOUNTER — Ambulatory Visit (INDEPENDENT_AMBULATORY_CARE_PROVIDER_SITE_OTHER): Payer: Medicare Other

## 2018-08-26 DIAGNOSIS — I5022 Chronic systolic (congestive) heart failure: Secondary | ICD-10-CM

## 2018-08-26 DIAGNOSIS — Z95810 Presence of automatic (implantable) cardiac defibrillator: Secondary | ICD-10-CM

## 2018-08-26 NOTE — Progress Notes (Signed)
EPIC Encounter for ICM Monitoring  Patient Name: Phillip Booth is a 68 y.o. male Date: 08/26/2018 Primary Care Physican: [name], PA-C Primary Cardiologist:Skains Electrophysiologist: Allred Bi-V Pacing: >99% 08/20/2018 Weight: 234 lbs per OV note   Attempted call to patient and unable to reach.  Left message to return call. Transmission reviewed.   Corvue thoracic impedance normal.   Prescribed:Furosemide 20 mg 1 tablet daily  Labs: 08/20/2018 Creatinine 0.92, BUN 10, Potassium 4.3, Sodium 137, GFR 98.39  Recommendations:Unable to reach.    Follow-up plan: ICM clinic phone appointment on10/05/2018.  Copy of ICM check sent to Dr.Allred  3 month ICM trend: 08/26/2018    1 Year ICM trend:       Rosalene Billings, RN 08/26/2018 3:52 PM

## 2018-08-26 NOTE — Progress Notes (Signed)
Patient returned call and stated he is doing well.  He denies any fluid symptoms.  Transmission reviewed.  Next remote transmission 10/14/2018 and encouraged to call if he is experiencing fluid symptoms.

## 2018-08-26 NOTE — Telephone Encounter (Signed)
Remote ICM transmission received.  Attempted call to patient regarding ICM remote transmission and left message, per DPR, to return call.    

## 2018-08-27 ENCOUNTER — Encounter: Payer: Medicare Other | Admitting: *Deleted

## 2018-08-28 ENCOUNTER — Telehealth: Payer: Self-pay

## 2018-08-28 ENCOUNTER — Telehealth: Payer: Self-pay | Admitting: *Deleted

## 2018-08-28 DIAGNOSIS — I5022 Chronic systolic (congestive) heart failure: Secondary | ICD-10-CM

## 2018-08-28 DIAGNOSIS — I428 Other cardiomyopathies: Secondary | ICD-10-CM

## 2018-08-28 NOTE — Telephone Encounter (Signed)
From: Megan [email or web address]> Sent: Wednesday, August 28, 2018 2:01 PM To: [email or web address] Subject: Appointment Request   *Caution - External email - see footer for warnings*  Good Afternoon Pamala Hurry,  In speaking with Gar Glance (DOB - Mar 27, 1950), Henrene Pastor would like to have an appointment set up to see Dr Rayann Heman.  Balen mentioned that sometimes he struggles to breath and when he exercises he feels like it stresses his chest and would like to speak with Allred about this concern.  Phone Number is [phone]  Patient did state that where he is sometimes he gets a bad reception on his phone but can leave a detailed message with the appointment information.  Thank you in advance Megan Calloway [employer]  Received the above via email r/t pt's complaints. Pt last saw Dr Marlou Porch 01/10/2018. At that time he was c/o occasional chest 'fullness" and was started on Imdur 30 mg daily.  Last cardiac cath was 03/19/2014 demonstrating Coronary obstructive disease with 30-40% first diagonal stenosis, 20% mid LAD stenosis, 40% second diagonal stenosis of the LAD; 20% mid circumflex stenosis with distal. Branch stenoses of 90 and 95% in very distal small vessels; and 20% mid RCA stenosis with 70% ostial stenosis and RV marginal branch Last Nuclear stress test and echo was as follows: Nuclear stress test 02/22/2017 -Overall low risk with no evidence of ischemia.  EF on echocardiogram 05/01/2017 was normal.  LVH noted.  He was instructed to f/u in 1 year.   Pt recently saw [name], PA for c/o SOB and fullness and treated for chronic allergic rhinitis type symptoms.  He is using Astelin, Xyzal and montelukast.  Pt also received a Depo-Medrol IM injection that day.  Pt os c/o breathing being pretty short here lately esp. With walking up the stairs to his apartment.  He feels he runs out of energy quickly and states he has  recently gained wt because he has been staying inside alot more lately.This has been ongoing to about 4 months and off/on throughout the year at different times. It get better with rest. I advised pt to follow-up with Mr Harvie Heck, [place] since he is not feeling much better.  In speaking with pt it does not sound at if he has been taking his Isosorbide and in fact it is off of his medication list.  He reports it gave him a headache. Advised pt how this medication works and the reason he was started on it.  He states understanding. Aware I will forward this information to Dr Marlou Porch for his knowledge and any orders.  Pt was grateful for the call back, time taken with him and information given.

## 2018-08-28 NOTE — Telephone Encounter (Signed)
Spoke with patient to remind of missed remote transmission 

## 2018-08-29 NOTE — Telephone Encounter (Signed)
Pt advised and will let us know next week how he is feeling.

## 2018-08-29 NOTE — Telephone Encounter (Signed)
Continue with the lasix, in fact let's try 40mg  for 3 days then back to 20. Also, let's check an ECHO (been over a year) to make sure pump function is OK.   Candee Furbish, MD

## 2018-08-30 ENCOUNTER — Other Ambulatory Visit: Payer: Self-pay | Admitting: Medical

## 2018-08-30 MED FILL — TAMSULOSIN HCL 0.4 MG CAP: 0.4 | 90 days supply | Qty: 90 | Fill #0

## 2018-08-30 MED FILL — FUROSEMIDE 20 MG TAB: 20 | 90 days supply | Qty: 90 | Fill #1

## 2018-08-30 NOTE — Telephone Encounter (Signed)
° °  Patient has follow up questions regarding medications

## 2018-08-30 NOTE — Telephone Encounter (Signed)
Pt forgot which medication he was supposed to be taking twice a day. After reviewing med list and discussing Dr. Marlou Porch recommendation regarding taking an extra 20mg  Lasix for 3 days. Pt agreeable and will call back on Monday with an update.

## 2018-09-02 ENCOUNTER — Other Ambulatory Visit (HOSPITAL_COMMUNITY): Payer: Medicare Other

## 2018-09-09 ENCOUNTER — Other Ambulatory Visit (HOSPITAL_COMMUNITY): Payer: Medicare Other

## 2018-09-17 MED FILL — CARVEDILOL 6.25 MG TABLET: 6.25 | 30 days supply | Qty: 60 | Fill #1

## 2018-09-17 MED FILL — LISINOPRIL 40 MG TABS: 40 | 30 days supply | Qty: 30 | Fill #2

## 2018-09-17 MED FILL — ISOSORBIDE MN ER 30 MG TAB: 30 | 90 days supply | Qty: 90 | Fill #1

## 2018-09-19 ENCOUNTER — Ambulatory Visit (INDEPENDENT_AMBULATORY_CARE_PROVIDER_SITE_OTHER): Payer: Medicare Other | Admitting: Student

## 2018-09-19 ENCOUNTER — Other Ambulatory Visit: Payer: Self-pay

## 2018-09-19 ENCOUNTER — Encounter: Payer: Self-pay | Admitting: Student

## 2018-09-19 VITALS — BP 150/84 | HR 67 | Ht 76.0 in | Wt 235.0 lb

## 2018-09-19 DIAGNOSIS — I5022 Chronic systolic (congestive) heart failure: Secondary | ICD-10-CM | POA: Diagnosis not present

## 2018-09-19 LAB — CUP PACEART INCLINIC DEVICE CHECK
Battery Remaining Longevity: 69 mo
Battery Voltage: 2.95 V
Brady Statistic RA Percent Paced: 97 %
Brady Statistic RV Percent Paced: 99.97 %
Date Time Interrogation Session: 20200910115624
Implantable Lead Implant Date: 20160310
Implantable Lead Implant Date: 20160310
Implantable Lead Implant Date: 20160310
Implantable Lead Location: 753858
Implantable Lead Location: 753859
Implantable Lead Location: 753860
Implantable Lead Model: 1948
Implantable Pulse Generator Implant Date: 20160310
Lead Channel Impedance Value: 425 Ohm
Lead Channel Impedance Value: 562.5 Ohm
Lead Channel Impedance Value: 700 Ohm
Lead Channel Pacing Threshold Amplitude: 0.75 V
Lead Channel Pacing Threshold Amplitude: 0.75 V
Lead Channel Pacing Threshold Amplitude: 0.75 V
Lead Channel Pacing Threshold Amplitude: 0.75 V
Lead Channel Pacing Threshold Amplitude: 0.75 V
Lead Channel Pacing Threshold Amplitude: 0.75 V
Lead Channel Pacing Threshold Pulse Width: 0.5 ms
Lead Channel Pacing Threshold Pulse Width: 0.5 ms
Lead Channel Pacing Threshold Pulse Width: 0.5 ms
Lead Channel Pacing Threshold Pulse Width: 0.5 ms
Lead Channel Pacing Threshold Pulse Width: 0.5 ms
Lead Channel Pacing Threshold Pulse Width: 0.5 ms
Lead Channel Sensing Intrinsic Amplitude: 12 mV
Lead Channel Sensing Intrinsic Amplitude: 3.1 mV
Lead Channel Setting Pacing Amplitude: 2 V
Lead Channel Setting Pacing Amplitude: 2.5 V
Lead Channel Setting Pacing Amplitude: 2.5 V
Lead Channel Setting Pacing Pulse Width: 0.5 ms
Lead Channel Setting Pacing Pulse Width: 0.5 ms
Lead Channel Setting Sensing Sensitivity: 2 mV
Pulse Gen Model: 3242
Pulse Gen Serial Number: 7724805

## 2018-09-19 NOTE — Patient Instructions (Signed)
Medication Instructions:  Your physician recommends that you continue on your current medications as directed. Please refer to the Current Medication list given to you today.  If you need a refill on your cardiac medications before your next appointment, please call your pharmacy.   Lab work: NONE ORDERED  TODAY   If you have labs (blood work) drawn today and your tests are completely normal, you will receive your results only by: Marland Kitchen MyChart Message (if you have MyChart) OR . A paper copy in the mail If you have any lab test that is abnormal or we need to change your treatment, we will call you to review the results.  Testing/Procedures: NONE ORDERED  TODAY    Follow-Up: At [facility], you and your health needs are our priority.  As part of our continuing mission to provide you with exceptional heart care, we have created designated Provider Care Teams.  These Care Teams include your primary Cardiologist (physician) and Advanced Practice Providers (APPs -  Physician Assistants and Nurse Practitioners) who all work together to provide you with the care you need, when you need it. You will need a follow up appointment in 1 years.  Please call our office 2 months in advance to schedule this appointment.  You may see Dr. Rayann Heman  or one of the following Advanced Practice Providers on your designated Care Team:   Chanetta Marshall, NP . Tommye Standard, PA-C . Joesph July PA-C   Any Other Special Instructions Will Be Listed Below (If Applicable).

## 2018-09-19 NOTE — Progress Notes (Addendum)
Electrophysiology Office Note Date: 09/19/2018  ID:  Phillip Booth, DOB 07/23/1950, MRN [id number]  PCP: Elise Benne Primary Cardiologist: No primary care provider on file. Electrophysiologist: None  CC: Pacemaker follow-up  Phillip Booth is a 68 y.o. male seen today for Dr. Rayann Booth. He presents today for routine electrophysiology followup.  Since last being seen in our clinic, the patient reports doing very well.  He denies chest pain, palpitations, dyspnea, PND, orthopnea, nausea, vomiting, dizziness, syncope, edema, weight gain, or early satiety.  His BP is elevated upon arrival, but he forgot to take his medications this am.   Device History: St. Jude BiV PPM implanted 03/19/2014 for symptomatic bradycardia  Past Medical History:  Diagnosis Date  . Allergy   . CAD (coronary artery disease)    a. moderate by cath 03/2014  . Cataract   . CVA (cerebral infarction)    a. diagnosis not clear  . Hyperlipidemia   . Hypertension   . Non-ischemic cardiomyopathy ([place])    a. EF 45% by echo 03/2014  . Paroxysmal atrial fibrillation ([place]) 05/01/2014   asymptomatic, documented on PPM interrogation,  chads2vasc score is at least 6.  He is contemplating anticoagulation  . Symptomatic bradycardia    a. s/p STJ CRTP implanted by Dr Phillip Booth  . Vertigo    Past Surgical History:  Procedure Laterality Date  . ABDOMINAL SURGERY    . APPENDECTOMY    . BI-VENTRICULAR PACEMAKER INSERTION N/A 03/19/2014   STJ CRTP implanted by Dr Phillip Booth  . EYE SURGERY     Cataract removed from Right eye  . HERNIA REPAIR    . LEFT HEART CATHETERIZATION WITH CORONARY ANGIOGRAM N/A 03/19/2014   Procedure: LEFT HEART CATHETERIZATION WITH CORONARY ANGIOGRAM;  Surgeon: [name], MD;  Location: [facility] CATH LAB;  Service: Cardiovascular;  Laterality: N/A;    Current Outpatient Medications  Medication Sig Dispense Refill  . albuterol (VENTOLIN HFA) 108 (90 Base) MCG/ACT inhaler INHALE 2 PUFFS INTO THE LUNGS  EVERY 6 (SIX) HOURS AS NEEDED FOR WHEEZING OR SHORTNESS OF BREATH. 18 g 2  . amLODipine (NORVASC) 10 MG tablet TAKE 1 TABLET (10 MG TOTAL) BY MOUTH DAILY. 90 tablet 1  . aspirin 81 MG tablet Take 81 mg by mouth daily.     Marland Kitchen azelastine (ASTELIN) 0.1 % nasal spray Place 2 sprays into both nostrils 2 (two) times daily. Use in each nostril as directed 30 mL 1  . carvedilol (COREG) 6.25 MG tablet TAKE 1 TABLET BY MOUTH TWICE DAILY WITH A MEAL 60 tablet 3  . Cetirizine HCl (ZYRTEC ALLERGY) 10 MG CAPS Take 1 capsule (10 mg total) by mouth daily. 30 capsule 0  . fluticasone (FLONASE) 50 MCG/ACT nasal spray Place 2 sprays into both nostrils daily. 16 g 2  . fluticasone (FLOVENT HFA) 110 MCG/ACT inhaler Inhale 2 puffs into the lungs 2 (two) times daily. 1 Inhaler 3  . furosemide (LASIX) 20 MG tablet TAKE 1 TABLET BY MOUTH DAILY 90 tablet 2  . isosorbide mononitrate (IMDUR) 30 MG 24 hr tablet Take 30 mg by mouth daily.    Marland Kitchen levocetirizine (XYZAL) 5 MG tablet Take 1 tablet (5 mg total) by mouth every evening. 30 tablet 3  . lisinopril (ZESTRIL) 40 MG tablet TAKE 1 TABLET (40 MG TOTAL) BY MOUTH DAILY. 30 tablet 3  . metFORMIN (GLUCOPHAGE) 500 MG tablet 1 tab po q day 30 tablet 3  . montelukast (SINGULAIR) 10 MG tablet Take 1 tablet (10 mg total)  by mouth at bedtime. 30 tablet 3  . rosuvastatin (CRESTOR) 10 MG tablet Take 1 tablet (10 mg total) by mouth daily. 30 tablet 11  . tamsulosin (FLOMAX) 0.4 MG CAPS capsule TAKE ONE CAPSULE BY MOUTH DAILY 90 capsule 1  . VENTOLIN HFA 108 (90 Base) MCG/ACT inhaler INHALE 2 PUFFS BY MOUTH INTO THE LUNGS EVERY 6 (SIX) HOURS AS NEEDED FOR WHEEZING OR SHORTNESS OF BREATH. 18 g 2   No current facility-administered medications for this visit.     Allergies:   Lactose intolerance (gi)   Social History: Social History   Socioeconomic History  . Marital status: Married    Spouse name: Not on file  . Number of children: Not on file  . Years of education: Not on file  .  Highest education level: Not on file  Occupational History  . Not on file  Social Needs  . Financial resource strain: Not on file  . Food insecurity    Worry: Not on file    Inability: Not on file  . Transportation needs    Medical: Not on file    Non-medical: Not on file  Tobacco Use  . Smoking status: Never Smoker  . Smokeless tobacco: Never Used  Substance and Sexual Activity  . Alcohol use: Yes    Comment: occasional  . Drug use: No  . Sexual activity: Yes  Lifestyle  . Physical activity    Days per week: Not on file    Minutes per session: Not on file  . Stress: Not on file  Relationships  . Social Herbalist on phone: Not on file    Gets together: Not on file    Attends religious service: Not on file    Active member of club or organization: Not on file    Attends meetings of clubs or organizations: Not on file    Relationship status: Not on file  . Intimate partner violence    Fear of current or ex partner: Not on file    Emotionally abused: Not on file    Physically abused: Not on file    Forced sexual activity: Not on file  Other Topics Concern  . Not on file  Social History Narrative  . Not on file    Family History: Family History  Problem Relation Age of Onset  . Hypertension Mother   . Cancer Mother        brain (possibly started in lung)  . Heart disease Mother   . Diabetes Mother   . Other Mother        cardiomegaly  . Hypertension Father   . Emphysema Father   . Heart Problems Brother        pacemaker  . Heart Problems Other        "using 10% of heart"     Review of Systems: All other systems reviewed and are otherwise negative except as noted above.  Physical Exam: Vitals:   09/19/18 1026 09/19/18 1046  BP: (!) 198/102 (!) 150/84  Pulse: 67   Weight: 235 lb (106.6 kg)   Height: 6\' 4"  (1.93 m)      GEN- The patient is well appearing, alert and oriented x 3 today.   HEENT: normocephalic, atraumatic; sclera clear,  conjunctiva pink; hearing intact; oropharynx clear; neck supple  Lungs- Clear to ausculation bilaterally, normal work of breathing.  No wheezes, rales, rhonchi Heart- Regular rate and rhythm, no murmurs, rubs or gallops  GI- soft,  non-tender, non-distended, bowel sounds present  Extremities- no clubbing, cyanosis, or edema  MS- no significant deformity or atrophy Skin- warm and dry, no rash or lesion; PPM pocket well healed Psych- euthymic mood, full affect Neuro- strength and sensation are intact  PPM Interrogation- reviewed in detail today,  See PACEART report  EKG:  EKG is ordered today. Personal reviewed shows A-V dual paced rhythm at 67 bpm  Recent Labs: 08/20/2018: ALT 20; BUN 10; Creatinine, Ser 0.92; Potassium 4.3; Pro B Natriuretic peptide (BNP) 164.0; Sodium 137   Wt Readings from Last 3 Encounters:  09/19/18 235 lb (106.6 kg)  08/20/18 234 lb 12.8 oz (106.5 kg)  01/10/18 227 lb (103 kg)     Assessment and Plan:  1.  Symptomatic second degree ([place] II) HB s/p St. Jude PPM  Normal PPM function See Pace Art report No changes today  2. NICM CM Echo 04/2017 with continued normal EF.  Has repeat pending tomorrow.   3. At[place] No afib episodes No indication for [place] at this time  4. HTN Elevated on arrival with improvement after taking his medications. May need to be adjusted further pending Echo tomorrow.   5. Chest tightness No further. Normal myoview 02/2017. EF read as 49% but normal on correlating Echo.   Current medicines are reviewed at length with the patient today.   The patient does not have concerns regarding his medicines.  The following changes were made today:  none  Labs/ tests ordered today include:  Orders Placed This Encounter  Procedures  . EKG 12-Lead     Disposition:   Follow up with EP APP in 12 months. Continue remotes q 3 months.   Jacalyn Lefevre, PA-C  09/19/2018 11:34 AM  [facility] [address]  [place] [address] [phone] (office) [phone] (fax)

## 2018-09-20 ENCOUNTER — Ambulatory Visit (HOSPITAL_COMMUNITY): Payer: Medicare Other | Attending: Cardiology

## 2018-09-20 DIAGNOSIS — I5022 Chronic systolic (congestive) heart failure: Secondary | ICD-10-CM

## 2018-09-20 DIAGNOSIS — I428 Other cardiomyopathies: Secondary | ICD-10-CM

## 2018-09-23 ENCOUNTER — Ambulatory Visit (INDEPENDENT_AMBULATORY_CARE_PROVIDER_SITE_OTHER): Payer: Medicare Other | Admitting: *Deleted

## 2018-09-23 DIAGNOSIS — I441 Atrioventricular block, second degree: Secondary | ICD-10-CM

## 2018-09-23 DIAGNOSIS — I480 Paroxysmal atrial fibrillation: Secondary | ICD-10-CM | POA: Diagnosis not present

## 2018-09-23 LAB — CUP PACEART REMOTE DEVICE CHECK
Battery Remaining Longevity: 75 mo
Battery Remaining Percentage: 89 %
Battery Voltage: 2.95 V
Brady Statistic AP VP Percent: 99 %
Brady Statistic AP VS Percent: 1 %
Brady Statistic AS VP Percent: 1 %
Brady Statistic AS VS Percent: 0 %
Brady Statistic RA Percent Paced: 99 %
Date Time Interrogation Session: 20200914060022
Implantable Lead Implant Date: 20160310
Implantable Lead Implant Date: 20160310
Implantable Lead Implant Date: 20160310
Implantable Lead Location: 753858
Implantable Lead Location: 753859
Implantable Lead Location: 753860
Implantable Lead Model: 1948
Implantable Pulse Generator Implant Date: 20160310
Lead Channel Impedance Value: 410 Ohm
Lead Channel Impedance Value: 560 Ohm
Lead Channel Impedance Value: 700 Ohm
Lead Channel Pacing Threshold Amplitude: 0.75 V
Lead Channel Pacing Threshold Amplitude: 0.75 V
Lead Channel Pacing Threshold Amplitude: 0.75 V
Lead Channel Pacing Threshold Pulse Width: 0.5 ms
Lead Channel Pacing Threshold Pulse Width: 0.5 ms
Lead Channel Pacing Threshold Pulse Width: 0.5 ms
Lead Channel Sensing Intrinsic Amplitude: 12 mV
Lead Channel Sensing Intrinsic Amplitude: 2.7 mV
Lead Channel Setting Pacing Amplitude: 2 V
Lead Channel Setting Pacing Amplitude: 2.5 V
Lead Channel Setting Pacing Amplitude: 2.5 V
Lead Channel Setting Pacing Pulse Width: 0.5 ms
Lead Channel Setting Pacing Pulse Width: 0.5 ms
Lead Channel Setting Sensing Sensitivity: 2 mV
Pulse Gen Model: 3242
Pulse Gen Serial Number: 7724805

## 2018-10-04 NOTE — Progress Notes (Signed)
Remote pacemaker transmission.   

## 2018-10-22 ENCOUNTER — Ambulatory Visit (INDEPENDENT_AMBULATORY_CARE_PROVIDER_SITE_OTHER): Payer: Medicare Other

## 2018-10-22 DIAGNOSIS — Z95810 Presence of automatic (implantable) cardiac defibrillator: Secondary | ICD-10-CM

## 2018-10-22 DIAGNOSIS — I5022 Chronic systolic (congestive) heart failure: Secondary | ICD-10-CM

## 2018-10-22 MED FILL — LISINOPRIL 40 MG TABLET: 40 | 30 days supply | Qty: 30 | Fill #3

## 2018-10-22 MED FILL — MONTELUKAST SOD 10 MG TAB: 10 | 30 days supply | Qty: 30 | Fill #1

## 2018-10-22 MED FILL — LEVOCETIRIZINE 5 MG TABLET: 5 | 30 days supply | Qty: 30 | Fill #1

## 2018-10-22 MED FILL — CARVEDILOL 6.25 MG TABLET: 6.25 | 30 days supply | Qty: 60 | Fill #2

## 2018-10-22 MED FILL — metFORMIN HCL 500 MG TABS: 500 | 30 days supply | Qty: 30 | Fill #1

## 2018-10-25 NOTE — Progress Notes (Signed)
EPIC Encounter for ICM Monitoring  Patient Name: Phillip Booth is a 68 y.o. male Date: 10/25/2018 Primary Care Physican: [name], PA-C Primary Cardiologist:Skains Electrophysiologist: Allred Bi-V Pacing: >99% 09/19/2018 Weight: 235 lbs per OV note   Spoke with patient.  He denies any fluid symptoms and feeling good.  Corvue thoracic impedance normal.   Prescribed:Furosemide 20 mg 1 tablet daily  Labs: 08/20/2018 Creatinine 0.92, BUN 10, Potassium 4.3, Sodium 137, GFR 98.39  Recommendations: No changes and encouraged to call if experiencing any fluid symptoms.  Follow-up plan: ICM clinic phone appointment on 11/25/2018.   91 day device clinic remote transmission 12/23/2018.    Copy of ICM check sent to Dr. Rayann Heman.   3 month ICM trend: 10/22/2018    1 Year ICM trend:       Rosalene Billings, RN 10/25/2018 11:13 AM

## 2018-11-25 ENCOUNTER — Ambulatory Visit (INDEPENDENT_AMBULATORY_CARE_PROVIDER_SITE_OTHER): Payer: Medicare Other

## 2018-11-25 DIAGNOSIS — I5022 Chronic systolic (congestive) heart failure: Secondary | ICD-10-CM | POA: Diagnosis not present

## 2018-11-25 DIAGNOSIS — Z95810 Presence of automatic (implantable) cardiac defibrillator: Secondary | ICD-10-CM | POA: Diagnosis not present

## 2018-11-26 ENCOUNTER — Telehealth: Payer: Self-pay

## 2018-11-26 NOTE — Telephone Encounter (Signed)
Remote ICM transmission received.  Attempted call to patient regarding ICM remote transmission and left detailed message per DPR.  Advised to return call for any fluid symptoms or questions. Next ICM remote transmission scheduled 12/26/2018.

## 2018-11-26 NOTE — Progress Notes (Signed)
EPIC Encounter for ICM Monitoring  Patient Name: Phillip Booth is a 68 y.o. male Date: 11/26/2018 Primary Care Physican: [name], PA-C Primary Cardiologist:Skains Electrophysiologist: Allred Bi-V Pacing: >99% 09/19/2018 Weight: 235 lbs per OV note   Attempted call to patient and unable to reach.  Left detailed message per DPR regarding transmission. Transmission reviewed.   Corvue thoracic impedance normal.   Prescribed:Furosemide 20 mg 1 tablet daily  Labs: 08/20/2018 Creatinine 0.92, BUN 10, Potassium 4.3, Sodium 137, GFR 98.39  Recommendations: Left voice mail with ICM number and encouraged to call if experiencing any fluid symptoms.  Follow-up plan: ICM clinic phone appointment on 12/26/2018 to recheck fluid levels.   91 day device clinic remote transmission 12/23/2018.   Copy of ICM check sent to Dr. Rayann Heman.   3 month ICM trend: 11/25/2018    1 Year ICM trend:       Rosalene Billings, RN 11/26/2018 4:37 PM

## 2018-11-27 ENCOUNTER — Other Ambulatory Visit: Payer: Self-pay

## 2018-11-27 IMAGING — NM NM MISC PROCEDURE
6 series · 36 of 36 positions shown · non-contrast
Comparison: none

[Series 1: stress-gsp · 6.40mm/px · 6 of 512 frames shown]
[frame 43/512]
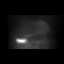
[frame 128/512]
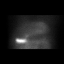
[frame 214/512]
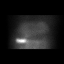
[frame 299/512]
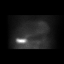
[frame 384/512]
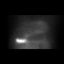
[frame 470/512]
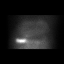

[Series 1: rest · 6.40mm/px · 6 of 64 frames shown]
[frame 6/64]
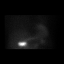
[frame 16/64]
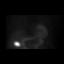
[frame 27/64]
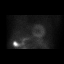
[frame 38/64]
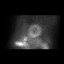
[frame 48/64]
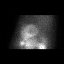
[frame 59/64]
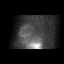

[Series 1: wbr_r-proj_st rest · 6.40mm/px · 6 of 64 frames shown]
[frame 6/64]
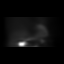
[frame 16/64]
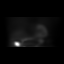
[frame 27/64]
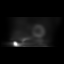
[frame 38/64]
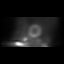
[frame 48/64]
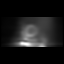
[frame 59/64]
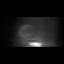

[Series 1: wbr_s-proj_st stress-sum-em · 6.40mm/px · 6 of 64 frames shown]
[frame 6/64]
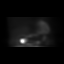
[frame 16/64]
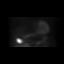
[frame 27/64]
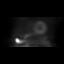
[frame 38/64]
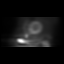
[frame 48/64]
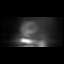
[frame 59/64]
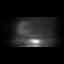

[Series 1: wbr_s-proj_st stress-gsp · 6.40mm/px · 6 of 512 frames shown]
[frame 43/512]
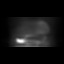
[frame 128/512]
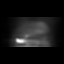
[frame 214/512]
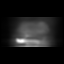
[frame 299/512]
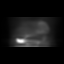
[frame 384/512]
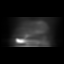
[frame 470/512]
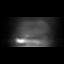

[Series 1: stress-sum-em · 6.40mm/px · 6 of 64 frames shown]
[frame 6/64]
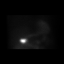
[frame 16/64]
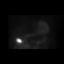
[frame 27/64]
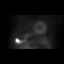
[frame 38/64]
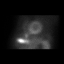
[frame 48/64]
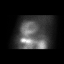
[frame 59/64]
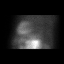

[36 of 36 positions shown; findings below may reference images not displayed]

Canned report from images found in remote index.

Refer to host system for actual result text.

## 2018-11-28 ENCOUNTER — Ambulatory Visit (INDEPENDENT_AMBULATORY_CARE_PROVIDER_SITE_OTHER): Payer: Medicare Other | Admitting: Medical

## 2018-11-28 ENCOUNTER — Encounter: Payer: Self-pay | Admitting: Medical

## 2018-11-28 ENCOUNTER — Other Ambulatory Visit: Payer: Self-pay | Admitting: Medical

## 2018-11-28 VITALS — BP 150/88 | HR 68 | Temp 96.0°F | Resp 16 | Ht 76.0 in | Wt 235.2 lb

## 2018-11-28 DIAGNOSIS — I10 Essential (primary) hypertension: Secondary | ICD-10-CM

## 2018-11-28 DIAGNOSIS — Z8679 Personal history of other diseases of the circulatory system: Secondary | ICD-10-CM

## 2018-11-28 DIAGNOSIS — R739 Hyperglycemia, unspecified: Secondary | ICD-10-CM

## 2018-11-28 DIAGNOSIS — Z23 Encounter for immunization: Secondary | ICD-10-CM | POA: Diagnosis not present

## 2018-11-28 DIAGNOSIS — E785 Hyperlipidemia, unspecified: Secondary | ICD-10-CM

## 2018-11-28 MED FILL — TAMSULOSIN HCL 0.4 MG CAP: 0.4 | 90 days supply | Qty: 90 | Fill #1

## 2018-11-28 MED FILL — metFORMIN HCL 500 MG TABS: 500 | 30 days supply | Qty: 30 | Fill #2

## 2018-11-28 MED FILL — CARVEDILOL 6.25 MG TABLET: 6.25 | 30 days supply | Qty: 60 | Fill #3

## 2018-11-28 MED FILL — MONTELUKAST SOD 10 MG TAB: 10 | 30 days supply | Qty: 30 | Fill #2

## 2018-11-28 MED FILL — LISINOPRIL 40 MG TABLET: 40 | 30 days supply | Qty: 30 | Fill #0

## 2018-11-28 MED FILL — LEVOCETIRIZINE 5 MG TABLET: 5 | 30 days supply | Qty: 30 | Fill #2

## 2018-11-28 MED FILL — FUROSEMIDE 20 MG TAB: 20 | 90 days supply | Qty: 90 | Fill #2

## 2018-11-28 NOTE — Progress Notes (Signed)
Subjective:    Patient ID: Phillip Booth, male    DOB: [birth year], 68 y.o.   MRN: [id number]  HPI  Pt in for follow up.  Pt did not take his bp yet. He did eat [employer] today. Usually he states his bp is well controlled. His bp usually is high 130- low 140 level.   Pt has high cholesterol. He is on crestor.  Pt has chroinic chf.  Pt wants flu vaccine today.  Pt has no cardiac or neurologic signs or symptoms.   Review of Systems  Constitutional: Negative for chills, fatigue and fever.  HENT: Negative for congestion, ear discharge and ear pain.   Respiratory: Negative for chest tightness, shortness of breath and wheezing.   Cardiovascular: Negative for chest pain and palpitations.  Gastrointestinal: Negative for abdominal pain, constipation, nausea and vomiting.  Genitourinary: Negative for dysuria and frequency.  Musculoskeletal: Negative for back pain and myalgias.  Skin: Negative for rash.  Neurological: Negative for dizziness and light-headedness.  Hematological: Negative for adenopathy.  Psychiatric/Behavioral: Negative for behavioral problems, hallucinations and sleep disturbance. The patient is not nervous/anxious.     Past Medical History:  Diagnosis Date  . Allergy   . CAD (coronary artery disease)    a. moderate by cath 03/2014  . Cataract   . CVA (cerebral infarction)    a. diagnosis not clear  . Hyperlipidemia   . Hypertension   . Non-ischemic cardiomyopathy ([place])    a. EF 45% by echo 03/2014  . Paroxysmal atrial fibrillation ([place]) 05/01/2014   asymptomatic, documented on PPM interrogation,  chads2vasc score is at least 6.  He is contemplating anticoagulation  . Symptomatic bradycardia    a. s/p STJ CRTP implanted by Dr Rayann Heman  . Vertigo      Social History   Socioeconomic History  . Marital status: Married    Spouse name: Not on file  . Number of children: Not on file  . Years of education: Not on file  . Highest education level: Not on file   Occupational History  . Not on file  Social Needs  . Financial resource strain: Not on file  . Food insecurity    Worry: Not on file    Inability: Not on file  . Transportation needs    Medical: Not on file    Non-medical: Not on file  Tobacco Use  . Smoking status: Never Smoker  . Smokeless tobacco: Never Used  Substance and Sexual Activity  . Alcohol use: Yes    Comment: occasional  . Drug use: No  . Sexual activity: Yes  Lifestyle  . Physical activity    Days per week: Not on file    Minutes per session: Not on file  . Stress: Not on file  Relationships  . Social Herbalist on phone: Not on file    Gets together: Not on file    Attends religious service: Not on file    Active member of club or organization: Not on file    Attends meetings of clubs or organizations: Not on file    Relationship status: Not on file  . Intimate partner violence    Fear of current or ex partner: Not on file    Emotionally abused: Not on file    Physically abused: Not on file    Forced sexual activity: Not on file  Other Topics Concern  . Not on file  Social History Narrative  . Not on file  Past Surgical History:  Procedure Laterality Date  . ABDOMINAL SURGERY    . APPENDECTOMY    . BI-VENTRICULAR PACEMAKER INSERTION N/A 03/19/2014   STJ CRTP implanted by Dr Rayann Heman  . EYE SURGERY     Cataract removed from Right eye  . HERNIA REPAIR    . LEFT HEART CATHETERIZATION WITH CORONARY ANGIOGRAM N/A 03/19/2014   Procedure: LEFT HEART CATHETERIZATION WITH CORONARY ANGIOGRAM;  Surgeon: [name], MD;  [facility] C[facility];  Service: Cardiovascular;  Laterality: N/A;    Family History  Problem Relation Age of Onset  . Hypertension Mother   . Cancer Mother        brain (possibly started in lung)  . Heart disease Mother   . Diabetes Mother   . Other Mother        cardiomegaly  . Hypertension Father   . Emphysema Father   . Heart Problems Brother        pacemaker  .  Heart Problems Other        "using 10% of heart"    Allergies  Allergen Reactions  . Lactose Intolerance (Gi) Hives and Nausea And Vomiting    Current Outpatient Medications on File Prior to Visit  Medication Sig Dispense Refill  . albuterol (VENTOLIN HFA) 108 (90 Base) MCG/ACT inhaler INHALE 2 PUFFS INTO THE LUNGS EVERY 6 (SIX) HOURS AS NEEDED FOR WHEEZING OR SHORTNESS OF BREATH. 18 g 2  . amLODipine (NORVASC) 10 MG tablet TAKE 1 TABLET (10 MG TOTAL) BY MOUTH DAILY. 90 tablet 1  . aspirin 81 MG tablet Take 81 mg by mouth daily.     Marland Kitchen azelastine (ASTELIN) 0.1 % nasal spray Place 2 sprays into both nostrils 2 (two) times daily. Use in each nostril as directed 30 mL 1  . carvedilol (COREG) 6.25 MG tablet TAKE 1 TABLET BY MOUTH TWICE DAILY WITH A MEAL 60 tablet 3  . Cetirizine HCl (ZYRTEC ALLERGY) 10 MG CAPS Take 1 capsule (10 mg total) by mouth daily. 30 capsule 0  . fluticasone (FLONASE) 50 MCG/ACT nasal spray Place 2 sprays into both nostrils daily. 16 g 2  . fluticasone (FLOVENT HFA) 110 MCG/ACT inhaler Inhale 2 puffs into the lungs 2 (two) times daily. 1 Inhaler 3  . furosemide (LASIX) 20 MG tablet TAKE 1 TABLET BY MOUTH DAILY 90 tablet 2  . isosorbide mononitrate (IMDUR) 30 MG 24 hr tablet Take 30 mg by mouth daily.    Marland Kitchen levocetirizine (XYZAL) 5 MG tablet Take 1 tablet (5 mg total) by mouth every evening. 30 tablet 3  . lisinopril (ZESTRIL) 40 MG tablet TAKE 1 TABLET (40 MG TOTAL) BY MOUTH DAILY. 30 tablet 3  . metFORMIN (GLUCOPHAGE) 500 MG tablet 1 tab po q day 30 tablet 3  . montelukast (SINGULAIR) 10 MG tablet Take 1 tablet (10 mg total) by mouth at bedtime. 30 tablet 3  . rosuvastatin (CRESTOR) 10 MG tablet Take 1 tablet (10 mg total) by mouth daily. 30 tablet 11  . tamsulosin (FLOMAX) 0.4 MG CAPS capsule TAKE ONE CAPSULE BY MOUTH DAILY 90 capsule 1  . VENTOLIN HFA 108 (90 Base) MCG/ACT inhaler INHALE 2 PUFFS BY MOUTH INTO THE LUNGS EVERY 6 (SIX) HOURS AS NEEDED FOR WHEEZING OR  SHORTNESS OF BREATH. 18 g 2   No current facility-administered medications on file prior to visit.     BP (!) 170/92   Pulse 68   Temp (!) 96 F (35.6 C) (Temporal)   Resp 16  Ht 6\' 4"  (1.93 m)   Wt 235 lb 3.2 oz (106.7 kg)   SpO2 100%   BMI 28.63 kg/m       Objective:   Physical Exam  General Mental Status- Alert. General Appearance- Not in acute distress.   Skin General: Color- Normal Color. Moisture- Normal Moisture.  Neck Carotid Arteries- Normal color. Moisture- Normal Moisture. No carotid bruits. No JVD.  Chest and Lung Exam Auscultation: Breath Sounds:-Normal.  Cardiovascular Auscultation:Rythm- Regular. Murmurs & Other Heart Sounds:Auscultation of the heart reveals- No Murmurs.  Abdomen Inspection:-Inspeection Normal. Palpation/Percussion:Note:No mass. Palpation and Percussion of the abdomen reveal- Non Tender, Non Distended + BS, no rebound or guarding.   Neurologic Cranial Nerve exam:- CN III-XII intact(No nystagmus), symmetric smile. Strength:- 5/5 equal and symmetric strength both upper and lower extremities.      Assessment & Plan:  For htn, I want you to take your blood pressure medicine today/now. Continue current med regimen. Will get cmp today.  For chf, continue to follow up with your cardiologist.  For elevated sugar and borderline a1c continue metformin.  Flu vaccine today.  Follow up date to be determined. Sharren Bridge will be 3 months but sooner if needed.  Lyndee Herbst, PA-C   25 minutes spent with pt. 50% of time spent counseling pt on plan going forward.

## 2018-11-28 NOTE — Patient Instructions (Addendum)
For htn, I want you to take your blood pressure medicine today/now. Continue current med regimen. Will get cmp today.  For chf, continue to follow up with your cardiologist.  For elevated sugar and borderline a1c continue metformin.  Flu vaccine today.  Follow up date to be determined. Sharren Bridge will be 3 months but sooner if needed.

## 2018-11-28 NOTE — Addendum Note (Signed)
Addended by: Hinton Dyer on: 11/28/2018 11:13 AM   Modules accepted: Orders

## 2018-12-02 ENCOUNTER — Other Ambulatory Visit: Payer: Medicare Other

## 2018-12-11 ENCOUNTER — Other Ambulatory Visit (INDEPENDENT_AMBULATORY_CARE_PROVIDER_SITE_OTHER): Payer: Medicare Other

## 2018-12-11 ENCOUNTER — Other Ambulatory Visit: Payer: Self-pay

## 2018-12-11 DIAGNOSIS — R739 Hyperglycemia, unspecified: Secondary | ICD-10-CM | POA: Diagnosis not present

## 2018-12-11 DIAGNOSIS — I10 Essential (primary) hypertension: Secondary | ICD-10-CM

## 2018-12-11 DIAGNOSIS — E785 Hyperlipidemia, unspecified: Secondary | ICD-10-CM | POA: Diagnosis not present

## 2018-12-11 LAB — COMPREHENSIVE METABOLIC PANEL
ALT: 20 U/L (ref 0–53)
AST: 21 U/L (ref 0–37)
Albumin: 4 g/dL (ref 3.5–5.2)
Alkaline Phosphatase: 56 U/L (ref 39–117)
BUN: 15 mg/dL (ref 6–23)
CO2: 30 mEq/L (ref 19–32)
Calcium: 9.5 mg/dL (ref 8.4–10.5)
Chloride: 105 mEq/L (ref 96–112)
Creatinine, Ser: 0.93 mg/dL (ref 0.40–1.50)
GFR: 97.09 mL/min (ref >60.00)
Glucose, Bld: 97 mg/dL (ref 70–99)
Potassium: 4.2 mEq/L (ref 3.5–5.1)
Sodium: 139 mEq/L (ref 135–145)
Total Bilirubin: 1 mg/dL (ref 0.2–1.2)
Total Protein: 6.9 g/dL (ref 6.0–8.3)

## 2018-12-11 LAB — LIPID PANEL
Cholesterol: 192 mg/dL (ref 0–200)
HDL: 54.1 mg/dL (ref >39.00)
LDL Cholesterol: 101 mg/dL — ABNORMAL HIGH (ref 0–99)
NonHDL: 137.79
Total CHOL/HDL Ratio: 4
Triglycerides: 186 mg/dL — ABNORMAL HIGH (ref 0.0–149.0)
VLDL: 37.2 mg/dL (ref 0.0–40.0)

## 2018-12-11 LAB — HEMOGLOBIN A1C: Hgb A1c MFr Bld: 6.4 % (ref 4.6–6.5)

## 2018-12-12 ENCOUNTER — Telehealth: Payer: Self-pay | Admitting: Medical

## 2018-12-12 MED ORDER — METFORMIN HCL 500 MG PO TABS: ORAL_TABLET | ORAL | 3 refills | Status: DC

## 2018-12-12 NOTE — Telephone Encounter (Signed)
Rx refill metformin sent to pt pharmacy.

## 2018-12-23 ENCOUNTER — Ambulatory Visit (INDEPENDENT_AMBULATORY_CARE_PROVIDER_SITE_OTHER): Payer: Medicare Other | Admitting: *Deleted

## 2018-12-23 DIAGNOSIS — R001 Bradycardia, unspecified: Secondary | ICD-10-CM | POA: Diagnosis not present

## 2018-12-23 LAB — CUP PACEART REMOTE DEVICE CHECK
Battery Remaining Longevity: 74 mo
Battery Remaining Percentage: 89 %
Battery Voltage: 2.95 V
Brady Statistic AP VP Percent: 99 %
Brady Statistic AP VS Percent: 1 %
Brady Statistic AS VP Percent: 1 %
Brady Statistic AS VS Percent: 1 %
Brady Statistic RA Percent Paced: 99 %
Date Time Interrogation Session: 20201214031028
Implantable Lead Implant Date: 20160310
Implantable Lead Implant Date: 20160310
Implantable Lead Implant Date: 20160310
Implantable Lead Location: 753858
Implantable Lead Location: 753859
Implantable Lead Location: 753860
Implantable Lead Model: 1948
Implantable Pulse Generator Implant Date: 20160310
Lead Channel Impedance Value: 400 Ohm
Lead Channel Impedance Value: 550 Ohm
Lead Channel Impedance Value: 680 Ohm
Lead Channel Pacing Threshold Amplitude: 0.75 V
Lead Channel Pacing Threshold Amplitude: 0.75 V
Lead Channel Pacing Threshold Amplitude: 0.75 V
Lead Channel Pacing Threshold Pulse Width: 0.5 ms
Lead Channel Pacing Threshold Pulse Width: 0.5 ms
Lead Channel Pacing Threshold Pulse Width: 0.5 ms
Lead Channel Sensing Intrinsic Amplitude: 12 mV
Lead Channel Sensing Intrinsic Amplitude: 2.3 mV
Lead Channel Setting Pacing Amplitude: 2 V
Lead Channel Setting Pacing Amplitude: 2.5 V
Lead Channel Setting Pacing Amplitude: 2.5 V
Lead Channel Setting Pacing Pulse Width: 0.5 ms
Lead Channel Setting Pacing Pulse Width: 0.5 ms
Lead Channel Setting Sensing Sensitivity: 2 mV
Pulse Gen Model: 3242
Pulse Gen Serial Number: 7724805

## 2018-12-25 ENCOUNTER — Telehealth: Payer: Self-pay | Admitting: Student

## 2018-12-25 NOTE — Telephone Encounter (Signed)
New message    Patient calling, requesting  a letter stating reason for pacemaker. He is working with an Armed forces training and education officer that will assist him financially. Patient is going to call back and provide phone, fax, and company name that needs this information

## 2018-12-26 ENCOUNTER — Telehealth: Payer: Self-pay | Admitting: Internal Medicine

## 2018-12-26 ENCOUNTER — Ambulatory Visit (INDEPENDENT_AMBULATORY_CARE_PROVIDER_SITE_OTHER): Payer: Medicare Other

## 2018-12-26 DIAGNOSIS — Z95810 Presence of automatic (implantable) cardiac defibrillator: Secondary | ICD-10-CM | POA: Diagnosis not present

## 2018-12-26 DIAGNOSIS — I5022 Chronic systolic (congestive) heart failure: Secondary | ICD-10-CM

## 2018-12-26 NOTE — Telephone Encounter (Signed)
Patient was returning a call. Informed patient that medical records dept did not call him. Patient stated he had information to give the caller. Informed patient that I would take a message. Patient needed to give corrected email address for letter to be sent to: [email or web address].

## 2018-12-26 NOTE — Telephone Encounter (Signed)
Letter emailed per Pt request to [email or web address]

## 2018-12-26 NOTE — Telephone Encounter (Signed)
Patient calling back with information for the organization assisting him financially. He states the man helping him is Valere Dross and he works for [employer]. He states the letter can be sent to any of the following:  Valere Dross email: [email or web address]   [phone]  Mailing address: [place] [address]  [address] [place]  He says they want to know the reason for the pacemaker. He would like a call back if there is anything else needed.

## 2018-12-27 ENCOUNTER — Telehealth: Payer: Self-pay

## 2018-12-27 NOTE — Telephone Encounter (Signed)
Remote ICM transmission received.  Attempted call to patient regarding ICM remote transmission and left detailed message per DPR.  Advised to return call for any fluid symptoms or questions. Next ICM remote transmission scheduled 02/03/2019.

## 2018-12-27 NOTE — Progress Notes (Signed)
EPIC Encounter for ICM Monitoring  Patient Name: Phillip Booth is a 68 y.o. male Date: 12/27/2018 Primary Care Physican: [name], PA-C Primary Cardiologist:Skains Electrophysiologist: Allred Bi-V Pacing: >99% 09/19/2018 Weight: 235lbs per OV note   Attempted call to patient and unable to reach.  Left detailed message per DPR regarding transmission. Transmission reviewed.   Corvue thoracic impedance normal.   Prescribed:Furosemide 20 mg 1 tablet daily  Labs: 08/20/2018 Creatinine 0.92, BUN 10, Potassium 4.3, Sodium 137, GFR 98.39  Recommendations: Left voice mail with ICM number and encouraged to call if experiencing any fluid symptoms.  Follow-up plan: ICM clinic phone appointment on 02/03/2019.   91 day device clinic remote transmission 03/24/2019.    Copy of ICM check sent to Dr. Rayann Heman.   3 month ICM trend: 12/23/2018    1 Year ICM trend:       Rosalene Billings, RN 12/27/2018 4:08 PM

## 2019-01-01 ENCOUNTER — Other Ambulatory Visit: Payer: Self-pay | Admitting: Medical

## 2019-01-01 DIAGNOSIS — I10 Essential (primary) hypertension: Secondary | ICD-10-CM

## 2019-01-01 MED FILL — CARVEDILOL 6.25 MG TABLET: 6.25 | 90 days supply | Qty: 180 | Fill #0

## 2019-01-01 MED FILL — LISINOPRIL 40 MG TABLET: 40 | 30 days supply | Qty: 30 | Fill #1

## 2019-01-01 MED FILL — metFORMIN HCL 500 MG TABS: 500 | 30 days supply | Qty: 30 | Fill #3

## 2019-01-25 NOTE — Progress Notes (Signed)
PPM remote 

## 2019-02-03 ENCOUNTER — Ambulatory Visit (INDEPENDENT_AMBULATORY_CARE_PROVIDER_SITE_OTHER): Payer: Medicare Other

## 2019-02-03 DIAGNOSIS — I5022 Chronic systolic (congestive) heart failure: Secondary | ICD-10-CM

## 2019-02-03 DIAGNOSIS — Z95810 Presence of automatic (implantable) cardiac defibrillator: Secondary | ICD-10-CM

## 2019-02-05 NOTE — Progress Notes (Signed)
EPIC Encounter for ICM Monitoring  Patient Name: Phillip Booth is a 69 y.o. male Date: 02/05/2019 Primary Care Physican: [name], PA-C Primary Cardiologist:Skains Electrophysiologist: Allred Bi-V Pacing: >99% Last Weight: 235lbs per OV note   Spoke with patient and he is doing well.  He denies fluid symptoms.   Corvue thoracic impedance normal.   Prescribed:Furosemide 20 mg 1 tablet daily  Labs: 08/20/2018 Creatinine 0.92, BUN 10, Potassium 4.3, Sodium 137, GFR 98.39  Recommendations: No changes and encouraged to call if experiencing any fluid symptoms.  Follow-up plan: ICM clinic phone appointment on 03/10/2019.   91 day device clinic remote transmission 03/24/2019.    Copy of ICM check sent to Dr. Rayann Heman.   3 month ICM trend: 02/05/2019    1 Year ICM trend:       Rosalene Billings, RN 02/05/2019 1:59 PM

## 2019-02-26 ENCOUNTER — Other Ambulatory Visit: Payer: Self-pay | Admitting: Cardiology

## 2019-02-26 DIAGNOSIS — I5022 Chronic systolic (congestive) heart failure: Secondary | ICD-10-CM

## 2019-02-26 MED FILL — LEVOCETIRIZINE 5 MG TABLET: 5 | 30 days supply | Qty: 30 | Fill #3

## 2019-02-26 MED FILL — VENTOLIN HFA 90 MCG INHALER: 108 (90 BAS | 17 days supply | Qty: 18 | Fill #0

## 2019-02-26 MED FILL — ISOSORBIDE MN ER 30 MG TAB: 30 | 30 days supply | Qty: 30 | Fill #0

## 2019-02-26 MED FILL — metFORMIN HCL 500 MG TABS: 500 | 30 days supply | Qty: 30 | Fill #0

## 2019-02-26 MED FILL — MONTELUKAST SOD 10 MG TAB: 10 | 30 days supply | Qty: 30 | Fill #3

## 2019-03-03 ENCOUNTER — Ambulatory Visit: Payer: Medicare Other | Admitting: Medical

## 2019-03-10 ENCOUNTER — Ambulatory Visit (INDEPENDENT_AMBULATORY_CARE_PROVIDER_SITE_OTHER): Payer: HMO

## 2019-03-10 DIAGNOSIS — Z95810 Presence of automatic (implantable) cardiac defibrillator: Secondary | ICD-10-CM

## 2019-03-10 DIAGNOSIS — I5022 Chronic systolic (congestive) heart failure: Secondary | ICD-10-CM | POA: Diagnosis not present

## 2019-03-12 ENCOUNTER — Telehealth: Payer: Self-pay

## 2019-03-12 NOTE — Progress Notes (Signed)
EPIC Encounter for ICM Monitoring  Patient Name: Phillip Booth is a 69 y.o. male Date: 03/12/2019 Primary Care Physican: Patient, No Pcp Per Primary Cardiologist:Skains Electrophysiologist: Allred Bi-V Pacing: >99% Last Weight: 235lbs per OV note   Attempted call to patient and unable to reach.   Transmission reviewed.   Corvue thoracic impedance normal.   Prescribed:Furosemide 20 mg 1 tablet daily  Labs: 08/20/2018 Creatinine 0.92, BUN 10, Potassium 4.3, Sodium 137, GFR 98.39  Recommendations:  Unable to reach.    Follow-up plan: ICM clinic phone appointment on 04/14/2019.   91 day device clinic remote transmission 03/24/2019.    Copy of ICM check sent to Dr. Rayann Heman.   3 month ICM trend: 03/10/2019    1 Year ICM trend:       Rosalene Billings, RN 03/12/2019 10:53 AM

## 2019-03-12 NOTE — Telephone Encounter (Signed)
Remote ICM transmission received.  Attempted call to patient regarding ICM remote transmission and recording stated person is not accepting calls at this time.

## 2019-03-14 ENCOUNTER — Telehealth: Payer: Self-pay | Admitting: Cardiology

## 2019-03-14 NOTE — Telephone Encounter (Signed)
New Message  Pt c/o of Chest Pain: STAT if CP now or developed within 24 hours  1. Are you having CP right now? No  2. Are you experiencing any other symptoms (ex. SOB, nausea, vomiting, sweating)? SOB, sweating, lightheadedness  3. How long have you been experiencing CP? Last month and a half or two months  4. Is your CP continuous or coming and going? Coming and going  5. Have you taken Nitroglycerin? No ?

## 2019-03-14 NOTE — Telephone Encounter (Signed)
Pt called to report that he has been having chest discomfort with exertion for about a month.   He reports the discomfort is across his chest lasting a few minutes mostly when he is carrying a grocery bag from the car or anything like walking in from the outside. He has some SOB, and lightheadedness with it. It is usually relived with rest.   Pt says he has always had some peripheral edema in his feet only for a long time. He has gained weight over the past several weeks due to diet issues.   Pt does not have nitro at home but has been taking his Isosorbide 30 mg daily.   He agrees to an appt with Cecilie Kicks NP this Monday 03/17/19 at 3:15pm.. he will bring his new insurance information him. He declined an appt today. Pt denies any acute illness symptoms.   He will rest over the weekend and if his sympomts change or worsen or persist.. he will go to the ED.. he says he lives very close to [facility] and can have someone take him if needed.        COVID-19 Pre-Screening Questions:  . In the past 7 to 10 days have you had a cough,  shortness of breath, headache, congestion, fever (100 or greater) body aches, chills, sore throat, or sudden loss of taste or sense of smell? NO . Have you been around anyone with known Covid 19. NO . Have you been around anyone who is awaiting Covid 19 test results in the past 7 to 10 days? NO . Have you been around anyone who has been exposed to Covid 19, or has mentioned symptoms of Covid 19 within the past 7 to 10 days? NO  If you have any concerns/questions about symptoms patients report during screening (either on the phone or at threshold). Contact the provider seeing the patient or DOD for further guidance.  If neither are available contact a member of the leadership team.

## 2019-03-17 ENCOUNTER — Encounter: Payer: Self-pay | Admitting: Family Medicine

## 2019-03-17 ENCOUNTER — Encounter: Payer: Self-pay | Admitting: *Deleted

## 2019-03-17 ENCOUNTER — Other Ambulatory Visit: Payer: Self-pay

## 2019-03-17 ENCOUNTER — Ambulatory Visit (INDEPENDENT_AMBULATORY_CARE_PROVIDER_SITE_OTHER): Payer: HMO | Admitting: Family Medicine

## 2019-03-17 VITALS — BP 152/100 | HR 63 | Ht 76.0 in | Wt 238.4 lb

## 2019-03-17 DIAGNOSIS — I428 Other cardiomyopathies: Secondary | ICD-10-CM

## 2019-03-17 DIAGNOSIS — Z95810 Presence of automatic (implantable) cardiac defibrillator: Secondary | ICD-10-CM | POA: Diagnosis not present

## 2019-03-17 DIAGNOSIS — I471 Supraventricular tachycardia: Secondary | ICD-10-CM

## 2019-03-17 DIAGNOSIS — I10 Essential (primary) hypertension: Secondary | ICD-10-CM | POA: Diagnosis not present

## 2019-03-17 DIAGNOSIS — R079 Chest pain, unspecified: Secondary | ICD-10-CM

## 2019-03-17 MED ORDER — AMLODIPINE BESYLATE 10 MG PO TABS: 10.0000 mg | ORAL_TABLET | Freq: Every day | ORAL | 3 refills | Status: AC

## 2019-03-17 MED FILL — AMLODIPINE BESYLATE 10 MG T: 10 | 90 days supply | Qty: 90 | Fill #0

## 2019-03-17 NOTE — Patient Instructions (Addendum)
Medication Instructions:  Your physician has recommended you make the following change in your medication:  1.  START Amlodipine 10 mg taking 1 daily  *If you need a refill on your cardiac medications before your next appointment, please call your pharmacy*   Lab Work: None ordered  If you have labs (blood work) drawn today and your tests are completely normal, you will receive your results only by: Phillip Booth MyChart Message (if you have MyChart) OR . A paper copy in the mail If you have any lab test that is abnormal or we need to change your treatment, we will call you to review the results.   Testing/Procedures: Your physician has requested that you have a lexiscan myoview. For further information please visit [email or web address]. Please follow instruction sheet, as given.     Follow-Up: At [facility], you and your health needs are our priority.  As part of our continuing mission to provide you with exceptional heart care, we have created designated Provider Care Teams.  These Care Teams include your primary Cardiologist (physician) and Advanced Practice Providers (APPs -  Physician Assistants and Nurse Practitioners) who all work together to provide you with the care you need, when you need it.  We recommend signing up for the patient portal called "MyChart".  Sign up information is provided on this After Visit Summary.  MyChart is used to connect with patients for Virtual Visits (Telemedicine).  Patients are able to view lab/test results, encounter notes, upcoming appointments, etc.  Non-urgent messages can be sent to your provider as well.   To learn more about what you can do with MyChart, go to [email or web address].    Your next appointment:   1-2 WEEKS AFTER STRESS TEST   The format for your next appointment:   In Person  Provider:   You may see Dr. Marlou Porch, or one of the following Advanced Practice Providers on your designated Care Team:    Cecilie Kicks, NP  Truitt Merle, NP    Other Instructions   Cardiac Nuclear Scan A cardiac nuclear scan is a test that measures blood flow to the heart when a person is resting and when he or she is exercising. The test looks for problems such as:  Not enough blood reaching a portion of the heart.  The heart muscle not working normally. You may need this test if:  You have heart disease.  You have had abnormal lab results.  You have had heart surgery or a balloon procedure to open up blocked arteries (angioplasty).  You have chest pain.  You have shortness of breath. In this test, a radioactive dye (tracer) is injected into your bloodstream. After the tracer has traveled to your heart, an imaging device is used to measure how much of the tracer is absorbed by or distributed to various areas of your heart. This procedure is usually done at a hospital and takes 2-4 hours. Tell a health care provider about:  Any allergies you have.  All medicines you are taking, including vitamins, herbs, eye drops, creams, and over-the-counter medicines.  Any problems you or family members have had with anesthetic medicines.  Any blood disorders you have.  Any surgeries you have had.  Any medical conditions you have.  Whether you are pregnant or may be pregnant. What are the risks? Generally, this is a safe procedure. However, problems may occur, including:  Serious chest pain and heart attack. This is only a risk if the stress portion of the test is  done.  Rapid heartbeat.  Sensation of warmth in your chest. This usually passes quickly.  Allergic reaction to the tracer. What happens before the procedure?  Ask your health care provider about changing or stopping your regular medicines. This is especially important if you are taking diabetes medicines or blood thinners.  Follow instructions from your health care provider about eating or drinking restrictions.  Remove your jewelry on the day of the  procedure. What happens during the procedure?  An IV will be inserted into one of your veins.  Your health care provider will inject a small amount of radioactive tracer through the IV.  You will wait for 20-40 minutes while the tracer travels through your bloodstream.  Your heart activity will be monitored with an electrocardiogram (ECG).  You will lie down on an exam table.  Images of your heart will be taken for about 15-20 minutes.  You may also have a stress test. For this test, one of the following may be done: ? You will exercise on a treadmill or stationary bike. While you exercise, your heart's activity will be monitored with an ECG, and your blood pressure will be checked. ? You will be given medicines that will increase blood flow to parts of your heart. This is done if you are unable to exercise.  When blood flow to your heart has peaked, a tracer will again be injected through the IV.  After 20-40 minutes, you will get back on the exam table and have more images taken of your heart.  Depending on the type of tracer used, scans may need to be repeated 3-4 hours later.  Your IV line will be removed when the procedure is over. The procedure may vary among health care providers and hospitals. What happens after the procedure?  Unless your health care provider tells you otherwise, you may return to your normal schedule, including diet, activities, and medicines.  Unless your health care provider tells you otherwise, you may increase your fluid intake. This will help to flush the contrast dye from your body. Drink enough fluid to keep your urine pale yellow.  Ask your health care provider, or the department that is doing the test: ? When will my results be ready? ? How will I get my results? Summary  A cardiac nuclear scan measures the blood flow to the heart when a person is resting and when he or she is exercising.  Tell your health care provider if you are  pregnant.  Before the procedure, ask your health care provider about changing or stopping your regular medicines. This is especially important if you are taking diabetes medicines or blood thinners.  After the procedure, unless your health care provider tells you otherwise, increase your fluid intake. This will help flush the contrast dye from your body.  After the procedure, unless your health care provider tells you otherwise, you may return to your normal schedule, including diet, activities, and medicines. This information is not intended to replace advice given to you by your health care provider. Make sure you discuss any questions you have with your health care provider. Document Revised: 06/11/2017 Document Reviewed: 06/11/2017 Elsevier Patient Education  [place].

## 2019-03-17 NOTE — Progress Notes (Signed)
Cardiology Office Note  Date: 03/17/2019   ID: Kalo Dahlke, DOB 02/01/1950, MRN [id number]  PCP:  Patient, No Pcp Per  Cardiologist:  No primary care provider on file. Electrophysiologist:  None   Chief Complaint: Chronic systolic HF (HFrEF)  History of Present Illness: Jerion Danger is a 69 y.o. male with a history of chronic systolic heart failure status post BiV ICD placement 03/19/2014 for symptomatic bradycardia.  History of coronary artery disease by cath with moderate disease in 03/2014, CVA, HLD, HTN, ICM, PAF.  Patient admits to some chest pressure "stress" when walking from his car to his apartment.  States it is relieved once he gets to the apartment and sits down.  He denies any radiation, or associated nausea, vomiting, or diaphoresis, dizziness.  He does have some mild dyspnea when ambulating during this.  States she does have some discomfort after he eats sometimes.  He is not able to rate be severity of the pressure.  States he feels a little discomfort when he lays on his left side.  He had a low risk Lexiscan Myoview stress test in February 2019.  He has a history of chronic diastolic and systolic heart failure  With BiV ICD.  Recent remote device check was normal.  Last echocardiogram showed an EF of 45 to 50%.  With moderate asymmetric left ventricular hypertrophy of the septal wall.  Left ventricular diastolic Doppler parameters are consistent versus restricted filling.   Past Medical History:  Diagnosis Date  . Allergy   . CAD (coronary artery disease)    a. moderate by cath 03/2014  . Cataract   . CVA (cerebral infarction)    a. diagnosis not clear  . Hyperlipidemia   . Hypertension   . Non-ischemic cardiomyopathy ([place])    a. EF 45% by echo 03/2014  . Paroxysmal atrial fibrillation ([place]) 05/01/2014   asymptomatic, documented on PPM interrogation,  chads2vasc score is at least 6.  He is contemplating anticoagulation  . Symptomatic bradycardia    a. s/p STJ CRTP  implanted by Dr Rayann Heman  . Vertigo     Past Surgical History:  Procedure Laterality Date  . ABDOMINAL SURGERY    . APPENDECTOMY    . BI-VENTRICULAR PACEMAKER INSERTION N/A 03/19/2014   STJ CRTP implanted by Dr Rayann Heman  . EYE SURGERY     Cataract removed from Right eye  . HERNIA REPAIR    . LEFT HEART CATHETERIZATION WITH CORONARY ANGIOGRAM N/A 03/19/2014   Procedure: LEFT HEART CATHETERIZATION WITH CORONARY ANGIOGRAM;  Surgeon: [name], MD;  Location: [facility] CATH LAB;  Service: Cardiovascular;  Laterality: N/A;    Current Outpatient Medications  Medication Sig Dispense Refill  . albuterol (VENTOLIN HFA) 108 (90 Base) MCG/ACT inhaler INHALE 2 PUFFS INTO THE LUNGS EVERY 6 (SIX) HOURS AS NEEDED FOR WHEEZING OR SHORTNESS OF BREATH. 18 g 2  . amLODipine (NORVASC) 10 MG tablet TAKE 1 TABLET (10 MG TOTAL) BY MOUTH DAILY. 90 tablet 1  . aspirin 81 MG tablet Take 81 mg by mouth daily.     Marland Kitchen azelastine (ASTELIN) 0.1 % nasal spray Place 2 sprays into both nostrils 2 (two) times daily. Use in each nostril as directed 30 mL 1  . carvedilol (COREG) 6.25 MG tablet TAKE 1 TABLET BY MOUTH TWICE DAILY WITH A MEAL 180 tablet 0  . Cetirizine HCl (ZYRTEC ALLERGY) 10 MG CAPS Take 1 capsule (10 mg total) by mouth daily. 30 capsule 0  . fluticasone (FLONASE) 50 MCG/ACT  nasal spray Place 2 sprays into both nostrils daily. 16 g 2  . fluticasone (FLOVENT HFA) 110 MCG/ACT inhaler Inhale 2 puffs into the lungs 2 (two) times daily. 1 Inhaler 3  . furosemide (LASIX) 20 MG tablet TAKE 1 TABLET BY MOUTH DAILY 90 tablet 2  . isosorbide mononitrate (IMDUR) 30 MG 24 hr tablet TAKE 1 TABLET (30 MG TOTAL) BY MOUTH DAILY. 30 tablet 5  . levocetirizine (XYZAL) 5 MG tablet Take 1 tablet (5 mg total) by mouth every evening. 30 tablet 3  . lisinopril (ZESTRIL) 40 MG tablet TAKE 1 TABLET (40 MG TOTAL) BY MOUTH DAILY. 30 tablet 3  . metFORMIN (GLUCOPHAGE) 500 MG tablet 1 tab po q day 30 tablet 3  . montelukast (SINGULAIR) 10 MG  tablet Take 1 tablet (10 mg total) by mouth at bedtime. 30 tablet 3  . rosuvastatin (CRESTOR) 10 MG tablet Take 1 tablet (10 mg total) by mouth daily. 30 tablet 11  . tamsulosin (FLOMAX) 0.4 MG CAPS capsule TAKE ONE CAPSULE BY MOUTH DAILY 90 capsule 1  . VENTOLIN HFA 108 (90 Base) MCG/ACT inhaler INHALE 2 PUFFS BY MOUTH INTO THE LUNGS EVERY 6 (SIX) HOURS AS NEEDED FOR WHEEZING OR SHORTNESS OF BREATH. 18 g 2   No current facility-administered medications for this visit.   Allergies:  Lactose intolerance (gi)   Social History: The patient  reports that he has never smoked. He has never used smokeless tobacco. He reports current alcohol use. He reports that he does not use drugs.   Family History: The patient's family history includes Cancer in his mother; Diabetes in his mother; Emphysema in his father; Heart Problems in his brother and another family member; Heart disease in his mother; Hypertension in his father and mother; Other in his mother.   ROS:  Please see the history of present illness. Otherwise, complete review of systems is positive for none.  All other systems are reviewed and negative.   Physical Exam: VS:  There were no vitals taken for this visit., BMI There is no height or weight on file to calculate BMI.  Wt Readings from Last 3 Encounters:  11/28/18 235 lb 3.2 oz (106.7 kg)  09/19/18 235 lb (106.6 kg)  08/20/18 234 lb 12.8 oz (106.5 kg)    General: Patient appears comfortable at rest. Neck: Supple, no elevated JVP or carotid bruits, no thyromegaly. Lungs: Clear to auscultation, nonlabored breathing at rest. Cardiac: Regular rate and rhythm, no S3 or significant systolic murmur, no pericardial rub. Extremities: No pitting edema, distal pulses 2+. Skin: Warm and dry. Musculoskeletal: No kyphosis. Neuropsychiatric: Alert and oriented x3, affect grossly appropriate.  ECG:  An ECG dated 03/17/2019 was personally reviewed today and demonstrated:  V paced rhythm rate of  63.  Recent Labwork: 08/20/2018: Pro B Natriuretic peptide (BNP) 164.0 12/11/2018: ALT 20; AST 21; BUN 15; Creatinine, Ser 0.93; Potassium 4.2; Sodium 139     Component Value Date/Time   CHOL 192 12/11/2018 1345   TRIG 186.0 (H) 12/11/2018 1345   HDL 54.10 12/11/2018 1345   CHOLHDL 4 12/11/2018 1345   VLDL 37.2 12/11/2018 1345   LDLCALC 101 (H) 12/11/2018 1345   BiV okay Other Studies Reviewed Today:  Echocardiogram 09/20/2018 1. The left ventricle has mildly reduced systolic function, with an ejection fraction of 45-50%.The cavity size was mildly dilated. There is moderate asymmetric left ventricular hypertrophyof the septal wall. Left ventricular diastolic Doppler parameters are consistent with restrictive filling. Elevated mean left atrial pressure The  E/e' is 12. There is abnormal septal motionconsistent with RV pacemaker. Left ventricular diffuse hypokinesis. 2. Left atrial size was mildly dilated. 3. Right atrial size was mildly dilated.  EKG the patient 4. Trivial pericardial effusion is present. 5. Mild thickening of the mitral valve leaflet. 6. The aortic valve is tricuspid. Mild thickening of the aortic valve. Mild calcification of the aortic valve. Aortic valve regurgitation is mild by color flow Doppler. 7. The aorta is normal unless otherwise noted. The patient has discomfort is not anginal he said I had the stress of anger 1 hour where a walk from her car to the apartment and I did a little stress this does not help partially on a monitor charity care was away sometimes it happens when I am sitting still and it does not sound like true angina but I am if you want to weaken. The aortic root and aortic arch are normal in size and structure. 9. There is mild dilatation of the ascending aorta measuring 37 mm.   Lexiscan Myoview stress test February 22, 2017  Nuclear stress EF: 49%.  The study is normal.  This is a low risk study.   Normal pharmacologic nuclear stress test  with no evidence for prior infarct or ischemia.  LVEF calculated at 49% but appears better, correlation with an echocardiogram is recommended.    Assessment and Plan:  1. ICD (implantable cardioverter-defibrillator) in place   2. Non-ischemic cardiomyopathy ([place])   3. Essential hypertension   4. Atrial tachycardia ([place])    1. ICD (implantable cardioverter-defibrillator) in place Recent remote device check was normal. Sees EP Dr Rayann Heman  Has a BIV ICD  2. Non-ischemic cardiomyopathy [facility]) Recent echocardiogram September 20, 2018 showed an ejection fraction of 45 to 50%.  Cavity size is mildly dilated.  There is moderate asymmetric left ventricular hypertrophy of the septal wall.  Left ventricular diastolic parameters consistent with restrictive filling.  Continue carvedilol 6.25 p.o. twice daily, Lasix 20 mg daily,  Imdur 30 mg daily, lisinopril 40 mg daily.   3. Essential hypertension Blood pressure today 152/100.  Patient states he has been not been taking his amlodipine 10 mg as ordered.  Start amlodipine 10 mg p.o. twice daily.  Patient had not been taking his amlodipine.  4. Atrial tachycardia ([place]) Patient denies any symptoms of tachycardia/palpitations, fluttering/arrhythmias.  6.  Chest pressure  Patient states he has a pressure that comes when walking from his car to his apartment.  He denies any radiation to neck, arm, back, jaw.  Denies any nausea, vomiting, or diaphoresis.  Get a nuclear Lexiscan Myoview stress test.  .  Medication Adjustments/Labs and Tests Ordered: Current medicines are reviewed at length with the patient today.  Concerns regarding medicines are outlined above.   Disposition: Follow-up with Dr Rayann Heman 6 months  Signed, Levell July, NP 03/17/2019 12:11 PM    [facility]

## 2019-03-19 ENCOUNTER — Telehealth (HOSPITAL_COMMUNITY): Payer: Self-pay | Admitting: *Deleted

## 2019-03-19 NOTE — Telephone Encounter (Signed)
Patient given detailed instructions per Myocardial Perfusion Study Information Sheet for the test on 03/21/19 at 7:30. Patient notified to arrive 15 minutes early and that it is imperative to arrive on time for appointment to keep from having the test rescheduled.  If you need to cancel or reschedule your appointment, please call the office within 24 hours of your appointment. . Patient verbalized understanding.Phillip Booth

## 2019-03-20 ENCOUNTER — Encounter (HOSPITAL_COMMUNITY): Payer: HMO

## 2019-03-21 ENCOUNTER — Ambulatory Visit (HOSPITAL_COMMUNITY): Payer: HMO | Attending: Cardiology

## 2019-03-21 ENCOUNTER — Other Ambulatory Visit: Payer: Self-pay

## 2019-03-21 DIAGNOSIS — R079 Chest pain, unspecified: Secondary | ICD-10-CM | POA: Diagnosis present

## 2019-03-21 LAB — MYOCARDIAL PERFUSION IMAGING
LV dias vol: 150 mL (ref 62–150)
LV sys vol: 78 mL
Peak HR: 85 {beats}/min
Rest HR: 68 {beats}/min
SDS: 1
SRS: 0
SSS: 1
TID: 1.07

## 2019-03-21 MED ORDER — TECHNETIUM TC 99M TETROFOSMIN IV KIT
10.1000 | PACK | Freq: Once | INTRAVENOUS | Status: AC | PRN
  Administered 2019-03-21: 10.1 via INTRAVENOUS
  Filled 2019-03-21: qty 11

## 2019-03-21 MED ORDER — TECHNETIUM TC 99M TETROFOSMIN IV KIT
32.6000 | PACK | Freq: Once | INTRAVENOUS | Status: AC | PRN
  Administered 2019-03-21: 32.6 via INTRAVENOUS
  Filled 2019-03-21: qty 33

## 2019-03-21 MED ORDER — REGADENOSON 0.4 MG/5ML IV SOLN
0.4000 mg | Freq: Once | INTRAVENOUS | Status: AC
  Administered 2019-03-21: 0.4 mg via INTRAVENOUS

## 2019-03-24 ENCOUNTER — Ambulatory Visit (INDEPENDENT_AMBULATORY_CARE_PROVIDER_SITE_OTHER): Payer: HMO | Admitting: *Deleted

## 2019-03-24 DIAGNOSIS — R001 Bradycardia, unspecified: Secondary | ICD-10-CM

## 2019-03-24 LAB — CUP PACEART REMOTE DEVICE CHECK
Battery Remaining Longevity: 67 mo
Battery Remaining Percentage: 80 %
Battery Voltage: 2.93 V
Brady Statistic AP VP Percent: 98 %
Brady Statistic AP VS Percent: 1 %
Brady Statistic AS VP Percent: 1.1 %
Brady Statistic AS VS Percent: 1 %
Brady Statistic RA Percent Paced: 98 %
Date Time Interrogation Session: 20210315044815
Implantable Lead Implant Date: 20160310
Implantable Lead Implant Date: 20160310
Implantable Lead Implant Date: 20160310
Implantable Lead Location: 753858
Implantable Lead Location: 753859
Implantable Lead Location: 753860
Implantable Lead Model: 1948
Implantable Pulse Generator Implant Date: 20160310
Lead Channel Impedance Value: 400 Ohm
Lead Channel Impedance Value: 540 Ohm
Lead Channel Impedance Value: 700 Ohm
Lead Channel Pacing Threshold Amplitude: 0.75 V
Lead Channel Pacing Threshold Amplitude: 0.75 V
Lead Channel Pacing Threshold Amplitude: 0.75 V
Lead Channel Pacing Threshold Pulse Width: 0.5 ms
Lead Channel Pacing Threshold Pulse Width: 0.5 ms
Lead Channel Pacing Threshold Pulse Width: 0.5 ms
Lead Channel Sensing Intrinsic Amplitude: 12 mV
Lead Channel Sensing Intrinsic Amplitude: 2.6 mV
Lead Channel Setting Pacing Amplitude: 2 V
Lead Channel Setting Pacing Amplitude: 2.5 V
Lead Channel Setting Pacing Amplitude: 2.5 V
Lead Channel Setting Pacing Pulse Width: 0.5 ms
Lead Channel Setting Pacing Pulse Width: 0.5 ms
Lead Channel Setting Sensing Sensitivity: 2 mV
Pulse Gen Model: 3242
Pulse Gen Serial Number: 7724805

## 2019-03-24 NOTE — Progress Notes (Signed)
PPM Remote  

## 2019-04-04 ENCOUNTER — Ambulatory Visit: Payer: HMO | Admitting: Cardiology

## 2019-04-07 ENCOUNTER — Other Ambulatory Visit: Payer: Self-pay | Admitting: General Practice

## 2019-04-07 NOTE — Patient Outreach (Signed)
Client is newly enrolled in the Special Needs Plan program with Systolic Heart Failure, recent appointment with Cardiology care team earlier this month. Individualized Care Plan (ICP) completed with information from the  Health Risk Assessment on file. Client also has a history of Atrial Fibrillation, Stroke, Hypertension and Heart Disease. Will send an introductory letter with ICP to the primary provider and client, along with educational materials. No ED or acute inpatient visits in 2020, assigned RN Care Coordinator will follow up within 3 months.

## 2019-04-14 ENCOUNTER — Ambulatory Visit (INDEPENDENT_AMBULATORY_CARE_PROVIDER_SITE_OTHER): Payer: HMO

## 2019-04-14 DIAGNOSIS — I5022 Chronic systolic (congestive) heart failure: Secondary | ICD-10-CM | POA: Diagnosis not present

## 2019-04-14 DIAGNOSIS — Z95810 Presence of automatic (implantable) cardiac defibrillator: Secondary | ICD-10-CM

## 2019-04-15 ENCOUNTER — Telehealth: Payer: Self-pay

## 2019-04-15 NOTE — Telephone Encounter (Signed)
Remote ICM transmission received.  Attempted call to patient regarding ICM remote transmission and left detailed message per DPR.  Advised to return call for any fluid symptoms or questions. Next ICM remote transmission scheduled 05/19/2019.     

## 2019-04-15 NOTE — Progress Notes (Signed)
EPIC Encounter for ICM Monitoring  Patient Name: Phillip Booth is a 69 y.o. male Date: 04/15/2019 Primary Care Physican: [name], PA-C Primary Cardiologist:Skains Electrophysiologist: Allred Bi-V Pacing: >99% 03/17/2019 OfficeWeight: 238lbs    Attempted call to patient and unable to reach.  Left detailed message per DPR regarding transmission. Transmission reviewed.   Corvue thoracic impedance normal.   Prescribed:Furosemide 20 mg 1 tablet daily  Labs: 12/29/2018 Creatinine 0.93, BUN 15, Potassium 4.2, Sodium 139, GFR 97.09 08/20/2018 Creatinine 0.92, BUN 10, Potassium 4.3, Sodium 137, GFR 98.39  Recommendations: Left voice mail with ICM number and encouraged to call if experiencing any fluid symptoms.  Follow-up plan: ICM clinic phone appointment on5/10/2019. 91 day device clinic remote transmission 06/23/2019. Office visit with Dr Marlou Porch on 04/18/2019.  Copy of ICM check sent to Dr.Allred.   3 month ICM trend: 04/14/2019    1 Year ICM trend:       Rosalene Billings, RN 04/15/2019 8:33 AM

## 2019-04-18 ENCOUNTER — Ambulatory Visit (INDEPENDENT_AMBULATORY_CARE_PROVIDER_SITE_OTHER): Payer: HMO | Admitting: Cardiology

## 2019-04-18 ENCOUNTER — Other Ambulatory Visit: Payer: Self-pay

## 2019-04-18 ENCOUNTER — Encounter: Payer: Self-pay | Admitting: Cardiology

## 2019-04-18 VITALS — BP 120/80 | HR 60 | Ht 76.0 in | Wt 234.0 lb

## 2019-04-18 DIAGNOSIS — Z95810 Presence of automatic (implantable) cardiac defibrillator: Secondary | ICD-10-CM | POA: Diagnosis not present

## 2019-04-18 DIAGNOSIS — I5022 Chronic systolic (congestive) heart failure: Secondary | ICD-10-CM | POA: Diagnosis not present

## 2019-04-18 NOTE — Progress Notes (Signed)
Cardiology Office Note:    Date:  04/18/2019   ID:  Phillip Booth, DOB 1950-10-27, MRN [id number]  PCP:  Phillip Pai, PA-C  Cardiologist:  Phillip Furbish, MD  Electrophysiologist:  None   Referring MD: Phillip Fanny, DO     History of Present Illness:    Phillip Booth is a 69 y.o. male with chronic systolic heart failure ICD BiV here for follow-up of hypertension.  Overall he is feeling better.  No further chest discomfort.  Nuclear stress test was performed and was low overall risk.  He is a Primary school teacher.  Past Medical History:  Diagnosis Date  . Allergy   . CAD (coronary artery disease)    a. moderate by cath 03/2014  . Cataract   . CVA (cerebral infarction)    a. diagnosis not clear  . Hyperlipidemia   . Hypertension   . Non-ischemic cardiomyopathy ([place])    a. EF 45% by echo 03/2014  . Paroxysmal atrial fibrillation ([place]) 05/01/2014   asymptomatic, documented on PPM interrogation,  chads2vasc score is at least 6.  He is contemplating anticoagulation  . Symptomatic bradycardia    a. s/p STJ CRTP implanted by Dr Phillip Booth  . Vertigo     Past Surgical History:  Procedure Laterality Date  . ABDOMINAL SURGERY    . APPENDECTOMY    . BI-VENTRICULAR PACEMAKER INSERTION N/A 03/19/2014   STJ CRTP implanted by Dr Phillip Booth  . EYE SURGERY     Cataract removed from Right eye  . HERNIA REPAIR    . LEFT HEART CATHETERIZATION WITH CORONARY ANGIOGRAM N/A 03/19/2014   Procedure: LEFT HEART CATHETERIZATION WITH CORONARY ANGIOGRAM;  Surgeon: Phillip Sine, MD;  Location: [facility] CATH LAB;  Service: Cardiovascular;  Laterality: N/A;    Current Medications: Current Meds  Medication Sig  . albuterol (VENTOLIN HFA) 108 (90 Base) MCG/ACT inhaler INHALE 2 PUFFS INTO THE LUNGS EVERY 6 (SIX) HOURS AS NEEDED FOR WHEEZING OR SHORTNESS OF BREATH.  Marland Kitchen amLODipine (NORVASC) 10 MG tablet Take 1 tablet (10 mg total) by mouth daily.  Marland Kitchen aspirin 81 MG tablet Take 81 mg by mouth daily.   Marland Kitchen azelastine (ASTELIN) 0.1 %  nasal spray Place 2 sprays into both nostrils 2 (two) times daily. Use in each nostril as directed  . carvedilol (COREG) 6.25 MG tablet TAKE 1 TABLET BY MOUTH TWICE DAILY WITH A MEAL  . Cetirizine HCl (ZYRTEC ALLERGY) 10 MG CAPS Take 1 capsule (10 mg total) by mouth daily.  . fluticasone (FLONASE) 50 MCG/ACT nasal spray Place 2 sprays into both nostrils daily.  . fluticasone (FLOVENT HFA) 110 MCG/ACT inhaler Inhale 2 puffs into the lungs 2 (two) times daily.  . furosemide (LASIX) 20 MG tablet TAKE 1 TABLET BY MOUTH DAILY  . isosorbide mononitrate (IMDUR) 30 MG 24 hr tablet TAKE 1 TABLET (30 MG TOTAL) BY MOUTH DAILY.  Marland Kitchen levocetirizine (XYZAL) 5 MG tablet Take 1 tablet (5 mg total) by mouth every evening.  Marland Kitchen lisinopril (ZESTRIL) 40 MG tablet TAKE 1 TABLET (40 MG TOTAL) BY MOUTH DAILY.  . metFORMIN (GLUCOPHAGE-XR) 500 MG 24 hr tablet Take 500 mg by mouth daily.  . montelukast (SINGULAIR) 10 MG tablet Take 1 tablet (10 mg total) by mouth at bedtime.  . rosuvastatin (CRESTOR) 10 MG tablet Take 1 tablet (10 mg total) by mouth daily.  . tamsulosin (FLOMAX) 0.4 MG CAPS capsule TAKE ONE CAPSULE BY MOUTH DAILY     Allergies:   Lactose intolerance (gi)   Social History  Socioeconomic History  . Marital status: Married    Spouse name: Not on file  . Number of children: Not on file  . Years of education: Not on file  . Highest education level: Not on file  Occupational History  . Not on file  Tobacco Use  . Smoking status: Never Smoker  . Smokeless tobacco: Never Used  Substance and Sexual Activity  . Alcohol use: Yes    Comment: occasional  . Drug use: No  . Sexual activity: Yes  Other Topics Concern  . Not on file  Social History Narrative  . Not on file   Social Determinants of Health   Financial Resource Strain:   . Difficulty of Paying Living Expenses:   Food Insecurity:   . Worried About Charity fundraiser in the Last Year:   . Arboriculturist in the Last Year:     Transportation Needs:   . Film/video editor (Medical):   Marland Kitchen Lack of Transportation (Non-Medical):   Physical Activity:   . Days of Exercise per Week:   . Minutes of Exercise per Session:   Stress:   . Feeling of Stress :   Social Connections:   . Frequency of Communication with Friends and Family:   . Frequency of Social Gatherings with Friends and Family:   . Attends Religious Services:   . Active Member of Clubs or Organizations:   . Attends Archivist Meetings:   Marland Kitchen Marital Status:      Family History: The patient's family history includes Cancer in his mother; Diabetes in his mother; Emphysema in his father; Heart Problems in his brother and another family member; Heart disease in his mother; Hypertension in his father and mother; Other in his mother.  Recent Labs: 08/20/2018: Pro B Natriuretic peptide (BNP) 164.0 12/11/2018: ALT 20; BUN 15; Creatinine, Ser 0.93; Potassium 4.2; Sodium 139  Recent Lipid Panel    Component Value Date/Time   CHOL 192 12/11/2018 1345   TRIG 186.0 (H) 12/11/2018 1345   HDL 54.10 12/11/2018 1345   CHOLHDL 4 12/11/2018 1345   VLDL 37.2 12/11/2018 1345   LDLCALC 101 (H) 12/11/2018 1345    Physical Exam:    VS:  BP 120/80   Pulse 60   Ht 6\' 4"  (1.93 m)   Wt 234 lb (106.1 kg)   SpO2 96%   BMI 28.48 kg/m     Wt Readings from Last 3 Encounters:  04/18/19 234 lb (106.1 kg)  03/21/19 238 lb (108 kg)  03/17/19 238 lb 6.4 oz (108.1 kg)     GEN:  Well nourished, well developed in no acute distress HEENT: Normal NECK: No JVD; No carotid bruits LYMPHATICS: No lymphadenopathy CARDIAC: RRR, no murmurs, rubs, gallops RESPIRATORY:  Clear to auscultation without rales, wheezing or rhonchi  ABDOMEN: Soft, non-tender, non-distended MUSCULOSKELETAL:  No edema; No deformity  SKIN: Warm and dry NEUROLOGIC:  Alert and oriented x 3 PSYCHIATRIC:  Normal affect   ASSESSMENT:    1. Chronic systolic congestive heart failure ([place])   2.  ICD (implantable cardioverter-defibrillator) in place    PLAN:    In order of problems listed above:  Chest pressure  - NUC stress low risk, EF has slightly improved on stress.  -Continue with current medications.  Now his blood pressure is controlled he does feel better.  No further chest pain.  Continue to work on weight loss.  Essential hypertension-amlodipine 10 mg a day.  Doing better.  Chronic  systolic heart failure -EF improved on nuclear stress test.  Continue with current medications.  Blood pressure control.  BiV ICD -EKG today shows AV pacing.    Medication Adjustments/Labs and Tests Ordered: Current medicines are reviewed at length with the patient today.  Concerns regarding medicines are outlined above.  No orders of the defined types were placed in this encounter.  No orders of the defined types were placed in this encounter.   Patient Instructions  Medication Instructions:  The current medical regimen is effective;  continue present plan and medications.  *If you need a refill on your cardiac medications before your next appointment, please call your pharmacy*  Follow-Up: At [facility], you and your health needs are our priority.  As part of our continuing mission to provide you with exceptional heart care, we have created designated Provider Care Teams.  These Care Teams include your primary Cardiologist (physician) and Advanced Practice Providers (APPs -  Physician Assistants and Nurse Practitioners) who all work together to provide you with the care you need, when you need it.  We recommend signing up for the patient portal called "MyChart".  Sign up information is provided on this After Visit Summary.  MyChart is used to connect with patients for Virtual Visits (Telemedicine).  Patients are able to view lab/test results, encounter notes, upcoming appointments, etc.  Non-urgent messages can be sent to your provider as well.   To learn more about what you can  do with MyChart, go to [email or web address].    Your next appointment:   6 month(s)  The format for your next appointment:   In Person  Provider:   You may see Phillip Furbish, MD or one of the following Advanced Practice Providers on your designated Care Team:    Truitt Merle, NP  Cecilie Kicks, NP  Kathyrn Drown, NP   Thank you for choosing [facility]!!        Signed, Phillip Furbish, MD  04/18/2019 5:30 PM    [place]

## 2019-04-18 NOTE — Patient Instructions (Signed)
Medication Instructions:  The current medical regimen is effective;  continue present plan and medications.  *If you need a refill on your cardiac medications before your next appointment, please call your pharmacy*  Follow-Up: At [facility], you and your health needs are our priority.  As part of our continuing mission to provide you with exceptional heart care, we have created designated Provider Care Teams.  These Care Teams include your primary Cardiologist (physician) and Advanced Practice Providers (APPs -  Physician Assistants and Nurse Practitioners) who all work together to provide you with the care you need, when you need it.  We recommend signing up for the patient portal called "MyChart".  Sign up information is provided on this After Visit Summary.  MyChart is used to connect with patients for Virtual Visits (Telemedicine).  Patients are able to view lab/test results, encounter notes, upcoming appointments, etc.  Non-urgent messages can be sent to your provider as well.   To learn more about what you can do with MyChart, go to [email or web address].    Your next appointment:   6 month(s)  The format for your next appointment:   In Person  Provider:   You may see Candee Furbish, MD or one of the following Advanced Practice Providers on your designated Care Team:    Truitt Merle, NP  Cecilie Kicks, NP  Kathyrn Drown, NP   Thank you for choosing [facility]!!

## 2019-05-05 NOTE — Addendum Note (Signed)
Addended by: Janan Halter F on: 05/05/2019 11:08 AM   Modules accepted: Orders

## 2019-05-06 NOTE — Addendum Note (Signed)
Addended by: Maren Beach, Rorey Hodges A on: 05/06/2019 04:35 PM   Modules accepted: Orders

## 2019-05-19 ENCOUNTER — Ambulatory Visit (INDEPENDENT_AMBULATORY_CARE_PROVIDER_SITE_OTHER): Payer: HMO

## 2019-05-19 DIAGNOSIS — I5022 Chronic systolic (congestive) heart failure: Secondary | ICD-10-CM | POA: Diagnosis not present

## 2019-05-19 DIAGNOSIS — Z95810 Presence of automatic (implantable) cardiac defibrillator: Secondary | ICD-10-CM | POA: Diagnosis not present

## 2019-05-21 NOTE — Progress Notes (Signed)
EPIC Encounter for ICM Monitoring  Patient Name: Phillip Booth is a 69 y.o. male Date: 05/21/2019 Primary Care Physican: [name], PA-C Primary Cardiologist:Skains Electrophysiologist: Allred Bi-V Pacing: >99% 05/21/2019 OfficeWeight: 234lbs   AT/AF Burden: <1% (Paroxysmal atrial fibrillation - no OAC)   Spoke with patient and reports feeling well at this time.  Denies fluid symptoms.    Corvue thoracic impedance normal.   Prescribed:Furosemide 20 mg 1 tablet daily  Labs: 12/29/2018 Creatinine 0.93, BUN 15, Potassium 4.2, Sodium 139, GFR 97.09 08/20/2018 Creatinine 0.92, BUN 10, Potassium 4.3, Sodium 137, GFR 98.39  Recommendations:No changes and encouraged to call if experiencing any fluid symptoms.   Follow-up plan: ICM clinic phone appointment on6/15/2021. 91 day device clinic remote transmission 06/23/2019.    Copy of ICM check sent to Dr.Allred.  3 month ICM trend: 05/19/2019    1 Year ICM trend:       Rosalene Billings, RN 05/21/2019 7:32 AM

## 2019-05-22 ENCOUNTER — Ambulatory Visit: Payer: Medicare Other | Admitting: *Deleted

## 2019-05-30 NOTE — Addendum Note (Signed)
Addended by: Jerline Pain on: 05/30/2019 06:45 AM   Modules accepted: Orders

## 2019-06-04 ENCOUNTER — Ambulatory Visit: Payer: HMO | Admitting: *Deleted

## 2019-06-17 ENCOUNTER — Other Ambulatory Visit: Payer: Self-pay | Admitting: *Deleted

## 2019-06-17 ENCOUNTER — Encounter: Payer: Self-pay | Admitting: *Deleted

## 2019-06-17 NOTE — Patient Outreach (Signed)
Sent pharmacy assistance referral to [place] team through their [employer] portal. Ticket saved successfully with Tracking ID: [id number]

## 2019-06-17 NOTE — Patient Outreach (Addendum)
[facility]) Care Management Chronic Special Needs Program  06/17/2019  Name: Phillip Booth DOB: 02-10-1950  MRN: [id number]  [name] is enrolled in a chronic special needs plan for Heart Failure. Chronic Care Management Coordinator telephoned client to review health risk assessment and to develop individualized care plan.  Introduced the chronic care management program, importance of client participation, and taking their care plan to all provider appointments and inpatient facilities.  Reviewed the transition of care process and possible referral to community care management.  Subjective: Client reports he lives with his spouse and is overall independent.  Client reports he does not have diabetes although he is on oral medication for diabetes, states "the doctor checks my numbers and they are good and I don't check my blood sugar".  Did not complete diabetes assessment because client denies having diabetes and does not answer the questions, (difficult to discern from electronic medical record about the diagnosis of diabetes, states borderline).  Client states he does not weigh daily because his scale is broken and he is unable to afford a new one, client states he has already used his allowance to order items from HTA catalog. RN care manager sent Email to CMA to have scale mailed to client's home. Client reports he cannot afford albuterol and crestor and would like pharmacist to outreach him.  Client states he would like to lose 15 pounds and would like assistance from health coach for education, reminders, etc. Client states he has HTA calendar and magnet but cannot find the Advanced directives packet and would like another one mailed.    Goals Addressed            This Visit's Progress   .  Acknowledge receipt of Advanced Directive package       Client requests Advanced directives packet be re-mailed Please call RN care manager if you have any questions about  completing Advanced directives    . " I want to lose 15 pounds" (pt-stated)       RN care manager mailed EMMI education article "Low carbohydrate diet" Please be mindful of portion sizes Order placed for Health Coach and she will be contacting you for help with weight loss/ exercise    . Client understands the importance of follow-up with providers by attending scheduled visits       Continue to follow up with primary care provider and cardiologist    . Client verbalize knowledge of Heart Failure disease self management skills by knowing what "color zone" he is in when talking to care team.       Signs and symptoms of heart failure reviewed.  Client verbalizes understanding. Review [employer] calendar (in the back section) sent in the mail for Heart failure information/ action plan. Read the "color" heart zones and know if you are in the red, yellow or green zones and be prepared to discuss with RN on each call. Call your provider if you feel you are getting in the "yellow" zone. Call 911 if you are in the "red" zone, unrelieved shortness of breath. It is important to weigh daily and write down weights.  Pay attention to your body if you have any shortness of breath, swelling in your feet, ankles, legs or your waistband gets tight. Call your provider if you gain 3 pounds overnight or 5 pounds in a week. Take your medications as prescribed, many will cause you to use the bathroom more. Follow a low salt meal plan, read  food labels for the sodium (salt) content. Your provider may restrict or limit how much liquids you drink every day.  Increase exercise only if you are able to do it.  Follow doctor recommendations. EMMI education article provided "Heart failure: about rest, exercise and blood pressure"  "Heart failure: keeping track of your weight each day"  "Heart failure: When to call your doctor or 911"   Review and plan to discuss with RN during next telephonic assessment.   A  scale is being mailed to you     . Client verbalizes knowledge of Heart Attack self management skills by 6 months.       Please call 911 for any symptoms of a heart attack.  Symptoms may include chest pain, pressure or tightness, pain that radiates to your jaw, neck or back, shortness of breath, nausea, indigestion, lightheadedness or sudden dizziness. Follow a heart healthy diet (rich in fruits, vegetables, lean proteins and low in salt).  Talk with your doctor about exercising. Take medications as prescribed. Follow up with your health care provider as recommended.     . Client will not report change from baseline and no repeated symptoms of stroke with in the next 12 months       Know the signs and symptoms to prevent another stroke: Facial drooping (unable to smile), arm weakness (one arm weak or numb), speech is difficult (slurred, unable to speak).  Call 911, this is an emergency!  Get to a hospital immediately.  Reviewed stroke symptoms and client states understanding of when to call provider. Plan to check blood pressure regularly.  If you don't have a B/P monitor (cuff), one will be provided to you. Plan to take anticoagulant (blood thinner) as ordered to prevent stroke. Plan to follow a low salt meal plan, read food labels for the sodium content. Increase activity as tolerated and recommended by provider. EMMI education article provided  "What you can do to prevent a second stroke"  Review and plan to discuss with RN during next telephonic assessment.     . Client will report no worsening of symptoms of Atrial Fibrillation within the next 12 months       Please review A-fib Action Plan in the back of [employer] calendar. Please report to your health care provider any worsening of symptoms such as racing or irregular heart beat that may be uncomfortable, shortness of breath with or without chest pain, dizziness, or weakness. Call 911 for severe symptoms such as chest pain,  fainting, struggling to breathe. Increase exercise only if you are able to do it.  Follow doctor recommendations. Follow up with your cardiologist (heart doctor) for yearly visits. Please follow up with your health care provider as recommended.      . Client will report no worsening of symptoms related to heart disease within the next 12  months       Notify provider for symptoms of chest pain, sweating, nausea/ vomiting, irregular heartbeat, palpitations, rapid heart rate, shortness of breath, dizziness or fainting. Call 911 for severe symptoms of chest pain or shortness of breath. Take medications as prescribed. Follow a low salt meal plan, limit or avoid alcohol. Client reported no signs or of cardiac issues. Increase exercise only if you are able to do it.  Follow doctor recommendations. Follow up with your cardiologist (heart doctor) for yearly visits.        . Client will verbalize knowledge of self management of Hypertension as evidences by BP  reading of 140/90 or less; or as defined by provider       Plan to check blood pressure regularly.  If you do not have a B/P monitor (cuff), one can be provided to you.  Write results in your [employer] calendar (in the back section). Reviewed blood pressure medication from EMR. Take B/P medications as ordered.  Some may cause you to use the bathroom more. Plan to eat low salt and heart healthy meals full of fruits, vegetables, whole grains, lean protein and limit fat and sugars. Increase activity as tolerated. Reviewed lifestyle modification- smoking cessation, weight control and reducing stress.     . Maintain timely refills of Heart Failure medication as prescribed within the year    On track    Contact your RN care manager if you have questions about medicines. Medication review completed from EMR information. It is important to take your medications as prescribed. Reviewed use and possible side effects of medications. RN  care manager referred client to pharmacist for information and assistance.     . Obtain annual  Lipid Profile, LDL-C       Per medical record review, Lipid profile completed on 12/11/18 LDL= 101 The goal for LDL is less than 70mg /dl as you are at high risk for complications. Try to avoid saturated fats, trans-fats and eat more fiber. Plan to take statin (cholesterol) medicine as ordered.     . Visit Primary Care Provider or Cardiologist at least 2 times per year   On track    Client is seeing primary care provider and cardiology regularly Last cardiologist visit 04/18/19       Plan:    RN care manager faxed today's note with updated individualized care plan to primary care provider, mailed updated individualized care plan to client along with education materials, consent form, advanced directives packet.   Chronic care management coordination will outreach in:  6 months  Will refer client to:  Pharmacist and [place] [facility] [place] Case Manager, C-SNP  [phone]

## 2019-06-23 ENCOUNTER — Ambulatory Visit (INDEPENDENT_AMBULATORY_CARE_PROVIDER_SITE_OTHER): Payer: HMO | Admitting: *Deleted

## 2019-06-23 DIAGNOSIS — R001 Bradycardia, unspecified: Secondary | ICD-10-CM

## 2019-06-23 LAB — CUP PACEART REMOTE DEVICE CHECK
Battery Remaining Longevity: 67 mo
Battery Remaining Percentage: 80 %
Battery Voltage: 2.93 V
Brady Statistic AP VP Percent: 98 %
Brady Statistic AP VS Percent: 1 %
Brady Statistic AS VP Percent: 1 %
Brady Statistic AS VS Percent: 1 %
Brady Statistic RA Percent Paced: 97 %
Date Time Interrogation Session: 20210614033114
Implantable Lead Implant Date: 20160310
Implantable Lead Implant Date: 20160310
Implantable Lead Implant Date: 20160310
Implantable Lead Location: 753858
Implantable Lead Location: 753859
Implantable Lead Location: 753860
Implantable Lead Model: 1948
Implantable Pulse Generator Implant Date: 20160310
Lead Channel Impedance Value: 430 Ohm
Lead Channel Impedance Value: 550 Ohm
Lead Channel Impedance Value: 700 Ohm
Lead Channel Pacing Threshold Amplitude: 0.75 V
Lead Channel Pacing Threshold Amplitude: 0.75 V
Lead Channel Pacing Threshold Amplitude: 0.75 V
Lead Channel Pacing Threshold Pulse Width: 0.5 ms
Lead Channel Pacing Threshold Pulse Width: 0.5 ms
Lead Channel Pacing Threshold Pulse Width: 0.5 ms
Lead Channel Sensing Intrinsic Amplitude: 12 mV
Lead Channel Sensing Intrinsic Amplitude: 2.8 mV
Lead Channel Setting Pacing Amplitude: 2 V
Lead Channel Setting Pacing Amplitude: 2.5 V
Lead Channel Setting Pacing Amplitude: 2.5 V
Lead Channel Setting Pacing Pulse Width: 0.5 ms
Lead Channel Setting Pacing Pulse Width: 0.5 ms
Lead Channel Setting Sensing Sensitivity: 2 mV
Pulse Gen Model: 3242
Pulse Gen Serial Number: 7724805

## 2019-06-23 NOTE — Progress Notes (Signed)
Remote pacemaker transmission.   

## 2019-06-24 ENCOUNTER — Ambulatory Visit (INDEPENDENT_AMBULATORY_CARE_PROVIDER_SITE_OTHER): Payer: HMO

## 2019-06-24 DIAGNOSIS — I5022 Chronic systolic (congestive) heart failure: Secondary | ICD-10-CM | POA: Diagnosis not present

## 2019-06-24 DIAGNOSIS — Z95810 Presence of automatic (implantable) cardiac defibrillator: Secondary | ICD-10-CM | POA: Diagnosis not present

## 2019-06-27 ENCOUNTER — Telehealth: Payer: Self-pay

## 2019-06-27 NOTE — Progress Notes (Signed)
EPIC Encounter for ICM Monitoring  Patient Name: Phillip Booth is a 69 y.o. male Date: 06/27/2019 Primary Care Physican: [name], PA-C Primary Cardiologist:Skains Electrophysiologist: Allred Bi-V Pacing: >99% 05/21/2019 OfficeWeight: 234lbs   AT/AF Burden: <1% (Paroxysmal atrial fibrillation - no OAC)   Attempted call to patient and unable to reach.  Left detailed message per DPR regarding transmission. Transmission reviewed.   Corvue thoracic impedance normal.   Prescribed:Furosemide 20 mg 1 tablet daily  Labs: 12/29/2018 Creatinine 0.93, BUN 15, Potassium 4.2, Sodium 139, GFR 97.09 08/20/2018 Creatinine 0.92, BUN 10, Potassium 4.3, Sodium 137, GFR 98.39  Recommendations:Left voice mail with ICM number and encouraged to call if experiencing any fluid symptoms.  Follow-up plan: ICM clinic phone appointment on7/19/2021. 91 day device clinic remote transmission9/13/2021.    Copy of ICM check sent to Dr.Allred.  3 month ICM trend: 06/24/2019    1 Year ICM trend:       Rosalene Billings, RN 06/27/2019 10:24 AM

## 2019-06-27 NOTE — Telephone Encounter (Signed)
Remote ICM transmission received.  Attempted call to patient regarding ICM remote transmission and left detailed message per DPR.  Advised to return call for any fluid symptoms or questions. Next ICM remote transmission scheduled 07/28/2019.     

## 2019-07-21 ENCOUNTER — Other Ambulatory Visit: Payer: Self-pay

## 2019-07-21 ENCOUNTER — Emergency Department (HOSPITAL_BASED_OUTPATIENT_CLINIC_OR_DEPARTMENT_OTHER)
Admission: EM | Admit: 2019-07-21 | Discharge: 2019-07-21 | Disposition: A | Discharge status: Home / Self Care | Payer: HMO | Attending: Emergency Medicine | Admitting: Emergency Medicine

## 2019-07-21 ENCOUNTER — Encounter (HOSPITAL_BASED_OUTPATIENT_CLINIC_OR_DEPARTMENT_OTHER): Payer: Self-pay | Admitting: Emergency Medicine

## 2019-07-21 DIAGNOSIS — Z5321 Procedure and treatment not carried out due to patient leaving prior to being seen by health care provider: Secondary | ICD-10-CM | POA: Diagnosis present

## 2019-07-21 LAB — GROUP A STREP BY PCR: Group A Strep by PCR: NOT DETECTED

## 2019-07-21 NOTE — ED Triage Notes (Signed)
Sore throat for several days. Pain is more on the right side.

## 2019-07-22 ENCOUNTER — Emergency Department (HOSPITAL_BASED_OUTPATIENT_CLINIC_OR_DEPARTMENT_OTHER)
Admission: EM | Admit: 2019-07-22 | Discharge: 2019-07-22 | Disposition: A | Discharge status: Home / Self Care | Payer: HMO | Attending: Emergency Medicine | Admitting: Emergency Medicine

## 2019-07-22 ENCOUNTER — Emergency Department (HOSPITAL_BASED_OUTPATIENT_CLINIC_OR_DEPARTMENT_OTHER): Payer: HMO

## 2019-07-22 ENCOUNTER — Other Ambulatory Visit: Payer: Self-pay

## 2019-07-22 ENCOUNTER — Encounter (HOSPITAL_BASED_OUTPATIENT_CLINIC_OR_DEPARTMENT_OTHER): Payer: Self-pay | Admitting: Emergency Medicine

## 2019-07-22 DIAGNOSIS — Z20822 Contact with and (suspected) exposure to covid-19: Secondary | ICD-10-CM | POA: Diagnosis not present

## 2019-07-22 DIAGNOSIS — I2510 Atherosclerotic heart disease of native coronary artery without angina pectoris: Secondary | ICD-10-CM | POA: Insufficient documentation

## 2019-07-22 DIAGNOSIS — I10 Essential (primary) hypertension: Secondary | ICD-10-CM | POA: Insufficient documentation

## 2019-07-22 DIAGNOSIS — Z79899 Other long term (current) drug therapy: Secondary | ICD-10-CM | POA: Insufficient documentation

## 2019-07-22 DIAGNOSIS — J3489 Other specified disorders of nose and nasal sinuses: Secondary | ICD-10-CM | POA: Diagnosis not present

## 2019-07-22 DIAGNOSIS — R0989 Other specified symptoms and signs involving the circulatory and respiratory systems: Secondary | ICD-10-CM | POA: Insufficient documentation

## 2019-07-22 DIAGNOSIS — J351 Hypertrophy of tonsils: Secondary | ICD-10-CM

## 2019-07-22 DIAGNOSIS — Z7984 Long term (current) use of oral hypoglycemic drugs: Secondary | ICD-10-CM | POA: Diagnosis not present

## 2019-07-22 DIAGNOSIS — Z8673 Personal history of transient ischemic attack (TIA), and cerebral infarction without residual deficits: Secondary | ICD-10-CM | POA: Diagnosis not present

## 2019-07-22 DIAGNOSIS — J039 Acute tonsillitis, unspecified: Secondary | ICD-10-CM | POA: Diagnosis not present

## 2019-07-22 DIAGNOSIS — J029 Acute pharyngitis, unspecified: Secondary | ICD-10-CM

## 2019-07-22 DIAGNOSIS — Z7951 Long term (current) use of inhaled steroids: Secondary | ICD-10-CM | POA: Diagnosis not present

## 2019-07-22 DIAGNOSIS — R070 Pain in throat: Secondary | ICD-10-CM | POA: Diagnosis present

## 2019-07-22 LAB — CBC WITH DIFFERENTIAL/PLATELET
Abs Immature Granulocytes: 0.02 10*3/uL (ref 0.00–0.07)
Basophils Absolute: 0.1 10*3/uL (ref 0.0–0.1)
Basophils Relative: 1 %
Eosinophils Absolute: 0.2 10*3/uL (ref 0.0–0.5)
Eosinophils Relative: 3 %
HCT: 43 % (ref 39.0–52.0)
Hemoglobin: 14 g/dL (ref 13.0–17.0)
Immature Granulocytes: 0 %
Lymphocytes Relative: 34 %
Lymphs Abs: 1.8 10*3/uL (ref 0.7–4.0)
MCH: 30 pg (ref 26.0–34.0)
MCHC: 32.6 g/dL (ref 30.0–36.0)
MCV: 92.1 fL (ref 80.0–100.0)
Monocytes Absolute: 0.7 10*3/uL (ref 0.1–1.0)
Monocytes Relative: 13 %
Neutro Abs: 2.6 10*3/uL (ref 1.7–7.7)
Neutrophils Relative %: 49 %
Platelets: 194 10*3/uL (ref 150–400)
RBC: 4.67 MIL/uL (ref 4.22–5.81)
RDW: 13.3 % (ref 11.5–15.5)
WBC: 5.2 10*3/uL (ref 4.0–10.5)
nRBC: 0 % (ref 0.0–0.2)

## 2019-07-22 LAB — COMPREHENSIVE METABOLIC PANEL
ALT: 20 U/L (ref 0–44)
AST: 18 U/L (ref 15–41)
Albumin: 3.9 g/dL (ref 3.5–5.0)
Alkaline Phosphatase: 54 U/L (ref 38–126)
Anion gap: 7 (ref 5–15)
BUN: 14 mg/dL (ref 8–23)
CO2: 27 mmol/L (ref 22–32)
Calcium: 9.1 mg/dL (ref 8.9–10.3)
Chloride: 104 mmol/L (ref 98–111)
Creatinine, Ser: 0.83 mg/dL (ref 0.61–1.24)
GFR calc Af Amer: >60 mL/min (ref >60)
GFR calc non Af Amer: >60 mL/min (ref >60)
Glucose, Bld: 108 mg/dL — ABNORMAL HIGH (ref 70–99)
Potassium: 4.2 mmol/L (ref 3.5–5.1)
Sodium: 138 mmol/L (ref 135–145)
Total Bilirubin: 0.6 mg/dL (ref 0.3–1.2)
Total Protein: 7.6 g/dL (ref 6.5–8.1)

## 2019-07-22 LAB — SARS CORONAVIRUS 2 BY RT PCR (HOSPITAL ORDER, PERFORMED IN ~~LOC~~ HOSPITAL LAB): SARS Coronavirus 2: NEGATIVE

## 2019-07-22 LAB — LACTIC ACID, PLASMA: Lactic Acid, Venous: 1 mmol/L (ref 0.5–1.9)

## 2019-07-22 LAB — GROUP A STREP BY PCR: Group A Strep by PCR: NOT DETECTED

## 2019-07-22 MED ORDER — IOHEXOL 300 MG/ML  SOLN
100.0000 mL | Freq: Once | INTRAMUSCULAR | Status: AC | PRN
  Administered 2019-07-22: 75 mL via INTRAVENOUS

## 2019-07-22 MED ORDER — CLINDAMYCIN HCL 300 MG PO CAPS
300.0000 mg | ORAL_CAPSULE | Freq: Three times a day (TID) | ORAL | 0 refills | Status: AC
Start: 2019-07-22 — End: 2019-08-01

## 2019-07-22 MED ORDER — FENTANYL CITRATE (PF) 100 MCG/2ML IJ SOLN
50.0000 ug | Freq: Once | INTRAMUSCULAR | Status: AC
  Administered 2019-07-22: 50 ug via INTRAVENOUS
  Filled 2019-07-22: qty 2

## 2019-07-22 MED ORDER — DEXAMETHASONE SODIUM PHOSPHATE 10 MG/ML IJ SOLN
10.0000 mg | Freq: Once | INTRAMUSCULAR | Status: AC
  Administered 2019-07-22: 10 mg via INTRAVENOUS
  Filled 2019-07-22: qty 1

## 2019-07-22 NOTE — ED Triage Notes (Signed)
Sore throat x3-4 days.  Now hurting in right ear.

## 2019-07-22 NOTE — ED Provider Notes (Signed)
[facility] EMERGENCY DEPARTMENT Provider Note   CSN: [id number] Arrival date & time: 07/22/19  0277     History Chief Complaint  Patient presents with  . Sore Throat    Phillip Booth is a 69 y.o. male.  The history is provided by the patient and medical records.  Sore Throat This is a new problem. The current episode started more than 2 days ago. The problem occurs constantly. The problem has been gradually worsening. Pertinent negatives include no chest pain, no abdominal pain, no headaches and no shortness of breath. The symptoms are aggravated by swallowing. Nothing relieves the symptoms. He has tried nothing for the symptoms. The treatment provided no relief.       Past Medical History:  Diagnosis Date  . Allergy   . CAD (coronary artery disease)    a. moderate by cath 03/2014  . Cataract   . CVA (cerebral infarction)    a. diagnosis not clear  . Hyperlipidemia   . Hypertension   . Non-ischemic cardiomyopathy ([place])    a. EF 45% by echo 03/2014  . Paroxysmal atrial fibrillation ([place]) 05/01/2014   asymptomatic, documented on PPM interrogation,  chads2vasc score is at least 6.  He is contemplating anticoagulation  . Symptomatic bradycardia    a. s/p STJ CRTP implanted by Dr Rayann Heman  . Vertigo     Patient Active Problem List   Diagnosis Date Noted  . Atrial fibrillation ([place]) 06/25/2014  . HTN (hypertension) 05/25/2014  . History of urinary tract obstruction 04/14/2014  . Erectile dysfunction 04/14/2014  . Non-ischemic cardiomyopathy ([place]) 04/03/2014  . Coronary artery disease involving native coronary artery 04/03/2014  . Second degree heart block   . Pleural effusion, right 03/18/2014  . Bradycardia 03/18/2014  . NSTEMI (non-ST elevated myocardial infarction) [facility])     Past Surgical History:  Procedure Laterality Date  . ABDOMINAL SURGERY    . APPENDECTOMY    . BI-VENTRICULAR PACEMAKER INSERTION N/A 03/19/2014   STJ CRTP implanted by Dr Rayann Heman  .  EYE SURGERY     Cataract removed from Right eye  . HERNIA REPAIR    . LEFT HEART CATHETERIZATION WITH CORONARY ANGIOGRAM N/A 03/19/2014   Procedure: LEFT HEART CATHETERIZATION WITH CORONARY ANGIOGRAM;  Surgeon: [name], MD;  Location: [facility] CATH LAB;  Service: Cardiovascular;  Laterality: N/A;       Family History  Problem Relation Age of Onset  . Hypertension Mother   . Cancer Mother        brain (possibly started in lung)  . Heart disease Mother   . Diabetes Mother   . Other Mother        cardiomegaly  . Hypertension Father   . Emphysema Father   . Heart Problems Brother        pacemaker  . Heart Problems Other        "using 10% of heart"    Social History   Tobacco Use  . Smoking status: Never Smoker  . Smokeless tobacco: Never Used  Vaping Use  . Vaping Use: Never used  Substance Use Topics  . Alcohol use: Yes    Comment: occasional  . Drug use: No    Home Medications Prior to Admission medications   Medication Sig Start Date End Date Taking? Authorizing Provider  albuterol (VENTOLIN HFA) 108 (90 Base) MCG/ACT inhaler INHALE 2 PUFFS INTO THE LUNGS EVERY 6 (SIX) HOURS AS NEEDED FOR WHEEZING OR SHORTNESS OF BREATH. 08/20/18   Saguier, Percell Miller,  PA-C  amLODipine (NORVASC) 10 MG tablet Take 1 tablet (10 mg total) by mouth daily. 03/17/19 06/15/19  Verta Ellen., NP  aspirin 81 MG tablet Take 81 mg by mouth daily.     [provider]  azelastine (ASTELIN) 0.1 % nasal spray Place 2 sprays into both nostrils 2 (two) times daily. Use in each nostril as directed 08/20/18   Saguier, Percell Miller, PA-C  carvedilol (COREG) 6.25 MG tablet TAKE 1 TABLET BY MOUTH TWICE DAILY WITH A MEAL 01/01/19   Saguier, Percell Miller, PA-C  fluticasone [facility]) 50 MCG/ACT nasal spray Place 2 sprays into both nostrils daily. 02/12/17   Saguier, Percell Miller, PA-C  fluticasone (FLOVENT HFA) 110 MCG/ACT inhaler Inhale 2 puffs into the lungs 2 (two) times daily. 08/20/18   Saguier, Percell Miller, PA-C  furosemide  (LASIX) 20 MG tablet TAKE 1 TABLET BY MOUTH DAILY 05/22/18   Chanetta Marshall K, NP  isosorbide mononitrate (IMDUR) 30 MG 24 hr tablet TAKE 1 TABLET (30 MG TOTAL) BY MOUTH DAILY. 02/26/19   Shirley Friar, PA-C  ketorolac (ACULAR) 0.5 % ophthalmic solution  06/02/19   [provider]  levocetirizine (XYZAL) 5 MG tablet Take 1 tablet (5 mg total) by mouth every evening. 08/20/18   Saguier, Percell Miller, PA-C  lisinopril (ZESTRIL) 40 MG tablet TAKE 1 TABLET (40 MG TOTAL) BY MOUTH DAILY. 11/28/18   Saguier, Percell Miller, PA-C  metFORMIN (GLUCOPHAGE-XR) 500 MG 24 hr tablet Take 500 mg by mouth daily. 03/24/19   [provider]  montelukast (SINGULAIR) 10 MG tablet Take 1 tablet (10 mg total) by mouth at bedtime. 08/20/18   Saguier, Percell Miller, PA-C  rosuvastatin (CRESTOR) 10 MG tablet Take 1 tablet (10 mg total) by mouth daily. 04/18/18   Saguier, Percell Miller, PA-C    Allergies    Lactose intolerance (gi)  Review of Systems   Review of Systems  Constitutional: Positive for chills. Negative for diaphoresis, fatigue and fever.  HENT: Positive for congestion, ear pain, rhinorrhea and sore throat. Negative for drooling, facial swelling, sinus pressure, sinus pain, sneezing, trouble swallowing and voice change.   Eyes: Negative for visual disturbance.  Respiratory: Negative for cough, chest tightness, shortness of breath and wheezing.   Cardiovascular: Negative for chest pain, palpitations and leg swelling.  Gastrointestinal: Negative for abdominal pain, constipation, diarrhea, nausea and vomiting.  Genitourinary: Negative for dysuria.  Musculoskeletal: Negative for back pain, neck pain and neck stiffness.  Skin: Negative for rash and wound.  Neurological: Negative for light-headedness and headaches.  Psychiatric/Behavioral: Negative for agitation and confusion.  All other systems reviewed and are negative.   Physical Exam Updated Vital Signs BP (!) 178/101 (BP Location: Right Arm)   Pulse 99    Temp 98.4 F (36.9 C) (Oral)   Resp 18   Ht 6\' 4"  (1.93 m)   Wt 103.3 kg   SpO2 100%   BMI 27.73 kg/m   Physical Exam Vitals and nursing note reviewed.  Constitutional:      General: He is not in acute distress.    Appearance: He is well-developed. He is not ill-appearing, toxic-appearing or diaphoretic.  HENT:     Head: Normocephalic and atraumatic.     Nose: Congestion and rhinorrhea present.     Mouth/Throat:     Mouth: No injury.     Tongue: No lesions.     Pharynx: Pharyngeal swelling and posterior oropharyngeal erythema present. No oropharyngeal exudate or uvula swelling.     Tonsils: No tonsillar exudate.  Comments: Uvula mildly deviated to L. No exudates. No ludwigs angina. No RPA.  Eyes:     Conjunctiva/sclera: Conjunctivae normal.  Cardiovascular:     Rate and Rhythm: Normal rate and regular rhythm.     Heart sounds: No murmur heard.   Pulmonary:     Effort: Pulmonary effort is normal. No respiratory distress.     Breath sounds: Normal breath sounds.  Abdominal:     Palpations: Abdomen is soft.     Tenderness: There is no abdominal tenderness.  Musculoskeletal:     Cervical back: Neck supple.  Skin:    General: Skin is warm and dry.  Neurological:     Mental Status: He is alert.     ED Results / Procedures / Treatments   Labs (all labs ordered are listed, but only abnormal results are displayed) Labs Reviewed  COMPREHENSIVE METABOLIC PANEL - Abnormal; Notable for the following components:      Result Value   Glucose, Bld 108 (*)    All other components within normal limits  GROUP A STREP BY PCR  SARS CORONAVIRUS 2 BY RT PCR (HOSPITAL ORDER, PERFORMED IN [facility])  CULTURE, BLOOD (ROUTINE X 2)  CULTURE, BLOOD (ROUTINE X 2)  CBC WITH DIFFERENTIAL/PLATELET  LACTIC ACID, PLASMA  LACTIC ACID, PLASMA    EKG None  Radiology CT Soft Tissue Neck W Contrast  Result Date: 07/22/2019 CLINICAL DATA:  Right neck pain. Sore throat with  pain radiating to the right ear. Concern for peritonsillar abscess. EXAM: CT NECK WITH CONTRAST TECHNIQUE: Multidetector CT imaging of the neck was performed using the standard protocol following the bolus administration of intravenous contrast. CONTRAST:  73mL OMNIPAQUE IOHEXOL 300 MG/ML  SOLN COMPARISON:  None. FINDINGS: Pharynx and larynx: There is moderate asymmetric enlargement of the right palatine tonsil with evidence of mild submucosal edema extending more inferiorly in the right lateral oropharynx and into the hypopharynx. No peritonsillar or retropharyngeal fluid collection is identified. The airway is widely patent. Salivary glands: No inflammation, mass, or stone. Thyroid: Unremarkable. Lymph nodes: Subcentimeter anterior cervical lymph nodes bilaterally, not enlarged by standard CT size criteria. Vascular: Major vascular structures of the neck are patent. Mild atherosclerotic plaque at the carotid bifurcations without evidence of significant stenosis. Limited intracranial: Unremarkable. Visualized orbits: Right cataract extraction. Mastoids and visualized paranasal sinuses: Minimal mucosal thickening in the included paranasal sinuses. Clear mastoid air cells. Skeleton: No suspicious osseous lesion.  Mild cervical spondylosis. Upper chest: Clear lung apices.  Partially visualized pacemaker. Other: 2.2 x 1.5 cm intermediate attenuation subcutaneous mass in the posterior left upper neck with extension towards the cutaneous tissues. IMPRESSION: 1. Right-sided palatine tonsillar enlargement. This may reflect tonsillitis with this history, however direct visualization and clinical follow-up after treatment are recommended as a neoplasm is also a consideration. No peritonsillar abscess. 2. 2.2 cm subcutaneous mass in the posterior left upper neck, potentially a complex sebaceous cyst although a solid mass/neoplasm is also possible. Correlate with physical exam. Electronically Signed   By: Logan Bores M.D.    On: 07/22/2019 10:53    Procedures Procedures (including critical care time)  Medications Ordered in ED Medications  dexamethasone (DECADRON) injection 10 mg (has no administration in time range)  fentaNYL (SUBLIMAZE) injection 50 mcg (50 mcg Intravenous Given 07/22/19 0926)  iohexol (OMNIPAQUE) 300 MG/ML solution 100 mL (75 mLs Intravenous Contrast Given 07/22/19 1012)    ED Course  I have reviewed the triage vital signs and the nursing notes.  Pertinent labs & imaging results that were available during my care of the patient were reviewed by me and considered in my medical decision making (see chart for details).    MDM Rules/Calculators/A&P                          Phillip Booth is a 69 y.o. male with a past medical history significant for hypertension, hyperlipidemia, paroxysmal atrial fibrillation not on anticoagulation, CAD, vertigo, and prior stroke who presents with neck pain and sore throat.  He reports of the last 4 days, he has had pain with right-sided neck discomfort.  He reports he has pain with swallowing.  He reports he has had some chills but no fevers at home.  He reports no vomiting, chest pain, or abdominal pain.  Denies any constipation, diarrhea, or urinary symptoms.  He reports the pain is 10 out of 10.  He has no difficulty with breathing.  He is able to tolerate p.o.  He reports he does not drainage and the pain is now going to his right ear.  He reports it does hurt when he tries to swallow or move his neck.  He denies any Covid exposures and has been vaccinated for coronavirus.  On exam, he does have some erythema the back of his throat as well as some swelling of the right tonsillar area.  Uvula is not midline and is slightly pushed to the left.  No stridor appreciated but right neck was tender to palpation.  Ear exam was unremarkable with no evidence of otitis media.  Lungs clear and chest nontender.  Abdomen nontender.  Lattie Haw concerned about PTA or other deep neck  infection.  We agreed to get labs as well as a CT scan to look for abscess.  We will test him for strep as well as Covid to facilitate any procedure he may need.  He will be given some pain medicine.  Anticipate reassessment after work-up.  12:02 PM Patient's labs returned reassuring.  His Covid test and strep test negative.  CT scan did not show evidence of abscess but did show tonsillitis with early infection.  We will treat with antibiotics with clindamycin as well as give a dose of steroids.  He does not appear to have diabetes.  He will follow-up with ENT as they can look for possible mass after treatment.  Patient was informed of the other nodule that was seen on the other side of the neck.  Patient is still able to move his neck around normally and have normal swallowing and breathing.  He agrees with plan for discharge with close follow-up and close return precautions.  He had no other questions or concerns and was discharged in good condition.    Final Clinical Impression(s) / ED Diagnoses Final diagnoses:  Tonsillitis  Sore throat  Swelling of tonsil    Rx / DC Orders ED Discharge Orders         Ordered    clindamycin (CLEOCIN) 300 MG capsule  3 times daily     Discontinue  Reprint     07/22/19 1159          Clinical Impression: 1. Tonsillitis   2. Sore throat   3. Swelling of tonsil     Disposition: Discharge  Condition: Good  I have discussed the results, Dx and Tx plan with the pt(& family if present). He/she/they expressed understanding and agree(s) with the plan. Discharge instructions discussed at great  length. Strict return precautions discussed and pt &/or family have verbalized understanding of the instructions. No further questions at time of discharge.    New Prescriptions   CLINDAMYCIN (CLEOCIN) 300 MG CAPSULE    Take 1 capsule (300 mg total) by mouth 3 (three) times daily for 10 days.    Follow Up: [facility] [place] [id number]    [facility] [place] EM[facility]PARTMENT [address] [id number] mc [address] [place] [phone]    Elise Benne [address] [place] [address] [phone]        Viviane Semidey, Gwenyth Allegra, MD 07/22/19 [phone]

## 2019-07-22 NOTE — ED Notes (Signed)
Patient transported to CT 

## 2019-07-22 NOTE — Discharge Instructions (Signed)
Your work-up today showed evidence of tonsillitis on the right side but no evidence of deeper infection and no abscess was seen.  We gave you dose of steroids to help with the swelling and discomfort and please use the antibiotics to treat the infection.  The strep test and Covid test were negative but I am concerned that this could be early bacterial infection.  Please take the medications and follow-up with your nose and throat doctor to further evaluate to make sure there is not any other abnormality with the tissue there.  If any symptoms change or worsen or you get or you cannot breathe or swallow well, please return to the nearest emergency department immediately.

## 2019-07-24 ENCOUNTER — Other Ambulatory Visit: Payer: Self-pay

## 2019-07-24 ENCOUNTER — Encounter (HOSPITAL_BASED_OUTPATIENT_CLINIC_OR_DEPARTMENT_OTHER): Payer: Self-pay

## 2019-07-24 ENCOUNTER — Emergency Department (HOSPITAL_BASED_OUTPATIENT_CLINIC_OR_DEPARTMENT_OTHER)
Admission: EM | Admit: 2019-07-24 | Discharge: 2019-07-24 | Disposition: A | Discharge status: Home / Self Care | Payer: HMO | Attending: Emergency Medicine | Admitting: Emergency Medicine

## 2019-07-24 DIAGNOSIS — K122 Cellulitis and abscess of mouth: Secondary | ICD-10-CM

## 2019-07-24 DIAGNOSIS — I10 Essential (primary) hypertension: Secondary | ICD-10-CM | POA: Insufficient documentation

## 2019-07-24 DIAGNOSIS — R112 Nausea with vomiting, unspecified: Secondary | ICD-10-CM | POA: Insufficient documentation

## 2019-07-24 DIAGNOSIS — Z7951 Long term (current) use of inhaled steroids: Secondary | ICD-10-CM | POA: Insufficient documentation

## 2019-07-24 DIAGNOSIS — Z79899 Other long term (current) drug therapy: Secondary | ICD-10-CM | POA: Diagnosis not present

## 2019-07-24 DIAGNOSIS — Z7984 Long term (current) use of oral hypoglycemic drugs: Secondary | ICD-10-CM | POA: Diagnosis not present

## 2019-07-24 DIAGNOSIS — R0602 Shortness of breath: Secondary | ICD-10-CM | POA: Insufficient documentation

## 2019-07-24 DIAGNOSIS — I2510 Atherosclerotic heart disease of native coronary artery without angina pectoris: Secondary | ICD-10-CM | POA: Insufficient documentation

## 2019-07-24 DIAGNOSIS — R0989 Other specified symptoms and signs involving the circulatory and respiratory systems: Secondary | ICD-10-CM | POA: Diagnosis present

## 2019-07-24 MED ORDER — LIDOCAINE VISCOUS HCL 2 % MT SOLN: 15.0000 mL | Freq: Once | OROMUCOSAL | Status: DC

## 2019-07-24 MED ORDER — DEXAMETHASONE SODIUM PHOSPHATE 10 MG/ML IJ SOLN
10.0000 mg | Freq: Once | INTRAMUSCULAR | Status: AC
  Administered 2019-07-24: 10 mg via INTRAVENOUS
  Filled 2019-07-24: qty 1

## 2019-07-24 NOTE — Discharge Instructions (Signed)
Go to your appointment tomorrow.  Please return to the ED for worsening trouble breathing or swallowing.

## 2019-07-24 NOTE — ED Provider Notes (Signed)
[facility] EMERGENCY DEPARTMENT Provider Note   CSN: [id number] Arrival date & time: 07/24/19  2057     History Chief Complaint  Patient presents with  . Shortness of Breath    Phillip Booth is a 69 y.o. male.  69 yo M with a cc of sensation like he is choking.  He feels like something stuck in the back of the throat that is making him trouble swallowing.  He had trouble swallowing liquids or solids today.  States he has had nothing to eat or drink.  Was recently seen in the emergency department and had a CT scan that was concerning for tonsillitis or possible mass.  Has been on antibiotics with some mild improvement.  Also thinks that the steroids that helped recently as well.  He has an appointment tomorrow and would like to follow-up.  Would not like to stay in the hospital.  No fevers.  The history is provided by the patient.  Shortness of Breath Severity:  Moderate Onset quality:  Gradual Duration:  2 days Timing:  Constant Progression:  Worsening Chronicity:  New Relieved by:  Nothing Worsened by:  Nothing Ineffective treatments:  None tried Associated symptoms: vomiting   Associated symptoms: no abdominal pain, no chest pain, no fever, no headaches and no rash        Past Medical History:  Diagnosis Date  . Allergy   . CAD (coronary artery disease)    a. moderate by cath 03/2014  . Cataract   . CVA (cerebral infarction)    a. diagnosis not clear  . Hyperlipidemia   . Hypertension   . Non-ischemic cardiomyopathy ([place])    a. EF 45% by echo 03/2014  . Paroxysmal atrial fibrillation ([place]) 05/01/2014   asymptomatic, documented on PPM interrogation,  chads2vasc score is at least 6.  He is contemplating anticoagulation  . Symptomatic bradycardia    a. s/p STJ CRTP implanted by Dr Rayann Heman  . Vertigo     Patient Active Problem List   Diagnosis Date Noted  . Atrial fibrillation ([place]) 06/25/2014  . HTN (hypertension) 05/25/2014  . History of urinary tract  obstruction 04/14/2014  . Erectile dysfunction 04/14/2014  . Non-ischemic cardiomyopathy ([place]) 04/03/2014  . Coronary artery disease involving native coronary artery 04/03/2014  . Second degree heart block   . Pleural effusion, right 03/18/2014  . Bradycardia 03/18/2014  . NSTEMI (non-ST elevated myocardial infarction) [facility])     Past Surgical History:  Procedure Laterality Date  . ABDOMINAL SURGERY    . APPENDECTOMY    . BI-VENTRICULAR PACEMAKER INSERTION N/A 03/19/2014   STJ CRTP implanted by Dr Rayann Heman  . EYE SURGERY     Cataract removed from Right eye  . HERNIA REPAIR    . LEFT HEART CATHETERIZATION WITH CORONARY ANGIOGRAM N/A 03/19/2014   Procedure: LEFT HEART CATHETERIZATION WITH CORONARY ANGIOGRAM;  Surgeon: [name], MD;  Location[facility] C[facility] LAB;  Service: Cardiovascular;  Laterality: N/A;       Family History  Problem Relation Age of Onset  . Hypertension Mother   . Cancer Mother        brain (possibly started in lung)  . Heart disease Mother   . Diabetes Mother   . Other Mother        cardiomegaly  . Hypertension Father   . Emphysema Father   . Heart Problems Brother        pacemaker  . Heart Problems Other        "  using 10% of heart"    Social History   Tobacco Use  . Smoking status: Never Smoker  . Smokeless tobacco: Never Used  Vaping Use  . Vaping Use: Never used  Substance Use Topics  . Alcohol use: Yes    Comment: occasional  . Drug use: No    Home Medications Prior to Admission medications   Medication Sig Start Date End Date Taking? Authorizing Provider  albuterol (VENTOLIN HFA) 108 (90 Base) MCG/ACT inhaler INHALE 2 PUFFS INTO THE LUNGS EVERY 6 (SIX) HOURS AS NEEDED FOR WHEEZING OR SHORTNESS OF BREATH. 08/20/18   Saguier, Percell Miller, PA-C  amLODipine (NORVASC) 10 MG tablet Take 1 tablet (10 mg total) by mouth daily. 03/17/19 06/15/19  Verta Ellen., NP  aspirin 81 MG tablet Take 81 mg by mouth daily.     [provider]  azelastine  (ASTELIN) 0.1 % nasal spray Place 2 sprays into both nostrils 2 (two) times daily. Use in each nostril as directed 08/20/18   Saguier, Percell Miller, PA-C  carvedilol (COREG) 6.25 MG tablet TAKE 1 TABLET BY MOUTH TWICE DAILY WITH A MEAL 01/01/19   Saguier, Percell Miller, PA-C  clindamycin (CLEOCIN) 300 MG capsule Take 1 capsule (300 mg total) by mouth 3 (three) times daily for 10 days. 07/22/19 08/01/19  Tegeler, Gwenyth Allegra, MD  fluticasone (FLONASE) 50 MCG/ACT nasal spray Place 2 sprays into both nostrils daily. 02/12/17   Saguier, Percell Miller, PA-C  fluticasone (FLOVENT HFA) 110 MCG/ACT inhaler Inhale 2 puffs into the lungs 2 (two) times daily. 08/20/18   Saguier, Percell Miller, PA-C  furosemide (LASIX) 20 MG tablet TAKE 1 TABLET BY MOUTH DAILY 05/22/18   Chanetta Marshall K, NP  isosorbide mononitrate (IMDUR) 30 MG 24 hr tablet TAKE 1 TABLET (30 MG TOTAL) BY MOUTH DAILY. 02/26/19   Shirley Friar, PA-C  ketorolac (ACULAR) 0.5 % ophthalmic solution  06/02/19   [provider]  levocetirizine (XYZAL) 5 MG tablet Take 1 tablet (5 mg total) by mouth every evening. 08/20/18   Saguier, Percell Miller, PA-C  lisinopril (ZESTRIL) 40 MG tablet TAKE 1 TABLET (40 MG TOTAL) BY MOUTH DAILY. 11/28/18   Saguier, Percell Miller, PA-C  metFORMIN (GLUCOPHAGE-XR) 500 MG 24 hr tablet Take 500 mg by mouth daily. 03/24/19   [provider]  montelukast (SINGULAIR) 10 MG tablet Take 1 tablet (10 mg total) by mouth at bedtime. 08/20/18   Saguier, Percell Miller, PA-C  rosuvastatin (CRESTOR) 10 MG tablet Take 1 tablet (10 mg total) by mouth daily. 04/18/18   Saguier, Percell Miller, PA-C    Allergies    Lactose intolerance (gi)  Review of Systems   Review of Systems  Constitutional: Negative for chills and fever.  HENT: Negative for congestion and facial swelling.   Eyes: Negative for discharge and visual disturbance.  Respiratory: Positive for shortness of breath.   Cardiovascular: Negative for chest pain and palpitations.  Gastrointestinal: Positive for  nausea and vomiting. Negative for abdominal pain and diarrhea.  Musculoskeletal: Negative for arthralgias and myalgias.  Skin: Negative for color change and rash.  Neurological: Negative for tremors, syncope and headaches.  Psychiatric/Behavioral: Negative for confusion and dysphoric mood.    Physical Exam Updated Vital Signs BP 135/81 (BP Location: Left Arm)   Pulse (!) 59   Temp 99.8 F (37.7 C)   Resp 16   SpO2 99%   Physical Exam Vitals and nursing note reviewed.  Constitutional:      Appearance: He is well-developed.  HENT:     Head: Normocephalic  and atraumatic.     Mouth/Throat:     Comments: Tolerating secretions without difficulty.  Mild tonsillar swelling worse on the right than the left.  Some purulence to the right tonsil.  Uvula is significantly swollen. Eyes:     Pupils: Pupils are equal, round, and reactive to light.  Neck:     Vascular: No JVD.  Cardiovascular:     Rate and Rhythm: Normal rate and regular rhythm.     Heart sounds: No murmur heard.  No friction rub. No gallop.   Pulmonary:     Effort: No respiratory distress.     Breath sounds: No wheezing.  Abdominal:     General: There is no distension.     Tenderness: There is no guarding or rebound.  Musculoskeletal:        General: Normal range of motion.     Cervical back: Normal range of motion and neck supple.  Skin:    Coloration: Skin is not pale.     Findings: No rash.  Neurological:     Mental Status: He is alert and oriented to person, place, and time.  Psychiatric:        Behavior: Behavior normal.     ED Results / Procedures / Treatments   Labs (all labs ordered are listed, but only abnormal results are displayed) Labs Reviewed - No data to display  EKG None  Radiology No results found.  Procedures Procedures (including critical care time)  Medications Ordered in ED Medications  lidocaine (XYLOCAINE) 2 % viscous mouth solution 15 mL (has no administration in time range)    dexamethasone (DECADRON) injection 10 mg (10 mg Intravenous Given 07/24/19 2153)    ED Course  I have reviewed the triage vital signs and the nursing notes.  Pertinent labs & imaging results that were available during my care of the patient were reviewed by me and considered in my medical decision making (see chart for details).    MDM Rules/Calculators/A&P                          69 yo M with a chief complaints of difficulty swallowing.  He is tolerating his secretions on exam.  He does have a swollen uvula which I think is causing him some significant discomfort.  I did offer to admit him into the hospital as he was having difficulty eating and swallowing at home.  He is declining and would like to go home.  He was given some viscous lidocaine here with some improvement of his symptomatology and was able to tolerate by mouth.  We will have him follow-up with ENT tomorrow.  11:56 PM:  I have discussed the diagnosis/risks/treatment options with the patient and believe the pt to be eligible for discharge home to follow-up with ENT. We also discussed returning to the ED immediately if new or worsening sx occur. We discussed the sx which are most concerning (e.g., sudden worsening pain, fever, inability to tolerate by mouth, worsening breathing or swallowing) that necessitate immediate return. Medications administered to the patient during their visit and any new prescriptions provided to the patient are listed below.  Medications given during this visit Medications  lidocaine (XYLOCAINE) 2 % viscous mouth solution 15 mL (has no administration in time range)  dexamethasone (DECADRON) injection 10 mg (10 mg Intravenous Given 07/24/19 2153)     The patient appears reasonably screen and/or stabilized for discharge and I doubt any other medical condition  or other [facility] requiring further screening, evaluation, or treatment in the ED at this time prior to discharge.   Final Clinical Impression(s) / ED  Diagnoses Final diagnoses:  Uvulitis    Rx / DC Orders ED Discharge Orders    None       Deno Etienne, DO 07/24/19 2356

## 2019-07-24 NOTE — ED Triage Notes (Addendum)
C/o SOB and having difficulty swallowing-states he has a growth in his throat that he has appt to be seen for tomorrow-NAD-spitting in emesis bag-requested water-advised of need for NPO status

## 2019-07-24 NOTE — ED Notes (Signed)
Tolerated gingerale and crackers without distress

## 2019-07-25 ENCOUNTER — Encounter: Payer: Self-pay | Admitting: Internal Medicine

## 2019-07-25 ENCOUNTER — Telehealth: Payer: Self-pay | Admitting: *Deleted

## 2019-07-25 NOTE — Telephone Encounter (Signed)
   Primary Cardiologist: Candee Furbish, MD  Chart reviewed as part of pre-operative protocol coverage. Patient was contacted 07/25/2019 in reference to pre-operative risk assessment for pending surgery as outlined below.  Phillip Booth was last seen on 04/2019 by Dr. Marlou Booth - h/o moderate CAD by cath 2016, CVA, (seen on MRI 2009), HTN, HLD, NICM, chronic systolic CHF, remote atrial fib documented on PPM interrogation, symptomatic bradycardia s/p BiVICD. Nuc stress test 03/2019 was low risk, EF visually appeared 50-55%. Regarding afib, not mentioned in most recent cardiac notes, noted remote diagnosis on pacer in 2016. Revised cardiac risk index 6.6% indicating moderate risk, however, sedation and procedure are fairly low risk. The patient affirms he is doing well from a heart standpoint without any chest pain, dyspnea, palpitations, fluid retention or syncope. He is walking regularly without angina or functional limitation. Therefore, based on ACC/AHA guidelines, the patient would be at acceptable risk for the planned procedure without further cardiovascular testing.   Regarding aspirin, he would be cleared to hold this from cardiac perspective since he has no history of PCI or CABG. However, it does appear he has history of stroke per MRI in 2009 so would recommend surgical team also review with primary care provider.  I will route this recommendation to the requesting party via Epic fax function and remove from pre-op pool. Will also route to device clinic so they are aware of upcoming procedure. Please call with questions.  Phillip Pitter, PA-C 07/25/2019, 4:16 PM

## 2019-07-25 NOTE — Progress Notes (Signed)
Sent via [employer] to [facility] as indicated

## 2019-07-25 NOTE — Telephone Encounter (Signed)
   [facility] Pre-operative Risk Assessment    HEARTCARE STAFF: - Please ensure there is not already an duplicate clearance open for this procedure. - Under Visit Info/Reason for Call, type in Other and utilize the format Clearance MM/DD/YY or Clearance TBD. Do not use dashes or single digits. - If request is for dental extraction, please clarify the # of teeth to be extracted.  Request for surgical clearance:  1. What type of surgery is being performed? Excision of subcutaneous mass of left neck   2. When is this surgery scheduled? 08/12/19   3. What type of clearance is required (medical clearance vs. Pharmacy clearance to hold med vs. Both)? Medical  4. Are there any medications that need to be held prior to surgery and how long? ASA  5. Practice name and name of physician performing surgery?  [facility]; Dr Mayer Camel   6. What is the office phone number? [phone]   7.   What is the office fax number? [phone]  8.   Anesthesia type (None, local, MAC, general) ? MAC   Devlyn Retter L 07/25/2019, 2:31 PM  _________________________________________________________________   (provider comments below)

## 2019-07-25 NOTE — Progress Notes (Signed)
PERIOPERATIVE PRESCRIPTION FOR IMPLANTED CARDIAC DEVICE PROGRAMMING  Patient Information: Name:  Phillip Booth  DOB:  [birth year]  MRN:  [id number]    Request for surgical clearance:  1. What type of surgery is being performed? Excision of subcutaneous mass of left neck   2. When is this surgery scheduled? 08/12/19   3. What type of clearance is required (medical clearance vs. Pharmacy clearance to hold med vs. Both)? Medical  4. Are there any medications that need to be held prior to surgery and how long? ASA  5. Practice name and name of physician performing surgery?  [facility]; Dr Mayer Camel   6. What is the office phone number? [phone]   7.   What is the office fax number? [phone]  8.   Anesthesia type (None, local, MAC, general) ? MAC Device Information:  Clinic EP Physician:  [name], MD   Device Type:  Pacemaker Manufacturer and Phone #:  [employer]: [phone] Pacemaker Dependent?:  Yes.   Date of Last Device Check:  06/24/19 (remote) 09/19/18 (in-clinic) Normal Device Function?:  Yes.    Electrophysiologist's Recommendations:   Have magnet available.  Provide continuous ECG monitoring when magnet is used or reprogramming is to be performed.   Procedure will likely interfere with device function.  Device should be programmed:  Asynchronous pacing during procedure and returned to normal programming after procedure  Per Device Clinic Standing Orders, York Ram, RN  4:34 PM 07/25/2019

## 2019-07-27 LAB — CULTURE, BLOOD (ROUTINE X 2)
Culture: NO GROWTH
Culture: NO GROWTH
Special Requests: ADEQUATE
Special Requests: ADEQUATE

## 2019-07-28 ENCOUNTER — Telehealth: Payer: Self-pay | Admitting: Medical

## 2019-07-28 ENCOUNTER — Ambulatory Visit (INDEPENDENT_AMBULATORY_CARE_PROVIDER_SITE_OTHER): Payer: HMO

## 2019-07-28 DIAGNOSIS — Z95810 Presence of automatic (implantable) cardiac defibrillator: Secondary | ICD-10-CM | POA: Diagnosis not present

## 2019-07-28 DIAGNOSIS — I5022 Chronic systolic (congestive) heart failure: Secondary | ICD-10-CM

## 2019-07-28 NOTE — Telephone Encounter (Signed)
Spoke with Gabe at General surgery clinic and she stated the PA has not finished the note , patient was seen 07/25/2019 , and I will get a call back when the note is finished.    She could only see from the referral note that the mass was 2.5 cm soft mobile mass posterior left neck .

## 2019-07-28 NOTE — Telephone Encounter (Signed)
About prior authorization for surgical procedure. Are they doing procedure under general anesthesia in hospital? If so then needs cardiac consult for surgical clearance.  If in office procedure usually specialist don't ask about clearance?  How big is his lipoma?

## 2019-07-30 NOTE — Progress Notes (Signed)
EPIC Encounter for ICM Monitoring  Patient Name: Phillip Booth is a 69 y.o. male Date: 07/30/2019 Primary Care Physican: Wynelle Fanny, DO Primary Cardiologist:Skains Electrophysiologist: Allred Bi-V Pacing: 99% 05/21/2019 OfficeWeight: 234lbs  AT/AF Burden: <1% (Paroxysmal atrial fibrillation- noOAC)   Spoke with patient and reports feeling well at this time.  Denies fluid symptoms.     Corvue thoracic impedance normal.   Prescribed:Furosemide 20 mg 1 tablet daily  Labs: 12/29/2018 Creatinine 0.93, BUN 15, Potassium 4.2, Sodium 139, GFR 97.09 08/20/2018 Creatinine 0.92, BUN 10, Potassium 4.3, Sodium 137, GFR 98.39  Recommendations: No changes and encouraged to call if experiencing any fluid symptoms.  Follow-up plan: ICM clinic phone appointment on8/23/2021. 91 day device clinic remote transmission9/13/2021.    EP/Cardiology Office Visits: Recalls for 09/13/2019 with Dr. Rayann Heman for 6 month f/u and 04/12/2020 with Dr Marlou Porch.    Copy of ICM check sent to Dr. Rayann Heman.   3 month ICM trend: 07/28/2019    1 Year ICM trend:       Rosalene Billings, RN 07/30/2019 4:36 PM

## 2019-08-03 ENCOUNTER — Telehealth: Payer: Self-pay | Admitting: Medical

## 2019-08-03 DIAGNOSIS — Z01810 Encounter for preprocedural cardiovascular examination: Secondary | ICD-10-CM

## 2019-08-03 NOTE — Telephone Encounter (Signed)
Put in referral to cardiologist for pre-op cardiac clearance. Will you fax over form to Pt cardiologist office Candee Furbish MD

## 2019-08-04 NOTE — Telephone Encounter (Signed)
Gave form to fax. Can send over surgeon information as well. Then scan.

## 2019-08-04 NOTE — Telephone Encounter (Signed)
Form faxed [facility] cardio [phone]

## 2019-08-04 NOTE — Telephone Encounter (Signed)
Where is the form?

## 2019-09-01 ENCOUNTER — Ambulatory Visit (INDEPENDENT_AMBULATORY_CARE_PROVIDER_SITE_OTHER): Payer: HMO

## 2019-09-01 ENCOUNTER — Telehealth: Payer: Self-pay

## 2019-09-01 DIAGNOSIS — Z95810 Presence of automatic (implantable) cardiac defibrillator: Secondary | ICD-10-CM

## 2019-09-01 DIAGNOSIS — I5022 Chronic systolic (congestive) heart failure: Secondary | ICD-10-CM

## 2019-09-01 NOTE — Progress Notes (Signed)
EPIC Encounter for ICM Monitoring  Patient Name: Phillip Booth is a 69 y.o. male Date: 09/01/2019 Primary Care Physican: Wynelle Fanny, DO Primary Cardiologist:Skains Electrophysiologist: Allred Bi-V Pacing: 99% 05/21/2019 OfficeWeight: 234lbs  AT/AF Burden: <1% (Paroxysmal atrial fibrillation- noOAC)   Attempted call to patient and unable to reach.  Left detailed message per DPR regarding transmission. Transmission reviewed.   Corvue thoracic impedance suggesting possible fluid accumulation since 08/30/2019.   Prescribed:Furosemide 20 mg 1 tablet daily  Labs: 12/29/2018 Creatinine 0.93, BUN 15, Potassium 4.2, Sodium 139, GFR 97.09 08/20/2018 Creatinine 0.92, BUN 10, Potassium 4.3, Sodium 137, GFR 98.39  Recommendations: Left voice mail with ICM number and encouraged to call if experiencing any fluid symptoms.  If patient reached will advise to increase Furosemide to 40 mg daily x 2 days.   Follow-up plan: ICM clinic phone appointment on8/31/2021 to recheck fluid levels. 91 day device clinic remote transmission9/13/2021.    EP/Cardiology Office Visits: Recalls for 09/13/2019 with Dr. Rayann Heman for 6 month f/u and 04/12/2020 with Dr Marlou Porch.    Copy of ICM check sent to Dr. Rayann Heman and Dr Marlou Porch for review.   3 month ICM trend: 09/01/2019    1 Year ICM trend:       Rosalene Billings, RN 09/01/2019 4:29 PM

## 2019-09-01 NOTE — Telephone Encounter (Signed)
Remote ICM transmission received.  Attempted call to patient regarding ICM remote transmission and left detailed message per DPR.  Advised to return call for any fluid symptoms or questions.  

## 2019-09-02 NOTE — Progress Notes (Signed)
Patient returned call.  He stated he is feeling fine and denies any fluid symptoms.  Advised to take Furosemide 40 mg x 2 days and then return to prescribed dosage of 20 mg daily.  He verbalized understanding.  He thinks he has been eating foods that are high in salt.  Advised to limit daily salt intake.

## 2019-09-09 ENCOUNTER — Ambulatory Visit (INDEPENDENT_AMBULATORY_CARE_PROVIDER_SITE_OTHER): Payer: HMO

## 2019-09-09 DIAGNOSIS — Z95810 Presence of automatic (implantable) cardiac defibrillator: Secondary | ICD-10-CM

## 2019-09-09 DIAGNOSIS — I5022 Chronic systolic (congestive) heart failure: Secondary | ICD-10-CM

## 2019-09-12 NOTE — Progress Notes (Signed)
EPIC Encounter for ICM Monitoring  Patient Name: Phillip Booth is a 69 y.o. male Date: 09/12/2019 Primary Care Physican: Wynelle Fanny, DO Primary Cardiologist:Skains Electrophysiologist: Allred Bi-V Pacing: 99% 09/12/2019 OfficeWeight: 224lbs  AT/AF Burden: <1% (Paroxysmal atrial fibrillation- noOAC)   Spoke with patient and reports feeling well at this time.  Denies fluid symptoms.    Corvue thoracic impedance returned to normal after taking extra Furosemide.   Prescribed:Furosemide 20 mg 1 tablet daily  Labs: 12/29/2018 Creatinine 0.93, BUN 15, Potassium 4.2, Sodium 139, GFR 97.09 08/20/2018 Creatinine 0.92, BUN 10, Potassium 4.3, Sodium 137, GFR 98.39  Recommendations:No changes and encouraged to call if experiencing any fluid symptoms.  Follow-up plan: ICM clinic phone appointment on10/04/2019. 91 day device clinic remote transmission9/13/2021.    EP/Cardiology Office Visits:Recalls for 09/13/2019 with Dr.Allred for 6 month f/u and 04/12/2020 with Dr Marlou Porch.   Copy of ICM check sent to Dr.Allred.   3 month ICM trend: 09/09/2019    1 Year ICM trend:       Rosalene Billings, RN 09/12/2019 9:20 AM

## 2019-09-22 ENCOUNTER — Ambulatory Visit (INDEPENDENT_AMBULATORY_CARE_PROVIDER_SITE_OTHER): Payer: HMO | Admitting: *Deleted

## 2019-09-22 DIAGNOSIS — I441 Atrioventricular block, second degree: Secondary | ICD-10-CM | POA: Diagnosis not present

## 2019-09-22 DIAGNOSIS — I428 Other cardiomyopathies: Secondary | ICD-10-CM

## 2019-09-22 LAB — CUP PACEART REMOTE DEVICE CHECK
Battery Remaining Longevity: 53 mo
Battery Remaining Percentage: 63 %
Battery Voltage: 2.9 V
Brady Statistic AP VP Percent: 98 %
Brady Statistic AP VS Percent: 1 %
Brady Statistic AS VP Percent: 1 %
Brady Statistic AS VS Percent: 1 %
Brady Statistic RA Percent Paced: 97 %
Date Time Interrogation Session: 20210913041643
Implantable Lead Implant Date: 20160310
Implantable Lead Implant Date: 20160310
Implantable Lead Implant Date: 20160310
Implantable Lead Location: 753858
Implantable Lead Location: 753859
Implantable Lead Location: 753860
Implantable Lead Model: 1948
Implantable Pulse Generator Implant Date: 20160310
Lead Channel Impedance Value: 400 Ohm
Lead Channel Impedance Value: 580 Ohm
Lead Channel Impedance Value: 700 Ohm
Lead Channel Pacing Threshold Amplitude: 0.75 V
Lead Channel Pacing Threshold Amplitude: 0.75 V
Lead Channel Pacing Threshold Amplitude: 0.75 V
Lead Channel Pacing Threshold Pulse Width: 0.5 ms
Lead Channel Pacing Threshold Pulse Width: 0.5 ms
Lead Channel Pacing Threshold Pulse Width: 0.5 ms
Lead Channel Sensing Intrinsic Amplitude: 12 mV
Lead Channel Sensing Intrinsic Amplitude: 2.7 mV
Lead Channel Setting Pacing Amplitude: 2 V
Lead Channel Setting Pacing Amplitude: 2.5 V
Lead Channel Setting Pacing Amplitude: 2.5 V
Lead Channel Setting Pacing Pulse Width: 0.5 ms
Lead Channel Setting Pacing Pulse Width: 0.5 ms
Lead Channel Setting Sensing Sensitivity: 2 mV
Pulse Gen Model: 3242
Pulse Gen Serial Number: 7724805

## 2019-09-24 NOTE — Progress Notes (Signed)
Remote pacemaker transmission.   

## 2019-10-16 ENCOUNTER — Telehealth: Payer: Self-pay

## 2019-10-16 NOTE — Telephone Encounter (Signed)
The pt agreed to send transmission Friday 10/17/2019 at 11 am.

## 2019-10-20 NOTE — Progress Notes (Signed)
No ICM remote transmission received for 10/13/2019 and next ICM transmission scheduled for 11/10/2019.

## 2019-10-28 ENCOUNTER — Telehealth: Payer: Self-pay

## 2019-10-28 ENCOUNTER — Ambulatory Visit (INDEPENDENT_AMBULATORY_CARE_PROVIDER_SITE_OTHER): Payer: HMO

## 2019-10-28 DIAGNOSIS — I5022 Chronic systolic (congestive) heart failure: Secondary | ICD-10-CM

## 2019-10-28 DIAGNOSIS — Z95810 Presence of automatic (implantable) cardiac defibrillator: Secondary | ICD-10-CM | POA: Diagnosis not present

## 2019-10-28 NOTE — Progress Notes (Signed)
EPIC Encounter for ICM Monitoring  Patient Name: Phillip Booth is a 69 y.o. male Date: 10/28/2019 Primary Care Physican: Wynelle Fanny, DO Primary Cardiologist:Skains Electrophysiologist: Allred Bi-V Pacing: >99% 09/12/2019 OfficeWeight: 224lbs  AT/AF Burden: <1% (Paroxysmal atrial fibrillation- noOAC)   Spoke with patient and reports feeling well at this time.  Denies fluid symptoms.    Corvue thoracic impedance suggesting normal fluid levels.   Prescribed:Furosemide 20 mg 1 tablet daily  Labs: 12/29/2018 Creatinine 0.93, BUN 15, Potassium 4.2, Sodium 139, GFR 97.09 08/20/2018 Creatinine 0.92, BUN 10, Potassium 4.3, Sodium 137, GFR 98.39  Recommendations:No changes and encouraged to call if experiencing any fluid symptoms.  Follow-up plan: ICM clinic phone appointment on11/222021. 91 day device clinic remote transmission12/13/2021.    EP/Cardiology Office Visits:Recalls for 09/13/2019 with Dr.Allred for 6 month f/u and 04/12/2020 with Dr Marlou Porch.   Copy of ICM check sent to Dr.Allred.   3 month ICM trend: 10/28/2019    1 Year ICM trend:       Rosalene Billings, RN 10/28/2019 4:28 PM

## 2019-10-28 NOTE — Telephone Encounter (Signed)
Call to patient as requested by voice mail message.  Pt asked if remote transmission has been received.  Advised monitor is showing no communication since September.  Attempted to assist in sending remote transmission but unable to send.  Advised to call [employer] number which he already has.  He will call for assistance.

## 2019-12-01 ENCOUNTER — Ambulatory Visit (INDEPENDENT_AMBULATORY_CARE_PROVIDER_SITE_OTHER): Payer: HMO

## 2019-12-01 DIAGNOSIS — I5022 Chronic systolic (congestive) heart failure: Secondary | ICD-10-CM | POA: Diagnosis not present

## 2019-12-01 DIAGNOSIS — Z95810 Presence of automatic (implantable) cardiac defibrillator: Secondary | ICD-10-CM

## 2019-12-01 NOTE — Progress Notes (Signed)
EPIC Encounter for ICM Monitoring  Patient Name: Phillip Booth is a 69 y.o. male Date: 12/01/2019 Primary Care Physican: Wynelle Fanny, DO Primary Cardiologist:Skains Electrophysiologist: Allred Bi-V Pacing: >99% 11/22/2021Weight: 224lbs  AT/AF Burden: <1% (Paroxysmal atrial fibrillation- noOAC)   Spoke with patient and reports feeling well at this time. Denies fluid symptoms.   Corvue thoracic impedancesuggesting normal fluid levels.   Prescribed:Furosemide 20 mg 1 tablet daily  Labs: 12/29/2018 Creatinine 0.93, BUN 15, Potassium 4.2, Sodium 139, GFR 97.09 08/20/2018 Creatinine 0.92, BUN 10, Potassium 4.3, Sodium 137, GFR 98.39  Recommendations:No changes and encouraged to call if experiencing any fluid symptoms.  Follow-up plan: ICM clinic phone appointment on12/27/2021. 91 day device clinic remote transmission12/13/2021.    EP/Cardiology Office Visits:Recalls for 09/13/2019 with Dr.Allred for 6 month f/u and 04/12/2020 with Dr Marlou Porch.   Copy of ICM check sent to Dr.Allred.   3 month ICM trend: 12/01/2019    1 Year ICM trend:       Rosalene Billings, RN 12/01/2019 4:25 PM

## 2019-12-02 ENCOUNTER — Other Ambulatory Visit: Payer: Self-pay | Admitting: *Deleted

## 2019-12-02 NOTE — Patient Outreach (Signed)
  [facility]) Care Management Chronic Special Needs Program    12/02/2019  Name: Phillip Booth, DOB: [birth year]  MRN: [id number]   [name] is enrolled in a chronic special needs plan for Congestive Heart Failure.  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  PLAN Outreach client next week  Jacqlyn Larsen [facility], BSN [place], [place]

## 2019-12-08 ENCOUNTER — Other Ambulatory Visit: Payer: Self-pay | Admitting: *Deleted

## 2019-12-08 NOTE — Patient Outreach (Signed)
  [facility]) Care Management Chronic Special Needs Program    12/08/2019  Name: Phillip Booth, DOB: 12-08-1950  MRN: [id number]   [name] is enrolled in a chronic special needs plan for Congestive Heart Failure.  Outreach call to client for telephone follow up outreach/ 2nd attempt, no answer to telephone, left voicemail requesting return phone call.  PLAN Outreach client in 1-2 weeks  Jacqlyn Larsen [facility], BSN [place], [place]

## 2019-12-09 ENCOUNTER — Other Ambulatory Visit: Payer: Self-pay | Admitting: *Deleted

## 2019-12-09 NOTE — Patient Outreach (Signed)
[facility]) Care Management Chronic Special Needs Program  12/09/2019  Name: Phillip Booth DOB: 1950/11/28  MRN: [id number]  Phillip Booth is enrolled in a chronic special needs plan for Chronic Heart Failure. Reviewed and updated care plan.  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  RN care manager explained to client his individualized care plan is due and RN care manager will complete and mail to client and send copy to primary care provider.  Client verbalizes understanding and states if he "gets a chance" may try to call RN care manager back.   Goals Addressed              This Visit's Progress   .  " I want to lose 15 pounds" (pt-stated)        RN care manager mailed EMMI education article "Weight Loss Tips" Please be mindful of portion sizes     .  Client understands the importance of follow-up with providers by attending scheduled visits        Continue to follow up with primary care provider and cardiologist      .  Client verbalize knowledge of Heart Failure disease self management skills by knowing what "color zone" he is in when talking to care team.        Review [employer] calendar (in the back section) sent in the mail for Heart failure information/ action plan. Read the "color" heart zones and know if you are in the red, yellow or green zones and be prepared to discuss with RN on each call. Call your provider if you feel you are getting in the "yellow" zone. Call 911 if you are in the "red" zone, unrelieved shortness of breath. It is important to weigh daily and write down weights.  Pay attention to your body if you have any shortness of breath, swelling in your feet, ankles, legs or your waistband gets tight. Call your provider if you gain  3 pounds overnight or 5 pounds in a week. Take your medications as prescribed, many will cause you to use the bathroom more. Follow a low salt meal plan, read food labels for the sodium (salt) content. Your provider may restrict or limit how much liquids you drink every day.  Increase exercise only if you are able to do it.  Follow doctor recommendations. EMMI education article provided "Heart Failure Exercise Guide"  Review and plan to discuss with RN during next telephonic assessment.       .  Client verbalizes knowledge of Heart Attack self management skills by 6 months.        Please call 911 for any symptoms of a heart attack.  Symptoms may include chest pain, pressure or tightness, pain that radiates to your jaw, neck or back, shortness of breath, nausea, indigestion, lightheadedness or sudden dizziness. Follow a heart healthy diet (rich in fruits, vegetables, lean proteins and low in salt).  Talk with your doctor about exercising. Take medications as prescribed. Follow up with your health care provider as recommended. Use 24 hour nurse advice line as needed at [phone]    .  Client will report no worsening of symptoms of Atrial Fibrillation within the next 12 months  Please review A-fib Action Plan in the back of [employer] calendar. Please report to your health care provider any worsening of symptoms such as racing or irregular heart beat that may be uncomfortable, shortness of breath with or without chest pain, dizziness, or weakness. Call 911 for severe symptoms such as chest pain, fainting, struggling to breathe. Increase exercise only if you are able to do it.  Follow doctor recommendations. Follow up with your cardiologist (heart doctor) for yearly visits. Please follow up with your health care provider as recommended. RN care manager provided education "Atrial Fibrillation"     .  Client will report no worsening of symptoms related to heart disease  within the next 12  months        Notify provider for symptoms of chest pain, sweating, nausea/ vomiting, irregular heartbeat, palpitations, rapid heart rate, shortness of breath, dizziness or fainting. Call 911 for severe symptoms of chest pain or shortness of breath. Take medications as prescribed. Follow a low salt meal plan, limit or avoid alcohol. Client reported no signs or of cardiac issues. Increase exercise only if you are able to do it.  Follow doctor recommendations. Follow up with your cardiologist (heart doctor) for yearly visits.            .  Client will verbalize knowledge of self management of Hypertension as evidences by BP reading of 140/90 or less; or as defined by provider        Plan to check blood pressure regularly.  If you do not have a B/P monitor (cuff), one can be provided to you.  Write results in your [employer] calendar (in the back section). Reviewed blood pressure medication from EMR. Take B/P medications as ordered.  Some may cause you to use the bathroom more. Plan to eat low salt and heart healthy meals full of fruits, vegetables, whole grains, lean protein and limit fat and sugars. Increase activity as tolerated. Reviewed lifestyle modification- smoking cessation, weight control and reducing stress.          .  Increase physical activity      .  Maintain timely refills of Heart Failure medication as prescribed within the year         Contact your RN care manager if you have questions about medicines. It is important to take your medications as prescribed.         Plan:    RN care manager faxed today's note and updated individualized care plan to primary care provider, mailed updated individualized care plan to client along with successful outreach letter and education articles.  Chronic care management coordinator will outreach in:  6 months  Kassie Mends Nursing/RN [place] Case Manager, C-SNP  [phone]  .

## 2019-12-11 ENCOUNTER — Other Ambulatory Visit: Payer: Self-pay | Admitting: *Deleted

## 2019-12-11 NOTE — Patient Outreach (Signed)
  [facility]) Care Management Chronic Special Needs Program    12/11/2019  Name: [name], DOB: 04-26-1950  MRN: [id number]   Mr. Sandor Arboleda is enrolled in a chronic special needs plan for Congestive Heart Failure.  [employer] care management team has assumed care and services for this member.  Case closed by [facility] care management.   Jacqlyn Larsen [facility], BSN [place], [place]

## 2019-12-22 ENCOUNTER — Ambulatory Visit (INDEPENDENT_AMBULATORY_CARE_PROVIDER_SITE_OTHER): Payer: HMO

## 2019-12-22 DIAGNOSIS — R001 Bradycardia, unspecified: Secondary | ICD-10-CM | POA: Diagnosis not present

## 2019-12-22 LAB — CUP PACEART REMOTE DEVICE CHECK
Battery Remaining Longevity: 53 mo
Battery Remaining Percentage: 63 %
Battery Voltage: 2.9 V
Brady Statistic AP VP Percent: 98 %
Brady Statistic AP VS Percent: 1 %
Brady Statistic AS VP Percent: 1 %
Brady Statistic AS VS Percent: 1 %
Brady Statistic RA Percent Paced: 97 %
Date Time Interrogation Session: 20211213035724
Implantable Lead Implant Date: 20160310
Implantable Lead Implant Date: 20160310
Implantable Lead Implant Date: 20160310
Implantable Lead Location: 753858
Implantable Lead Location: 753859
Implantable Lead Location: 753860
Implantable Lead Model: 1948
Implantable Pulse Generator Implant Date: 20160310
Lead Channel Impedance Value: 410 Ohm
Lead Channel Impedance Value: 560 Ohm
Lead Channel Impedance Value: 730 Ohm
Lead Channel Pacing Threshold Amplitude: 0.75 V
Lead Channel Pacing Threshold Amplitude: 0.75 V
Lead Channel Pacing Threshold Amplitude: 0.75 V
Lead Channel Pacing Threshold Pulse Width: 0.5 ms
Lead Channel Pacing Threshold Pulse Width: 0.5 ms
Lead Channel Pacing Threshold Pulse Width: 0.5 ms
Lead Channel Sensing Intrinsic Amplitude: 2.6 mV
Lead Channel Sensing Intrinsic Amplitude: 6.3 mV
Lead Channel Setting Pacing Amplitude: 2 V
Lead Channel Setting Pacing Amplitude: 2.5 V
Lead Channel Setting Pacing Amplitude: 2.5 V
Lead Channel Setting Pacing Pulse Width: 0.5 ms
Lead Channel Setting Pacing Pulse Width: 0.5 ms
Lead Channel Setting Sensing Sensitivity: 2 mV
Pulse Gen Model: 3242
Pulse Gen Serial Number: 7724805

## 2020-01-01 NOTE — Progress Notes (Signed)
Remote pacemaker transmission.   

## 2020-01-05 ENCOUNTER — Ambulatory Visit (INDEPENDENT_AMBULATORY_CARE_PROVIDER_SITE_OTHER): Payer: HMO

## 2020-01-05 DIAGNOSIS — I5022 Chronic systolic (congestive) heart failure: Secondary | ICD-10-CM | POA: Diagnosis not present

## 2020-01-05 DIAGNOSIS — Z95810 Presence of automatic (implantable) cardiac defibrillator: Secondary | ICD-10-CM

## 2020-01-06 NOTE — Progress Notes (Signed)
EPIC Encounter for ICM Monitoring  Patient Name: Phillip Booth is a 69 y.o. male Date: 01/06/2020 Primary Care Physican: Verdell Face, DO Primary Cardiologist:Skains Electrophysiologist: Allred Bi-V Pacing: >99% 11/22/2021Weight: 224lbs  AT/AF Burden: <1% (Paroxysmal atrial fibrillation- noOAC)   Spoke with patient and reports feeling well at this time.  Denies fluid symptoms.    Corvue thoracic impedancesuggesting normal fluid levels.   Prescribed:Furosemide 20 mg 1 tablet daily  Labs: 12/29/2018 Creatinine 0.93, BUN 15, Potassium 4.2, Sodium 139, GFR 97.09 08/20/2018 Creatinine 0.92, BUN 10, Potassium 4.3, Sodium 137, GFR 98.39  Recommendations: No changes and encouraged to call if experiencing any fluid symptoms.  Follow-up plan: ICM clinic phone appointment on2/01/2020. 91 day device clinic remote transmission3/14/2022.    EP/Cardiology Office Visits:Recalls for 09/13/2019 with Dr.Allred for 6 month f/u and 04/12/2020 with Dr Anne Fu.   Copy of ICM check sent to Dr.Allred.  3 month ICM trend: 01/05/2020    1 Year ICM trend:       Karie Soda, RN 01/06/2020 8:42 AM

## 2020-02-10 ENCOUNTER — Ambulatory Visit (INDEPENDENT_AMBULATORY_CARE_PROVIDER_SITE_OTHER): Payer: Medicare Other

## 2020-02-10 DIAGNOSIS — Z95810 Presence of automatic (implantable) cardiac defibrillator: Secondary | ICD-10-CM | POA: Diagnosis not present

## 2020-02-10 DIAGNOSIS — I5022 Chronic systolic (congestive) heart failure: Secondary | ICD-10-CM | POA: Diagnosis not present

## 2020-02-13 NOTE — Progress Notes (Signed)
EPIC Encounter for ICM Monitoring  Patient Name: Phillip Booth is a 70 y.o. male Date: 02/13/2020 Primary Care Physican: Wynelle Fanny, DO Primary Cardiologist:Skains Electrophysiologist: Allred Bi-V Pacing: >99% 02/13/2020 Weight: 222-224lbs  AT/AF Burden: <1% (Paroxysmal atrial fibrillation- noOAC)   Spoke with patient and reports feeling well at this time.  Denies fluid symptoms.    Corvue thoracic impedancesuggesting normal fluid levels.   Prescribed:Furosemide 20 mg 1 tablet daily  Labs: 12/29/2018 Creatinine 0.93, BUN 15, Potassium 4.2, Sodium 139, GFR 97.09 08/20/2018 Creatinine 0.92, BUN 10, Potassium 4.3, Sodium 137, GFR 98.39  Recommendations: No changes and encouraged to call if experiencing any fluid symptoms.  Follow-up plan: ICM clinic phone appointment on3/07/2020. 91 day device clinic remote transmission3/14/2022.    EP/Cardiology Office Visits:Recalls for 09/13/2019 with Dr.Allred for 6 month f/u and 04/12/2020 with Dr Marlou Porch.   Copy of ICM check sent to Dr.Allred.  3 month ICM trend: 02/10/2020.    1 Year ICM trend:       Rosalene Billings, RN 02/13/2020 8:22 AM

## 2020-03-15 ENCOUNTER — Ambulatory Visit (INDEPENDENT_AMBULATORY_CARE_PROVIDER_SITE_OTHER): Payer: Medicare Other

## 2020-03-15 DIAGNOSIS — I5022 Chronic systolic (congestive) heart failure: Secondary | ICD-10-CM | POA: Diagnosis not present

## 2020-03-15 DIAGNOSIS — Z95810 Presence of automatic (implantable) cardiac defibrillator: Secondary | ICD-10-CM | POA: Diagnosis not present

## 2020-03-16 NOTE — Progress Notes (Signed)
EPIC Encounter for ICM Monitoring  Patient Name: Phillip Booth is a 70 y.o. male Date: 03/16/2020 Primary Care Physican: Wynelle Fanny, DO Primary Cardiologist:Skains Electrophysiologist: Allred Bi-V Pacing: >99% 03/16/2020 Weight: 222-224lbs  AT/AF Burden: <1% (Paroxysmal atrial fibrillation- noOAC)   Spoke with patient and reports feeling well at this time.  Denies fluid symptoms.    Corvue thoracic impedancesuggesting normal fluid levels.   Prescribed:Furosemide 20 mg 1 tablet daily  Labs: 12/29/2018 Creatinine 0.93, BUN 15, Potassium 4.2, Sodium 139, GFR 97.09 08/20/2018 Creatinine 0.92, BUN 10, Potassium 4.3, Sodium 137, GFR 98.39  Recommendations: No changes and encouraged to call if experiencing any fluid symptoms.  Follow-up plan: ICM clinic phone appointment on 04/19/2020. 91 day device clinic remote transmission3/14/2022.    EP/Cardiology Office Visits:Recalls for 09/13/2019 with Dr.Allred for 6 month f/u and 04/12/2020 with Dr Marlou Porch.   Copy of ICM check sent to Dr.Allred.  3 month ICM trend: 03/15/2020.    1 Year ICM trend:       Rosalene Billings, RN 03/16/2020 11:42 AM

## 2020-03-19 ENCOUNTER — Other Ambulatory Visit: Payer: Self-pay

## 2020-03-19 ENCOUNTER — Emergency Department (HOSPITAL_BASED_OUTPATIENT_CLINIC_OR_DEPARTMENT_OTHER)
Admission: EM | Admit: 2020-03-19 | Discharge: 2020-03-19 | Discharge status: Against Medical Advice | Payer: Medicare Other

## 2020-03-20 ENCOUNTER — Emergency Department (HOSPITAL_BASED_OUTPATIENT_CLINIC_OR_DEPARTMENT_OTHER)
Admission: EM | Admit: 2020-03-20 | Discharge: 2020-03-21 | Disposition: A | Discharge status: Home / Self Care | Payer: Medicare Other | Attending: Emergency Medicine | Admitting: Emergency Medicine

## 2020-03-20 ENCOUNTER — Encounter (HOSPITAL_BASED_OUTPATIENT_CLINIC_OR_DEPARTMENT_OTHER): Payer: Self-pay | Admitting: *Deleted

## 2020-03-20 ENCOUNTER — Other Ambulatory Visit: Payer: Self-pay

## 2020-03-20 DIAGNOSIS — M542 Cervicalgia: Secondary | ICD-10-CM | POA: Insufficient documentation

## 2020-03-20 DIAGNOSIS — Z7982 Long term (current) use of aspirin: Secondary | ICD-10-CM | POA: Insufficient documentation

## 2020-03-20 DIAGNOSIS — Y92410 Unspecified street and highway as the place of occurrence of the external cause: Secondary | ICD-10-CM | POA: Insufficient documentation

## 2020-03-20 DIAGNOSIS — S199XXA Unspecified injury of neck, initial encounter: Secondary | ICD-10-CM | POA: Diagnosis present

## 2020-03-20 DIAGNOSIS — I10 Essential (primary) hypertension: Secondary | ICD-10-CM | POA: Diagnosis not present

## 2020-03-20 DIAGNOSIS — Z79899 Other long term (current) drug therapy: Secondary | ICD-10-CM | POA: Diagnosis not present

## 2020-03-20 DIAGNOSIS — I2510 Atherosclerotic heart disease of native coronary artery without angina pectoris: Secondary | ICD-10-CM | POA: Insufficient documentation

## 2020-03-20 DIAGNOSIS — S46811A Strain of other muscles, fascia and tendons at shoulder and upper arm level, right arm, initial encounter: Secondary | ICD-10-CM | POA: Diagnosis not present

## 2020-03-20 MED ORDER — DICLOFENAC SODIUM 1 % EX GEL
4.0000 g | Freq: Four times a day (QID) | CUTANEOUS | 0 refills | Status: DC
Start: 2020-03-20 — End: 2021-11-30

## 2020-03-20 NOTE — Discharge Instructions (Addendum)
Use the gel as prescribed.  Follow up with your family doc or sports med.  Also take tylenol 1000mg (2 extra strength) four times a day.   Your sling is there for comfort.  You need to take your arm out of the sling at least 4 times a day and do range of motion exercises with your shoulder.  Please return for the ED for recurrent weakness to the extremity.
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
## 2020-03-22 ENCOUNTER — Ambulatory Visit (INDEPENDENT_AMBULATORY_CARE_PROVIDER_SITE_OTHER): Payer: Medicare Other

## 2020-03-22 DIAGNOSIS — I428 Other cardiomyopathies: Secondary | ICD-10-CM | POA: Diagnosis not present

## 2020-03-22 LAB — CUP PACEART REMOTE DEVICE CHECK
Battery Remaining Longevity: 41 mo
Battery Remaining Percentage: 50 %
Battery Voltage: 2.87 V
Brady Statistic AP VP Percent: 98 %
Brady Statistic AP VS Percent: 1 %
Brady Statistic AS VP Percent: 1.1 %
Brady Statistic AS VS Percent: 1 %
Brady Statistic RA Percent Paced: 97 %
Date Time Interrogation Session: 20220314034107
Implantable Lead Implant Date: 20160310
Implantable Lead Implant Date: 20160310
Implantable Lead Implant Date: 20160310
Implantable Lead Location: 753858
Implantable Lead Location: 753859
Implantable Lead Location: 753860
Implantable Lead Model: 1948
Implantable Pulse Generator Implant Date: 20160310
Lead Channel Impedance Value: 380 Ohm
Lead Channel Impedance Value: 560 Ohm
Lead Channel Impedance Value: 700 Ohm
Lead Channel Pacing Threshold Amplitude: 0.75 V
Lead Channel Pacing Threshold Amplitude: 0.75 V
Lead Channel Pacing Threshold Amplitude: 0.75 V
Lead Channel Pacing Threshold Pulse Width: 0.5 ms
Lead Channel Pacing Threshold Pulse Width: 0.5 ms
Lead Channel Pacing Threshold Pulse Width: 0.5 ms
Lead Channel Sensing Intrinsic Amplitude: 12 mV
Lead Channel Sensing Intrinsic Amplitude: 2.6 mV
Lead Channel Setting Pacing Amplitude: 2 V
Lead Channel Setting Pacing Amplitude: 2.5 V
Lead Channel Setting Pacing Amplitude: 2.5 V
Lead Channel Setting Pacing Pulse Width: 0.5 ms
Lead Channel Setting Pacing Pulse Width: 0.5 ms
Lead Channel Setting Sensing Sensitivity: 2 mV
Pulse Gen Model: 3242
Pulse Gen Serial Number: 7724805

## 2020-03-25 ENCOUNTER — Encounter: Payer: Self-pay | Admitting: Family Medicine

## 2020-03-25 ENCOUNTER — Other Ambulatory Visit: Payer: Self-pay

## 2020-03-25 ENCOUNTER — Ambulatory Visit (INDEPENDENT_AMBULATORY_CARE_PROVIDER_SITE_OTHER): Payer: Medicare Other | Admitting: Family Medicine

## 2020-03-25 VITALS — BP 130/76 | Ht 76.0 in | Wt 225.0 lb

## 2020-03-25 DIAGNOSIS — S8002XA Contusion of left knee, initial encounter: Secondary | ICD-10-CM | POA: Diagnosis not present

## 2020-03-25 DIAGNOSIS — S161XXA Strain of muscle, fascia and tendon at neck level, initial encounter: Secondary | ICD-10-CM

## 2020-03-25 DIAGNOSIS — M5412 Radiculopathy, cervical region: Secondary | ICD-10-CM

## 2020-03-25 HISTORY — DX: Strain of muscle, fascia and tendon at neck level, initial encounter: S16.1XXA

## 2020-03-25 MED ORDER — PREDNISONE 5 MG PO TABS
ORAL_TABLET | ORAL | 0 refills | Status: DC
Start: 2020-03-25 — End: 2020-06-09

## 2020-03-25 NOTE — Patient Instructions (Signed)
Nice to meet you Please try heat the neck Please try the exercises  Please try ice  Please send me a message in MyChart with any questions or updates.  Please see me back in 2 weeks.   --Dr. Raeford Razor
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
## 2020-03-25 NOTE — Assessment & Plan Note (Signed)
Has pain on the right side which seems to have a muscular component. -Counseled home exercise therapy and supportive care. -Could consider imaging, physical therapy or trigger point injections.

## 2020-03-29 NOTE — Progress Notes (Signed)
Remote pacemaker transmission.   

## 2020-04-12 ENCOUNTER — Other Ambulatory Visit: Payer: Self-pay

## 2020-04-12 ENCOUNTER — Ambulatory Visit: Payer: Self-pay

## 2020-04-12 ENCOUNTER — Ambulatory Visit (INDEPENDENT_AMBULATORY_CARE_PROVIDER_SITE_OTHER): Payer: Medicare Other | Admitting: Family Medicine

## 2020-04-12 DIAGNOSIS — M5412 Radiculopathy, cervical region: Secondary | ICD-10-CM

## 2020-04-12 DIAGNOSIS — M25521 Pain in right elbow: Secondary | ICD-10-CM

## 2020-04-12 DIAGNOSIS — G5621 Lesion of ulnar nerve, right upper limb: Secondary | ICD-10-CM | POA: Diagnosis not present

## 2020-04-12 DIAGNOSIS — S8002XD Contusion of left knee, subsequent encounter: Secondary | ICD-10-CM | POA: Diagnosis not present

## 2020-04-12 DIAGNOSIS — M7701 Medial epicondylitis, right elbow: Secondary | ICD-10-CM | POA: Insufficient documentation

## 2020-04-12 NOTE — Assessment & Plan Note (Signed)
Symptoms seem most consistent with irritation of the ulnar nerve. -Counseled home exercise therapy and supportive care. -Pennsaid samples. -Could consider injection of physical therapy.

## 2020-04-12 NOTE — Progress Notes (Signed)
Medication Samples have been provided to the patient.  Drug name: Pennsaid    Strength: 2%      Qty: 2 boxes LOT: [id number]  Exp.Date: 7/23  Dosing instructions: use a pea sized amount on affected area twice daily  The patient has been instructed regarding the correct time, dose, and frequency of taking this medication, including desired effects and most common side effects.   Phillip Booth 9:06 AM 04/12/2020
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
## 2020-04-12 NOTE — Patient Instructions (Signed)
Good to see you Please try the rub on medicine  Please try the exercises   Please send me a message in MyChart with any questions or updates.  Please see me back in 4 weeks or as needed if better.   --Dr. Raeford Razor
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
## 2020-04-19 ENCOUNTER — Ambulatory Visit (INDEPENDENT_AMBULATORY_CARE_PROVIDER_SITE_OTHER): Payer: Medicare Other

## 2020-04-19 DIAGNOSIS — Z95810 Presence of automatic (implantable) cardiac defibrillator: Secondary | ICD-10-CM

## 2020-04-19 DIAGNOSIS — I5022 Chronic systolic (congestive) heart failure: Secondary | ICD-10-CM

## 2020-04-20 ENCOUNTER — Telehealth: Payer: Self-pay

## 2020-04-20 NOTE — Telephone Encounter (Signed)
Remote ICM transmission received.  Attempted call to patient regarding ICM remote transmission and left detailed message per DPR.  Advised to return call for any fluid symptoms or questions. Next ICM remote transmission scheduled 06/01/2020.

## 2020-04-20 NOTE — Progress Notes (Signed)
EPIC Encounter for ICM Monitoring  Patient Name: Plez Belton is a 70 y.o. male Date: 04/20/2020 Primary Care Physican: Wynelle Fanny, DO Primary Cardiologist:Skains Electrophysiologist: Allred Bi-V Pacing: >99% 3/8/2022Weight:222-224lbs  AT/AF Burden: <1% (Paroxysmal atrial fibrillation- noOAC)   Attempted call to patient and unable to reach.  Left detailed message per DPR regarding transmission. Transmission reviewed.   Corvue thoracic impedancesuggesting normal fluid levels.   Prescribed:Furosemide 20 mg 1 tablet daily  Labs: 12/29/2018 Creatinine 0.93, BUN 15, Potassium 4.2, Sodium 139, GFR 97.09 08/20/2018 Creatinine 0.92, BUN 10, Potassium 4.3, Sodium 137, GFR 98.39  Recommendations: Left voice mail with ICM number and encouraged to call if experiencing any fluid symptoms.  Follow-up plan: ICM clinic phone appointment on 06/01/2020. 91 day device clinic remote transmission6/13/2022.    EP/Cardiology Office Visits:   06/01/2020 with Dr Marlou Porch.   Recalls for 09/13/2019 with Dr.Allred for 6 month f/u.   Copy of ICM check sent to Dr.Allred.  3 month ICM trend: 04/19/2020.    1 Year ICM trend:       Rosalene Billings, RN 04/20/2020 9:12 AM

## 2020-05-10 ENCOUNTER — Ambulatory Visit (INDEPENDENT_AMBULATORY_CARE_PROVIDER_SITE_OTHER): Payer: Medicare Other | Admitting: Family Medicine

## 2020-05-10 ENCOUNTER — Encounter: Payer: Self-pay | Admitting: Family Medicine

## 2020-05-10 ENCOUNTER — Other Ambulatory Visit: Payer: Self-pay

## 2020-05-10 DIAGNOSIS — M7701 Medial epicondylitis, right elbow: Secondary | ICD-10-CM | POA: Diagnosis not present

## 2020-05-10 NOTE — Assessment & Plan Note (Addendum)
Has gotten improvement with pennsaid samples. Symptoms seem more on the epicondyle as opposed to the ulnar nerve.  -Counseled on home exercise therapy and supportive care. -Counseled on compression. -Provided Pennsaid samples. -Could consider injection or physical therapy.

## 2020-05-10 NOTE — Patient Instructions (Signed)
Good to see you Please continue the pennsaid on the elbow.  Please try the exercises   Please send me a message in MyChart with any questions or updates.  Please see me back [name] back as needed.   --Dr. Raeford Razor

## 2020-05-10 NOTE — Progress Notes (Signed)
Medication Samples have been provided to the patient.  Drug name: Pennsaid      Strength: 2%        Qty: 2 boxes LOT: [id number] Exp.Date: 06/2021  Dosing instructions: Use a pea sized amount  The patient has been instructed regarding the correct time, dose, and frequency of taking this medication, including desired effects and most common side effects.   Phillip Booth 9:59 AM 05/10/2020
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
## 2020-05-31 ENCOUNTER — Ambulatory Visit (INDEPENDENT_AMBULATORY_CARE_PROVIDER_SITE_OTHER): Payer: Medicare Other

## 2020-05-31 DIAGNOSIS — Z95810 Presence of automatic (implantable) cardiac defibrillator: Secondary | ICD-10-CM | POA: Diagnosis not present

## 2020-05-31 DIAGNOSIS — I5022 Chronic systolic (congestive) heart failure: Secondary | ICD-10-CM

## 2020-05-31 NOTE — Progress Notes (Signed)
EPIC Encounter for ICM Monitoring  Patient Name: Phillip Booth is a 70 y.o. male Date: 05/31/2020 Primary Care Physican: Wynelle Fanny, DO Primary Cardiologist:Skains Electrophysiologist: Allred Bi-V Pacing: >99% 5/2/2022Office Weight:222-224lbs  AT/AF Burden: <1% (Paroxysmal atrial fibrillation- noOAC)   Attempted call to patient and unable to reach.  Left detailed message per DPR regarding transmission. Transmission reviewed.   Corvue thoracic impedancesuggesting normal fluid levels.   Prescribed:Furosemide 20 mg 1 tablet daily  Labs: 12/29/2018 Creatinine 0.93, BUN 15, Potassium 4.2, Sodium 139, GFR 97.09 08/20/2018 Creatinine 0.92, BUN 10, Potassium 4.3, Sodium 137, GFR 98.39  Recommendations: Left voice mail with ICM number and encouraged to call if experiencing any fluid symptoms.  Follow-up plan: ICM clinic phone appointment on6/27/2022. 91 day device clinic remote transmission6/13/2022.    EP/Cardiology Office Visits: 06/01/2020 with Dr Marlou Porch.  Recalls for 09/13/2019 with Dr.Allred for 6 month f/u.   Copy of ICM check sent to Dr.Allred.   3 month ICM trend: 05/31/2020.    1 Year ICM trend:       Rosalene Billings, RN 05/31/2020 3:35 PM

## 2020-06-01 ENCOUNTER — Ambulatory Visit: Payer: Medicare Other | Admitting: Cardiology

## 2020-06-09 ENCOUNTER — Other Ambulatory Visit: Payer: Self-pay

## 2020-06-09 ENCOUNTER — Encounter: Payer: Self-pay | Admitting: Cardiology

## 2020-06-09 ENCOUNTER — Ambulatory Visit (INDEPENDENT_AMBULATORY_CARE_PROVIDER_SITE_OTHER): Payer: Medicare Other | Admitting: Cardiology

## 2020-06-09 VITALS — BP 120/70 | HR 60 | Ht 76.0 in | Wt 215.0 lb

## 2020-06-09 DIAGNOSIS — I5022 Chronic systolic (congestive) heart failure: Secondary | ICD-10-CM | POA: Diagnosis not present

## 2020-06-09 DIAGNOSIS — I428 Other cardiomyopathies: Secondary | ICD-10-CM

## 2020-06-09 DIAGNOSIS — Z95810 Presence of automatic (implantable) cardiac defibrillator: Secondary | ICD-10-CM | POA: Diagnosis not present

## 2020-06-09 NOTE — Progress Notes (Signed)
Cardiology Office Note:    Date:  06/09/2020   ID:  Phillip Booth, DOB January 27, 1950, MRN [id number]  PCP:  Wynelle Fanny, DO   [facility] Providers Cardiologist:  Candee Furbish, MD Cardiology APP:  Patsey Berthold, NP     Referring MD: Wynelle Fanny, DO    History of Present Illness:    Phillip Booth is a 70 y.o. male here with chronic systolic heart failure IV dye the ICD.  Overall doing fairly well without any chest discomfort shortness of breath no orthopnea.  Prior nuclear stress test was low risk.  Enjoys singing.  Told me about drummer Earline Mayotte and played with Merry Proud at the [facility]. Drives bus  Past Medical History:  Diagnosis Date  . Allergy   . CAD (coronary artery disease)    a. moderate by cath 03/2014  . Cataract   . Cervical strain 03/25/2020  . CVA (cerebral infarction)    a. diagnosis not clear  . Hyperlipidemia   . Hypertension   . Non-ischemic cardiomyopathy ([place])    a. EF 45% by echo 03/2014  . Paroxysmal atrial fibrillation ([place]) 05/01/2014   asymptomatic, documented on PPM interrogation,  chads2vasc score is at least 6.  He is contemplating anticoagulation  . Symptomatic bradycardia    a. s/p STJ CRTP implanted by Dr Rayann Heman  . Vertigo     Past Surgical History:  Procedure Laterality Date  . ABDOMINAL SURGERY    . APPENDECTOMY    . BI-VENTRICULAR PACEMAKER INSERTION N/A 03/19/2014   STJ CRTP implanted by Dr Rayann Heman  . EYE SURGERY     Cataract removed from Right eye  . HERNIA REPAIR    . LEFT HEART CATHETERIZATION WITH CORONARY ANGIOGRAM N/A 03/19/2014   Procedure: LEFT HEART CATHETERIZATION WITH CORONARY ANGIOGRAM;  Surgeon: [name], MD;  Location: [facility] CATH LAB;  Service: Cardiovascular;  Laterality: N/A;    Current Medications: Current Meds  Medication Sig  . albuterol (VENTOLIN HFA) 108 (90 Base) MCG/ACT inhaler INHALE 2 PUFFS INTO THE LUNGS EVERY 6 (SIX) HOURS AS NEEDED FOR WHEEZING OR SHORTNESS OF BREATH.  Marland Kitchen amLODipine (NORVASC) 10 MG tablet  Take 1 tablet (10 mg total) by mouth daily.  Marland Kitchen aspirin 81 MG tablet Take 81 mg by mouth daily.   Marland Kitchen azelastine (ASTELIN) 0.1 % nasal spray Place 2 sprays into both nostrils 2 (two) times daily. Use in each nostril as directed  . carvedilol (COREG) 6.25 MG tablet TAKE 1 TABLET BY MOUTH TWICE DAILY WITH A MEAL  . diclofenac Sodium (VOLTAREN) 1 % GEL Apply 4 g topically 4 (four) times daily.  . fluticasone (FLONASE) 50 MCG/ACT nasal spray Place 2 sprays into both nostrils daily.  . fluticasone (FLOVENT HFA) 110 MCG/ACT inhaler Inhale 2 puffs into the lungs 2 (two) times daily.  . furosemide (LASIX) 20 MG tablet TAKE 1 TABLET BY MOUTH DAILY  . isosorbide mononitrate (IMDUR) 30 MG 24 hr tablet TAKE 1 TABLET (30 MG TOTAL) BY MOUTH DAILY.  Marland Kitchen ketorolac (ACULAR) 0.5 % ophthalmic solution   . levocetirizine (XYZAL) 5 MG tablet Take 1 tablet (5 mg total) by mouth every evening.  Marland Kitchen lisinopril (ZESTRIL) 40 MG tablet TAKE 1 TABLET (40 MG TOTAL) BY MOUTH DAILY.  . metFORMIN (GLUCOPHAGE-XR) 500 MG 24 hr tablet Take 500 mg by mouth daily.  . montelukast (SINGULAIR) 10 MG tablet Take 1 tablet (10 mg total) by mouth at bedtime.  . rosuvastatin (CRESTOR) 10 MG tablet Take 1 tablet (10 mg  total) by mouth daily.     Allergies:   Lactose intolerance (gi)   Social History   Socioeconomic History  . Marital status: Married    Spouse name: Not on file  . Number of children: Not on file  . Years of education: Not on file  . Highest education level: Not on file  Occupational History  . Not on file  Tobacco Use  . Smoking status: Never Smoker  . Smokeless tobacco: Never Used  Vaping Use  . Vaping Use: Never used  Substance and Sexual Activity  . Alcohol use: Not Currently    Comment: occasional  . Drug use: No  . Sexual activity: Not on file  Other Topics Concern  . Not on file  Social History Narrative  . Not on file   Social Determinants of Health   Financial Resource Strain: Not on file  Food  Insecurity: Not on file  Transportation Needs: No Transportation Needs  . Lack of Transportation (Medical): No  . Lack of Transportation (Non-Medical): No  Physical Activity: Not on file  Stress: Not on file  Social Connections: Not on file     Family History: The patient's family history includes Cancer in his mother; Diabetes in his mother; Emphysema in his father; Heart Problems in his brother and another family member; Heart disease in his mother; Hypertension in his father and mother; Other in his mother.  ROS:   Please see the history of present illness.     All other systems reviewed and are negative.  EKGs/Labs/Other Studies Reviewed:    The following studies were reviewed today:  ECHO 09/20/2018:  1. The left ventricle has mildly reduced systolic function, with an  ejection fraction of 45-50%. The cavity size was mildly dilated. There is  moderate asymmetric left ventricular hypertrophy of the septal wall. Left  ventricular diastolic Doppler  parameters are consistent with restrictive filling. Elevated mean left  atrial pressure The E/e' is 12. There is abnormal septal motion consistent  with RV pacemaker. Left ventricular diffuse hypokinesis.  2. Left atrial size was mildly dilated.  3. Right atrial size was mildly dilated.  4. Trivial pericardial effusion is present.  5. Mild thickening of the mitral valve leaflet.  6. The aortic valve is tricuspid. Mild thickening of the aortic valve.  Mild calcification of the aortic valve. Aortic valve regurgitation is mild  by color flow Doppler.  7. The aorta is normal unless otherwise noted.  8. The aortic root and aortic arch are normal in size and structure.  9. There is mild dilatation of the ascending aorta measuring 37 mm.   NUC stress 03/21/19:  The left ventricular ejection fraction is mildly decreased (45-54%).  EF calculated at 48% but visually appears at least 50-55%. Consider correlation with  echo.  There was no ST segment deviation noted during stress.  The study is normal.  This is a low risk study.    EKG:  EKG is  ordered today.  The ekg ordered today demonstrates paced rhythm sinus rhythm heart rate 60.  Recent Labs: 07/22/2019: ALT 20; BUN 14; Creatinine, Ser 0.83; Hemoglobin 14.0; Platelets 194; Potassium 4.2; Sodium 138  Recent Lipid Panel    Component Value Date/Time   CHOL 192 12/11/2018 1345   TRIG 186.0 (H) 12/11/2018 1345   HDL 54.10 12/11/2018 1345   CHOLHDL 4 12/11/2018 1345   VLDL 37.2 12/11/2018 1345   LDLCALC 101 (H) 12/11/2018 1345     Risk Assessment/Calculations:  Physical Exam:    VS:  BP 120/70 (BP Location: Left Arm, Patient Position: Sitting, Cuff Size: Normal)   Pulse 60   Ht 6\' 4"  (1.93 m)   Wt 215 lb (97.5 kg)   SpO2 98%   BMI 26.17 kg/m     Wt Readings from Last 3 Encounters:  06/09/20 215 lb (97.5 kg)  05/10/20 225 lb (102.1 kg)  03/25/20 225 lb (102.1 kg)     GEN:  Well nourished, well developed in no acute distress HEENT: Normal NECK: No JVD; No carotid bruits LYMPHATICS: No lymphadenopathy CARDIAC: RRR, no murmurs, rubs, gallops RESPIRATORY:  Clear to auscultation without rales, wheezing or rhonchi  ABDOMEN: Soft, non-tender, non-distended MUSCULOSKELETAL:  No edema; No deformity  SKIN: Warm and dry NEUROLOGIC:  Alert and oriented x 3 PSYCHIATRIC:  Normal affect   ASSESSMENT:    1. Chronic systolic congestive heart failure ([place])   2. ICD (implantable cardioverter-defibrillator) in place   3. Non-ischemic cardiomyopathy ([facility])    PLAN:    In order of problems listed above:  Chest pressure - Prior nuclear stress test low risk EF is slightly improved on stress.  On current medications.  Reviewed.  No changes made.  Had some subtle chest discomfort when taking out the garbage.  Last years nuclear stress test was reassuring low risk.  Continue to monitor this.  He could try Pepcid in case this is heartburn.   Blood pressure excellent today.  Watch for any signs of dizziness.  Chronic systolic heart failure-EF has improved to close to 50%. - EF had improved on nuclear stress test.  I think makes sense for [name] to continue with her current drug regimen.  He is on lisinopril 40 mg carvedilol 6.25 mg twice a day aspirin.  He is also on Norvasc 10 mg a day for hypertension.  Continue with refills as needed.  Biventricular ICD - Continue with AV pacing.  Seen on ECG today.   1 yr    Medication Adjustments/Labs and Tests Ordered: Current medicines are reviewed at length with the patient today.  Concerns regarding medicines are outlined above.  No orders of the defined types were placed in this encounter.  No orders of the defined types were placed in this encounter.   Patient Instructions  Medication Instructions:  The current medical regimen is effective;  continue present plan and medications.  *If you need a refill on your cardiac medications before your next appointment, please call your pharmacy*  Follow-Up: At [facility], you and your health needs are our priority.  As part of our continuing mission to provide you with exceptional heart care, we have created designated Provider Care Teams.  These Care Teams include your primary Cardiologist (physician) and Advanced Practice Providers (APPs -  Physician Assistants and Nurse Practitioners) who all work together to provide you with the care you need, when you need it.  We recommend signing up for the patient portal called "MyChart".  Sign up information is provided on this After Visit Summary.  MyChart is used to connect with patients for Virtual Visits (Telemedicine).  Patients are able to view lab/test results, encounter notes, upcoming appointments, etc.  Non-urgent messages can be sent to your provider as well.   To learn more about what you can do with MyChart, go to [email or web address].    Your next appointment:   12  month(s)  The format for your next appointment:   In Person  Provider:   Candee Furbish, MD  Thank you for choosing [facility]!!        Signed, Candee Furbish, MD  06/09/2020 11:59 AM    [place]

## 2020-06-09 NOTE — Patient Instructions (Signed)
Medication Instructions:  The current medical regimen is effective;  continue present plan and medications.  *If you need a refill on your cardiac medications before your next appointment, please call your pharmacy*  Follow-Up: At [facility], you and your health needs are our priority.  As part of our continuing mission to provide you with exceptional heart care, we have created designated Provider Care Teams.  These Care Teams include your primary Cardiologist (physician) and Advanced Practice Providers (APPs -  Physician Assistants and Nurse Practitioners) who all work together to provide you with the care you need, when you need it.  We recommend signing up for the patient portal called "MyChart".  Sign up information is provided on this After Visit Summary.  MyChart is used to connect with patients for Virtual Visits (Telemedicine).  Patients are able to view lab/test results, encounter notes, upcoming appointments, etc.  Non-urgent messages can be sent to your provider as well.   To learn more about what you can do with MyChart, go to [email or web address].    Your next appointment:   12 month(s)  The format for your next appointment:   In Person  Provider:   Mark Skains, MD   Thank you for choosing [facility]!!      

## 2020-06-16 NOTE — Addendum Note (Signed)
Addended by: Maren Beach, Mabel Unrein A on: 06/16/2020 01:49 PM   Modules accepted: Orders

## 2020-06-21 ENCOUNTER — Ambulatory Visit (INDEPENDENT_AMBULATORY_CARE_PROVIDER_SITE_OTHER): Payer: Medicare Other

## 2020-06-21 DIAGNOSIS — I428 Other cardiomyopathies: Secondary | ICD-10-CM

## 2020-06-24 LAB — CUP PACEART REMOTE DEVICE CHECK
Battery Remaining Longevity: 38 mo
Battery Remaining Percentage: 44 %
Battery Voltage: 2.86 V
Brady Statistic AP VP Percent: 98 %
Brady Statistic AP VS Percent: 1 %
Brady Statistic AS VP Percent: 1 %
Brady Statistic AS VS Percent: 1 %
Brady Statistic RA Percent Paced: 97 %
Date Time Interrogation Session: 20220613020013
Implantable Lead Implant Date: 20160310
Implantable Lead Implant Date: 20160310
Implantable Lead Implant Date: 20160310
Implantable Lead Location: 753858
Implantable Lead Location: 753859
Implantable Lead Location: 753860
Implantable Lead Model: 1948
Implantable Pulse Generator Implant Date: 20160310
Lead Channel Impedance Value: 410 Ohm
Lead Channel Impedance Value: 560 Ohm
Lead Channel Impedance Value: 700 Ohm
Lead Channel Pacing Threshold Amplitude: 0.75 V
Lead Channel Pacing Threshold Amplitude: 0.75 V
Lead Channel Pacing Threshold Amplitude: 0.75 V
Lead Channel Pacing Threshold Pulse Width: 0.5 ms
Lead Channel Pacing Threshold Pulse Width: 0.5 ms
Lead Channel Pacing Threshold Pulse Width: 0.5 ms
Lead Channel Sensing Intrinsic Amplitude: 12 mV
Lead Channel Sensing Intrinsic Amplitude: 2.8 mV
Lead Channel Setting Pacing Amplitude: 2 V
Lead Channel Setting Pacing Amplitude: 2.5 V
Lead Channel Setting Pacing Amplitude: 2.5 V
Lead Channel Setting Pacing Pulse Width: 0.5 ms
Lead Channel Setting Pacing Pulse Width: 0.5 ms
Lead Channel Setting Sensing Sensitivity: 2 mV
Pulse Gen Model: 3242
Pulse Gen Serial Number: 7724805

## 2020-06-25 ENCOUNTER — Telehealth: Payer: Self-pay | Admitting: Emergency Medicine
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
## 2020-07-05 ENCOUNTER — Ambulatory Visit (INDEPENDENT_AMBULATORY_CARE_PROVIDER_SITE_OTHER): Payer: Medicare Other

## 2020-07-05 DIAGNOSIS — Z95810 Presence of automatic (implantable) cardiac defibrillator: Secondary | ICD-10-CM

## 2020-07-05 DIAGNOSIS — I5022 Chronic systolic (congestive) heart failure: Secondary | ICD-10-CM | POA: Diagnosis not present

## 2020-07-06 NOTE — Progress Notes (Signed)
EPIC Encounter for ICM Monitoring  Patient Name: Phillip Booth is a 70 y.o. male Date: 07/06/2020 Primary Care Physican: Wynelle Fanny, DO Primary Cardiologist: Marlou Porch Electrophysiologist: Allred Bi-V Pacing:  >99%      05/10/2020 Office Weight: 222-224 lbs    AT/AF Burden: <1% (Paroxysmal atrial fibrillation - no OAC)     Spoke with patient and heart failure questions reviewed.  Pt asymptomatic for fluid accumulation and feeing well.   Corvue thoracic impedance suggesting normal fluid levels.     Prescribed: Furosemide 20 mg 1 tablet daily   Labs: 12/29/2018 Creatinine 0.93, BUN 15, Potassium 4.2, Sodium 139, GFR 97.09 08/20/2018 Creatinine 0.92, BUN 10, Potassium 4.3, Sodium 137, GFR 98.39   Recommendations: No changes and encouraged to call if experiencing any fluid symptoms.   Follow-up plan: ICM clinic phone appointment on 08/09/2020.   91 day device clinic remote transmission 09/20/2020.     EP/Cardiology Office Visits:  Recall 06/04/2021 with Dr Marlou Porch.  Recalls for 09/13/2019 with Dr. Rayann Heman for 6 month f/u.    Copy of ICM check sent to Dr. Rayann Heman.   3 month ICM trend: 07/05/2020.    1 Year ICM trend:       Rosalene Billings, RN 07/06/2020 10:13 AM

## 2020-07-08 ENCOUNTER — Telehealth: Payer: Self-pay

## 2020-07-08 NOTE — Telephone Encounter (Signed)
2nd attempt to contact patient. No answer, LMOVM.

## 2020-07-08 NOTE — Telephone Encounter (Signed)
Error

## 2020-07-13 NOTE — Progress Notes (Signed)
Remote pacemaker transmission.   

## 2020-07-19 NOTE — Telephone Encounter (Signed)
Spoke with patient.  He was aware of episode occurring.  States he recall feeling his heart racing.  He denies any other cardiac symptoms.  He thought it might have to do with taking his medications late on that day.  Pt denies any current cardiac issues.     Advised I would forward to MD for review, anticipate continued monitoring.

## 2020-08-09 ENCOUNTER — Ambulatory Visit (INDEPENDENT_AMBULATORY_CARE_PROVIDER_SITE_OTHER): Payer: Medicare Other

## 2020-08-09 DIAGNOSIS — Z95810 Presence of automatic (implantable) cardiac defibrillator: Secondary | ICD-10-CM

## 2020-08-09 DIAGNOSIS — I5022 Chronic systolic (congestive) heart failure: Secondary | ICD-10-CM | POA: Diagnosis not present

## 2020-08-13 NOTE — Progress Notes (Signed)
EPIC Encounter for ICM Monitoring  Patient Name: Phillip Booth is a 70 y.o. male Date: 08/13/2020 Primary Care Physican: Wynelle Fanny, DO Primary Cardiologist: Marlou Porch Electrophysiologist: Allred Bi-V Pacing:  >99%      06/09/2020 Office Weight: 215 lbs    AT/AF Burden: <1% (Paroxysmal atrial fibrillation - no OAC) Battery Longevity: 9.7 months    Spoke with patient and heart failure questions reviewed.  Pt asymptomatic for fluid accumulation and feeing well.   Corvue thoracic impedance suggesting normal fluid levels.     Prescribed: Furosemide 20 mg 1 tablet daily   Labs: 07/22/2019 Creatinine 0.83, BUN 14, Potassium 4.2, Sodium 138, GFR >60 A complete set of results can be found in Results Review.   Recommendations: No changes and encouraged to call if experiencing any fluid symptoms.   Follow-up plan: ICM clinic phone appointment on 09/21/2020.   91 day device clinic remote transmission 09/20/2020.     EP/Cardiology Office Visits:  Recall 06/04/2021 with Dr Marlou Porch.  Message sent to EP scheduler 8/5 to contact patient for overdue for appt with Dr Rayann Heman.  Last visit with Oda Kilts, PA was  09/19/2018.    Copy of ICM check sent to Dr. Rayann Heman.    3 month ICM trend: 08/09/2020.    1 Year ICM trend:       Rosalene Billings, RN 08/13/2020 11:01 AM

## 2020-08-23 ENCOUNTER — Encounter: Payer: Self-pay | Admitting: Internal Medicine

## 2020-09-20 ENCOUNTER — Ambulatory Visit (INDEPENDENT_AMBULATORY_CARE_PROVIDER_SITE_OTHER): Payer: Medicare Other

## 2020-09-20 DIAGNOSIS — I441 Atrioventricular block, second degree: Secondary | ICD-10-CM

## 2020-09-21 ENCOUNTER — Ambulatory Visit (INDEPENDENT_AMBULATORY_CARE_PROVIDER_SITE_OTHER): Payer: Medicare Other

## 2020-09-21 DIAGNOSIS — Z95810 Presence of automatic (implantable) cardiac defibrillator: Secondary | ICD-10-CM | POA: Diagnosis not present

## 2020-09-21 DIAGNOSIS — I5022 Chronic systolic (congestive) heart failure: Secondary | ICD-10-CM | POA: Diagnosis not present

## 2020-09-21 LAB — CUP PACEART REMOTE DEVICE CHECK
Battery Remaining Longevity: 8 mo
Battery Remaining Percentage: 10 %
Battery Voltage: 2.84 V
Brady Statistic AP VP Percent: 98 %
Brady Statistic AP VS Percent: 1 %
Brady Statistic AS VP Percent: 1 %
Brady Statistic AS VS Percent: 1 %
Brady Statistic RA Percent Paced: 97 %
Date Time Interrogation Session: 20220912030121
Implantable Lead Implant Date: 20160310
Implantable Lead Implant Date: 20160310
Implantable Lead Implant Date: 20160310
Implantable Lead Location: 753858
Implantable Lead Location: 753859
Implantable Lead Location: 753860
Implantable Lead Model: 1948
Implantable Pulse Generator Implant Date: 20160310
Lead Channel Impedance Value: 430 Ohm
Lead Channel Impedance Value: 550 Ohm
Lead Channel Impedance Value: 700 Ohm
Lead Channel Pacing Threshold Amplitude: 0.75 V
Lead Channel Pacing Threshold Amplitude: 0.75 V
Lead Channel Pacing Threshold Amplitude: 0.75 V
Lead Channel Pacing Threshold Pulse Width: 0.5 ms
Lead Channel Pacing Threshold Pulse Width: 0.5 ms
Lead Channel Pacing Threshold Pulse Width: 0.5 ms
Lead Channel Sensing Intrinsic Amplitude: 12 mV
Lead Channel Sensing Intrinsic Amplitude: 2.7 mV
Lead Channel Setting Pacing Amplitude: 2 V
Lead Channel Setting Pacing Amplitude: 2.5 V
Lead Channel Setting Pacing Amplitude: 2.5 V
Lead Channel Setting Pacing Pulse Width: 0.5 ms
Lead Channel Setting Pacing Pulse Width: 0.5 ms
Lead Channel Setting Sensing Sensitivity: 2 mV
Pulse Gen Model: 3242
Pulse Gen Serial Number: 7724805

## 2020-09-24 NOTE — Progress Notes (Signed)
EPIC Encounter for ICM Monitoring  Patient Name: Phillip Booth is a 70 y.o. male Date: 09/24/2020 Primary Care Physican: Wynelle Fanny, DO Primary Cardiologist: Marlou Porch Electrophysiologist: Allred Bi-V Pacing:  >99%      06/09/2020 Office Weight: 215 lbs    AT/AF Burden: <1% (Paroxysmal atrial fibrillation - no OAC) Battery Longevity: 7.8 months    Spoke with patient and heart failure questions reviewed.  Pt asymptomatic for fluid accumulation and feeing well.   Corvue thoracic impedance suggesting normal fluid levels.     Prescribed: Furosemide 20 mg 1 tablet daily   Labs: 07/22/2019 Creatinine 0.83, BUN 14, Potassium 4.2, Sodium 138, GFR >60 A complete set of results can be found in Results Review.   Recommendations: No changes and encouraged to call if experiencing any fluid symptoms.   Follow-up plan: ICM clinic phone appointment on 10/25/2020.   91 day device clinic remote transmission 12/20/2020.     EP/Cardiology Office Visits:  Recall 06/04/2021 with Dr Marlou Porch.  09/27/2020 with Dr Rayann Heman.    Copy of ICM check sent to Dr. Rayann Heman.     3 month ICM trend: 09/21/2020.    1 Year ICM trend:       Rosalene Billings, RN 09/24/2020 1:46 PM

## 2020-09-24 NOTE — Progress Notes (Signed)
Remote pacemaker transmission.   

## 2020-09-27 ENCOUNTER — Other Ambulatory Visit: Payer: Self-pay

## 2020-09-27 ENCOUNTER — Ambulatory Visit (INDEPENDENT_AMBULATORY_CARE_PROVIDER_SITE_OTHER): Payer: Medicare Other | Admitting: Internal Medicine

## 2020-09-27 VITALS — BP 122/60 | HR 2 | Ht 76.0 in | Wt 221.0 lb

## 2020-09-27 DIAGNOSIS — I10 Essential (primary) hypertension: Secondary | ICD-10-CM | POA: Diagnosis not present

## 2020-09-27 DIAGNOSIS — I441 Atrioventricular block, second degree: Secondary | ICD-10-CM

## 2020-09-27 DIAGNOSIS — I471 Supraventricular tachycardia: Secondary | ICD-10-CM

## 2020-09-27 DIAGNOSIS — I428 Other cardiomyopathies: Secondary | ICD-10-CM | POA: Diagnosis not present

## 2020-09-27 NOTE — Progress Notes (Signed)
PCP: Wynelle Fanny, DO Primary Cardiologist: Dr Marlou Porch Primary EP: Dr Lenon Oms Phillip Booth is a 70 y.o. male who presents today for routine electrophysiology followup.  Since last being seen in our clinic, the patient reports doing very well.  Today, he denies symptoms of palpitations, chest pain, shortness of breath,  lower extremity edema, dizziness, presyncope, syncope, or ICD shocks.  The patient is otherwise without complaint today.   Past Medical History:  Diagnosis Date   Allergy    CAD (coronary artery disease)    a. moderate by cath 03/2014   Cataract    Cervical strain 03/25/2020   CVA (cerebral infarction)    a. diagnosis not clear   Hyperlipidemia    Hypertension    Non-ischemic cardiomyopathy ([place])    a. EF 45% by echo 03/2014   Paroxysmal atrial fibrillation ([place]) 05/01/2014   asymptomatic, documented on PPM interrogation,  chads2vasc score is at least 6.  He is contemplating anticoagulation   Symptomatic bradycardia    a. s/p STJ CRTP implanted by Dr Rayann Heman   Vertigo    Past Surgical History:  Procedure Laterality Date   ABDOMINAL SURGERY     APPENDECTOMY     BI-VENTRICULAR PACEMAKER INSERTION N/A 03/19/2014   STJ CRTP implanted by Dr Rayann Heman   EYE SURGERY     Cataract removed from Right eye   [place]  WITH CORONARY ANGIOGRAM N/A 03/19/2014   Procedure: LEFT HEART CATHETERIZATION WITH CORONARY ANGIOGRAM;  Surgeon: [name], MD;  Location[facility] CATH LAB;  Service: Cardiovascular;  Laterality: N/A;    ROS- all systems are reviewed and negative except as per HPI above  Current Outpatient Medications  Medication Sig Dispense Refill   albuterol (VENTOLIN HFA) 108 (90 Base) MCG/ACT inhaler INHALE 2 PUFFS INTO THE LUNGS EVERY 6 (SIX) HOURS AS NEEDED FOR WHEEZING OR SHORTNESS OF BREATH. 18 g 2   aspirin 81 MG tablet Take 81 mg by mouth daily.      azelastine (ASTELIN) 0.1 % nasal spray Place 2 sprays into both nostrils 2 (two)  times daily. Use in each nostril as directed 30 mL 1   carvedilol (COREG) 6.25 MG tablet TAKE 1 TABLET BY MOUTH TWICE DAILY WITH A MEAL 180 tablet 0   diclofenac Sodium (VOLTAREN) 1 % GEL Apply 4 g topically 4 (four) times daily. 100 g 0   fluticasone (FLONASE) 50 MCG/ACT nasal spray Place 2 sprays into both nostrils daily. 16 g 2   fluticasone (FLOVENT HFA) 110 MCG/ACT inhaler Inhale 2 puffs into the lungs 2 (two) times daily. 1 Inhaler 3   furosemide (LASIX) 20 MG tablet TAKE 1 TABLET BY MOUTH DAILY 90 tablet 2   isosorbide mononitrate (IMDUR) 30 MG 24 hr tablet TAKE 1 TABLET (30 MG TOTAL) BY MOUTH DAILY. 30 tablet 5   ketorolac (ACULAR) 0.5 % ophthalmic solution      levocetirizine (XYZAL) 5 MG tablet Take 1 tablet (5 mg total) by mouth every evening. 30 tablet 3   lisinopril (ZESTRIL) 40 MG tablet TAKE 1 TABLET (40 MG TOTAL) BY MOUTH DAILY. 30 tablet 3   metFORMIN (GLUCOPHAGE-XR) 500 MG 24 hr tablet Take 500 mg by mouth daily.     montelukast (SINGULAIR) 10 MG tablet Take 1 tablet (10 mg total) by mouth at bedtime. 30 tablet 3   rosuvastatin (CRESTOR) 10 MG tablet Take 1 tablet (10 mg total) by mouth daily. 30 tablet 11   amLODipine (NORVASC)  10 MG tablet Take 1 tablet (10 mg total) by mouth daily. 90 tablet 3   No current facility-administered medications for this visit.    Physical Exam: Vitals:   09/27/20 1600  BP: 122/60  Pulse: (!) 2  SpO2: 94%  Weight: 221 lb (100.2 kg)  Height: '6\' 4"'$  (1.93 m)    GEN- The patient is well appearing, alert and oriented x 3 today.   Head- normocephalic, atraumatic Eyes-  Sclera clear, conjunctiva pink Ears- hearing intact Oropharynx- clear Lungs- Clear to ausculation bilaterally, normal work of breathing Chest- PPM pocket is well healed Heart- Regular rate and rhythm, no murmurs, rubs or gallops, PMI not laterally displaced GI- soft, NT, ND, + BS Extremities- no clubbing, cyanosis, or edema  PPM interrogation- reviewed in detail today,   See PACEART report  ekg tracing ordered today is personally reviewed and shows AV paced  Wt Readings from Last 3 Encounters:  09/27/20 221 lb (100.2 kg)  06/09/20 215 lb (97.5 kg)  05/10/20 225 lb (102.1 kg)    Assessment and Plan:  1.  Chronic systolic dysfunction/ nonischemic euvolemic today Echo 09/2018 reveals EF 45% Stable on an appropriate medical regimen Normal CRT-P function See Pace Art report No changes today he is device dependant today followed in ICM device clinic Repeat echo Bmet, BNP today  2. Complete AV block Normal BiV pacemaker function Approaching ERI (7 months estimated) Risks, benefits, and alternatives to CRT-P pacemaker pulse generator replacement were discussed in detail today.  The patient understands that risks include but are not limited to bleeding, infection, pneumothorax, perforation, tamponade, vascular damage, renal failure, MI, stroke, death, damage to his existing leads, and lead dislodgement and wishes to proceed once he reaches ERI without an additional office visit required  3. HTN Stable No change required today    Risks, benefits and potential toxicities for medications prescribed and/or refilled reviewed with patient today.   [name] MD, [facility] 09/27/2020 4:11 PM

## 2020-09-27 NOTE — Patient Instructions (Addendum)
Medication Instructions:  Your physician recommends that you continue on your current medications as directed. Please refer to the Current Medication list given to you today.  Labwork: BMP, Pro BNP  Testing/Procedures: Your physician has requested that you have an echocardiogram. Echocardiography is a painless test that uses sound waves to create images of your heart. It provides your doctor with information about the size and shape of your heart and how well your heart's chambers and valves are working. This procedure takes approximately one hour. There are no restrictions for this procedure  Follow-Up: Your physician wants you to follow-up in: 12 months with  one of the following Advanced Practice Providers on your designated Care Team:     Legrand Como "Jonni Sanger" Chalmers Cater, [place]   You will receive a reminder letter in the mail two months in advance. If you don't receive a letter, please call our office to schedule the follow-up appointment.  Remote monitoring is used to monitor your Pacemaker from home. This monitoring reduces the number of office visits required to check your device to one time per year. It allows [name] to keep an eye on the functioning of your device to ensure it is working properly. You are scheduled for a device check from home on 10/27/20. You may send your transmission at any time that day. If you have a wireless device, the transmission will be sent automatically. After your physician reviews your transmission, you will receive a postcard with your next transmission date.  Any Other Special Instructions Will Be Listed Below (If Applicable).  If you need a refill on your cardiac medications before your next appointment, please call your pharmacy.

## 2020-09-28 LAB — BASIC METABOLIC PANEL
BUN/Creatinine Ratio: 14 (ref 10–24)
BUN: 13 mg/dL (ref 8–27)
CO2: 23 mmol/L (ref 20–29)
Calcium: 9.3 mg/dL (ref 8.6–10.2)
Chloride: 105 mmol/L (ref 96–106)
Creatinine, Ser: 0.94 mg/dL (ref 0.76–1.27)
Glucose: 90 mg/dL (ref 65–99)
Potassium: 4 mmol/L (ref 3.5–5.2)
Sodium: 141 mmol/L (ref 134–144)
eGFR: 86 mL/min/{1.73_m2} (ref >59)

## 2020-09-28 LAB — PRO B NATRIURETIC PEPTIDE: NT-Pro BNP: 178 pg/mL (ref 0–376)

## 2020-10-15 ENCOUNTER — Other Ambulatory Visit: Payer: Self-pay

## 2020-10-15 ENCOUNTER — Ambulatory Visit (HOSPITAL_COMMUNITY): Payer: Medicare Other | Attending: Cardiovascular Disease

## 2020-10-15 DIAGNOSIS — I441 Atrioventricular block, second degree: Secondary | ICD-10-CM

## 2020-10-15 DIAGNOSIS — I10 Essential (primary) hypertension: Secondary | ICD-10-CM

## 2020-10-15 DIAGNOSIS — I428 Other cardiomyopathies: Secondary | ICD-10-CM

## 2020-10-15 DIAGNOSIS — I471 Supraventricular tachycardia: Secondary | ICD-10-CM | POA: Insufficient documentation

## 2020-10-15 DIAGNOSIS — I4719 Other supraventricular tachycardia: Secondary | ICD-10-CM

## 2020-10-15 LAB — ECHOCARDIOGRAM COMPLETE
Area-P 1/2: 2.83 cm2
P 1/2 time: 601 msec
S' Lateral: 3.8 cm

## 2020-10-25 ENCOUNTER — Ambulatory Visit (INDEPENDENT_AMBULATORY_CARE_PROVIDER_SITE_OTHER): Payer: Medicare Other

## 2020-10-25 DIAGNOSIS — Z95810 Presence of automatic (implantable) cardiac defibrillator: Secondary | ICD-10-CM | POA: Diagnosis not present

## 2020-10-25 DIAGNOSIS — I5022 Chronic systolic (congestive) heart failure: Secondary | ICD-10-CM | POA: Diagnosis not present

## 2020-10-27 ENCOUNTER — Telehealth: Payer: Self-pay

## 2020-10-27 NOTE — Progress Notes (Signed)
EPIC Encounter for ICM Monitoring  Patient Name: Phillip Booth is a 70 y.o. male Date: 10/27/2020 Primary Care Physican: Wynelle Fanny, DO Primary Cardiologist: Marlou Porch Electrophysiologist: Allred Bi-V Pacing:  >99%      06/09/2020 Office Weight: 215 lbs    AT/AF Burden: <1% (Paroxysmal atrial fibrillation - no OAC) Battery Longevity: 7.8 months    Attempted call to patient and unable to reach.  Left detailed message per DPR regarding transmission. Transmission reviewed.    Corvue thoracic impedance suggesting normal fluid levels.     Prescribed: Furosemide 20 mg 1 tablet daily   Labs: 09/30/2020 Creatinine 0.94, BUN 13, Poassium 4.0, Sodium 4.0, GFR 86 A complete set of results can be found in Results Review.   Recommendations: Left voice mail with ICM number and encouraged to call if experiencing any fluid symptoms.   Follow-up plan: ICM clinic phone appointment on 12/06/2020.   91 day device clinic remote transmission 12/20/2020.     EP/Cardiology Office Visits:  Recall 06/04/2021 with Dr Marlou Porch.    Copy of ICM check sent to Dr. Rayann Heman.     3 month ICM trend: 10/25/2020.    1 Year ICM trend:       Rosalene Billings, RN 10/27/2020 10:19 AM

## 2020-10-27 NOTE — Telephone Encounter (Signed)
Remote ICM transmission received.  Attempted call to patient regarding ICM remote transmission and left detailed message per DPR.  Advised to return call for any fluid symptoms or questions. Next ICM remote transmission scheduled 12/06/2020.    

## 2020-11-29 ENCOUNTER — Ambulatory Visit (INDEPENDENT_AMBULATORY_CARE_PROVIDER_SITE_OTHER): Payer: Medicare Other

## 2020-11-29 DIAGNOSIS — I428 Other cardiomyopathies: Secondary | ICD-10-CM

## 2020-11-29 LAB — CUP PACEART REMOTE DEVICE CHECK
Battery Remaining Longevity: 8 mo
Battery Remaining Percentage: 9 %
Battery Voltage: 2.84 V
Brady Statistic AP VP Percent: 99 %
Brady Statistic AP VS Percent: 1 %
Brady Statistic AS VP Percent: 1 %
Brady Statistic AS VS Percent: 1 %
Brady Statistic RA Percent Paced: 98 %
Date Time Interrogation Session: 20221119020013
Implantable Lead Implant Date: 20160310
Implantable Lead Implant Date: 20160310
Implantable Lead Implant Date: 20160310
Implantable Lead Location: 753858
Implantable Lead Location: 753859
Implantable Lead Location: 753860
Implantable Lead Model: 1948
Implantable Pulse Generator Implant Date: 20160310
Lead Channel Impedance Value: 400 Ohm
Lead Channel Impedance Value: 560 Ohm
Lead Channel Impedance Value: 700 Ohm
Lead Channel Pacing Threshold Amplitude: 0.5 V
Lead Channel Pacing Threshold Amplitude: 0.75 V
Lead Channel Pacing Threshold Amplitude: 0.75 V
Lead Channel Pacing Threshold Pulse Width: 0.5 ms
Lead Channel Pacing Threshold Pulse Width: 0.5 ms
Lead Channel Pacing Threshold Pulse Width: 0.5 ms
Lead Channel Sensing Intrinsic Amplitude: 12 mV
Lead Channel Sensing Intrinsic Amplitude: 2.7 mV
Lead Channel Setting Pacing Amplitude: 2 V
Lead Channel Setting Pacing Amplitude: 2.5 V
Lead Channel Setting Pacing Amplitude: 2.5 V
Lead Channel Setting Pacing Pulse Width: 0.5 ms
Lead Channel Setting Pacing Pulse Width: 0.5 ms
Lead Channel Setting Sensing Sensitivity: 2 mV
Pulse Gen Model: 3242
Pulse Gen Serial Number: 7724805

## 2020-12-06 ENCOUNTER — Ambulatory Visit (INDEPENDENT_AMBULATORY_CARE_PROVIDER_SITE_OTHER): Payer: Medicare Other

## 2020-12-06 DIAGNOSIS — Z95810 Presence of automatic (implantable) cardiac defibrillator: Secondary | ICD-10-CM

## 2020-12-06 DIAGNOSIS — I5022 Chronic systolic (congestive) heart failure: Secondary | ICD-10-CM | POA: Diagnosis not present

## 2020-12-07 NOTE — Progress Notes (Signed)
Remote pacemaker transmission.   

## 2020-12-08 ENCOUNTER — Telehealth: Payer: Self-pay

## 2020-12-08 NOTE — Telephone Encounter (Signed)
Remote ICM transmission received.  Attempted call to patient regarding ICM remote transmission and left detailed message per DPR.  Advised to return call for any fluid symptoms or questions. Next ICM remote transmission scheduled 01/11/2021.

## 2020-12-08 NOTE — Progress Notes (Signed)
EPIC Encounter for ICM Monitoring  Patient Name: Phillip Booth is a 70 y.o. male Date: 12/08/2020 Primary Care Physican: Wynelle Fanny, DO Primary Cardiologist: Marlou Porch Electrophysiologist: Allred Bi-V Pacing:  >99%      06/09/2020 Office Weight: 215 lbs    AT/AF Burden: 0% (no OAC) Battery Longevity: 6.9 months    Attempted call to patient and unable to reach.  Left detailed message per DPR regarding transmission. Transmission reviewed.    Corvue thoracic impedance suggesting normal fluid levels.     Prescribed: Furosemide 20 mg 1 tablet daily   Labs: 09/30/2020 Creatinine 0.94, BUN 13, Poassium 4.0, Sodium 4.0, GFR 86 A complete set of results can be found in Results Review.   Recommendations: Left voice mail with ICM number and encouraged to call if experiencing any fluid symptoms.   Follow-up plan: ICM clinic phone appointment on 01/11/2021.   91 day device clinic remote transmission 12/20/2020.     EP/Cardiology Office Visits:  Recall 06/04/2021 with Dr Marlou Porch.    Copy of ICM check sent to Dr. Rayann Heman.      3 month ICM trend: 12/06/2020.    12-14 Month ICM trend:       Rosalene Billings, RN 12/08/2020 9:35 AM

## 2020-12-28 LAB — CUP PACEART REMOTE DEVICE CHECK
Battery Remaining Longevity: 7 mo
Battery Remaining Percentage: 8 %
Battery Voltage: 2.83 V
Brady Statistic AP VP Percent: 99 %
Brady Statistic AP VS Percent: 1 %
Brady Statistic AS VP Percent: 1 %
Brady Statistic AS VS Percent: 1 %
Brady Statistic RA Percent Paced: 99 %
Date Time Interrogation Session: 20221220020015
Implantable Lead Implant Date: 20160310
Implantable Lead Implant Date: 20160310
Implantable Lead Implant Date: 20160310
Implantable Lead Location: 753858
Implantable Lead Location: 753859
Implantable Lead Location: 753860
Implantable Lead Model: 1948
Implantable Pulse Generator Implant Date: 20160310
Lead Channel Impedance Value: 430 Ohm
Lead Channel Impedance Value: 550 Ohm
Lead Channel Impedance Value: 730 Ohm
Lead Channel Pacing Threshold Amplitude: 0.5 V
Lead Channel Pacing Threshold Amplitude: 0.75 V
Lead Channel Pacing Threshold Amplitude: 0.75 V
Lead Channel Pacing Threshold Pulse Width: 0.5 ms
Lead Channel Pacing Threshold Pulse Width: 0.5 ms
Lead Channel Pacing Threshold Pulse Width: 0.5 ms
Lead Channel Sensing Intrinsic Amplitude: 12 mV
Lead Channel Sensing Intrinsic Amplitude: 2.4 mV
Lead Channel Setting Pacing Amplitude: 2 V
Lead Channel Setting Pacing Amplitude: 2.5 V
Lead Channel Setting Pacing Amplitude: 2.5 V
Lead Channel Setting Pacing Pulse Width: 0.5 ms
Lead Channel Setting Pacing Pulse Width: 0.5 ms
Lead Channel Setting Sensing Sensitivity: 2 mV
Pulse Gen Model: 3242
Pulse Gen Serial Number: 7724805

## 2020-12-30 ENCOUNTER — Ambulatory Visit (INDEPENDENT_AMBULATORY_CARE_PROVIDER_SITE_OTHER): Payer: Medicare Other

## 2020-12-30 DIAGNOSIS — I441 Atrioventricular block, second degree: Secondary | ICD-10-CM

## 2021-01-07 NOTE — Progress Notes (Signed)
Remote pacemaker transmission.   

## 2021-01-11 ENCOUNTER — Ambulatory Visit (INDEPENDENT_AMBULATORY_CARE_PROVIDER_SITE_OTHER): Payer: Medicare Other

## 2021-01-11 DIAGNOSIS — Z95810 Presence of automatic (implantable) cardiac defibrillator: Secondary | ICD-10-CM | POA: Diagnosis not present

## 2021-01-11 DIAGNOSIS — I5022 Chronic systolic (congestive) heart failure: Secondary | ICD-10-CM

## 2021-01-14 NOTE — Progress Notes (Signed)
EPIC Encounter for ICM Monitoring  Patient Name: Phillip Booth is a 71 y.o. male Date: 01/14/2021 Primary Care Physican: Wynelle Fanny, DO Primary Cardiologist: Marlou Porch Electrophysiologist: Allred Bi-V Pacing:  >99%      01/14/2021 Weight: 216 lbs    AT/AF Burden: 0% (no OAC) Battery Longevity: 6.8 months    Spoke with patient and heart failure questions reviewed.  Pt asymptomatic for fluid accumulation.  Reports feeling reasonably well at this time and voices no complaints.    Corvue thoracic impedance suggesting normal fluid levels.     Prescribed: Furosemide 20 mg 1 tablet daily   Labs: 09/27/2020 Creatinine 0.94, BUN 13, Poassium 4.0, Sodium 4.0, GFR 86 A complete set of results can be found in Results Review.   Recommendations: No changes and encouraged to call if experiencing any fluid symptoms.   Follow-up plan: ICM clinic phone appointment on 02/14/2021.   91 day device clinic remote transmission 04/04/2021.     EP/Cardiology Office Visits:  Recall 06/04/2021 with Dr Marlou Porch.    Copy of ICM check sent to Dr. Rayann Heman.      3 month ICM trend: 01/11/2021.    12-14 Month ICM trend:     Rosalene Billings, RN 01/14/2021 9:29 AM

## 2021-01-31 ENCOUNTER — Ambulatory Visit (INDEPENDENT_AMBULATORY_CARE_PROVIDER_SITE_OTHER): Payer: Medicare Other

## 2021-01-31 DIAGNOSIS — I428 Other cardiomyopathies: Secondary | ICD-10-CM

## 2021-01-31 LAB — CUP PACEART REMOTE DEVICE CHECK
Battery Remaining Longevity: 7 mo
Battery Remaining Percentage: 8 %
Battery Voltage: 2.83 V
Brady Statistic AP VP Percent: 99 %
Brady Statistic AP VS Percent: 1 %
Brady Statistic AS VP Percent: 1 %
Brady Statistic AS VS Percent: 1 %
Brady Statistic RA Percent Paced: 99 %
Date Time Interrogation Session: 20230123020023
Implantable Lead Implant Date: 20160310
Implantable Lead Implant Date: 20160310
Implantable Lead Implant Date: 20160310
Implantable Lead Location: 753858
Implantable Lead Location: 753859
Implantable Lead Location: 753860
Implantable Lead Model: 1948
Implantable Pulse Generator Implant Date: 20160310
Lead Channel Impedance Value: 410 Ohm
Lead Channel Impedance Value: 550 Ohm
Lead Channel Impedance Value: 730 Ohm
Lead Channel Pacing Threshold Amplitude: 0.5 V
Lead Channel Pacing Threshold Amplitude: 0.75 V
Lead Channel Pacing Threshold Amplitude: 0.75 V
Lead Channel Pacing Threshold Pulse Width: 0.5 ms
Lead Channel Pacing Threshold Pulse Width: 0.5 ms
Lead Channel Pacing Threshold Pulse Width: 0.5 ms
Lead Channel Sensing Intrinsic Amplitude: 12 mV
Lead Channel Sensing Intrinsic Amplitude: 2.7 mV
Lead Channel Setting Pacing Amplitude: 2 V
Lead Channel Setting Pacing Amplitude: 2.5 V
Lead Channel Setting Pacing Amplitude: 2.5 V
Lead Channel Setting Pacing Pulse Width: 0.5 ms
Lead Channel Setting Pacing Pulse Width: 0.5 ms
Lead Channel Setting Sensing Sensitivity: 2 mV
Pulse Gen Model: 3242
Pulse Gen Serial Number: 7724805

## 2021-02-10 NOTE — Progress Notes (Signed)
Remote pacemaker transmission.   

## 2021-02-14 ENCOUNTER — Ambulatory Visit (INDEPENDENT_AMBULATORY_CARE_PROVIDER_SITE_OTHER): Payer: Medicare Other

## 2021-02-14 DIAGNOSIS — I5022 Chronic systolic (congestive) heart failure: Secondary | ICD-10-CM

## 2021-02-14 DIAGNOSIS — Z95810 Presence of automatic (implantable) cardiac defibrillator: Secondary | ICD-10-CM | POA: Diagnosis not present

## 2021-02-15 NOTE — Progress Notes (Signed)
EPIC Encounter for ICM Monitoring  Patient Name: Phillip Booth is a 71 y.o. male Date: 02/15/2021 Primary Care Physican: Wynelle Fanny, DO Primary Cardiologist: Marlou Porch Electrophysiologist: Allred Bi-V Pacing:  >99%      01/14/2021 Weight: 216 lbs    AT/AF Burden: 0% (no OAC) Battery Longevity: 5.9 months    Spoke with patient and heart failure questions reviewed.  Pt asymptomatic for fluid accumulation.  He reports feeling some shoulder pain and some pain down both arms.  He is unsure if the pain is muscular.     Corvue thoracic impedance suggesting normal fluid levels.     Prescribed: Furosemide 20 mg 1 tablet daily   Labs: 09/27/2020 Creatinine 0.94, BUN 13, Poassium 4.0, Sodium 4.0, GFR 86 A complete set of results can be found in Results Review.   Recommendations:  Advised to use 911 or go to ER if he continues to have any pain or condition changes.   No changes and encouraged to call if experiencing any fluid symptoms.   Follow-up plan: ICM clinic phone appointment on 03/21/2021.   91 day device clinic remote transmission 04/04/2021.     EP/Cardiology Office Visits:  Recall 06/04/2021 with Dr Marlou Porch.    Copy of ICM check sent to Dr. Rayann Heman.      3 month ICM trend: 02/14/2021.    12-14 Month ICM trend:     Rosalene Billings, RN 02/15/2021 5:03 PM

## 2021-03-03 ENCOUNTER — Ambulatory Visit (INDEPENDENT_AMBULATORY_CARE_PROVIDER_SITE_OTHER): Payer: Medicare Other

## 2021-03-03 DIAGNOSIS — I441 Atrioventricular block, second degree: Secondary | ICD-10-CM

## 2021-03-04 LAB — CUP PACEART REMOTE DEVICE CHECK
Battery Remaining Longevity: 6 mo
Battery Remaining Percentage: 7 %
Battery Voltage: 2.81 V
Brady Statistic AP VP Percent: 99 %
Brady Statistic AP VS Percent: 1 %
Brady Statistic AS VP Percent: 1 %
Brady Statistic AS VS Percent: 1 %
Brady Statistic RA Percent Paced: 99 %
Date Time Interrogation Session: 20230224072201
Implantable Lead Implant Date: 20160310
Implantable Lead Implant Date: 20160310
Implantable Lead Implant Date: 20160310
Implantable Lead Location: 753858
Implantable Lead Location: 753859
Implantable Lead Location: 753860
Implantable Lead Model: 1948
Implantable Pulse Generator Implant Date: 20160310
Lead Channel Impedance Value: 410 Ohm
Lead Channel Impedance Value: 560 Ohm
Lead Channel Impedance Value: 730 Ohm
Lead Channel Pacing Threshold Amplitude: 0.5 V
Lead Channel Pacing Threshold Amplitude: 0.75 V
Lead Channel Pacing Threshold Amplitude: 0.75 V
Lead Channel Pacing Threshold Pulse Width: 0.5 ms
Lead Channel Pacing Threshold Pulse Width: 0.5 ms
Lead Channel Pacing Threshold Pulse Width: 0.5 ms
Lead Channel Sensing Intrinsic Amplitude: 12 mV
Lead Channel Sensing Intrinsic Amplitude: 2.6 mV
Lead Channel Setting Pacing Amplitude: 2 V
Lead Channel Setting Pacing Amplitude: 2.5 V
Lead Channel Setting Pacing Amplitude: 2.5 V
Lead Channel Setting Pacing Pulse Width: 0.5 ms
Lead Channel Setting Pacing Pulse Width: 0.5 ms
Lead Channel Setting Sensing Sensitivity: 2 mV
Pulse Gen Model: 3242
Pulse Gen Serial Number: 7724805

## 2021-03-08 NOTE — Addendum Note (Signed)
Addended by: Cheri Kearns A on: 03/08/2021 01:57 PM   Modules accepted: Level of Service

## 2021-03-08 NOTE — Progress Notes (Signed)
Remote pacemaker transmission.   

## 2021-03-21 ENCOUNTER — Ambulatory Visit (INDEPENDENT_AMBULATORY_CARE_PROVIDER_SITE_OTHER): Payer: Medicare Other

## 2021-03-21 DIAGNOSIS — I5022 Chronic systolic (congestive) heart failure: Secondary | ICD-10-CM

## 2021-03-21 DIAGNOSIS — Z95810 Presence of automatic (implantable) cardiac defibrillator: Secondary | ICD-10-CM

## 2021-03-22 ENCOUNTER — Telehealth: Payer: Self-pay

## 2021-03-22 NOTE — Progress Notes (Signed)
EPIC Encounter for ICM Monitoring ? ?Patient Name: Phillip Booth is a 71 y.o. male ?Date: 03/22/2021 ?Primary Care Physican: Wynelle Fanny, DO ?Primary Cardiologist: Marlou Porch ?Electrophysiologist: Allred ?Bi-V Pacing:  >99%      ?01/14/2021 Weight: 216 lbs  ?  ?AT/AF Burden: 0% (no OAC) ?Battery Longevity: 6 months  ?  ?Attempted call to patient and unable to reach.  Left detailed message per DPR regarding transmission. Transmission reviewed.   ?  ?Corvue thoracic impedance suggesting normal fluid levels.   ?  ?Prescribed: Furosemide 20 mg 1 tablet daily ?  ?Labs: ?09/27/2020 Creatinine 0.94, BUN 13, Poassium 4.0, Sodium 4.0, GFR 86 ?A complete set of results can be found in Results Review. ?  ?Recommendations:  Left voice mail with ICM number and encouraged to call if experiencing any fluid symptoms. ?  ?Follow-up plan: ICM clinic phone appointment on 04/25/2021.   91 day device clinic remote transmission 04/04/2021.   ?  ?EP/Cardiology Office Visits:  Recall 06/04/2021 with Dr Marlou Porch.  ?  ?Copy of ICM check sent to Dr. Rayann Heman.  ? ?3 month ICM trend: 03/21/2021. ? ? ? ?12-14 Month ICM trend:  ? ? ? ?Rosalene Billings, RN ?03/22/2021 ?2:56 PM ? ?

## 2021-03-22 NOTE — Telephone Encounter (Signed)
Remote ICM transmission received.  Attempted call to patient regarding ICM remote transmission and left detailed message per DPR.  Advised to return call for any fluid symptoms or questions. Next ICM remote transmission scheduled 04/25/2021.   ? ?

## 2021-04-04 ENCOUNTER — Ambulatory Visit (INDEPENDENT_AMBULATORY_CARE_PROVIDER_SITE_OTHER): Payer: Medicare Other

## 2021-04-04 DIAGNOSIS — I428 Other cardiomyopathies: Secondary | ICD-10-CM | POA: Diagnosis not present

## 2021-04-05 LAB — CUP PACEART REMOTE DEVICE CHECK
Battery Remaining Longevity: 5 mo
Battery Remaining Percentage: 6 %
Battery Voltage: 2.8 V
Brady Statistic AP VP Percent: 99 %
Brady Statistic AP VS Percent: 1 %
Brady Statistic AS VP Percent: 1 %
Brady Statistic AS VS Percent: 1 %
Brady Statistic RA Percent Paced: 99 %
Date Time Interrogation Session: 20230327143649
Implantable Lead Implant Date: 20160310
Implantable Lead Implant Date: 20160310
Implantable Lead Implant Date: 20160310
Implantable Lead Location: 753858
Implantable Lead Location: 753859
Implantable Lead Location: 753860
Implantable Lead Model: 1948
Implantable Pulse Generator Implant Date: 20160310
Lead Channel Impedance Value: 400 Ohm
Lead Channel Impedance Value: 540 Ohm
Lead Channel Impedance Value: 700 Ohm
Lead Channel Pacing Threshold Amplitude: 0.5 V
Lead Channel Pacing Threshold Amplitude: 0.75 V
Lead Channel Pacing Threshold Amplitude: 0.75 V
Lead Channel Pacing Threshold Pulse Width: 0.5 ms
Lead Channel Pacing Threshold Pulse Width: 0.5 ms
Lead Channel Pacing Threshold Pulse Width: 0.5 ms
Lead Channel Sensing Intrinsic Amplitude: 12 mV
Lead Channel Sensing Intrinsic Amplitude: 2.8 mV
Lead Channel Setting Pacing Amplitude: 2 V
Lead Channel Setting Pacing Amplitude: 2.5 V
Lead Channel Setting Pacing Amplitude: 2.5 V
Lead Channel Setting Pacing Pulse Width: 0.5 ms
Lead Channel Setting Pacing Pulse Width: 0.5 ms
Lead Channel Setting Sensing Sensitivity: 2 mV
Pulse Gen Model: 3242
Pulse Gen Serial Number: 7724805

## 2021-04-13 NOTE — Progress Notes (Signed)
Remote pacemaker transmission.   

## 2021-04-25 ENCOUNTER — Ambulatory Visit (INDEPENDENT_AMBULATORY_CARE_PROVIDER_SITE_OTHER): Payer: Medicare Other

## 2021-04-25 DIAGNOSIS — Z95810 Presence of automatic (implantable) cardiac defibrillator: Secondary | ICD-10-CM

## 2021-04-25 DIAGNOSIS — I5022 Chronic systolic (congestive) heart failure: Secondary | ICD-10-CM

## 2021-04-25 NOTE — Progress Notes (Addendum)
EPIC Encounter for ICM Monitoring ? ?Patient Name: Phillip Booth is a 71 y.o. male ?Date: 04/25/2021 ?Primary Care Physican: Wynelle Fanny, DO ?Primary Cardiologist: Marlou Porch ?Electrophysiologist: Allred ?Bi-V Pacing:  >99%      ?01/14/2021 Weight: 216 lbs  ?  ?AT/AF Burden: 0% (no OAC) ?Battery Longevity: 5.3 months  ?  ?Attempted call to patient and unable to reach.  Left detailed message per DPR regarding transmission. Transmission reviewed.    ?  ?Corvue thoracic impedance suggesting possible fluid accumulation starting 4/16.   ?  ?Prescribed: Furosemide 20 mg 1 tablet daily ?  ?Labs: ?09/27/2020 Creatinine 0.94, BUN 13, Poassium 4.0, Sodium 4.0, GFR 86 ?A complete set of results can be found in Results Review. ?  ?Recommendations:  Left voice mail with ICM number and encouraged to call if experiencing any fluid symptoms. ?  ?Follow-up plan: ICM clinic phone appointment on 05/03/2021 to recheck fluid levels.   91 day device clinic remote transmission 08/08/2021.   ?  ?EP/Cardiology Office Visits:  Recall 06/04/2021 with Dr Marlou Porch.  ?  ?Copy of ICM check sent to Dr. Rayann Heman.  ?  ?3 month ICM trend: 04/25/2021. ? ? ? ?12-14 Month ICM trend:  ? ? ? ?Rosalene Billings, RN ?04/25/2021 ?7:44 AM ? ?

## 2021-04-26 ENCOUNTER — Telehealth: Payer: Self-pay

## 2021-04-26 NOTE — Telephone Encounter (Signed)
Remote ICM transmission received.  Attempted call to patient regarding ICM remote transmission and left detailed message per DPR.  Advised to return call for any fluid symptoms or questions. Next ICM remote transmission scheduled 05/03/2021.   ? ?

## 2021-04-29 NOTE — Progress Notes (Signed)
Spoke with patient and heart failure questions reviewed.  Pt has some swelling in his feet and may be eating foods high in salt.  He will cut back on salt intake and will recheck fluid levels next week.  ?

## 2021-05-03 ENCOUNTER — Ambulatory Visit (INDEPENDENT_AMBULATORY_CARE_PROVIDER_SITE_OTHER): Payer: Medicare Other

## 2021-05-03 DIAGNOSIS — I5022 Chronic systolic (congestive) heart failure: Secondary | ICD-10-CM

## 2021-05-03 DIAGNOSIS — Z95810 Presence of automatic (implantable) cardiac defibrillator: Secondary | ICD-10-CM

## 2021-05-03 NOTE — Progress Notes (Signed)
EPIC Encounter for ICM Monitoring ? ?Patient Name: Phillip Booth is a 71 y.o. male ?Date: 05/03/2021 ?Primary Care Physican: Wynelle Fanny, DO ?Primary Cardiologist: Marlou Porch ?Electrophysiologist: Allred ?Bi-V Pacing:  >99%      ?05/04/2021 Weight: 218 lbs  ?  ?AT/AF Burden: 0% (no OAC) ?Battery Longevity: 5.3 months  ?  ?Spoke with patient and heart failure questions reviewed.  Pt 's weight was up to 220 lbs but has started to return to baseline (216 lbs) and does have some swelling in feet.   ?  ?Corvue thoracic impedance suggesting possible fluid accumulation returned starting 4/24.   ?  ?Prescribed: Furosemide 20 mg 1 tablet daily ?  ?Labs: ?09/27/2020 Creatinine 0.94, BUN 13, Poassium 4.0, Sodium 4.0, GFR 86 ?A complete set of results can be found in Results Review. ?  ?Recommendations:  Advised to take extra Furosemide 20 mg x 1 day and then returned to prescribed dosage.  ?  ?Follow-up plan: ICM clinic phone appointment on 05/10/2021 to recheck fluid levels.   91 day device clinic remote transmission 08/08/2021.   ?  ?EP/Cardiology Office Visits:  Recall 06/04/2021 with Dr Marlou Porch.  ?  ?Copy of ICM check sent to Dr. Rayann Heman and Dr Marlou Porch.  ? ?3 month ICM trend: 05/03/2021. ? ? ? ?12-14 Month ICM trend:  ? ? ? ?Rosalene Billings, RN ?05/03/2021 ?7:39 AM ? ?

## 2021-05-05 ENCOUNTER — Ambulatory Visit (INDEPENDENT_AMBULATORY_CARE_PROVIDER_SITE_OTHER): Payer: Medicare Other

## 2021-05-05 DIAGNOSIS — I441 Atrioventricular block, second degree: Secondary | ICD-10-CM

## 2021-05-11 LAB — CUP PACEART REMOTE DEVICE CHECK
Battery Remaining Longevity: 5 mo
Battery Remaining Percentage: 6 %
Battery Voltage: 2.78 V
Brady Statistic AP VP Percent: 99 %
Brady Statistic AP VS Percent: 1 %
Brady Statistic AS VP Percent: 1 %
Brady Statistic AS VS Percent: 1 %
Brady Statistic RA Percent Paced: 99 %
Date Time Interrogation Session: 20230503004931
Implantable Lead Implant Date: 20160310
Implantable Lead Implant Date: 20160310
Implantable Lead Implant Date: 20160310
Implantable Lead Location: 753858
Implantable Lead Location: 753859
Implantable Lead Location: 753860
Implantable Lead Model: 1948
Implantable Pulse Generator Implant Date: 20160310
Lead Channel Impedance Value: 400 Ohm
Lead Channel Impedance Value: 550 Ohm
Lead Channel Impedance Value: 690 Ohm
Lead Channel Pacing Threshold Amplitude: 0.5 V
Lead Channel Pacing Threshold Amplitude: 0.75 V
Lead Channel Pacing Threshold Amplitude: 0.75 V
Lead Channel Pacing Threshold Pulse Width: 0.5 ms
Lead Channel Pacing Threshold Pulse Width: 0.5 ms
Lead Channel Pacing Threshold Pulse Width: 0.5 ms
Lead Channel Sensing Intrinsic Amplitude: 12 mV
Lead Channel Sensing Intrinsic Amplitude: 2.6 mV
Lead Channel Setting Pacing Amplitude: 2 V
Lead Channel Setting Pacing Amplitude: 2.5 V
Lead Channel Setting Pacing Amplitude: 2.5 V
Lead Channel Setting Pacing Pulse Width: 0.5 ms
Lead Channel Setting Pacing Pulse Width: 0.5 ms
Lead Channel Setting Sensing Sensitivity: 2 mV
Pulse Gen Model: 3242
Pulse Gen Serial Number: 7724805

## 2021-05-17 ENCOUNTER — Ambulatory Visit: Payer: Medicare Other

## 2021-05-17 DIAGNOSIS — Z95810 Presence of automatic (implantable) cardiac defibrillator: Secondary | ICD-10-CM

## 2021-05-17 DIAGNOSIS — I5022 Chronic systolic (congestive) heart failure: Secondary | ICD-10-CM

## 2021-05-17 NOTE — Progress Notes (Signed)
EPIC Encounter for ICM Monitoring ? ?Patient Name: Phillip Booth is a 71 y.o. male ?Date: 05/17/2021 ?Primary Care Physican: Wynelle Fanny, DO ?Primary Cardiologist: Marlou Porch ?Electrophysiologist: Allred ?Bi-V Pacing:  >99%      ?05/04/2021 Weight: 218 lbs  ?05/17/2021 Weight: Not weighed.  ?  ?AT/AF Burden: 0% (no OAC) ?Battery Longevity: 4.8 months  ?  ?Spoke with patient and heart failure questions reviewed.  Pt 's reports swelling in feet have resolved.  Pt has some chest discomfort that comes and goes.  He is not sure if pain is related to indigestion but is concerned.   ?  ?Corvue thoracic impedance suggesting fluid levels returned to normal.   ?  ?Prescribed: Furosemide 20 mg 1 tablet daily ?  ?Labs: ?09/27/2020 Creatinine 0.94, BUN 13, Poassium 4.0, Sodium 4.0, GFR 86 ?A complete set of results can be found in Results Review. ?  ?Recommendations:   Advised to call 911 or use ER if chest pain returns. Encouraged to call office today to schedule appointment about intermittent chest discomfort (no pain today).   ?  ?Follow-up plan: ICM clinic phone appointment on 06/07/2021.   91 day device clinic remote transmission 08/08/2021.   ?  ?EP/Cardiology Office Visits:  07/20/2021 with Dr Marlou Porch.  ?  ?Copy of ICM check sent to Dr. Rayann Heman ? ?3 month ICM trend: 05/15/2021. ? ? ? ?3 month ICM trend: 05/11/2021 ? ? ?12-14 Month ICM trend:  ? ? ? ?Rosalene Billings, RN ?05/17/2021 ?9:17 AM ? ?

## 2021-05-20 NOTE — Addendum Note (Signed)
Addended by: Cheri Kearns A on: 05/20/2021 08:40 AM ? ? Modules accepted: Level of Service ? ?

## 2021-05-20 NOTE — Progress Notes (Signed)
Remote pacemaker transmission.   

## 2021-05-23 ENCOUNTER — Emergency Department (HOSPITAL_BASED_OUTPATIENT_CLINIC_OR_DEPARTMENT_OTHER)
Admission: EM | Admit: 2021-05-23 | Discharge: 2021-05-23 | Disposition: A | Discharge status: Home / Self Care | Payer: Medicare Other | Attending: Emergency Medicine | Admitting: Emergency Medicine

## 2021-05-23 ENCOUNTER — Encounter (HOSPITAL_BASED_OUTPATIENT_CLINIC_OR_DEPARTMENT_OTHER): Payer: Self-pay | Admitting: *Deleted

## 2021-05-23 ENCOUNTER — Other Ambulatory Visit: Payer: Self-pay

## 2021-05-23 DIAGNOSIS — I2510 Atherosclerotic heart disease of native coronary artery without angina pectoris: Secondary | ICD-10-CM | POA: Insufficient documentation

## 2021-05-23 DIAGNOSIS — Z7982 Long term (current) use of aspirin: Secondary | ICD-10-CM | POA: Diagnosis not present

## 2021-05-23 DIAGNOSIS — S39012A Strain of muscle, fascia and tendon of lower back, initial encounter: Secondary | ICD-10-CM | POA: Diagnosis not present

## 2021-05-23 DIAGNOSIS — S3992XA Unspecified injury of lower back, initial encounter: Secondary | ICD-10-CM | POA: Diagnosis present

## 2021-05-23 DIAGNOSIS — Z79899 Other long term (current) drug therapy: Secondary | ICD-10-CM | POA: Insufficient documentation

## 2021-05-23 DIAGNOSIS — I10 Essential (primary) hypertension: Secondary | ICD-10-CM | POA: Insufficient documentation

## 2021-05-23 DIAGNOSIS — X500XXA Overexertion from strenuous movement or load, initial encounter: Secondary | ICD-10-CM | POA: Diagnosis not present

## 2021-05-23 DIAGNOSIS — Y9389 Activity, other specified: Secondary | ICD-10-CM | POA: Insufficient documentation

## 2021-05-23 MED ORDER — DIAZEPAM 2 MG PO TABS
2.0000 mg | ORAL_TABLET | Freq: Once | ORAL | Status: AC
  Administered 2021-05-23: 2 mg via ORAL
  Filled 2021-05-23: qty 1

## 2021-05-23 MED ORDER — LIDOCAINE 5 % EX PTCH: 1.0000 | MEDICATED_PATCH | CUTANEOUS | 0 refills | Status: DC

## 2021-05-23 MED ORDER — KETOROLAC TROMETHAMINE 30 MG/ML IJ SOLN
30.0000 mg | Freq: Once | INTRAMUSCULAR | Status: AC
  Administered 2021-05-23: 30 mg via INTRAMUSCULAR

## 2021-05-23 MED ORDER — ACETAMINOPHEN 325 MG PO TABS
650.0000 mg | ORAL_TABLET | ORAL | Status: AC
  Administered 2021-05-23: 650 mg via ORAL
  Filled 2021-05-23: qty 2

## 2021-05-23 MED ORDER — KETOROLAC TROMETHAMINE 30 MG/ML IJ SOLN
30.0000 mg | Freq: Once | INTRAMUSCULAR | Status: DC
  Filled 2021-05-23: qty 1

## 2021-05-23 MED ORDER — TIZANIDINE HCL 2 MG PO TABS: 2.0000 mg | ORAL_TABLET | Freq: Four times a day (QID) | ORAL | 0 refills | Status: DC | PRN

## 2021-05-23 MED ORDER — ACETAMINOPHEN 500 MG PO TABS: 500.0000 mg | ORAL_TABLET | Freq: Four times a day (QID) | ORAL | 0 refills | Status: DC | PRN

## 2021-05-23 MED ORDER — CYCLOBENZAPRINE HCL 5 MG PO TABS
7.5000 mg | ORAL_TABLET | Freq: Once | ORAL | Status: AC
  Administered 2021-05-23: 7.5 mg via ORAL
  Filled 2021-05-23: qty 2

## 2021-05-23 MED ORDER — FENTANYL CITRATE PF 50 MCG/ML IJ SOSY
50.0000 ug | PREFILLED_SYRINGE | Freq: Once | INTRAMUSCULAR | Status: AC
  Administered 2021-05-23: 50 ug via INTRAMUSCULAR
  Filled 2021-05-23: qty 1

## 2021-05-23 MED ORDER — LIDOCAINE 5 % EX PTCH
1.0000 | MEDICATED_PATCH | CUTANEOUS | Status: DC
  Administered 2021-05-23: 1 via TRANSDERMAL
  Filled 2021-05-23: qty 1

## 2021-05-23 NOTE — ED Notes (Signed)
PA in to evaluate client, client had a very difficult time attempting to stand due to pain, additional orders rec ?

## 2021-05-23 NOTE — ED Notes (Signed)
States he is very active, denies any recent falls, states was moving some boxes yesterday and now has rt lower back pain with intermittent right hip burning sensation. Does have strong RUE and LUE strength and movement, strong bilateral plantar and dorsal flexion. Denies any changes in sensation in RLE and LLE. Per pt statement "it just hurts to move", seeking evaluation and pain relief ?

## 2021-05-23 NOTE — ED Notes (Signed)
Pt ambulatory with steady gait 

## 2021-05-23 NOTE — ED Notes (Signed)
Client resting quietly, states he able to move more easily now, still states some "soreness" but feeling much better ?

## 2021-05-23 NOTE — ED Notes (Signed)
Medicated per orders, client repositioned to left lateral lying for pain control. SR x 2 up with call bell at side ?

## 2021-05-23 NOTE — ED Notes (Signed)
Additional pain meds orders and implemented, VSS, safety measures in place, call bell within reach, client instructed not to get up without staff in room.  ?

## 2021-05-23 NOTE — ED Provider Notes (Signed)
?[facility] ?Provider Note ? ? ?CSN: [id number] ?Arrival date & time: 05/23/21  0831 ? ?  ? ?History ? ?Chief Complaint  ?Patient presents with  ? Back Pain  ? ? ?Phillip Booth is a 71 y.o. male with medical history of CAD, A-fib, cervical strain, vertigo, hypertension.  Patient presents ED for evaluation of back pain that is located on the right side of his back with radiation into the top of his buttock..  Patient states that yesterday, he was in his usual state of health moving boxes from his home into a storage unit.  The patient reports that he also was moving things to include mattresses and some furniture.  The patient reports that he does not typically do strenuous activities.  Patient also reports that his wife was recently hospitalized and as result he has had to lift her at home to help with daily activities.  Patient denies any preceding trauma to account for back pain.  Patient states that ever since yesterday, the back pain is persistently gotten worse.  Patient states that the back pain kept him up all night due to "spasms".  Patient states that this morning upon waking, the pain was so great he was unable to get out of bed.  Patient states that this time he called EMS to transport him to ED.  Patient denies any recent issues with bowel or bladder incontinence, denies any groin numbness.  Patient reports that this morning's episode of urination was normal. ? ? ?Back Pain ? ?  ? ?Home Medications ?Prior to Admission medications   ?Medication Sig Start Date End Date Taking? Authorizing Provider  ?albuterol (VENTOLIN HFA) 108 (90 Base) MCG/ACT inhaler INHALE 2 PUFFS INTO THE LUNGS EVERY 6 (SIX) HOURS AS NEEDED FOR WHEEZING OR SHORTNESS OF BREATH. 08/20/18   Saguier, Percell Miller, PA-C  ?amLODipine (NORVASC) 10 MG tablet Take 1 tablet (10 mg total) by mouth daily. 03/17/19 06/15/19  Verta Ellen., NP  ?aspirin 81 MG tablet Take 81 mg by mouth daily.     [provider]   ?azelastine (ASTELIN) 0.1 % nasal spray Place 2 sprays into both nostrils 2 (two) times daily. Use in each nostril as directed 08/20/18   Saguier, Percell Miller, PA-C  ?carvedilol (COREG) 6.25 MG tablet TAKE 1 TABLET BY MOUTH TWICE DAILY WITH A MEAL 01/01/19   Saguier, Percell Miller, PA-C  ?diclofenac Sodium (VOLTAREN) 1 % GEL Apply 4 g topically 4 (four) times daily. 03/20/20   Deno Etienne, DO  ?fluticasone (FLONASE) 50 MCG/ACT nasal spray Place 2 sprays into both nostrils daily. 02/12/17   Saguier, Percell Miller, PA-C  ?fluticasone (FLOVENT HFA) 110 MCG/ACT inhaler Inhale 2 puffs into the lungs 2 (two) times daily. 08/20/18   Saguier, Percell Miller, PA-C  ?furosemide (LASIX) 20 MG tablet TAKE 1 TABLET BY MOUTH DAILY 05/22/18   Chanetta Marshall K, NP  ?isosorbide mononitrate (IMDUR) 30 MG 24 hr tablet TAKE 1 TABLET (30 MG TOTAL) BY MOUTH DAILY. 02/26/19   Shirley Friar, PA-C  ?ketorolac (ACULAR) 0.5 % ophthalmic solution  06/02/19   [provider]  ?levocetirizine (XYZAL) 5 MG tablet Take 1 tablet (5 mg total) by mouth every evening. 08/20/18   Saguier, Percell Miller, PA-C  ?lisinopril (ZESTRIL) 40 MG tablet TAKE 1 TABLET (40 MG TOTAL) BY MOUTH DAILY. 11/28/18   Saguier, Percell Miller, PA-C  ?metFORMIN (GLUCOPHAGE-XR) 500 MG 24 hr tablet Take 500 mg by mouth daily. 03/24/19   [provider]  ?montelukast (SINGULAIR) 10 MG tablet  Take 1 tablet (10 mg total) by mouth at bedtime. 08/20/18   Saguier, Percell Miller, PA-C  ?rosuvastatin (CRESTOR) 10 MG tablet Take 1 tablet (10 mg total) by mouth daily. 04/18/18   Saguier, Percell Miller, PA-C  ?   ? ?Allergies    ?Lactose intolerance (gi)   ? ?Review of Systems   ?Review of Systems  ?Musculoskeletal:  Positive for back pain.  ?All other systems reviewed and are negative. ? ?Physical Exam ?Updated Vital Signs ?BP 140/81   Pulse 60   Temp 98.2 ?F (36.8 ?C) (Oral)   Resp 17   Ht '6\' 2"'$  (1.88 m)   Wt 99.8 kg   SpO2 96%   BMI 28.25 kg/m?  ?Physical Exam ?Vitals and nursing note reviewed.  ?Constitutional:   ?    General: He is not in acute distress. ?   Appearance: Normal appearance. He is not ill-appearing, toxic-appearing or diaphoretic.  ?HENT:  ?   Head: Normocephalic and atraumatic.  ?   Nose: Nose normal. No congestion.  ?   Mouth/Throat:  ?   Mouth: Mucous membranes are moist.  ?   Pharynx: Oropharynx is clear.  ?Eyes:  ?   Extraocular Movements: Extraocular movements intact.  ?   Conjunctiva/sclera: Conjunctivae normal.  ?   Pupils: Pupils are equal, round, and reactive to light.  ?Cardiovascular:  ?   Rate and Rhythm: Normal rate and regular rhythm.  ?Pulmonary:  ?   Effort: Pulmonary effort is normal. No respiratory distress.  ?   Breath sounds: Normal breath sounds. No wheezing.  ?Abdominal:  ?   General: Abdomen is flat. Bowel sounds are normal.  ?   Palpations: Abdomen is soft.  ?   Tenderness: There is no abdominal tenderness.  ?Musculoskeletal:  ?   Cervical back: Normal, normal range of motion and neck supple. No rigidity or tenderness.  ?   Thoracic back: Normal.  ?   Lumbar back: Tenderness present. Decreased range of motion.  ?   Comments: Diffuse right-sided lumbar tenderness that radiates into the right leg.  ?Skin: ?   General: Skin is warm and dry.  ?   Capillary Refill: Capillary refill takes less than 2 seconds.  ?Neurological:  ?   General: No focal deficit present.  ?   Mental Status: He is alert and oriented to person, place, and time.  ?   GCS: GCS eye subscore is 4. GCS verbal subscore is 5. GCS motor subscore is 6.  ?   Cranial Nerves: Cranial nerves 2-12 are intact. No cranial nerve deficit.  ?   Sensory: Sensation is intact. No sensory deficit.  ?   Motor: Motor function is intact. No weakness.  ?   Comments: Patient unable to perform heel-to-shin maneuver secondary to pain. ?Patient has 5 out of 5 strength to bilateral lower extremities.  Sensation intact.  ? ? ?ED Results / Procedures / Treatments   ?Labs ?(all labs ordered are listed, but only abnormal results are displayed) ?Labs  Reviewed - No data to display ? ?EKG ?None ? ?Radiology ?No results found. ? ?Procedures ?Procedures  ? ? ?Medications Ordered in ED ?Medications  ?lidocaine (LIDODERM) 5 % 1 patch (1 patch Transdermal Patch Applied 05/23/21 0922)  ?cyclobenzaprine (FLEXERIL) tablet 7.5 mg (7.5 mg Oral Given 05/23/21 0920)  ?ketorolac (TORADOL) 30 MG/ML injection 30 mg (30 mg Intramuscular Given 05/23/21 0921)  ?fentaNYL (SUBLIMAZE) injection 50 mcg (50 mcg Intramuscular Given 05/23/21 1100)  ?diazepam (VALIUM) tablet 2 mg (2 mg Oral  Given 05/23/21 1058)  ?acetaminophen (TYLENOL) tablet 650 mg (650 mg Oral Given 05/23/21 1058)  ? ? ?ED Course/ Medical Decision Making/ A&P ?  ?                        ?Medical Decision Making ?Risk ?OTC drugs. ?Prescription drug management. ? ? ?71 year old male presents ED for evaluation of back pain.  Please see HPI for further details. ? ?On examination, the patient is afebrile, nontachycardic.  Patient nonhypoxic on room air, lung sounds clear bilaterally.  Patient abdomen soft compressible all 4 quadrants.  Patient does have diffuse tenderness of his right-sided lumbar spine with radiation into the top of his right buttocks.  Patient denies any groin numbness, bowel or bladder incontinence, lower extremity weakness.  Patient states he is unable to walk due to the pain in his low back.  Patient neurologically intact. ? ?Patient was provided with 7.'5mg'$  Flexeril tablet, 30 mg IM Toradol injection, lidocaine patch.  After administration of these medications, the patient reported that he was still having issues ambulating. ? ?The patient reports that his pain decreased from an 8 to a 6 however he was not able to ambulate without assistance.  At this time, patient was provided with additional 50 mcg fentanyl, 650 mg Tylenol, 2 mg diazepam.  After these medications were administered, the patient reported that he was able to walk with much more ease.  Patient shows ability to ambulate around without  difficulty. ? ?At this time, the patient will be discharged home.  The patient will be advised to follow-up with his PCP for further management.  The patient was given return precautions and he voiced understanding of these.

## 2021-05-23 NOTE — ED Triage Notes (Addendum)
Onset of rt lower/ rt buttocks spasms with intermittent burning sensation. States he was doing a lot of moving and lifting of boxes yesterday. Denies any pain with strong plantar or dorsal flexion bilaterally  ?

## 2021-05-23 NOTE — Discharge Instructions (Signed)
Please return to the ED with any new symptoms such as groin numbness, bowel or bladder incontinence, lower extremity weakness ?Please avoid lifting heavy objects in the future ?Please follow-up with your PCP for any ongoing needs ?You may continue taking Tylenol or ibuprofen for any increased back pain you may experience.  You may also purchase Salonpas patches over-the-counter. ?Please read the attached informational guide concerning lumbar strain ?

## 2021-06-07 ENCOUNTER — Ambulatory Visit (INDEPENDENT_AMBULATORY_CARE_PROVIDER_SITE_OTHER): Payer: Medicare Other

## 2021-06-07 DIAGNOSIS — Z95810 Presence of automatic (implantable) cardiac defibrillator: Secondary | ICD-10-CM | POA: Diagnosis not present

## 2021-06-07 DIAGNOSIS — I5022 Chronic systolic (congestive) heart failure: Secondary | ICD-10-CM | POA: Diagnosis not present

## 2021-06-08 NOTE — Progress Notes (Signed)
EPIC Encounter for ICM Monitoring  Patient Name: Phillip Booth is a 71 y.o. male Date: 06/08/2021 Primary Care Physican: Wynelle Fanny, DO Primary Cardiologist: Marlou Porch Electrophysiologist: Allred Bi-V Pacing:  >99%      05/04/2021 Weight: 218 lbs  06/08/2021 Weight: 218-220 lbs.    AT/AF Burden: 0% (no OAC) Battery Longevity: 3.8 months    Spoke with patient and heart failure questions reviewed.  Pt reports ED visit for back pain and spasms.  No fluid symptoms.     Corvue thoracic impedance suggesting normal fluid levels.     Prescribed: Furosemide 20 mg 1 tablet daily   Labs: 09/27/2020 Creatinine 0.94, BUN 13, Poassium 4.0, Sodium 4.0, GFR 86 A complete set of results can be found in Results Review.   Recommendations:   No changes and encouraged to call if experiencing any fluid symptoms.   Follow-up plan: ICM clinic phone appointment on 07/11/2021.   91 day device clinic remote transmission 08/08/2021.     EP/Cardiology Office Visits:  07/20/2021 with Dr Marlou Porch.    Copy of ICM check sent to Dr. Rayann Heman.  3 month ICM trend: 06/07/2021.    12-14 Month ICM trend:     Rosalene Billings, RN 06/08/2021 4:45 PM

## 2021-07-07 ENCOUNTER — Telehealth: Payer: Self-pay

## 2021-07-07 ENCOUNTER — Ambulatory Visit (INDEPENDENT_AMBULATORY_CARE_PROVIDER_SITE_OTHER): Payer: Medicare Other

## 2021-07-07 DIAGNOSIS — I428 Other cardiomyopathies: Secondary | ICD-10-CM

## 2021-07-07 LAB — CUP PACEART REMOTE DEVICE CHECK
Battery Remaining Longevity: 3 mo
Battery Remaining Percentage: 3 %
Battery Voltage: 2.72 V
Brady Statistic AP VP Percent: 99 %
Brady Statistic AP VS Percent: 1 %
Brady Statistic AS VP Percent: 1 %
Brady Statistic AS VS Percent: 1 %
Brady Statistic RA Percent Paced: 99 %
Date Time Interrogation Session: 20230628232048
Implantable Lead Implant Date: 20160310
Implantable Lead Implant Date: 20160310
Implantable Lead Implant Date: 20160310
Implantable Lead Location: 753858
Implantable Lead Location: 753859
Implantable Lead Location: 753860
Implantable Lead Model: 1948
Implantable Pulse Generator Implant Date: 20160310
Lead Channel Impedance Value: 410 Ohm
Lead Channel Impedance Value: 540 Ohm
Lead Channel Impedance Value: 730 Ohm
Lead Channel Pacing Threshold Amplitude: 0.5 V
Lead Channel Pacing Threshold Amplitude: 0.75 V
Lead Channel Pacing Threshold Amplitude: 0.75 V
Lead Channel Pacing Threshold Pulse Width: 0.5 ms
Lead Channel Pacing Threshold Pulse Width: 0.5 ms
Lead Channel Pacing Threshold Pulse Width: 0.5 ms
Lead Channel Sensing Intrinsic Amplitude: 12 mV
Lead Channel Sensing Intrinsic Amplitude: 2.7 mV
Lead Channel Setting Pacing Amplitude: 2 V
Lead Channel Setting Pacing Amplitude: 2.5 V
Lead Channel Setting Pacing Amplitude: 2.5 V
Lead Channel Setting Pacing Pulse Width: 0.5 ms
Lead Channel Setting Pacing Pulse Width: 0.5 ms
Lead Channel Setting Sensing Sensitivity: 2 mV
Pulse Gen Model: 3242
Pulse Gen Serial Number: 7724805

## 2021-07-07 NOTE — Telephone Encounter (Signed)
Scheduled remote reviewed. Normal device function.   There were two atrial arrhythmias detected, one was 2 hours and 14 minutes and the other was less than one minute, does not appear to be on [place], AF burden is less than 1%, sent to triage. The device estimates 2.9 months until ERI. Patient on monthly remote schedule. Next remote 08/08/2021.  Patient has 2 brief episodes of AT/AF noted on June 18th around [place] patient to investigate symptoms and what he may have been doing at that time.  Patient states he knows exactly what happened.   He had been driving several hours straight from [place], [place] back home.  He had 3 hours left of the road trip and decided to pull over and drink a 5 hour energy drink to keep him awake.  Patient stated he knew immediately it was not a good idea as he was anxious and with significant increased urination following.      Patient denies any other symptoms prior or since the event.  Educated on risks of drinking energy drinks and that it is not safe for him to consume these products and can be very dangerous.  Patient verbalizes understanding.    Will forward this information to Dr. Rayann Heman as fyi and if any further follow up is needed.

## 2021-07-11 ENCOUNTER — Ambulatory Visit (INDEPENDENT_AMBULATORY_CARE_PROVIDER_SITE_OTHER): Payer: Medicare Other

## 2021-07-11 DIAGNOSIS — Z95810 Presence of automatic (implantable) cardiac defibrillator: Secondary | ICD-10-CM

## 2021-07-11 DIAGNOSIS — I5022 Chronic systolic (congestive) heart failure: Secondary | ICD-10-CM

## 2021-07-13 NOTE — Progress Notes (Signed)
EPIC Encounter for ICM Monitoring  Patient Name: Phillip Booth is a 71 y.o. male Date: 07/13/2021 Primary Care Physican: Wynelle Fanny, DO Primary Cardiologist: Marlou Porch Electrophysiologist: Allred Bi-V Pacing:  >99%      05/04/2021 Weight: 218 lbs  06/08/2021 Weight: 218-220 lbs.    AT/AF Burden: <1% (no OAC) Battery Longevity: 2.9 months    Spoke with patient and heart failure questions reviewed.  Pt asymptomatic for fluid accumulation.  Reports feeling well at this time and voices no complaints.     Corvue thoracic impedance suggesting normal fluid levels.     Prescribed: Furosemide 20 mg 1 tablet daily   Labs: 09/27/2020 Creatinine 0.94, BUN 13, Poassium 4.0, Sodium 4.0, GFR 86 A complete set of results can be found in Results Review.   Recommendations:  No changes and encouraged to call if experiencing any fluid symptoms.   Follow-up plan: ICM clinic phone appointment on 08/15/2021.   91 day device clinic remote transmission 08/08/2021.     EP/Cardiology Office Visits:  07/20/2021 with Dr Marlou Porch.    Copy of ICM check sent to Dr. Rayann Heman.  3 month ICM trend: 07/11/2021.    12-14 Month ICM trend:     Rosalene Billings, RN 07/13/2021 9:53 AM

## 2021-07-17 NOTE — Telephone Encounter (Signed)
Continue to monitor.  No changes at this time.

## 2021-07-20 ENCOUNTER — Ambulatory Visit: Payer: Medicare Other | Admitting: Cardiology

## 2021-07-20 NOTE — Assessment & Plan Note (Deleted)
Incidentally found asymptomatic on pacemaker interrogation years ago.  According to Dr. Jackalyn Lombard note he contemplated anticoagulation but ultimately decided against.

## 2021-07-20 NOTE — Assessment & Plan Note (Deleted)
Moderate coronary disease by catheterization in 2016.  Continue with aggressive drug management which includes aspirin 81 mg, Crestor 10 mg daily.  Shooting for LDL goal of less than 70.  Continue with current drug management.  Also on beta-blocker carvedilol 6.25 twice a day.

## 2021-07-20 NOTE — Progress Notes (Deleted)
Cardiology Office Note:    Date:  07/20/2021   ID:  Phillip Booth, DOB October 23, 1950, MRN [id number]  PCP:  Wynelle Fanny, DO   [facility] Providers Cardiologist:  Candee Furbish, MD Cardiology APP:  Patsey Berthold, NP { Click to update primary MD,subspecialty MD or APP then REFRESH:1}    Referring MD: Wynelle Fanny, DO    History of Present Illness:    Damiel Barthold is a 71 y.o. male here for follow-up of nonischemic cardiomyopathy, moderate coronary disease on cardiac catheterization 2016, symptomatic bradycardia with CRT pacemaker [facility], Dr. Rayann Heman with paroxysmal atrial fibrillation 2016 asymptomatic documented on pacemaker interrogation.  Last EF 2022 45%.  Doing well without any chest discomfort no orthopnea no shortness of breath.  Prior nuclear stress test low risk.  Tobias Alexander.  Prior office visit we discussed drummer Earline Mayotte, playing with Merry Proud at the [facility].      Past Medical History:  Diagnosis Date   Allergy    CAD (coronary artery disease)    a. moderate by cath 03/2014   Cataract    Cervical strain 03/25/2020   CVA (cerebral infarction)    a. diagnosis not clear   Hyperlipidemia    Hypertension    Non-ischemic cardiomyopathy ([place])    a. EF 45% by echo 03/2014   Paroxysmal atrial fibrillation ([place]) 05/01/2014   asymptomatic, documented on PPM interrogation,  chads2vasc score is at least 6.  He is contemplating anticoagulation   Symptomatic bradycardia    a. s/p STJ CRTP implanted by Dr Rayann Heman   Vertigo     Past Surgical History:  Procedure Laterality Date   ABDOMINAL SURGERY     APPENDECTOMY     BI-VENTRICULAR PACEMAKER INSERTION N/A 03/19/2014   STJ CRTP implanted by Dr Rayann Heman   EYE SURGERY     Cataract removed from Right eye   [place] WITH CORONARY ANGIOGRAM N/A 03/19/2014   Procedure: LEFT HEART CATHETERIZATION WITH CORONARY ANGIOGRAM;  Surgeon: [name], MD;  Location: [facility] CATH LAB;  Service:  Cardiovascular;  Laterality: N/A;    Current Medications: No outpatient medications have been marked as taking for the 07/20/21 encounter (Appointment) with Jerline Pain, MD.     Allergies:   Lactose intolerance (gi)   Social History   Socioeconomic History   Marital status: Married    Spouse name: Not on file   Number of children: Not on file   Years of education: Not on file   Highest education level: Not on file  Occupational History   Not on file  Tobacco Use   Smoking status: Never   Smokeless tobacco: Never  Vaping Use   Vaping Use: Never used  Substance and Sexual Activity   Alcohol use: Not Currently    Comment: occasional   Drug use: No   Sexual activity: Not on file  Other Topics Concern   Not on file  Social History Narrative   Not on file   Social Determinants of Health   Financial Resource Strain: Not on file  Food Insecurity: Not on file  Transportation Needs: No Transportation Needs (06/17/2019)   PRAPARE - Hydrologist (Medical): No    Lack of Transportation (Non-Medical): No  Physical Activity: Not on file  Stress: Not on file  Social Connections: Not on file     Family History: The patient's ***family history includes Cancer in his mother; Diabetes in his mother;  Emphysema in his father; Heart Problems in his brother and another family member; Heart disease in his mother; Hypertension in his father and mother; Other in his mother.  ROS:   Please see the history of present illness.    *** All other systems reviewed and are negative.  EKGs/Labs/Other Studies Reviewed:    The following studies were reviewed today: ECHO 2022   1. Left ventricular ejection fraction, by estimation, is 45 to 50%. The  left ventricle has mildly decreased function. The left ventricle  demonstrates global hypokinesis. There is mild concentric left ventricular  hypertrophy. Left ventricular diastolic  parameters are consistent with Grade  II diastolic dysfunction  (pseudonormalization). Elevated left atrial pressure.   2. Right ventricular systolic function is normal. The right ventricular  size is normal. There is normal pulmonary artery systolic pressure.   3. Left atrial size was mildly dilated.   4. The mitral valve is normal in structure. Mild mitral valve  regurgitation. No evidence of mitral stenosis.   5. The aortic valve is tricuspid. Aortic valve regurgitation is mild.  Mild aortic valve sclerosis is present, with no evidence of aortic valve  stenosis.   6. The inferior vena cava is normal in size with greater than 50%  respiratory variability, suggesting right atrial pressure of 3 mmHg.   Comparison(s): No significant change from prior study. Prior images  reviewed side by side.    NUC stress 03/21/19: The left ventricular ejection fraction is mildly decreased (45-54%). EF calculated at 48% but visually appears at least 50-55%. Consider correlation with echo. There was no ST segment deviation noted during stress. The study is normal. This is a low risk study.    EKG:  EKG is *** ordered today.  The ekg ordered today demonstrates ***  Recent Labs: 09/27/2020: BUN 13; Creatinine, Ser 0.94; NT-Pro BNP 178; Potassium 4.0; Sodium 141  Recent Lipid Panel    Component Value Date/Time   CHOL 192 12/11/2018 1345   TRIG 186.0 (H) 12/11/2018 1345   HDL 54.10 12/11/2018 1345   CHOLHDL 4 12/11/2018 1345   VLDL 37.2 12/11/2018 1345   LDLCALC 101 (H) 12/11/2018 1345     Risk Assessment/Calculations:   {Does this patient have ATRIAL FIBRILLATION?:[phone]}           Physical Exam:    VS:  There were no vitals taken for this visit.    Wt Readings from Last 3 Encounters:  05/23/21 220 lb (99.8 kg)  09/27/20 221 lb (100.2 kg)  06/09/20 215 lb (97.5 kg)     GEN: *** Well nourished, well developed in no acute distress HEENT: Normal NECK: No JVD; No carotid bruits LYMPHATICS: No  lymphadenopathy CARDIAC: ***RRR, no murmurs, no rubs, gallops RESPIRATORY:  Clear to auscultation without rales, wheezing or rhonchi  ABDOMEN: Soft, non-tender, non-distended MUSCULOSKELETAL:  No edema; No deformity  SKIN: Warm and dry NEUROLOGIC:  Alert and oriented x 3 PSYCHIATRIC:  Normal affect   ASSESSMENT:    1. Coronary artery disease involving native artery of transplanted heart without angina pectoris   2. Paroxysmal atrial fibrillation (HCC)    PLAN:    In order of problems listed above:  Coronary artery disease involving native coronary artery Moderate coronary disease by catheterization in 2016.  Continue with aggressive drug management which includes aspirin 81 mg, Crestor 10 mg daily.  Shooting for LDL goal of less than 70.  Continue with current drug management.  Also on beta-blocker carvedilol 6.25 twice a day.  Atrial fibrillation Incidentally found asymptomatic on pacemaker interrogation years ago.  According to Dr. Jackalyn Lombard note he contemplated anticoagulation but ultimately decided against.     {Are you ordering a CV Procedure (e.g. stress test, cath, DCCV, TEE, etc)?   Press F2        :[id number]}    Medication Adjustments/Labs and Tests Ordered: Current medicines are reviewed at length with the patient today.  Concerns regarding medicines are outlined above.  No orders of the defined types were placed in this encounter.  No orders of the defined types were placed in this encounter.   There are no Patient Instructions on file for this visit.   Signed, Candee Furbish, MD  07/20/2021 8:21 AM    [facility]

## 2021-07-21 NOTE — Progress Notes (Signed)
Remote pacemaker transmission.   

## 2021-08-08 ENCOUNTER — Ambulatory Visit (INDEPENDENT_AMBULATORY_CARE_PROVIDER_SITE_OTHER): Payer: Medicare Other

## 2021-08-08 DIAGNOSIS — I441 Atrioventricular block, second degree: Secondary | ICD-10-CM

## 2021-08-08 LAB — CUP PACEART REMOTE DEVICE CHECK
Battery Remaining Longevity: 1 mo
Battery Remaining Percentage: 2 %
Battery Voltage: 2.69 V
Brady Statistic AP VP Percent: 99 %
Brady Statistic AP VS Percent: 1 %
Brady Statistic AS VP Percent: 1 %
Brady Statistic AS VS Percent: 1 %
Brady Statistic RA Percent Paced: 99 %
Date Time Interrogation Session: 20230731033600
Implantable Lead Implant Date: 20160310
Implantable Lead Implant Date: 20160310
Implantable Lead Implant Date: 20160310
Implantable Lead Location: 753858
Implantable Lead Location: 753859
Implantable Lead Location: 753860
Implantable Lead Model: 1948
Implantable Pulse Generator Implant Date: 20160310
Lead Channel Impedance Value: 410 Ohm
Lead Channel Impedance Value: 540 Ohm
Lead Channel Impedance Value: 690 Ohm
Lead Channel Pacing Threshold Amplitude: 0.5 V
Lead Channel Pacing Threshold Amplitude: 0.75 V
Lead Channel Pacing Threshold Amplitude: 0.75 V
Lead Channel Pacing Threshold Pulse Width: 0.5 ms
Lead Channel Pacing Threshold Pulse Width: 0.5 ms
Lead Channel Pacing Threshold Pulse Width: 0.5 ms
Lead Channel Sensing Intrinsic Amplitude: 12 mV
Lead Channel Sensing Intrinsic Amplitude: 2.3 mV
Lead Channel Setting Pacing Amplitude: 2 V
Lead Channel Setting Pacing Amplitude: 2.5 V
Lead Channel Setting Pacing Amplitude: 2.5 V
Lead Channel Setting Pacing Pulse Width: 0.5 ms
Lead Channel Setting Pacing Pulse Width: 0.5 ms
Lead Channel Setting Sensing Sensitivity: 2 mV
Pulse Gen Model: 3242
Pulse Gen Serial Number: 7724805

## 2021-08-15 ENCOUNTER — Ambulatory Visit (INDEPENDENT_AMBULATORY_CARE_PROVIDER_SITE_OTHER): Payer: Medicare Other

## 2021-08-15 DIAGNOSIS — Z95810 Presence of automatic (implantable) cardiac defibrillator: Secondary | ICD-10-CM

## 2021-08-15 DIAGNOSIS — I5022 Chronic systolic (congestive) heart failure: Secondary | ICD-10-CM | POA: Diagnosis not present

## 2021-08-17 NOTE — Progress Notes (Signed)
EPIC Encounter for ICM Monitoring  Patient Name: Phillip Booth is a 72 y.o. male Date: 08/17/2021 Primary Care Physican: Wynelle Fanny, DO Primary Cardiologist: Marlou Porch Electrophysiologist: Allred Bi-V Pacing:  >99%      05/04/2021 Weight: 218 lbs  06/08/2021 Weight: 218-220 lbs.    AT/AF Burden: <1% (no OAC) Battery Longevity: <3 months    Spoke with patient and heart failure questions reviewed.  Pt asymptomatic for fluid accumulation.  Reports feeling well at this time and voices no complaints.     Corvue thoracic impedance suggesting normal fluid levels.     Prescribed: Furosemide 20 mg 1 tablet daily   Labs: 09/27/2020 Creatinine 0.94, BUN 13, Poassium 4.0, Sodium 4.0, GFR 86 A complete set of results can be found in Results Review.   Recommendations:  No changes and encouraged to call if experiencing any fluid symptoms.   Follow-up plan: ICM clinic phone appointment on 09/19/2021.   91 day device clinic remote transmission 11/09/2021.     EP/Cardiology Office Visits:  Missed 07/20/2021 with Dr Marlou Porch.  Advised to call office to reschedule appointment with Dr Marlou Porch and his yearly EP appointment is due in September which battery replacement will be discussed.    Copy of ICM check sent to Dr. Rayann Heman.  3 month ICM trend: 08/15/2021.     Rosalene Billings, RN 08/17/2021 4:30 PM

## 2021-08-23 ENCOUNTER — Ambulatory Visit: Payer: Medicare Other | Admitting: Physician Assistant

## 2021-08-23 NOTE — Progress Notes (Deleted)
Cardiology Office Note:    Date:  08/23/2021   ID:  [name], DOB Feb 08, 1950, MRN [id number]  PCP:  Wynelle Fanny, DO  [facility] Cardiologist:  Candee Furbish, MD  [facility] Electrophysiologist:  None Dr. Rayann Heman   Chief Complaint: yearly follow up   History of Present Illness:    Phillip Booth is a 71 y.o. male with a hx of chronic systolic congestive heart failure status post BiV ICD, nonischemic cardiomyopathy, moderate CAD, CVA, hypertension, hyperlipidemia and paroxysmal atrial fibrillation seen for follow-up.  Cath 03/2014:  30-40% first diagonal stenosis, 20% mid LAD stenosis, 40% second diagonal stenosis of the LAD; 20% mid circumflex stenosis with distal.  Branch stenoses of 90 and 95% in very distal small vessels; and 20% mid RCA stenosis with 70% ostial stenosis and RV marginal branch. Echo 09/2015: LVEF 60-65% Echo 04/2017: LVEF 55-60% Echo 09/2018: LVEF 45-50% Low risk stress test March 2021. Last echo October 2022 showed LV function of 45 to 50%, global hypokinesis and grade 2 diastolic dysfunction. ICM monitoring 08/15/21 showed normal fluids levels.   Past Medical History:  Diagnosis Date   Allergy    CAD (coronary artery disease)    a. moderate by cath 03/2014   Cataract    Cervical strain 03/25/2020   CVA (cerebral infarction)    a. diagnosis not clear   Hyperlipidemia    Hypertension    Non-ischemic cardiomyopathy ([place])    a. EF 45% by echo 03/2014   Paroxysmal atrial fibrillation ([place]) 05/01/2014   asymptomatic, documented on PPM interrogation,  chads2vasc score is at least 6.  He is contemplating anticoagulation   Symptomatic bradycardia    a. s/p STJ CRTP implanted by Dr Rayann Heman   Vertigo     Past Surgical History:  Procedure Laterality Date   ABDOMINAL SURGERY     APPENDECTOMY     BI-VENTRICULAR PACEMAKER INSERTION N/A 03/19/2014   STJ CRTP implanted by Dr Rayann Heman   EYE SURGERY     Cataract removed from Right eye   [place] WITH CORONARY ANGIOGRAM N/A 03/19/2014   Procedure: LEFT HEART CATHETERIZATION WITH CORONARY ANGIOGRAM;  Surgeon: [name], MD;  Location: [facility] CATH LAB;  Service: Cardiovascular;  Laterality: N/A;    Current Medications: No outpatient medications have been marked as taking for the 08/23/21 encounter (Appointment) with Leanor Kail, [place].     Allergies:   Lactose intolerance (gi)   Social History   Socioeconomic History   Marital status: Married    Spouse name: Not on file   Number of children: Not on file   Years of education: Not on file   Highest education level: Not on file  Occupational History   Not on file  Tobacco Use   Smoking status: Never   Smokeless tobacco: Never  Vaping Use   Vaping Use: Never used  Substance and Sexual Activity   Alcohol use: Not Currently    Comment: occasional   Drug use: No   Sexual activity: Not on file  Other Topics Concern   Not on file  Social History Narrative   Not on file   Social Determinants of Health   Financial Resource Strain: Not on file  Food Insecurity: Not on file  Transportation Needs: No Transportation Needs (06/17/2019)   PRAPARE - Hydrologist (Medical): No    Lack of Transportation (Non-Medical): No  Physical Activity: Not on file  Stress: Not  on file  Social Connections: Not on file     Family History: The patient's family history includes Cancer in his mother; Diabetes in his mother; Emphysema in his father; Heart Problems in his brother and another family member; Heart disease in his mother; Hypertension in his father and mother; Other in his mother.  ***  ROS:   Please see the history of present illness.    All other systems reviewed and are negative. ***  EKGs/Labs/Other Studies Reviewed:    The following studies were reviewed today:  Cath 03/2014 ANGIOGRAPHY:    The left main coronary artery was a normal vessel which bifurcated into the LAD and  left circumflex coronary artery.   The LAD was a moderate size vessel that gave rise to a proximal diagonal vessel.  There was 30-40% narrowing in this first diagonal vessel. The LAD after the septal perforating artery and before the second diagonal vessel at 20% narrowing.  There was 40% ostial narrowing in the second diagonal vessel.  The remainder of the LAD was free of significant disease and wrapped around the LV apex.   The left circumflex coronary artery was a moderate size vessel that had 20% narrowing in its mid segment.  There was a 90% stenosis in a small  branch and  The marginal vessel.  Then trifurcated distally.  There was 90% stenosis in a very small branch vessel and in the very distal aspect of the superior branch were 50 and then 95% near apical stenosis in a small distal  Vessel.   The RCA was a moderate size vessel that had 20% mid narrowing.  There was 60-70% ostial narrowing in a small anterior RV marginal branch prior to its bifurcation.   Left ventriculography revealed  Mild LV dysfunction with an ejection fraction of approximately 45-50%.     IMPRESSION:   Coronary obstructive disease with 30-40% first diagonal stenosis, 20% mid LAD stenosis, 40% second diagonal stenosis of the LAD; 20% mid circumflex stenosis with distal.  Branch stenoses of 90 and 95% in very distal small vessels; and 20% mid RCA stenosis with 70% ostial stenosis and RV marginal branch.   Probable sick sinus syndrome with prolonged first-degree AV block, frequent symptomatic second degree AV block.     RECOMMENDATION:   Medical therapy for CAD. EP evaluation is recommended for need for probable permanent pacemaker.  Stress test 03/2019 The left ventricular ejection fraction is mildly decreased (45-54%). EF calculated at 48% but visually appears at least 50-55%. Consider correlation with echo. There was no ST segment deviation noted during stress. The study is normal. This is a low risk  study.  Echo 10/2020 1. Left ventricular ejection fraction, by estimation, is 45 to 50%. The  left ventricle has mildly decreased function. The left ventricle  demonstrates global hypokinesis. There is mild concentric left ventricular  hypertrophy. Left ventricular diastolic  parameters are consistent with Grade II diastolic dysfunction  (pseudonormalization). Elevated left atrial pressure.   2. Right ventricular systolic function is normal. The right ventricular  size is normal. There is normal pulmonary artery systolic pressure.   3. Left atrial size was mildly dilated.   4. The mitral valve is normal in structure. Mild mitral valve  regurgitation. No evidence of mitral stenosis.   5. The aortic valve is tricuspid. Aortic valve regurgitation is mild.  Mild aortic valve sclerosis is present, with no evidence of aortic valve  stenosis.   6. The inferior vena cava is normal in size  with greater than 50%  respiratory variability, suggesting right atrial pressure of 3 mmHg.   Comparison(s): No significant change from prior study. Prior images  reviewed side by side.   EKG:  EKG is *** ordered today.  The ekg ordered today demonstrates ***  Recent Labs: 09/27/2020: BUN 13; Creatinine, Ser 0.94; NT-Pro BNP 178; Potassium 4.0; Sodium 141  Recent Lipid Panel    Component Value Date/Time   CHOL 192 12/11/2018 1345   TRIG 186.0 (H) 12/11/2018 1345   HDL 54.10 12/11/2018 1345   CHOLHDL 4 12/11/2018 1345   VLDL 37.2 12/11/2018 1345   LDLCALC 101 (H) 12/11/2018 1345     Risk Assessment/Calculations:   {Does this patient have ATRIAL FIBRILLATION?[phone]}   Physical Exam:    VS:  There were no vitals taken for this visit.    Wt Readings from Last 3 Encounters:  05/23/21 220 lb (99.8 kg)  09/27/20 221 lb (100.2 kg)  06/09/20 215 lb (97.5 kg)     GEN: *** Well nourished, well developed in no acute distress HEENT: Normal NECK: No JVD; No carotid bruits LYMPHATICS: No  lymphadenopathy CARDIAC: ***RRR, no murmurs, rubs, gallops RESPIRATORY:  Clear to auscultation without rales, wheezing or rhonchi  ABDOMEN: Soft, non-tender, non-distended MUSCULOSKELETAL:  No edema; No deformity  SKIN: Warm and dry NEUROLOGIC:  Alert and oriented x 3 PSYCHIATRIC:  Normal affect   ASSESSMENT AND PLAN:    ***  2. ***  Medication Adjustments/Labs and Tests Ordered: Current medicines are reviewed at length with the patient today.  Concerns regarding medicines are outlined above.  No orders of the defined types were placed in this encounter.  No orders of the defined types were placed in this encounter.   There are no Patient Instructions on file for this visit.   Jarrett Soho, [place]  08/23/2021 11:42 AM    [facility]

## 2021-09-01 ENCOUNTER — Ambulatory Visit: Payer: Medicare Other | Admitting: Physician Assistant

## 2021-09-01 ENCOUNTER — Encounter: Payer: Self-pay | Admitting: Physician Assistant

## 2021-09-01 VITALS — BP 120/72 | HR 60 | Ht 74.0 in | Wt 218.6 lb

## 2021-09-01 DIAGNOSIS — I5042 Chronic combined systolic (congestive) and diastolic (congestive) heart failure: Secondary | ICD-10-CM | POA: Diagnosis not present

## 2021-09-01 DIAGNOSIS — I10 Essential (primary) hypertension: Secondary | ICD-10-CM | POA: Diagnosis not present

## 2021-09-01 DIAGNOSIS — I25811 Atherosclerosis of native coronary artery of transplanted heart without angina pectoris: Secondary | ICD-10-CM | POA: Diagnosis not present

## 2021-09-01 DIAGNOSIS — I428 Other cardiomyopathies: Secondary | ICD-10-CM | POA: Diagnosis not present

## 2021-09-01 DIAGNOSIS — R001 Bradycardia, unspecified: Secondary | ICD-10-CM

## 2021-09-01 NOTE — Progress Notes (Signed)
Cardiology Office Note:    Date:  09/01/2021   ID:  Phillip Booth, DOB 12/23/1950, MRN [id number]  PCP:  Wynelle Fanny, DO  [facility] Cardiologist:  Candee Furbish, MD  [facility] Electrophysiologist:  [name], MD   Chief Complaint: yearly follow up   History of Present Illness:    Phillip Booth is a 71 y.o. male with a hx of chronic systolic and diastolic CHF with improved LVEF, NICM, s/p BIV ICD, moderate CAD by cath in 2016, PAF, HTN, HLD and CVA seen for follow up.   Cath 03/2014: 30-40% first diagonal stenosis, 20% mid LAD stenosis, 40% second diagonal stenosis of the LAD; 20% mid circumflex stenosis with distal.  Branch stenoses of 90 and 95% in very distal small vessels; and 20% mid RCA stenosis with 70% ostial stenosis and RV marginal branch. Last stress test 2/2019L Low risk  Last echo 10/2020: LVEF 45-50% and grade II DD  Here today for follow up. No complaint. He is going for trip to other country. The patient denies nausea, vomiting, fever, chest pain, palpitations, shortness of breath, orthopnea, PND, dizziness, syncope, congestion, abdominal pain, hematochezia, melena, lower extremity edema.    Past Medical History:  Diagnosis Date   Allergy    CAD (coronary artery disease)    a. moderate by cath 03/2014   Cataract    Cervical strain 03/25/2020   CVA (cerebral infarction)    a. diagnosis not clear   Hyperlipidemia    Hypertension    Non-ischemic cardiomyopathy ([place])    a. EF 45% by echo 03/2014   Paroxysmal atrial fibrillation ([place]) 05/01/2014   asymptomatic, documented on PPM interrogation,  chads2vasc score is at least 6.  He is contemplating anticoagulation   Symptomatic bradycardia    a. s/p STJ CRTP implanted by Dr Rayann Heman   Vertigo     Past Surgical History:  Procedure Laterality Date   ABDOMINAL SURGERY     APPENDECTOMY     BI-VENTRICULAR PACEMAKER INSERTION N/A 03/19/2014   STJ CRTP implanted by Dr Rayann Heman   EYE SURGERY     Cataract removed from  Right eye   [place] WITH CORONARY ANGIOGRAM N/A 03/19/2014   Procedure: LEFT HEART CATHETERIZATION WITH CORONARY ANGIOGRAM;  Surgeon: [name], MD;  Location: [facility] CATH LAB;  Service: Cardiovascular;  Laterality: N/A;    Current Medications: Current Meds  Medication Sig   acetaminophen (TYLENOL) 500 MG tablet Take 1 tablet (500 mg total) by mouth every 6 (six) hours as needed for moderate pain.   albuterol (VENTOLIN HFA) 108 (90 Base) MCG/ACT inhaler INHALE 2 PUFFS INTO THE LUNGS EVERY 6 (SIX) HOURS AS NEEDED FOR WHEEZING OR SHORTNESS OF BREATH.   aspirin 81 MG tablet Take 81 mg by mouth daily.    azelastine (ASTELIN) 0.1 % nasal spray Place 2 sprays into both nostrils 2 (two) times daily. Use in each nostril as directed   carvedilol (COREG) 6.25 MG tablet TAKE 1 TABLET BY MOUTH TWICE DAILY WITH A MEAL   diclofenac Sodium (VOLTAREN) 1 % GEL Apply 4 g topically 4 (four) times daily.   fluticasone (FLONASE) 50 MCG/ACT nasal spray Place 2 sprays into both nostrils daily.   fluticasone (FLOVENT HFA) 110 MCG/ACT inhaler Inhale 2 puffs into the lungs 2 (two) times daily.   furosemide (LASIX) 20 MG tablet TAKE 1 TABLET BY MOUTH DAILY   isosorbide mononitrate (IMDUR) 30 MG 24 hr tablet TAKE 1 TABLET (30  MG TOTAL) BY MOUTH DAILY.   ketorolac (ACULAR) 0.5 % ophthalmic solution    levocetirizine (XYZAL) 5 MG tablet Take 1 tablet (5 mg total) by mouth every evening.   lidocaine (LIDODERM) 5 % Place 1 patch onto the skin daily. Remove & Discard patch within 12 hours or as directed by MD   lisinopril (ZESTRIL) 40 MG tablet TAKE 1 TABLET (40 MG TOTAL) BY MOUTH DAILY.   metFORMIN (GLUCOPHAGE-XR) 500 MG 24 hr tablet Take 500 mg by mouth daily.   montelukast (SINGULAIR) 10 MG tablet Take 1 tablet (10 mg total) by mouth at bedtime.   rosuvastatin (CRESTOR) 10 MG tablet Take 1 tablet (10 mg total) by mouth daily.   tiZANidine (ZANAFLEX) 2 MG tablet Take 1 tablet (2 mg  total) by mouth every 6 (six) hours as needed for muscle spasms.     Allergies:   Lactose intolerance (gi)   Social History   Socioeconomic History   Marital status: Married    Spouse name: Not on file   Number of children: Not on file   Years of education: Not on file   Highest education level: Not on file  Occupational History   Not on file  Tobacco Use   Smoking status: Never   Smokeless tobacco: Never  Vaping Use   Vaping Use: Never used  Substance and Sexual Activity   Alcohol use: Not Currently    Comment: occasional   Drug use: No   Sexual activity: Not on file  Other Topics Concern   Not on file  Social History Narrative   Not on file   Social Determinants of Health   Financial Resource Strain: Not on file  Food Insecurity: Not on file  Transportation Needs: No Transportation Needs (06/17/2019)   PRAPARE - Hydrologist (Medical): No    Lack of Transportation (Non-Medical): No  Physical Activity: Not on file  Stress: Not on file  Social Connections: Not on file     Family History: The patient's family history includes Cancer in his mother; Diabetes in his mother; Emphysema in his father; Heart Problems in his brother and another family member; Heart disease in his mother; Hypertension in his father and mother; Other in his mother.    ROS:   Please see the history of present illness.    All other systems reviewed and are negative.   EKGs/Labs/Other Studies Reviewed:    The following studies were reviewed today:  [facility]ho 10/2020  1. Left ventricular ejection fraction, by estimation, is 45 to 50%. The  left ventricle has mildly decreased function. The left ventricle  demonstrates global hypokinesis. There is mild concentric left ventricular  hypertrophy. Left ventricular diastolic  parameters are consistent with Grade II diastolic dysfunction  (pseudonormalization). Elevated left atrial pressure.   2. Right ventricular systolic  function is normal. The right ventricular  size is normal. There is normal pulmonary artery systolic pressure.   3. Left atrial size was mildly dilated.   4. The mitral valve is normal in structure. Mild mitral valve  regurgitation. No evidence of mitral stenosis.   5. The aortic valve is tricuspid. Aortic valve regurgitation is mild.  Mild aortic valve sclerosis is present, with no evidence of aortic valve  stenosis.   6. The inferior vena cava is normal in size with greater than 50%  respiratory variability, suggesting right atrial pressure of 3 mmHg.   Comparison(s): No significant change from prior study. Prior images  reviewed  side by side.   Stress test 02/2017 Nuclear stress EF: 49%. The study is normal. This is a low risk study.   Normal pharmacologic nuclear stress test with no evidence for prior infarct or ischemia.  LVEF calculated at 49% but appears better, correlation with an echocardiogram is recommended.   Cath 03/2014: ANGIOGRAPHY:    The left main coronary artery was a normal vessel which bifurcated into the LAD and left circumflex coronary artery.   The LAD was a moderate size vessel that gave rise to a proximal diagonal vessel.  There was 30-40% narrowing in this first diagonal vessel. The LAD after the septal perforating artery and before the second diagonal vessel at 20% narrowing.  There was 40% ostial narrowing in the second diagonal vessel.  The remainder of the LAD was free of significant disease and wrapped around the LV apex.   The left circumflex coronary artery was a moderate size vessel that had 20% narrowing in its mid segment.  There was a 90% stenosis in a small  branch and  The marginal vessel.  Then trifurcated distally.  There was 90% stenosis in a very small branch vessel and in the very distal aspect of the superior branch were 50 and then 95% near apical stenosis in a small distal  Vessel.   The RCA was a moderate size vessel that had 20% mid  narrowing.  There was 60-70% ostial narrowing in a small anterior RV marginal branch prior to its bifurcation.   Left ventriculography revealed  Mild LV dysfunction with an ejection fraction of approximately 45-50%.     IMPRESSION:   Coronary obstructive disease with 30-40% first diagonal stenosis, 20% mid LAD stenosis, 40% second diagonal stenosis of the LAD; 20% mid circumflex stenosis with distal.  Branch stenoses of 90 and 95% in very distal small vessels; and 20% mid RCA stenosis with 70% ostial stenosis and RV marginal branch.   Probable sick sinus syndrome with prolonged first-degree AV block, frequent symptomatic second degree AV block.     RECOMMENDATION:   Medical therapy for CAD. EP evaluation is recommended for need for probable permanent pacemaker.  EKG:  EKG is  ordered today.  The ekg ordered today demonstrates AV   Recent Labs: 09/27/2020: BUN 13; Creatinine, Ser 0.94; NT-Pro BNP 178; Potassium 4.0; Sodium 141  Recent Lipid Panel    Component Value Date/Time   CHOL 192 12/11/2018 1345   TRIG 186.0 (H) 12/11/2018 1345   HDL 54.10 12/11/2018 1345   CHOLHDL 4 12/11/2018 1345   VLDL 37.2 12/11/2018 1345   LDLCALC 101 (H) 12/11/2018 1345     Physical Exam:    VS:  BP 120/72   Pulse 60   Ht '6\' 2"'$  (1.88 m)   Wt 218 lb 9.6 oz (99.2 kg)   SpO2 97%   BMI 28.07 kg/m     Wt Readings from Last 3 Encounters:  09/01/21 218 lb 9.6 oz (99.2 kg)  05/23/21 220 lb (99.8 kg)  09/27/20 221 lb (100.2 kg)     GEN:  Well nourished, well developed in no acute distress HEENT: Normal NECK: No JVD; No carotid bruits LYMPHATICS: No lymphadenopathy CARDIAC: RRR, no murmurs, rubs, gallops RESPIRATORY:  Clear to auscultation without rales, wheezing or rhonchi  ABDOMEN: Soft, non-tender, non-distended MUSCULOSKELETAL:  No edema; No deformity  SKIN: Warm and dry NEUROLOGIC:  Alert and oriented x 3 PSYCHIATRIC:  Normal affect   ASSESSMENT AND PLAN:    Chronic combined  CHF/NICM -  Last echo 10/2020 with stable LVEF @ 45-50% and grade II DD -Euvolemic - Continue Coreg, Imdur, Lisinopril and lasix  2. Moderates CAD - No angina. Continue ASA, statin and BB  3. HTN - BP stable on current medications   4. HLD - Continue statin   5. Symptomatic bradycardia s/p BIV ICD - Followed by EP  Medication Adjustments/Labs and Tests Ordered: Current medicines are reviewed at length with the patient today.  Concerns regarding medicines are outlined above.  Orders Placed This Encounter  Procedures   EKG 12-Lead   No orders of the defined types were placed in this encounter.   Patient Instructions  Medication Instructions:   Your physician recommends that you continue on your current medications as directed. Please refer to the Current Medication list given to you today.  *If you need a refill on your cardiac medications before your next appointment, please call your pharmacy*   Follow-Up: At [facility], you and your health needs are our priority.  As part of our continuing mission to provide you with exceptional heart care, we have created designated Provider Care Teams.  These Care Teams include your primary Cardiologist (physician) and Advanced Practice Providers (APPs -  Physician Assistants and Nurse Practitioners) who all work together to provide you with the care you need, when you need it.  We recommend signing up for the patient portal called "MyChart".  Sign up information is provided on this After Visit Summary.  MyChart is used to connect with patients for Virtual Visits (Telemedicine).  Patients are able to view lab/test results, encounter notes, upcoming appointments, etc.  Non-urgent messages can be sent to your provider as well.   To learn more about what you can do with MyChart, go to [email or web address].    Your next appointment:   1 year(s)  The format for your next appointment:   In Person  Provider:   Candee Furbish, MD  {   Important Information About Sugar         Jarrett Soho, [place]  09/01/2021 3:26 PM    Phillip Booth

## 2021-09-01 NOTE — Patient Instructions (Signed)
Medication Instructions:   Your physician recommends that you continue on your current medications as directed. Please refer to the Current Medication list given to you today.  *If you need a refill on your cardiac medications before your next appointment, please call your pharmacy*   Follow-Up: At [facility], you and your health needs are our priority.  As part of our continuing mission to provide you with exceptional heart care, we have created designated Provider Care Teams.  These Care Teams include your primary Cardiologist (physician) and Advanced Practice Providers (APPs -  Physician Assistants and Nurse Practitioners) who all work together to provide you with the care you need, when you need it.  We recommend signing up for the patient portal called "MyChart".  Sign up information is provided on this After Visit Summary.  MyChart is used to connect with patients for Virtual Visits (Telemedicine).  Patients are able to view lab/test results, encounter notes, upcoming appointments, etc.  Non-urgent messages can be sent to your provider as well.   To learn more about what you can do with MyChart, go to [email or web address].    Your next appointment:   1 year(s)  The format for your next appointment:   In Person  Provider:   Candee Furbish, MD {   Important Information About Sugar

## 2021-09-05 ENCOUNTER — Ambulatory Visit (INDEPENDENT_AMBULATORY_CARE_PROVIDER_SITE_OTHER): Payer: Medicare Other

## 2021-09-05 DIAGNOSIS — I428 Other cardiomyopathies: Secondary | ICD-10-CM

## 2021-09-08 LAB — CUP PACEART REMOTE DEVICE CHECK
Battery Remaining Longevity: 1 mo
Battery Remaining Percentage: 1 %
Battery Voltage: 2.65 V
Brady Statistic AP VP Percent: 99 %
Brady Statistic AP VS Percent: 1 %
Brady Statistic AS VP Percent: 1 %
Brady Statistic AS VS Percent: 1 %
Brady Statistic RA Percent Paced: 99 %
Date Time Interrogation Session: 20230831055042
Implantable Lead Implant Date: 20160310
Implantable Lead Implant Date: 20160310
Implantable Lead Implant Date: 20160310
Implantable Lead Location: 753858
Implantable Lead Location: 753859
Implantable Lead Location: 753860
Implantable Lead Model: 1948
Implantable Pulse Generator Implant Date: 20160310
Lead Channel Impedance Value: 410 Ohm
Lead Channel Impedance Value: 540 Ohm
Lead Channel Impedance Value: 700 Ohm
Lead Channel Pacing Threshold Amplitude: 0.5 V
Lead Channel Pacing Threshold Amplitude: 0.75 V
Lead Channel Pacing Threshold Amplitude: 0.75 V
Lead Channel Pacing Threshold Pulse Width: 0.5 ms
Lead Channel Pacing Threshold Pulse Width: 0.5 ms
Lead Channel Pacing Threshold Pulse Width: 0.5 ms
Lead Channel Sensing Intrinsic Amplitude: 12 mV
Lead Channel Sensing Intrinsic Amplitude: 3.3 mV
Lead Channel Setting Pacing Amplitude: 2 V
Lead Channel Setting Pacing Amplitude: 2.5 V
Lead Channel Setting Pacing Amplitude: 2.5 V
Lead Channel Setting Pacing Pulse Width: 0.5 ms
Lead Channel Setting Pacing Pulse Width: 0.5 ms
Lead Channel Setting Sensing Sensitivity: 2 mV
Pulse Gen Model: 3242
Pulse Gen Serial Number: 7724805

## 2021-09-08 NOTE — Progress Notes (Signed)
Remote pacemaker transmission.   

## 2021-09-19 ENCOUNTER — Ambulatory Visit (INDEPENDENT_AMBULATORY_CARE_PROVIDER_SITE_OTHER): Payer: Medicare Other

## 2021-09-19 DIAGNOSIS — Z95810 Presence of automatic (implantable) cardiac defibrillator: Secondary | ICD-10-CM | POA: Diagnosis not present

## 2021-09-19 DIAGNOSIS — I5042 Chronic combined systolic (congestive) and diastolic (congestive) heart failure: Secondary | ICD-10-CM

## 2021-09-21 NOTE — Progress Notes (Signed)
EPIC Encounter for ICM Monitoring  Patient Name: Phillip Booth is a 71 y.o. male Date: 09/21/2021 Primary Care Physican: Wynelle Fanny, DO Primary Cardiologist: Marlou Porch Electrophysiologist: Mealor Bi-V Pacing:  >99%      05/04/2021 Weight: 218 lbs  06/08/2021 Weight: 218-220 lbs.    AT/AF Burden: <1% (no OAC) Battery Longevity: <3 months    Spoke with patient and heart failure questions reviewed.  Pt asymptomatic for fluid accumulation.  Reports feeling well at this time and voices no complaints.    Corvue thoracic impedance suggesting normal fluid levels.     Prescribed: Furosemide 20 mg 1 tablet daily   Labs: 09/27/2020 Creatinine 0.94, BUN 13, Poassium 4.0, Sodium 4.0, GFR 86 A complete set of results can be found in Results Review.   Recommendations:   No changes and encouraged to call if experiencing any fluid symptoms.   Follow-up plan: ICM clinic phone appointment on 10/24/2021.   91 day device clinic remote transmission 11/09/2021.     EP/Cardiology Office Visits:  Has been advised to call to reschedule missed 07/20/2021 with Dr Marlou Porch.  10/03/2021 with Oda Kilts, PA.    Copy of ICM check sent to Dr. Myles Gip.   3 month ICM trend: 09/19/2021.    12-14 Month ICM trend:     Rosalene Billings, RN 09/21/2021 7:49 AM

## 2021-09-26 NOTE — Progress Notes (Signed)
Electrophysiology Office Note Date: 10/03/2021  ID:  Phillip Booth, DOB Nov 02, 1950, MRN [id number]  PCP: Wynelle Fanny, DO Primary Cardiologist: Candee Furbish, MD Electrophysiologist: Phillip Grayer, MD -> Dr. Myles Gip  CC: Pacemaker follow-up  Phillip Booth is a 71 y.o. male seen today for Phillip Grayer, MD for routine electrophysiology followup. Since last being seen in our clinic the patient reports doing very well.  he denies chest pain, palpitations, dyspnea, PND, orthopnea, nausea, vomiting, dizziness, syncope, edema, weight gain, or early satiety.   Device History: St. Jude BiV PPM implanted 03/19/2014 for symptomatic bradycardia  Past Medical History:  Diagnosis Date   Allergy    CAD (coronary artery disease)    a. moderate by cath 03/2014   Cataract    Cervical strain 03/25/2020   CVA (cerebral infarction)    a. diagnosis not clear   Hyperlipidemia    Hypertension    Non-ischemic cardiomyopathy ([place])    a. EF 45% by echo 03/2014   Paroxysmal atrial fibrillation ([place]) 05/01/2014   asymptomatic, documented on PPM interrogation,  chads2vasc score is at least 6.  He is contemplating anticoagulation   Symptomatic bradycardia    a. s/p STJ CRTP implanted by Dr Rayann Heman   Vertigo    Past Surgical History:  Procedure Laterality Date   ABDOMINAL SURGERY     APPENDECTOMY     BI-VENTRICULAR PACEMAKER INSERTION N/A 03/19/2014   STJ CRTP implanted by Dr Rayann Heman   EYE SURGERY     Cataract removed from Right eye   [place] WITH CORONARY ANGIOGRAM N/A 03/19/2014   Procedure: LEFT HEART CATHETERIZATION WITH CORONARY ANGIOGRAM;  Surgeon: Phillip Sine, MD;  Location: [facility] CATH LAB;  Service: Cardiovascular;  Laterality: N/A;    Current Outpatient Medications  Medication Sig Dispense Refill   acetaminophen (TYLENOL) 500 MG tablet Take 1 tablet (500 mg total) by mouth every 6 (six) hours as needed for moderate pain. 20 tablet 0   albuterol (VENTOLIN HFA) 108  (90 Base) MCG/ACT inhaler INHALE 2 PUFFS INTO THE LUNGS EVERY 6 (SIX) HOURS AS NEEDED FOR WHEEZING OR SHORTNESS OF BREATH. 18 g 2   aspirin 81 MG tablet Take 81 mg by mouth daily.      azelastine (ASTELIN) 0.1 % nasal spray Place 2 sprays into both nostrils 2 (two) times daily. Use in each nostril as directed 30 mL 1   carvedilol (COREG) 6.25 MG tablet TAKE 1 TABLET BY MOUTH TWICE DAILY WITH A MEAL 180 tablet 0   diclofenac Sodium (VOLTAREN) 1 % GEL Apply 4 g topically 4 (four) times daily. 100 g 0   fluticasone (FLONASE) 50 MCG/ACT nasal spray Place 2 sprays into both nostrils daily. 16 g 2   fluticasone (FLOVENT HFA) 110 MCG/ACT inhaler Inhale 2 puffs into the lungs 2 (two) times daily. 1 Inhaler 3   furosemide (LASIX) 20 MG tablet TAKE 1 TABLET BY MOUTH DAILY 90 tablet 2   isosorbide mononitrate (IMDUR) 30 MG 24 hr tablet TAKE 1 TABLET (30 MG TOTAL) BY MOUTH DAILY. 30 tablet 5   ketorolac (ACULAR) 0.5 % ophthalmic solution      levocetirizine (XYZAL) 5 MG tablet Take 1 tablet (5 mg total) by mouth every evening. 30 tablet 3   lidocaine (LIDODERM) 5 % Place 1 patch onto the skin daily. Remove & Discard patch within 12 hours or as directed by MD 10 patch 0   lisinopril (ZESTRIL) 40 MG tablet TAKE 1 TABLET (  40 MG TOTAL) BY MOUTH DAILY. 30 tablet 3   metFORMIN (GLUCOPHAGE-XR) 500 MG 24 hr tablet Take 500 mg by mouth daily.     montelukast (SINGULAIR) 10 MG tablet Take 1 tablet (10 mg total) by mouth at bedtime. 30 tablet 3   rosuvastatin (CRESTOR) 10 MG tablet Take 1 tablet (10 mg total) by mouth daily. 30 tablet 11   tiZANidine (ZANAFLEX) 2 MG tablet Take 1 tablet (2 mg total) by mouth every 6 (six) hours as needed for muscle spasms. 20 tablet 0   amLODipine (NORVASC) 10 MG tablet Take 1 tablet (10 mg total) by mouth daily. 90 tablet 3   No current facility-administered medications for this visit.    Allergies:   Lactose intolerance (gi)   Social History: Social History   Socioeconomic  History   Marital status: Married    Spouse name: Not on file   Number of children: Not on file   Years of education: Not on file   Highest education level: Not on file  Occupational History   Not on file  Tobacco Use   Smoking status: Never   Smokeless tobacco: Never  Vaping Use   Vaping Use: Never used  Substance and Sexual Activity   Alcohol use: Not Currently    Comment: occasional   Drug use: No   Sexual activity: Not on file  Other Topics Concern   Not on file  Social History Narrative   Not on file   Social Determinants of Health   Financial Resource Strain: Not on file  Food Insecurity: Not on file  Transportation Needs: No Transportation Needs (06/17/2019)   PRAPARE - Hydrologist (Medical): No    Lack of Transportation (Non-Medical): No  Physical Activity: Not on file  Stress: Not on file  Social Connections: Not on file  Intimate Partner Violence: Not on file    Family History: Family History  Problem Relation Age of Onset   Hypertension Mother    Cancer Mother        brain (possibly started in lung)   Heart disease Mother    Diabetes Mother    Other Mother        cardiomegaly   Hypertension Father    Emphysema Father    Heart Problems Brother        pacemaker   Heart Problems Other        "using 10% of heart"     Review of Systems: All other systems reviewed and are otherwise negative except as noted above.  Physical Exam: Vitals:   10/03/21 1140  BP: 128/82  Pulse: 68  SpO2: 97%  Weight: 218 lb (98.9 kg)  Height: '6\' 2"'$  (1.88 m)     GEN- The patient is well appearing, alert and oriented x 3 today.   HEENT: normocephalic, atraumatic; sclera clear, conjunctiva pink; hearing intact; oropharynx clear; neck supple, no JVP Lymph- no cervical lymphadenopathy Lungs- Clear to ausculation bilaterally, normal work of breathing.  No wheezes, rales, rhonchi Heart- Regular  rate and rhythm, no murmurs, rubs or gallops,  PMI not laterally displaced GI- soft, non-tender, non-distended, bowel sounds present, no hepatosplenomegaly Extremities- no clubbing or cyanosis. No peripheral edema; DP/PT/radial pulses 2+ bilaterally MS- no significant deformity or atrophy Skin- warm and dry, no rash or lesion; PPM pocket well healed Psych- euthymic mood, full affect Neuro- strength and sensation are intact  PPM Interrogation-  reviewed in detail today,  See PACEART report.  EKG:  EKG is not ordered today. EKG 09/01/2021 showed AV dual paced at 60 bpm  Recent Labs: No results found for requested labs within last 365 days.   Wt Readings from Last 3 Encounters:  10/03/21 218 lb (98.9 kg)  09/01/21 218 lb 9.6 oz (99.2 kg)  05/23/21 220 lb (99.8 kg)     Other studies Reviewed: Additional studies/ records that were reviewed today include: Previous EP office notes, Previous remote checks, Most recent labwork.   Assessment and Plan:  1. Second Degree AV block s/p St. Jude PPM  Normal PPM function See Pace Art report No changes today Device Voltage at 2.63 with ERI at 2.62. Close follow up with Dr. Myles Gip to discuss/schedule gen change.   2. NICM CM Echo 10/2020 LVEF 45-50%    3. [place] No afib episodes No indication for [place] at this time   4. HTN Stable on current regimen    Current medicines are reviewed at length with the patient today.    Disposition:   Follow up with Dr. Myles Gip  in 4-6 weeks to discuss/plan gen change.    Jacalyn Lefevre, PA-C  10/03/2021 11:51 AM  [facility] [address] [place] [address] [phone] (office) [phone] (fax)

## 2021-09-28 NOTE — Addendum Note (Signed)
Addended by: Douglass Rivers D on: 09/28/2021 09:39 AM   Modules accepted: Level of Service

## 2021-09-28 NOTE — Progress Notes (Signed)
Remote pacemaker transmission.   

## 2021-10-03 ENCOUNTER — Ambulatory Visit: Payer: Medicare Other | Attending: Student | Admitting: Student

## 2021-10-03 ENCOUNTER — Encounter: Payer: Self-pay | Admitting: Student

## 2021-10-03 VITALS — BP 128/82 | HR 68 | Ht 74.0 in | Wt 218.0 lb

## 2021-10-03 DIAGNOSIS — I10 Essential (primary) hypertension: Secondary | ICD-10-CM | POA: Diagnosis not present

## 2021-10-03 DIAGNOSIS — I471 Supraventricular tachycardia: Secondary | ICD-10-CM | POA: Diagnosis not present

## 2021-10-03 DIAGNOSIS — I5042 Chronic combined systolic (congestive) and diastolic (congestive) heart failure: Secondary | ICD-10-CM | POA: Diagnosis not present

## 2021-10-03 DIAGNOSIS — Z95810 Presence of automatic (implantable) cardiac defibrillator: Secondary | ICD-10-CM | POA: Diagnosis not present

## 2021-10-03 LAB — CUP PACEART INCLINIC DEVICE CHECK
Date Time Interrogation Session: 20230925122918
Implantable Lead Implant Date: 20160310
Implantable Lead Implant Date: 20160310
Implantable Lead Implant Date: 20160310
Implantable Lead Location: 753858
Implantable Lead Location: 753859
Implantable Lead Location: 753860
Implantable Lead Model: 1948
Implantable Pulse Generator Implant Date: 20160310
Pulse Gen Model: 3242
Pulse Gen Serial Number: 7724805

## 2021-10-03 NOTE — Patient Instructions (Signed)
Medication Instructions:  Your physician recommends that you continue on your current medications as directed. Please refer to the Current Medication list given to you today.  *If you need a refill on your cardiac medications before your next appointment, please call your pharmacy*   Lab Work: None If you have labs (blood work) drawn today and your tests are completely normal, you will receive your results only by: [place] (if you have MyChart) OR A paper copy in the mail If you have any lab test that is abnormal or we need to change your treatment, we will call you to review the results.   Follow-Up: At [facility], you and your health needs are our priority.  As part of our continuing mission to provide you with exceptional heart care, we have created designated Provider Care Teams.  These Care Teams include your primary Cardiologist (physician) and Advanced Practice Providers (APPs -  Physician Assistants and Nurse Practitioners) who all work together to provide you with the care you need, when you need it.   Your next appointment:   4-6 week(s) to discuss Gen Change  The format for your next appointment:   In Person  Provider:   Doralee Albino, MD

## 2021-10-06 ENCOUNTER — Ambulatory Visit (INDEPENDENT_AMBULATORY_CARE_PROVIDER_SITE_OTHER): Payer: Medicare Other

## 2021-10-06 DIAGNOSIS — I428 Other cardiomyopathies: Secondary | ICD-10-CM

## 2021-10-10 LAB — CUP PACEART REMOTE DEVICE CHECK
Battery Remaining Longevity: 1 mo
Battery Remaining Percentage: 0.5 %
Battery Voltage: 2.62 V
Brady Statistic AP VP Percent: 99 %
Brady Statistic AP VS Percent: 1 %
Brady Statistic AS VP Percent: 1 %
Brady Statistic AS VS Percent: 1 %
Brady Statistic RA Percent Paced: 99 %
Date Time Interrogation Session: 20231002015033
Implantable Lead Implant Date: 20160310
Implantable Lead Implant Date: 20160310
Implantable Lead Implant Date: 20160310
Implantable Lead Location: 753858
Implantable Lead Location: 753859
Implantable Lead Location: 753860
Implantable Lead Model: 1948
Implantable Pulse Generator Implant Date: 20160310
Lead Channel Impedance Value: 410 Ohm
Lead Channel Impedance Value: 540 Ohm
Lead Channel Impedance Value: 740 Ohm
Lead Channel Pacing Threshold Amplitude: 0.75 V
Lead Channel Pacing Threshold Amplitude: 0.75 V
Lead Channel Pacing Threshold Amplitude: 0.75 V
Lead Channel Pacing Threshold Pulse Width: 0.5 ms
Lead Channel Pacing Threshold Pulse Width: 0.5 ms
Lead Channel Pacing Threshold Pulse Width: 0.5 ms
Lead Channel Sensing Intrinsic Amplitude: 12 mV
Lead Channel Sensing Intrinsic Amplitude: 3.5 mV
Lead Channel Setting Pacing Amplitude: 2 V
Lead Channel Setting Pacing Amplitude: 2.5 V
Lead Channel Setting Pacing Amplitude: 2.5 V
Lead Channel Setting Pacing Pulse Width: 0.5 ms
Lead Channel Setting Pacing Pulse Width: 0.5 ms
Lead Channel Setting Sensing Sensitivity: 2 mV
Pulse Gen Model: 3242
Pulse Gen Serial Number: 7724805

## 2021-10-12 NOTE — Progress Notes (Signed)
Remote pacemaker transmission.   

## 2021-10-12 NOTE — Addendum Note (Signed)
Addended by: Cheri Kearns A on: 10/12/2021 10:23 AM   Modules accepted: Level of Service

## 2021-10-24 ENCOUNTER — Ambulatory Visit (INDEPENDENT_AMBULATORY_CARE_PROVIDER_SITE_OTHER): Payer: Medicare Other

## 2021-10-24 DIAGNOSIS — Z95810 Presence of automatic (implantable) cardiac defibrillator: Secondary | ICD-10-CM

## 2021-10-24 DIAGNOSIS — I5042 Chronic combined systolic (congestive) and diastolic (congestive) heart failure: Secondary | ICD-10-CM

## 2021-10-26 NOTE — Progress Notes (Signed)
EPIC Encounter for ICM Monitoring  Patient Name: Phillip Booth is a 71 y.o. male Date: 10/26/2021 Primary Care Physican: Wynelle Fanny, DO Primary Cardiologist: Marlou Porch Electrophysiologist: Mealor Bi-V Pacing:  >99%      05/04/2021 Weight: 218 lbs  10/03/2021 Weight: 218-220 lbs.    AT/AF Burden: 0% (no OAC) Battery Longevity: <3 months    Spoke with patient and heart failure questions reviewed.  Pt asymptomatic for fluid accumulation.  Reports feeling well at this time and voices no complaints.    Corvue thoracic impedance suggesting normal fluid levels.     Prescribed: Furosemide 20 mg 1 tablet daily   Labs: 09/27/2020 Creatinine 0.94, BUN 13, Poassium 4.0, Sodium 4.0, GFR 86 A complete set of results can be found in Results Review.   Recommendations:   No changes and encouraged to call if experiencing any fluid symptoms.   Follow-up plan: ICM clinic phone appointment on 11/28/2021.   91 day device clinic remote transmission 11/09/2021.     EP/Cardiology Office Visits:  11/07/2021 with Dr Myles Gip.    Copy of ICM check sent to Dr. Myles Gip.  3 month ICM trend: 10/24/2021.    12-14 Month ICM trend:     Rosalene Billings, RN 10/26/2021 1:28 PM

## 2021-11-04 ENCOUNTER — Ambulatory Visit (INDEPENDENT_AMBULATORY_CARE_PROVIDER_SITE_OTHER): Payer: Medicare Other

## 2021-11-04 ENCOUNTER — Telehealth: Payer: Self-pay

## 2021-11-04 DIAGNOSIS — I428 Other cardiomyopathies: Secondary | ICD-10-CM

## 2021-11-04 LAB — CUP PACEART REMOTE DEVICE CHECK
Battery Remaining Longevity: 0 mo
Battery Voltage: 2.59 V
Brady Statistic AP VP Percent: 97 %
Brady Statistic AP VS Percent: 1 %
Brady Statistic AS VP Percent: 3 %
Brady Statistic AS VS Percent: 1 %
Brady Statistic RA Percent Paced: 97 %
Date Time Interrogation Session: 20231027061740
Implantable Lead Connection Status: 753985
Implantable Lead Connection Status: 753985
Implantable Lead Connection Status: 753985
Implantable Lead Implant Date: 20160310
Implantable Lead Implant Date: 20160310
Implantable Lead Implant Date: 20160310
Implantable Lead Location: 753858
Implantable Lead Location: 753859
Implantable Lead Location: 753860
Implantable Lead Model: 1948
Implantable Pulse Generator Implant Date: 20160310
Lead Channel Impedance Value: 410 Ohm
Lead Channel Impedance Value: 550 Ohm
Lead Channel Impedance Value: 730 Ohm
Lead Channel Pacing Threshold Amplitude: 0.75 V
Lead Channel Pacing Threshold Amplitude: 0.75 V
Lead Channel Pacing Threshold Amplitude: 0.75 V
Lead Channel Pacing Threshold Pulse Width: 0.5 ms
Lead Channel Pacing Threshold Pulse Width: 0.5 ms
Lead Channel Pacing Threshold Pulse Width: 0.5 ms
Lead Channel Sensing Intrinsic Amplitude: 12 mV
Lead Channel Sensing Intrinsic Amplitude: 2.4 mV
Lead Channel Setting Pacing Amplitude: 2 V
Lead Channel Setting Pacing Amplitude: 2.5 V
Lead Channel Setting Pacing Amplitude: 2.5 V
Lead Channel Setting Pacing Pulse Width: 0.5 ms
Lead Channel Setting Pacing Pulse Width: 0.5 ms
Lead Channel Setting Sensing Sensitivity: 2 mV
Pulse Gen Model: 3242
Pulse Gen Serial Number: 7724805

## 2021-11-04 NOTE — Telephone Encounter (Signed)
Received following alert from [employer]:  Scheduled remote reviewed. Normal device function.   Device reached ERI 10/8 - route to triage Next remote to be determined [place]  Patient has an appointment with Dr. Myles Gip on 11/07/21.  Will discuss gen change at that time.  Will flag for Dr. Karel Jarvis nurse and add to appt notes.

## 2021-11-07 ENCOUNTER — Encounter: Payer: Self-pay | Admitting: Cardiovascular Disease

## 2021-11-07 ENCOUNTER — Ambulatory Visit: Payer: Medicare Other | Attending: Cardiovascular Disease | Admitting: Cardiovascular Disease

## 2021-11-07 VITALS — BP 118/78 | HR 60 | Ht 74.0 in | Wt 214.0 lb

## 2021-11-07 DIAGNOSIS — I480 Paroxysmal atrial fibrillation: Secondary | ICD-10-CM | POA: Diagnosis not present

## 2021-11-07 DIAGNOSIS — Z45010 Encounter for checking and testing of cardiac pacemaker pulse generator [battery]: Secondary | ICD-10-CM

## 2021-11-07 NOTE — Patient Instructions (Signed)
Medication Instructions:  Your physician recommends that you continue on your current medications as directed. Please refer to the Current Medication list given to you today.  *If you need a refill on your cardiac medications before your next appointment, please call your pharmacy*   Lab Work: TODAY: BMET   If you have labs (blood work) drawn today and your tests are completely normal, you will receive your results only by: [place] (if you have MyChart) OR A paper copy in the mail If you have any lab test that is abnormal or we need to change your treatment, we will call you to review the results.   Follow-Up: At [facility], you and your health needs are our priority.  As part of our continuing mission to provide you with exceptional heart care, we have created designated Provider Care Teams.  These Care Teams include your primary Cardiologist (physician) and Advanced Practice Providers (APPs -  Physician Assistants and Nurse Practitioners) who all work together to provide you with the care you need, when you need it.  We recommend signing up for the patient portal called "MyChart".  Sign up information is provided on this After Visit Summary.  MyChart is used to connect with patients for Virtual Visits (Telemedicine).  Patients are able to view lab/test results, encounter notes, upcoming appointments, etc.  Non-urgent messages can be sent to your provider as well.   To learn more about what you can do with MyChart, go to [email or web address].    Your next appointment:   We will call you to schedule  Other Instructions See letter for Pacemaker generator change  Important Information About Sugar

## 2021-11-07 NOTE — Progress Notes (Signed)
Cardiology Office Note:    Date:  11/07/2021   ID:  Phillip Booth, DOB Jan 21, 1950, MRN [id number]  PCP:  Phillip Booth, [place] Providers Cardiologist:  Phillip Furbish, MD Cardiology APP:  Phillip Berthold, NP (Inactive)  Electrophysiologist:  Phillip Grayer, MD     Referring MD: Phillip Fanny, DO   Chief complaint: device battery at ERI  History of Present Illness:    Phillip Booth is a 71 y.o. male with a hx of CAD, non-ischemic cardiomyopathy, paroxysmal AF, symptomatic bradycardia due to sinus node dysfunction and AV block.  He has an Abbott BiV pacemaker placed by Dr. Rayann Booth.  he has no device related complaints -- no new tenderness, drainage, redness.   Past Medical History:  Diagnosis Date   Allergy    CAD (coronary artery disease)    a. moderate by cath 03/2014   Cataract    Cervical strain 03/25/2020   CVA (cerebral infarction)    a. diagnosis not clear   Hyperlipidemia    Hypertension    Non-ischemic cardiomyopathy ([place])    a. EF 45% by echo 03/2014   Paroxysmal atrial fibrillation ([place]) 05/01/2014   asymptomatic, documented on PPM interrogation,  chads2vasc score is at least 6.  He is contemplating anticoagulation   Symptomatic bradycardia    a. s/p STJ CRTP implanted by Dr Phillip Booth   Vertigo     Past Surgical History:  Procedure Laterality Date   ABDOMINAL SURGERY     APPENDECTOMY     BI-VENTRICULAR PACEMAKER INSERTION N/A 03/19/2014   STJ CRTP implanted by Dr Phillip Booth   EYE SURGERY     Cataract removed from Right eye   [place] WITH CORONARY ANGIOGRAM N/A 03/19/2014   Procedure: LEFT HEART CATHETERIZATION WITH CORONARY ANGIOGRAM;  Surgeon: Phillip Sine, MD;  Location[facility] C[facility] LAB;  Service: Cardiovascular;  Laterality: N/A;    Current Medications: Current Meds  Medication Sig   acetaminophen (TYLENOL) 500 MG tablet Take 1 tablet (500 mg total) by mouth every 6 (six) hours as needed for moderate pain.    albuterol (VENTOLIN HFA) 108 (90 Base) MCG/ACT inhaler INHALE 2 PUFFS INTO THE LUNGS EVERY 6 (SIX) HOURS AS NEEDED FOR WHEEZING OR SHORTNESS OF BREATH.   aspirin 81 MG tablet Take 81 mg by mouth daily.    azelastine (ASTELIN) 0.1 % nasal spray Place 2 sprays into both nostrils 2 (two) times daily. Use in each nostril as directed   carvedilol (COREG) 6.25 MG tablet TAKE 1 TABLET BY MOUTH TWICE DAILY WITH A MEAL   diclofenac Sodium (VOLTAREN) 1 % GEL Apply 4 g topically 4 (four) times daily.   fluticasone (FLONASE) 50 MCG/ACT nasal spray Place 2 sprays into both nostrils daily.   fluticasone (FLOVENT HFA) 110 MCG/ACT inhaler Inhale 2 puffs into the lungs 2 (two) times daily.   furosemide (LASIX) 20 MG tablet TAKE 1 TABLET BY MOUTH DAILY   isosorbide mononitrate (IMDUR) 30 MG 24 hr tablet TAKE 1 TABLET (30 MG TOTAL) BY MOUTH DAILY.   ketorolac (ACULAR) 0.5 % ophthalmic solution    levocetirizine (XYZAL) 5 MG tablet Take 1 tablet (5 mg total) by mouth every evening.   lidocaine (LIDODERM) 5 % Place 1 patch onto the skin daily. Remove & Discard patch within 12 hours or as directed by MD   lisinopril (ZESTRIL) 40 MG tablet TAKE 1 TABLET (40 MG TOTAL) BY MOUTH DAILY.   metFORMIN (GLUCOPHAGE-XR) 500  MG 24 hr tablet Take 500 mg by mouth daily.   montelukast (SINGULAIR) 10 MG tablet Take 1 tablet (10 mg total) by mouth at bedtime.   rosuvastatin (CRESTOR) 10 MG tablet Take 1 tablet (10 mg total) by mouth daily.   tiZANidine (ZANAFLEX) 2 MG tablet Take 1 tablet (2 mg total) by mouth every 6 (six) hours as needed for muscle spasms.     Allergies:   Lactose intolerance (gi)   Social History   Socioeconomic History   Marital status: Married    Spouse name: Not on file   Number of children: Not on file   Years of education: Not on file   Highest education level: Not on file  Occupational History   Not on file  Tobacco Use   Smoking status: Never   Smokeless tobacco: Never  Vaping Use   Vaping  Use: Never used  Substance and Sexual Activity   Alcohol use: Not Currently    Comment: occasional   Drug use: No   Sexual activity: Not on file  Other Topics Concern   Not on file  Social History Narrative   Not on file   Social Determinants of Health   Financial Resource Strain: Not on file  Food Insecurity: Not on file  Transportation Needs: No Transportation Needs (06/17/2019)   PRAPARE - Hydrologist (Medical): No    Lack of Transportation (Non-Medical): No  Physical Activity: Not on file  Stress: Not on file  Social Connections: Not on file     Family History: The patient's family history includes Cancer in his mother; Diabetes in his mother; Emphysema in his father; Heart Problems in his brother and another family member; Heart disease in his mother; Hypertension in his father and mother; Other in his mother.  ROS:   Please see the history of present illness.    All other systems reviewed and are negative.  EKGs/Labs/Other Studies Reviewed:     10.7.2022 EF 45-50%  EKG:  Last EKG results: EF 45-50%   Recent Labs: No results found for requested labs within last 365 days.     Physical Exam:    VS:  BP 118/78   Pulse 60   Ht '6\' 2"'$  (1.88 m)   Wt 214 lb (97.1 kg)   SpO2 97%   BMI 27.48 kg/m     Wt Readings from Last 3 Encounters:  11/07/21 214 lb (97.1 kg)  10/03/21 218 lb (98.9 kg)  09/01/21 218 lb 9.6 oz (99.2 kg)     GEN:  Well nourished, well developed in no acute distress CARDIAC: RRR, no murmurs, rubs, gallops RESPIRATORY:  Normal work of breathing MUSCULOSKELETAL: no edema    ASSESSMENT & PLAN:    Symptomatic bradycardia: Abbott BiV pacemaker: device is at ERI. Will schedule generator change. I described the procedure to him, detailing the indication and rationale, the risks of the procedure which include but are not limited to: infection, bleeding, damage to the existing wires. Nonischemic cardiomyopathy with  moderately reduced EF: Continue lisinopril 40 mg daily, carvedilol 6.25 mg Paroxysmal AF: detected on device interrogation. Continue eliquis for secondary hypercoagulable state.      Signed, Phillip Quitter, MD  11/07/2021 12:16 PM    [place]

## 2021-11-08 LAB — BASIC METABOLIC PANEL
BUN/Creatinine Ratio: 15 (ref 10–24)
BUN: 14 mg/dL (ref 8–27)
CO2: 21 mmol/L (ref 20–29)
Calcium: 9.9 mg/dL (ref 8.6–10.2)
Chloride: 104 mmol/L (ref 96–106)
Creatinine, Ser: 0.92 mg/dL (ref 0.76–1.27)
Glucose: 90 mg/dL (ref 70–99)
Potassium: 4.6 mmol/L (ref 3.5–5.2)
Sodium: 137 mmol/L (ref 134–144)
eGFR: 88 mL/min/{1.73_m2} (ref >59)

## 2021-11-08 LAB — CBC
Hematocrit: 42.4 % (ref 37.5–51.0)
Hemoglobin: 13.9 g/dL (ref 13.0–17.7)
MCH: 30.5 pg (ref 26.6–33.0)
MCHC: 32.8 g/dL (ref 31.5–35.7)
MCV: 93 fL (ref 79–97)
Platelets: 177 10*3/uL (ref 150–450)
RBC: 4.55 x10E6/uL (ref 4.14–5.80)
RDW: 13 % (ref 11.6–15.4)
WBC: 3.6 10*3/uL (ref 3.4–10.8)

## 2021-11-09 NOTE — Progress Notes (Signed)
Remote pacemaker transmission.   

## 2021-11-28 ENCOUNTER — Ambulatory Visit (INDEPENDENT_AMBULATORY_CARE_PROVIDER_SITE_OTHER): Payer: Medicare Other

## 2021-11-28 DIAGNOSIS — I5042 Chronic combined systolic (congestive) and diastolic (congestive) heart failure: Secondary | ICD-10-CM | POA: Diagnosis not present

## 2021-11-28 DIAGNOSIS — Z95810 Presence of automatic (implantable) cardiac defibrillator: Secondary | ICD-10-CM | POA: Diagnosis not present

## 2021-11-30 NOTE — Progress Notes (Signed)
EPIC Encounter for ICM Monitoring  Patient Name: Phillip Booth is a 71 y.o. male Date: 11/30/2021 Primary Care Physican: Wynelle Fanny, DO Primary Cardiologist: Marlou Porch Electrophysiologist: Mealor Bi-V Pacing:  >99%      05/04/2021 Weight: 218 lbs  10/03/2021 Weight: 218-220 lbs.  11/30/2021 Weight: 220 lbs   AT/AF Burden: 0% (no OAC)   Spoke with patient and heart failure questions reviewed.  Transmission results reviewed.  Pt asymptomatic for fluid accumulation.  Reports feeling well at this time and voices no complaints.   Battery change scheduled for 11/29.   Corvue thoracic impedance suggesting normal fluid levels with exception possible fluid accumulation from 11/4-11/11.   Prescribed: Furosemide 20 mg 1 tablet daily   Labs: 11/07/2021 Creatinine 0.92, BUN 14, Potassium 4.6, Sodium 137, GFR 88 A complete set of results can be found in Results Review.   Recommendations:   No changes and encouraged to call if experiencing any fluid symptoms.   Follow-up plan: ICM clinic phone appointment on 01/30/2022 (provide time for thoracic baseline to develop after battery change).   91 day device clinic remote transmission pending battery change.     EP/Cardiology Office Visits:  03/13/2022 with Dr Myles Gip.    Copy of ICM check sent to Dr. Myles Gip.   3 month ICM trend: 11/28/2021.    12-14 Month ICM trend:     Rosalene Billings, RN 11/30/2021 7:23 AM

## 2021-12-05 ENCOUNTER — Ambulatory Visit (INDEPENDENT_AMBULATORY_CARE_PROVIDER_SITE_OTHER): Payer: Medicare Other

## 2021-12-05 DIAGNOSIS — I5042 Chronic combined systolic (congestive) and diastolic (congestive) heart failure: Secondary | ICD-10-CM

## 2021-12-05 LAB — CUP PACEART REMOTE DEVICE CHECK
Battery Remaining Longevity: 0 mo
Battery Voltage: 2.57 V
Brady Statistic AP VP Percent: 97 %
Brady Statistic AP VS Percent: 1 %
Brady Statistic AS VP Percent: 2.8 %
Brady Statistic AS VS Percent: 1 %
Brady Statistic RA Percent Paced: 97 %
Date Time Interrogation Session: 20231127020016
Implantable Lead Connection Status: 753985
Implantable Lead Connection Status: 753985
Implantable Lead Connection Status: 753985
Implantable Lead Implant Date: 20160310
Implantable Lead Implant Date: 20160310
Implantable Lead Implant Date: 20160310
Implantable Lead Location: 753858
Implantable Lead Location: 753859
Implantable Lead Location: 753860
Implantable Lead Model: 1948
Implantable Pulse Generator Implant Date: 20160310
Lead Channel Impedance Value: 410 Ohm
Lead Channel Impedance Value: 540 Ohm
Lead Channel Impedance Value: 730 Ohm
Lead Channel Pacing Threshold Amplitude: 1 V
Lead Channel Pacing Threshold Amplitude: 1 V
Lead Channel Pacing Threshold Amplitude: 1 V
Lead Channel Pacing Threshold Pulse Width: 0.5 ms
Lead Channel Pacing Threshold Pulse Width: 0.5 ms
Lead Channel Pacing Threshold Pulse Width: 0.5 ms
Lead Channel Sensing Intrinsic Amplitude: 12 mV
Lead Channel Sensing Intrinsic Amplitude: 2.5 mV
Lead Channel Setting Pacing Amplitude: 2 V
Lead Channel Setting Pacing Amplitude: 2.5 V
Lead Channel Setting Pacing Amplitude: 2.5 V
Lead Channel Setting Pacing Pulse Width: 0.5 ms
Lead Channel Setting Pacing Pulse Width: 0.5 ms
Lead Channel Setting Sensing Sensitivity: 2 mV
Pulse Gen Model: 3242
Pulse Gen Serial Number: 7724805

## 2021-12-06 NOTE — Pre-Procedure Instructions (Signed)
Instructed patient on the following items: Arrival time 1230 Nothing to eat or drink after midnight No meds AM of procedure Responsible person to drive you home and stay with you for 24 hrs Wash with special soap night before and morning of procedure  

## 2021-12-07 ENCOUNTER — Ambulatory Visit (HOSPITAL_COMMUNITY)
Admission: RE | Disposition: A | Discharge status: Home / Self Care | Payer: Self-pay | Source: Home / Self Care | Attending: Cardiovascular Disease

## 2021-12-07 ENCOUNTER — Ambulatory Visit (HOSPITAL_COMMUNITY)
Admission: RE | Admit: 2021-12-07 | Discharge: 2021-12-07 | Disposition: A | Discharge status: Home / Self Care | Payer: Medicare Other | Attending: Cardiovascular Disease | Admitting: Cardiovascular Disease

## 2021-12-07 ENCOUNTER — Other Ambulatory Visit: Payer: Self-pay

## 2021-12-07 DIAGNOSIS — I2510 Atherosclerotic heart disease of native coronary artery without angina pectoris: Secondary | ICD-10-CM | POA: Insufficient documentation

## 2021-12-07 DIAGNOSIS — Z45010 Encounter for checking and testing of cardiac pacemaker pulse generator [battery]: Secondary | ICD-10-CM | POA: Insufficient documentation

## 2021-12-07 DIAGNOSIS — Z7901 Long term (current) use of anticoagulants: Secondary | ICD-10-CM | POA: Diagnosis not present

## 2021-12-07 DIAGNOSIS — I495 Sick sinus syndrome: Secondary | ICD-10-CM | POA: Diagnosis not present

## 2021-12-07 DIAGNOSIS — I428 Other cardiomyopathies: Secondary | ICD-10-CM | POA: Insufficient documentation

## 2021-12-07 DIAGNOSIS — I480 Paroxysmal atrial fibrillation: Secondary | ICD-10-CM | POA: Diagnosis not present

## 2021-12-07 DIAGNOSIS — Z79899 Other long term (current) drug therapy: Secondary | ICD-10-CM | POA: Insufficient documentation

## 2021-12-07 DIAGNOSIS — D6869 Other thrombophilia: Secondary | ICD-10-CM | POA: Diagnosis not present

## 2021-12-07 HISTORY — PX: PPM GENERATOR CHANGEOUT: EP1233

## 2021-12-07 LAB — NO BLOOD PRODUCTS

## 2021-12-07 SURGERY — PPM GENERATOR CHANGEOUT

## 2021-12-07 MED ORDER — FENTANYL CITRATE (PF) 100 MCG/2ML IJ SOLN
INTRAMUSCULAR | Status: AC
  Filled 2021-12-07: qty 2

## 2021-12-07 MED ORDER — POVIDONE-IODINE 10 % EX SWAB
2.0000 | Freq: Once | CUTANEOUS | Status: AC
  Administered 2021-12-07: 2 via TOPICAL

## 2021-12-07 MED ORDER — ACETAMINOPHEN 325 MG PO TABS: 325.0000 mg | ORAL_TABLET | ORAL | Status: DC | PRN

## 2021-12-07 MED ORDER — CEFAZOLIN SODIUM-DEXTROSE 2-4 GM/100ML-% IV SOLN
INTRAVENOUS | Status: AC
  Filled 2021-12-07: qty 100

## 2021-12-07 MED ORDER — ONDANSETRON HCL 4 MG/2ML IJ SOLN: 4.0000 mg | Freq: Four times a day (QID) | INTRAMUSCULAR | Status: DC | PRN

## 2021-12-07 MED ORDER — CHLORHEXIDINE GLUCONATE 4 % EX LIQD: 4.0000 | Freq: Once | CUTANEOUS | Status: DC

## 2021-12-07 MED ORDER — LIDOCAINE HCL (PF) 1 % IJ SOLN
INTRAMUSCULAR | Status: DC | PRN
  Administered 2021-12-07: 60 mL

## 2021-12-07 MED ORDER — MIDAZOLAM HCL 5 MG/5ML IJ SOLN
INTRAMUSCULAR | Status: AC
  Filled 2021-12-07: qty 5

## 2021-12-07 MED ORDER — HYDROMORPHONE HCL 1 MG/ML IJ SOLN: 1.0000 mg | Freq: Once | INTRAMUSCULAR | Status: DC

## 2021-12-07 MED ORDER — CEFAZOLIN SODIUM-DEXTROSE 2-4 GM/100ML-% IV SOLN
2.0000 g | INTRAVENOUS | Status: AC
  Administered 2021-12-07: 2 g via INTRAVENOUS

## 2021-12-07 MED ORDER — SODIUM CHLORIDE 0.9 % IV SOLN: INTRAVENOUS | Status: DC

## 2021-12-07 MED ORDER — FENTANYL CITRATE (PF) 100 MCG/2ML IJ SOLN
INTRAMUSCULAR | Status: DC | PRN
  Administered 2021-12-07: 50 ug via INTRAVENOUS

## 2021-12-07 MED ORDER — SODIUM CHLORIDE 0.9 % IV SOLN
80.0000 mg | INTRAVENOUS | Status: AC
  Administered 2021-12-07: 80 mg

## 2021-12-07 MED ORDER — HEPARIN (PORCINE) IN NACL 1000-0.9 UT/500ML-% IV SOLN
INTRAVENOUS | Status: AC
  Filled 2021-12-07: qty 500

## 2021-12-07 MED ORDER — MIDAZOLAM HCL 5 MG/5ML IJ SOLN
INTRAMUSCULAR | Status: DC | PRN
  Administered 2021-12-07: 1 mg via INTRAVENOUS

## 2021-12-07 MED ORDER — SODIUM CHLORIDE 0.9 % IV SOLN
INTRAVENOUS | Status: AC
  Filled 2021-12-07: qty 2

## 2021-12-07 SURGICAL SUPPLY — 7 items
CABLE SURGICAL S-101-97-12 (CABLE) ×2 IMPLANT
NDL ECHOTIP 22X10 (NEEDLE) IMPLANT
NEEDLE ECHOTIP 22X10 (NEEDLE) ×1 IMPLANT
PACEMAKER QUDR ALLR CRT PM3562 (Pacemaker) IMPLANT
PAD DEFIB RADIO PHYSIO CONN (PAD) ×2 IMPLANT
PMKR QUADRA ALLURE CRT PM3562 (Pacemaker) ×1 IMPLANT
TRAY PACEMAKER INSERTION (PACKS) ×2 IMPLANT

## 2021-12-07 NOTE — Discharge Instructions (Signed)

## 2021-12-07 NOTE — H&P (Signed)
Cardiology Office Note:    Date:  12/07/2021   ID:  [name], DOB 1950-03-25, MRN [id number]  PCP:  Wynelle Fanny, [place] Providers Cardiologist:  Candee Furbish, MD Cardiology APP:  Patsey Berthold, NP (Inactive)  Electrophysiologist:  [name], MD     Referring MD: No ref. provider found   Chief complaint: device battery at ERI  History of Present Illness:    Phillip Booth is a 71 y.o. male with a hx of CAD, non-ischemic cardiomyopathy, paroxysmal AF, symptomatic bradycardia due to sinus node dysfunction and AV block.  He has an Abbott BiV pacemaker placed by Dr. Rayann Heman.  he has no device related complaints -- no new tenderness, drainage, redness.  He presents today for gen change.    Past Medical History:  Diagnosis Date   Allergy    CAD (coronary artery disease)    a. moderate by cath 03/2014   Cataract    Cervical strain 03/25/2020   CVA (cerebral infarction)    a. diagnosis not clear   Hyperlipidemia    Hypertension    Non-ischemic cardiomyopathy ([place])    a. EF 45% by echo 03/2014   Paroxysmal atrial fibrillation ([place]) 05/01/2014   asymptomatic, documented on PPM interrogation,  chads2vasc score is at least 6.  He is contemplating anticoagulation   Symptomatic bradycardia    a. s/p STJ CRTP implanted by Dr Rayann Heman   Vertigo     Past Surgical History:  Procedure Laterality Date   ABDOMINAL SURGERY     APPENDECTOMY     BI-VENTRICULAR PACEMAKER INSERTION N/A 03/19/2014   STJ CRTP implanted by Dr Rayann Heman   EYE SURGERY     Cataract removed from Right eye   [place] WITH CORONARY ANGIOGRAM N/A 03/19/2014   Procedure: LEFT HEART CATHETERIZATION WITH CORONARY ANGIOGRAM;  Surgeon: [name], MD;  Location[facility] CATH LAB;  Service: Cardiovascular;  Laterality: N/A;    Current Medications: Current Meds  Medication Sig   amLODipine (NORVASC) 10 MG tablet Take 1 tablet (10 mg total) by mouth daily.    aspirin 81 MG tablet Take 81 mg by mouth daily.    carvedilol (COREG) 6.25 MG tablet TAKE 1 TABLET BY MOUTH TWICE DAILY WITH A MEAL   isosorbide mononitrate (IMDUR) 30 MG 24 hr tablet TAKE 1 TABLET (30 MG TOTAL) BY MOUTH DAILY.   lisinopril (ZESTRIL) 40 MG tablet TAKE 1 TABLET (40 MG TOTAL) BY MOUTH DAILY.   montelukast (SINGULAIR) 10 MG tablet Take 1 tablet (10 mg total) by mouth at bedtime.   rosuvastatin (CRESTOR) 10 MG tablet Take 1 tablet (10 mg total) by mouth daily.   sodium chloride (OCEAN) 0.65 % SOLN nasal spray Place 1 spray into both nostrils as needed for congestion.   tamsulosin (FLOMAX) 0.4 MG CAPS capsule Take 0.4 mg by mouth daily.     Allergies:   Lactose intolerance (gi)   Social History   Socioeconomic History   Marital status: Married    Spouse name: Not on file   Number of children: Not on file   Years of education: Not on file   Highest education level: Not on file  Occupational History   Not on file  Tobacco Use   Smoking status: Never   Smokeless tobacco: Never  Vaping Use   Vaping Use: Never used  Substance and Sexual Activity   Alcohol use: Not Currently    Comment: occasional  Drug use: No   Sexual activity: Not on file  Other Topics Concern   Not on file  Social History Narrative   Not on file   Social Determinants of Health   Financial Resource Strain: Not on file  Food Insecurity: Not on file  Transportation Needs: No Transportation Needs (06/17/2019)   PRAPARE - Hydrologist (Medical): No    Lack of Transportation (Non-Medical): No  Physical Activity: Not on file  Stress: Not on file  Social Connections: Not on file     Family History: The patient's family history includes Cancer in his mother; Diabetes in his mother; Emphysema in his father; Heart Problems in his brother and another family member; Heart disease in his mother; Hypertension in his father and mother; Other in his mother.  ROS:   Please see  the history of present illness.    All other systems reviewed and are negative.  EKGs/Labs/Other Studies Reviewed:     10.7.2022 EF 45-50%  EKG:  Last EKG results: EF 45-50%   Recent Labs: 11/07/2021: BUN 14; Creatinine, Ser 0.92; Hemoglobin 13.9; Platelets 177; Potassium 4.6; Sodium 137     Physical Exam:    VS:  BP 120/82   Pulse (!) 57   Temp 97.6 F (36.4 C) (Temporal)   Resp 16   Ht '6\' 4"'$  (1.93 m)   Wt 99.8 kg   SpO2 99%   BMI 26.78 kg/m     Wt Readings from Last 3 Encounters:  12/07/21 99.8 kg  11/07/21 97.1 kg  10/03/21 98.9 kg     GEN:  Well nourished, well developed in no acute distress CARDIAC: RRR, no murmurs, rubs, gallops RESPIRATORY:  Normal work of breathing MUSCULOSKELETAL: no edema    ASSESSMENT & PLAN:    Symptomatic bradycardia: Abbott BiV pacemaker: device is at ERI. Here for generator change. He understands the risks and is ready to proceed.  Nonischemic cardiomyopathy with moderately reduced EF: Continue lisinopril 40 mg daily, carvedilol 6.25 mg Paroxysmal AF: detected on device interrogation. Continue eliquis for secondary hypercoagulable state.      Signed, [name], MD  12/07/2021 2:16 PM    [place]

## 2021-12-08 ENCOUNTER — Telehealth: Payer: Self-pay | Admitting: Cardiovascular Disease

## 2021-12-08 ENCOUNTER — Encounter (HOSPITAL_COMMUNITY): Payer: Self-pay | Admitting: Cardiovascular Disease

## 2021-12-08 MED FILL — Heparin Sod (Porcine)-NaCl IV Soln 1000 Unit/500ML-0.9%: INTRAVENOUS | Qty: 500 | Status: AC

## 2021-12-08 NOTE — Telephone Encounter (Signed)
Patient stated he has questions regarding his post procedure instructions.

## 2021-12-08 NOTE — Telephone Encounter (Signed)
Spoke with the patient who had question about driving after his procedure. 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 He states he needs to go out to pick up her medications at some point. Advised per discharge instructions he could drive after 24 hours. Patient verbalized understanding.

## 2021-12-20 ENCOUNTER — Emergency Department (HOSPITAL_BASED_OUTPATIENT_CLINIC_OR_DEPARTMENT_OTHER)
Admission: EM | Admit: 2021-12-20 | Discharge: 2021-12-20 | Disposition: A | Discharge status: Home / Self Care | Payer: Medicare Other | Attending: Emergency Medicine | Admitting: Emergency Medicine

## 2021-12-20 ENCOUNTER — Other Ambulatory Visit: Payer: Self-pay

## 2021-12-20 ENCOUNTER — Encounter (HOSPITAL_BASED_OUTPATIENT_CLINIC_OR_DEPARTMENT_OTHER): Payer: Self-pay | Admitting: Emergency Medicine

## 2021-12-20 DIAGNOSIS — Z1152 Encounter for screening for COVID-19: Secondary | ICD-10-CM | POA: Insufficient documentation

## 2021-12-20 DIAGNOSIS — I2510 Atherosclerotic heart disease of native coronary artery without angina pectoris: Secondary | ICD-10-CM | POA: Diagnosis not present

## 2021-12-20 DIAGNOSIS — Z7982 Long term (current) use of aspirin: Secondary | ICD-10-CM | POA: Diagnosis not present

## 2021-12-20 DIAGNOSIS — I10 Essential (primary) hypertension: Secondary | ICD-10-CM | POA: Diagnosis not present

## 2021-12-20 DIAGNOSIS — J205 Acute bronchitis due to respiratory syncytial virus: Secondary | ICD-10-CM | POA: Insufficient documentation

## 2021-12-20 DIAGNOSIS — Z79899 Other long term (current) drug therapy: Secondary | ICD-10-CM | POA: Diagnosis not present

## 2021-12-20 DIAGNOSIS — R059 Cough, unspecified: Secondary | ICD-10-CM | POA: Diagnosis present

## 2021-12-20 LAB — RESP PANEL BY RT-PCR (RSV, FLU A&B, COVID)  RVPGX2
Influenza A by PCR: NEGATIVE
Influenza B by PCR: NEGATIVE
Resp Syncytial Virus by PCR: POSITIVE — AB
SARS Coronavirus 2 by RT PCR: NEGATIVE

## 2021-12-20 MED ORDER — ALBUTEROL SULFATE HFA 108 (90 BASE) MCG/ACT IN AERS
2.0000 | INHALATION_SPRAY | RESPIRATORY_TRACT | Status: DC | PRN
  Administered 2021-12-20: 2 via RESPIRATORY_TRACT
  Filled 2021-12-20: qty 6.7

## 2021-12-20 MED ORDER — GUAIFENESIN 100 MG/5ML PO LIQD
15.0000 mL | Freq: Once | ORAL | Status: AC
  Administered 2021-12-20: 15 mL via ORAL
  Filled 2021-12-20: qty 20

## 2021-12-20 MED ORDER — DEXAMETHASONE SODIUM PHOSPHATE 10 MG/ML IJ SOLN
10.0000 mg | Freq: Once | INTRAMUSCULAR | Status: AC
  Administered 2021-12-20: 10 mg via INTRAMUSCULAR
  Filled 2021-12-20: qty 1

## 2021-12-20 MED ORDER — DM-GUAIFENESIN ER 30-600 MG PO TB12: 1.0000 | ORAL_TABLET | Freq: Two times a day (BID) | ORAL | 0 refills | Status: DC | PRN

## 2021-12-20 NOTE — ED Provider Notes (Signed)
[place] DEPT [facility] Provider Note: Georgena Spurling, MD, FACEP  CSN: [id number] MRN: [id number] ARRIVAL: 12/20/21 at [place]: [place]  URI   HISTORY OF PRESENT ILLNESS  12/20/21 6:01 AM Phillip Booth is a 71 y.o. male with 3 days of cough and shortness of breath.  On the first day he had some chills and bodyaches but he is not aware of having had a fever.  He feels like he is having difficulty pulling air into his lungs and the cough is preventing him from sleeping.  He recently received a flu shot and had a negative COVID test about a week ago.   Past Medical History:  Diagnosis Date   Allergy    CAD (coronary artery disease)    a. moderate by cath 03/2014   Cataract    Cervical strain 03/25/2020   CVA (cerebral infarction)    a. diagnosis not clear   Hyperlipidemia    Hypertension    Non-ischemic cardiomyopathy ([place])    a. EF 45% by echo 03/2014   Paroxysmal atrial fibrillation ([place]) 05/01/2014   asymptomatic, documented on PPM interrogation,  chads2vasc score is at least 6.  He is contemplating anticoagulation   Symptomatic bradycardia    a. s/p STJ CRTP implanted by Dr Rayann Heman   Vertigo     Past Surgical History:  Procedure Laterality Date   ABDOMINAL SURGERY     APPENDECTOMY     BI-VENTRICULAR PACEMAKER INSERTION N/A 03/19/2014   STJ CRTP implanted by Dr Rayann Heman   EYE SURGERY     Cataract removed from Right eye   [place] WITH CORONARY ANGIOGRAM N/A 03/19/2014   Procedure: LEFT HEART CATHETERIZATION WITH CORONARY ANGIOGRAM;  Surgeon: [name], MD;  Location: [facility] CATH LAB;  Service: Cardiovascular;  Laterality: N/A;   PPM GENERATOR CHANGEOUT N/A 12/07/2021   Procedure: PPM GENERATOR CHANGEOUT;  Surgeon: Myles Gip, Yetta Barre, MD;  Location: [place] CV LAB;  Service: Cardiovascular;  Laterality: N/A;    Family History  Problem Relation Age of Onset   Hypertension Mother    Cancer Mother        brain  (possibly started in lung)   Heart disease Mother    Diabetes Mother    Other Mother        cardiomegaly   Hypertension Father    Emphysema Father    Heart Problems Brother        pacemaker   Heart Problems Other        "using 10% of heart"    Social History   Tobacco Use   Smoking status: Never   Smokeless tobacco: Never  Vaping Use   Vaping Use: Never used  Substance Use Topics   Alcohol use: Not Currently    Comment: occasional   Drug use: No    Prior to Admission medications   Medication Sig Start Date End Date Taking? Authorizing Provider  dextromethorphan-guaiFENesin (MUCINEX DM) 30-600 MG 12hr tablet Take 1 tablet by mouth 2 (two) times daily as needed for cough. 12/20/21  Yes Kasten Leveque, MD  amLODipine (NORVASC) 10 MG tablet Take 1 tablet (10 mg total) by mouth daily. 03/17/19 12/07/21  Verta Ellen., NP  aspirin 81 MG tablet Take 81 mg by mouth daily.     [provider]  carvedilol (COREG) 6.25 MG tablet TAKE 1 TABLET BY MOUTH TWICE DAILY WITH A MEAL 01/01/19   Saguier, Percell Miller, PA-C  isosorbide mononitrate (IMDUR) 30 MG 24 hr tablet TAKE 1 TABLET (30 MG TOTAL) BY MOUTH DAILY. 02/26/19   Shirley Friar, PA-C  lisinopril (ZESTRIL) 40 MG tablet TAKE 1 TABLET (40 MG TOTAL) BY MOUTH DAILY. 11/28/18   Saguier, Percell Miller, PA-C  montelukast (SINGULAIR) 10 MG tablet Take 1 tablet (10 mg total) by mouth at bedtime. 08/20/18   Saguier, Percell Miller, PA-C  rosuvastatin (CRESTOR) 10 MG tablet Take 1 tablet (10 mg total) by mouth daily. 04/18/18   Saguier, Percell Miller, PA-C  sodium chloride (OCEAN) 0.65 % SOLN nasal spray Place 1 spray into both nostrils as needed for congestion.    [provider]  tamsulosin (FLOMAX) 0.4 MG CAPS capsule Take 0.4 mg by mouth daily. 11/13/21   [provider]    Allergies Lactose intolerance (gi)   REVIEW OF SYSTEMS  Negative except as noted here or in the History of Present Illness.   PHYSICAL EXAMINATION  Initial  Vital Signs Blood pressure (!) 140/95, pulse 64, temperature 98.9 F (37.2 C), temperature source Oral, resp. rate 18, height '6\' 4"'$  (1.93 m), weight 99.8 kg, SpO2 100 %.  Examination General: Well-developed, well-nourished male in no acute distress; appearance consistent with age of record HENT: normocephalic; atraumatic Eyes: Normal appearance Neck: supple Heart: regular rate and rhythm Lungs: Decreased air movement bilaterally with faint wheezing in the bases Abdomen: soft; nondistended; nontender; bowel sounds present Extremities: No deformity; full range of motion Neurologic: Awake, alert and oriented; motor function intact in all extremities and symmetric; no facial droop Skin: Warm and dry Psychiatric: Normal mood and affect   RESULTS  Summary of this visit's results, reviewed and interpreted by myself:   EKG Interpretation  Date/Time:    Ventricular Rate:    PR Interval:    QRS Duration:   QT Interval:    QTC Calculation:   R Axis:     Text Interpretation:         Laboratory Studies: Results for orders placed or performed during the hospital encounter of 12/20/21 (from the past 24 hour(s))  Resp panel by RT-PCR (RSV, Flu A&B, Covid) Anterior Nasal Swab     Status: Abnormal   Collection Time: 12/20/21  5:23 AM   Specimen: Anterior Nasal Swab  Result Value Ref Range   SARS Coronavirus 2 by RT PCR NEGATIVE NEGATIVE   Influenza A by PCR NEGATIVE NEGATIVE   Influenza B by PCR NEGATIVE NEGATIVE   Resp Syncytial Virus by PCR POSITIVE (A) NEGATIVE   Imaging Studies: No results found.  ED COURSE and MDM  Nursing notes, initial and subsequent vitals signs, including pulse oximetry, reviewed and interpreted by myself.  Vitals:   12/20/21 0535 12/20/21 0537 12/20/21 0627  BP:  (!) 140/95   Pulse:  64   Resp:  18   Temp:  98.9 F (37.2 C)   TempSrc:  Oral   SpO2:  100% 97%  Weight: 99.8 kg    Height: '6\' 4"'$  (1.93 m)     Medications  albuterol (VENTOLIN HFA)  108 (90 Base) MCG/ACT inhaler 2 puff (2 puffs Inhalation Given 12/20/21 0623)  dexamethasone (DECADRON) injection 10 mg (has no administration in time range)  guaiFENesin (ROBITUSSIN) 100 MG/5ML liquid 15 mL (has no administration in time range)   6:37 AM Air movement improved after albuterol treatment.  Patient was given albuterol inhaler and AeroChamber and instructed in their use.  He was also given an injection of dexamethasone.  He is positive for respiratory syncytial virus.  He was advised that he is potentially contagious.   PROCEDURES  Procedures   ED DIAGNOSES     ICD-10-CM   1. Acute bronchitis due to respiratory syncytial virus (RSV)  J20.5          Sherise Geerdes, MD 12/20/21 [phone]

## 2021-12-20 NOTE — ED Triage Notes (Signed)
URI Sx  X 3 days. Tested - covid last week and got a flu shot. Sx started 3 days after. Unable to sleep due to cough.

## 2021-12-21 ENCOUNTER — Ambulatory Visit: Payer: Medicare Other

## 2021-12-28 ENCOUNTER — Ambulatory Visit: Payer: Medicare Other | Attending: Internal Medicine

## 2021-12-28 DIAGNOSIS — I441 Atrioventricular block, second degree: Secondary | ICD-10-CM

## 2021-12-28 LAB — CUP PACEART INCLINIC DEVICE CHECK
Battery Remaining Longevity: 88 mo
Battery Voltage: 3.05 V
Brady Statistic RA Percent Paced: 98 %
Brady Statistic RV Percent Paced: 99.95 %
Date Time Interrogation Session: 20231220085631
Implantable Lead Connection Status: 753985
Implantable Lead Connection Status: 753985
Implantable Lead Connection Status: 753985
Implantable Lead Implant Date: 20160310
Implantable Lead Implant Date: 20160310
Implantable Lead Implant Date: 20160310
Implantable Lead Location: 753858
Implantable Lead Location: 753859
Implantable Lead Location: 753860
Implantable Lead Model: 1948
Implantable Pulse Generator Implant Date: 20231129
Lead Channel Impedance Value: 400 Ohm
Lead Channel Impedance Value: 537.5 Ohm
Lead Channel Impedance Value: 700 Ohm
Lead Channel Pacing Threshold Amplitude: 0.5 V
Lead Channel Pacing Threshold Amplitude: 0.5 V
Lead Channel Pacing Threshold Amplitude: 0.75 V
Lead Channel Pacing Threshold Amplitude: 0.75 V
Lead Channel Pacing Threshold Amplitude: 1 V
Lead Channel Pacing Threshold Amplitude: 1 V
Lead Channel Pacing Threshold Pulse Width: 0.4 ms
Lead Channel Pacing Threshold Pulse Width: 0.4 ms
Lead Channel Pacing Threshold Pulse Width: 0.4 ms
Lead Channel Pacing Threshold Pulse Width: 0.4 ms
Lead Channel Pacing Threshold Pulse Width: 0.4 ms
Lead Channel Pacing Threshold Pulse Width: 0.4 ms
Lead Channel Sensing Intrinsic Amplitude: 2.5 mV
Lead Channel Sensing Intrinsic Amplitude: 4.5 mV
Lead Channel Setting Pacing Amplitude: 2 V
Lead Channel Setting Pacing Amplitude: 2.5 V
Lead Channel Setting Pacing Amplitude: 2.5 V
Lead Channel Setting Pacing Pulse Width: 0.4 ms
Lead Channel Setting Pacing Pulse Width: 0.4 ms
Lead Channel Setting Sensing Sensitivity: 4 mV
Pulse Gen Model: 3562
Pulse Gen Serial Number: 8124783

## 2021-12-28 NOTE — Progress Notes (Signed)
Wound check appointment. Steri-strips removed. Wound without redness or edema. Incision edges approximated, wound well healed. Normal device function. Thresholds, sensing, and impedances consistent with implant measurements. Device programmed at chronic settings for generator change. Histogram distribution appropriate for patient and level of activity. No mode switches or high ventricular rates noted. Patient educated about wound care, ROV in 3 months with implanting physician.

## 2021-12-28 NOTE — Patient Instructions (Signed)
After Your Pacemaker   Monitor your pacemaker site for redness, swelling, and drainage. Call the device clinic at [phone] if you experience these symptoms or fever/chills.  Your incision was closed with Steri-strips or staples:  You may shower 7 days after your procedure and wash your incision with soap and water. Avoid lotions, ointments, or perfumes over your incision until it is well-healed.  You may use a hot tub or a pool after your wound check appointment if the incision is completely closed.  There are no restrictions in arm movement after your wound check appointment.  You may drive, unless driving has been restricted by your healthcare providers.  Remote monitoring is used to monitor your pacemaker from home. This monitoring is scheduled every 91 days by our office. It allows us to keep an eye on the functioning of your device to ensure it is working properly. You will routinely see your Electrophysiologist annually (more often if necessary).  

## 2021-12-29 ENCOUNTER — Ambulatory Visit: Payer: Medicare Other

## 2022-01-13 NOTE — Progress Notes (Signed)
Remote pacemaker transmission.   

## 2022-01-30 ENCOUNTER — Ambulatory Visit: Payer: Medicare Other | Attending: Cardiology

## 2022-01-31 ENCOUNTER — Telehealth: Payer: Self-pay

## 2022-01-31 NOTE — Telephone Encounter (Signed)
Attempted call to patient to advise that office visit is needed to turn on the option of fluid monitoring on his device (battery replacement 11/29).  No answer and call did not go through. IF patient is reached, will have him call device clinic at [phone] to make device clinic nurse appointment to turn on fluid level recording option.

## 2022-02-10 NOTE — Telephone Encounter (Signed)
Spoke with patient and discussed the CorVue fluid monitoring was not turned on when the device battery was changed.  Advised unable to monitoring monthly fluid levels until the CorVue is turned on which can only be done at the office visit.  Advised he can either make an appointment with device clinic to turn the CorVue on to monitor fluid levels or can wait until his appointment with Dr Myles Gip and can be turned on.  He will wait until 3/4 OV to have CorVue turned on.  Advised to call if he has any fluid symptoms develop.

## 2022-03-07 ENCOUNTER — Ambulatory Visit: Payer: Medicare Other

## 2022-03-07 DIAGNOSIS — I428 Other cardiomyopathies: Secondary | ICD-10-CM

## 2022-03-07 LAB — CUP PACEART REMOTE DEVICE CHECK
Battery Remaining Longevity: 92 mo
Battery Remaining Percentage: 95.5 %
Battery Voltage: 3.02 V
Brady Statistic AP VP Percent: 99 %
Brady Statistic AP VS Percent: 1 %
Brady Statistic AS VP Percent: 1.3 %
Brady Statistic AS VS Percent: 1 %
Brady Statistic RA Percent Paced: 99 %
Date Time Interrogation Session: 20240227035037
Implantable Lead Connection Status: 753985
Implantable Lead Connection Status: 753985
Implantable Lead Connection Status: 753985
Implantable Lead Implant Date: 20160310
Implantable Lead Implant Date: 20160310
Implantable Lead Implant Date: 20160310
Implantable Lead Location: 753858
Implantable Lead Location: 753859
Implantable Lead Location: 753860
Implantable Lead Model: 1948
Implantable Pulse Generator Implant Date: 20231129
Lead Channel Impedance Value: 400 Ohm
Lead Channel Impedance Value: 530 Ohm
Lead Channel Impedance Value: 730 Ohm
Lead Channel Pacing Threshold Amplitude: 0.5 V
Lead Channel Pacing Threshold Amplitude: 0.75 V
Lead Channel Pacing Threshold Amplitude: 1 V
Lead Channel Pacing Threshold Pulse Width: 0.4 ms
Lead Channel Pacing Threshold Pulse Width: 0.4 ms
Lead Channel Pacing Threshold Pulse Width: 0.4 ms
Lead Channel Sensing Intrinsic Amplitude: 2.6 mV
Lead Channel Sensing Intrinsic Amplitude: 4.5 mV
Lead Channel Setting Pacing Amplitude: 2 V
Lead Channel Setting Pacing Amplitude: 2.5 V
Lead Channel Setting Pacing Amplitude: 2.5 V
Lead Channel Setting Pacing Pulse Width: 0.4 ms
Lead Channel Setting Pacing Pulse Width: 0.4 ms
Lead Channel Setting Sensing Sensitivity: 4 mV
Pulse Gen Model: 3562
Pulse Gen Serial Number: 8124783

## 2022-03-13 ENCOUNTER — Encounter: Payer: Self-pay | Admitting: Cardiovascular Disease

## 2022-03-13 ENCOUNTER — Ambulatory Visit: Payer: Medicare Other | Attending: Cardiovascular Disease | Admitting: Cardiovascular Disease

## 2022-03-13 VITALS — BP 128/70 | HR 60 | Ht 76.0 in | Wt 219.0 lb

## 2022-03-13 DIAGNOSIS — I480 Paroxysmal atrial fibrillation: Secondary | ICD-10-CM | POA: Diagnosis not present

## 2022-03-13 DIAGNOSIS — I441 Atrioventricular block, second degree: Secondary | ICD-10-CM

## 2022-03-13 DIAGNOSIS — Z95810 Presence of automatic (implantable) cardiac defibrillator: Secondary | ICD-10-CM

## 2022-03-13 NOTE — Progress Notes (Signed)
Cardiology Office Note:    Date:  03/13/2022   ID:  Phillip Booth, DOB 1951/01/04, MRN [id number]  PCP:  Wynelle Fanny, [place] Providers Cardiologist:  Candee Furbish, MD Cardiology APP:  Patsey Berthold, NP (Inactive)  Electrophysiologist:  [name], MD (Inactive)     Referring MD: Wynelle Fanny, DO    History of Present Illness:    Phillip Booth is a 72 y.o. male with a hx of CAD, non-ischemic cardiomyopathy, paroxysmal AF, symptomatic bradycardia due to sinus node dysfunction and AV block.  He has an Abbott BiV pacemaker placed by Dr. Rayann Heman. The generator was changed in November, 2023.  he has no device related complaints -- no new tenderness, drainage, redness.   Past Medical History:  Diagnosis Date   Allergy    CAD (coronary artery disease)    a. moderate by cath 03/2014   Cataract    Cervical strain 03/25/2020   CVA (cerebral infarction)    a. diagnosis not clear   Hyperlipidemia    Hypertension    Non-ischemic cardiomyopathy ([place])    a. EF 45% by echo 03/2014   Paroxysmal atrial fibrillation ([place]) 05/01/2014   asymptomatic, documented on PPM interrogation,  chads2vasc score is at least 6.  He is contemplating anticoagulation   Symptomatic bradycardia    a. s/p STJ CRTP implanted by Dr Rayann Heman   Vertigo     Past Surgical History:  Procedure Laterality Date   ABDOMINAL SURGERY     APPENDECTOMY     BI-VENTRICULAR PACEMAKER INSERTION N/A 03/19/2014   STJ CRTP implanted by Dr Rayann Heman   EYE SURGERY     Cataract removed from Right eye   [place] WITH CORONARY ANGIOGRAM N/A 03/19/2014   Procedure: LEFT HEART CATHETERIZATION WITH CORONARY ANGIOGRAM;  Surgeon: [name], MD;  Location: [facility] CATH LAB;  Service: Cardiovascular;  Laterality: N/A;   PPM GENERATOR CHANGEOUT N/A 12/07/2021   Procedure: PPM GENERATOR CHANGEOUT;  Surgeon: Myles Gip, Yetta Barre, MD;  Location: [facility] CV LAB;  Service: Cardiovascular;   Laterality: N/A;    Current Medications: Current Meds  Medication Sig   fluticasone (FLOVENT HFA) 110 MCG/ACT inhaler Inhale into the lungs.   furosemide (LASIX) 20 MG tablet Take by mouth.   levocetirizine (XYZAL) 5 MG tablet Take by mouth.     Allergies:   Lactose intolerance (gi)   Social History   Socioeconomic History   Marital status: Married    Spouse name: Not on file   Number of children: Not on file   Years of education: Not on file   Highest education level: Not on file  Occupational History   Not on file  Tobacco Use   Smoking status: Never   Smokeless tobacco: Never  Vaping Use   Vaping Use: Never used  Substance and Sexual Activity   Alcohol use: Not Currently    Comment: occasional   Drug use: No   Sexual activity: Not on file  Other Topics Concern   Not on file  Social History Narrative   Not on file   Social Determinants of Health   Financial Resource Strain: Not on file  Food Insecurity: Not on file  Transportation Needs: No Transportation Needs (06/17/2019)   PRAPARE - Hydrologist (Medical): No    Lack of Transportation (Non-Medical): No  Physical Activity: Not on file  Stress: Not on file  Social Connections: Not  on file     Family History: The patient's family history includes Cancer in his mother; Diabetes in his mother; Emphysema in his father; Heart Problems in his brother and another family member; Heart disease in his mother; Hypertension in his father and mother; Other in his mother.  ROS:   Please see the history of present illness.    All other systems reviewed and are negative.  EKGs/Labs/Other Studies Reviewed:     10.7.2022 EF 45-50%  EKG:  Last EKG results: EF 45-50%  Device was interrogated in clinic today. I reviewed the interrogation. See Paceart for details.  Recent Labs: 11/07/2021: BUN 14; Creatinine, Ser 0.92; Hemoglobin 13.9; Platelets 177; Potassium 4.6; Sodium 137     Physical  Exam:    VS:  BP 128/70   Pulse 60   Ht '6\' 4"'$  (1.93 m)   Wt 219 lb (99.3 kg)   SpO2 99%   BMI 26.66 kg/m     Wt Readings from Last 3 Encounters:  03/13/22 219 lb (99.3 kg)  12/20/21 220 lb (99.8 kg)  12/07/21 220 lb (99.8 kg)     GEN:  Well nourished, well developed in no acute distress CARDIAC: RRR, no murmurs, rubs, gallops RESPIRATORY:  Normal work of breathing MUSCULOSKELETAL: no edema    ASSESSMENT & PLAN:    Symptomatic bradycardia: Abbott BiV pacemaker: normal function. There was a brief episode of AMS due to noise on the atrial lead, lasting 8 seconds. Continue to monitor. Nonischemic cardiomyopathy with moderately reduced EF: Continue lisinopril 40 mg daily, carvedilol 6.25 mg Paroxysmal AF: detected on device interrogation. Continue eliquis for secondary hypercoagulable state.      Signed, [name], MD  03/13/2022 2:44 PM    [place]

## 2022-03-13 NOTE — Patient Instructions (Signed)
Medication Instructions:  Your physician recommends that you continue on your current medications as directed. Please refer to the Current Medication list given to you today.  *If you need a refill on your cardiac medications before your next appointment, please call your pharmacy*  Lab Work: None ordered  Testing/Procedures: None ordered  Follow-Up: At [facility], you and your health needs are our priority.  As part of our continuing mission to provide you with exceptional heart care, we have created designated Provider Care Teams.  These Care Teams include your primary Cardiologist (physician) and Advanced Practice Providers (APPs -  Physician Assistants and Nurse Practitioners) who all work together to provide you with the care you need, when you need it.  Your next appointment:   1 year(s)  The format for your next appointment:   In Person  Provider:   You may see [name], MD or one of the following Advanced Practice Providers on your designated Care Team:   Tommye Standard, [place] Legrand Como "[place]" He[place]o, PA-C [place], [place]

## 2022-04-12 NOTE — Progress Notes (Signed)
Remote pacemaker transmission.   

## 2022-04-24 ENCOUNTER — Encounter: Payer: Self-pay | Admitting: *Deleted

## 2022-05-22 ENCOUNTER — Ambulatory Visit: Payer: Medicare Other | Attending: Cardiology

## 2022-05-22 DIAGNOSIS — I5042 Chronic combined systolic (congestive) and diastolic (congestive) heart failure: Secondary | ICD-10-CM | POA: Diagnosis not present

## 2022-05-22 DIAGNOSIS — Z95810 Presence of automatic (implantable) cardiac defibrillator: Secondary | ICD-10-CM

## 2022-05-24 NOTE — Progress Notes (Signed)
EPIC Encounter for ICM Monitoring  Patient Name: Phillip Booth is a 72 y.o. male Date: 05/24/2022 Primary Care Physican: Verdell Face, DO Primary Cardiologist: Anne Fu Electrophysiologist: Mealor Bi-V Pacing:  >99%      03/13/2022 Weight: 219 lbs   AT/AF Burden: <1% (no OAC)   Spoke with patient and heart failure questions reviewed.  Transmission results reviewed.  Pt asymptomatic for fluid accumulation.  Reports feeling well at this time and voices no complaints.     Corvue thoracic impedance suggesting normal fluid levels with exception possible fluid accumulation from 4/15-4/26.   Prescribed: Furosemide 20 mg 1 tablet daily   Labs: 11/07/2021 Creatinine 0.92, BUN 14, Potassium 4.6, Sodium 137, GFR 88 A complete set of results can be found in Results Review.   Recommendations:   No changes and encouraged to call if experiencing any fluid symptoms.   Follow-up plan: ICM clinic phone appointment on 06/26/2022.   91 day device clinic remote transmission 06/06/2022.     EP/Cardiology Office Visits:  Recall 08/27/2022 with Dr Anne Fu.  Recall 03/08/2023 with Dr Nelly Laurence.    Copy of ICM check sent to Dr. Nelly Laurence.   3 month ICM trend: 05/22/2022.    12-14 Month ICM trend:     Karie Soda, RN 05/24/2022 7:28 AM

## 2022-06-06 ENCOUNTER — Ambulatory Visit (INDEPENDENT_AMBULATORY_CARE_PROVIDER_SITE_OTHER): Payer: Medicare Other

## 2022-06-06 DIAGNOSIS — I480 Paroxysmal atrial fibrillation: Secondary | ICD-10-CM

## 2022-06-06 LAB — CUP PACEART REMOTE DEVICE CHECK
Battery Remaining Longevity: 88 mo
Battery Remaining Percentage: 95.5 %
Battery Voltage: 3.02 V
Brady Statistic AP VP Percent: 99 %
Brady Statistic AP VS Percent: 1 %
Brady Statistic AS VP Percent: 1 %
Brady Statistic AS VS Percent: 1 %
Brady Statistic RA Percent Paced: 99 %
Date Time Interrogation Session: 20240528033319
Implantable Lead Connection Status: 753985
Implantable Lead Connection Status: 753985
Implantable Lead Connection Status: 753985
Implantable Lead Implant Date: 20160310
Implantable Lead Implant Date: 20160310
Implantable Lead Implant Date: 20160310
Implantable Lead Location: 753858
Implantable Lead Location: 753859
Implantable Lead Location: 753860
Implantable Lead Model: 1948
Implantable Pulse Generator Implant Date: 20231129
Lead Channel Impedance Value: 380 Ohm
Lead Channel Impedance Value: 530 Ohm
Lead Channel Impedance Value: 700 Ohm
Lead Channel Pacing Threshold Amplitude: 0.5 V
Lead Channel Pacing Threshold Amplitude: 0.75 V
Lead Channel Pacing Threshold Amplitude: 1 V
Lead Channel Pacing Threshold Pulse Width: 0.4 ms
Lead Channel Pacing Threshold Pulse Width: 0.4 ms
Lead Channel Pacing Threshold Pulse Width: 0.4 ms
Lead Channel Sensing Intrinsic Amplitude: 12 mV
Lead Channel Sensing Intrinsic Amplitude: 2.3 mV
Lead Channel Setting Pacing Amplitude: 2 V
Lead Channel Setting Pacing Amplitude: 2.5 V
Lead Channel Setting Pacing Amplitude: 2.5 V
Lead Channel Setting Pacing Pulse Width: 0.4 ms
Lead Channel Setting Pacing Pulse Width: 0.4 ms
Lead Channel Setting Sensing Sensitivity: 4 mV
Pulse Gen Model: 3562
Pulse Gen Serial Number: 8124783

## 2022-06-13 ENCOUNTER — Other Ambulatory Visit: Payer: Self-pay

## 2022-06-13 ENCOUNTER — Inpatient Hospital Stay (HOSPITAL_BASED_OUTPATIENT_CLINIC_OR_DEPARTMENT_OTHER)
Admission: EM | Admit: 2022-06-13 | Discharge: 2022-06-15 | DRG: 177 | Disposition: A | Discharge status: Home / Self Care | Payer: Medicare Other | Attending: Family Medicine | Admitting: Family Medicine

## 2022-06-13 ENCOUNTER — Encounter (HOSPITAL_BASED_OUTPATIENT_CLINIC_OR_DEPARTMENT_OTHER): Payer: Self-pay | Admitting: Emergency Medicine

## 2022-06-13 ENCOUNTER — Emergency Department (HOSPITAL_BASED_OUTPATIENT_CLINIC_OR_DEPARTMENT_OTHER): Payer: Medicare Other

## 2022-06-13 DIAGNOSIS — D649 Anemia, unspecified: Secondary | ICD-10-CM | POA: Diagnosis present

## 2022-06-13 DIAGNOSIS — I110 Hypertensive heart disease with heart failure: Secondary | ICD-10-CM | POA: Diagnosis present

## 2022-06-13 DIAGNOSIS — E739 Lactose intolerance, unspecified: Secondary | ICD-10-CM | POA: Diagnosis present

## 2022-06-13 DIAGNOSIS — R0902 Hypoxemia: Secondary | ICD-10-CM | POA: Diagnosis present

## 2022-06-13 DIAGNOSIS — I2699 Other pulmonary embolism without acute cor pulmonale: Secondary | ICD-10-CM | POA: Diagnosis present

## 2022-06-13 DIAGNOSIS — I428 Other cardiomyopathies: Secondary | ICD-10-CM | POA: Diagnosis present

## 2022-06-13 DIAGNOSIS — I82431 Acute embolism and thrombosis of right popliteal vein: Secondary | ICD-10-CM | POA: Diagnosis present

## 2022-06-13 DIAGNOSIS — R059 Cough, unspecified: Secondary | ICD-10-CM | POA: Diagnosis not present

## 2022-06-13 DIAGNOSIS — I2693 Single subsegmental pulmonary embolism without acute cor pulmonale: Secondary | ICD-10-CM

## 2022-06-13 DIAGNOSIS — I480 Paroxysmal atrial fibrillation: Secondary | ICD-10-CM | POA: Diagnosis present

## 2022-06-13 DIAGNOSIS — R7989 Other specified abnormal findings of blood chemistry: Secondary | ICD-10-CM | POA: Diagnosis present

## 2022-06-13 DIAGNOSIS — Z79899 Other long term (current) drug therapy: Secondary | ICD-10-CM

## 2022-06-13 DIAGNOSIS — R55 Syncope and collapse: Secondary | ICD-10-CM | POA: Diagnosis present

## 2022-06-13 DIAGNOSIS — Z7982 Long term (current) use of aspirin: Secondary | ICD-10-CM

## 2022-06-13 DIAGNOSIS — Z950 Presence of cardiac pacemaker: Secondary | ICD-10-CM

## 2022-06-13 DIAGNOSIS — I495 Sick sinus syndrome: Secondary | ICD-10-CM | POA: Diagnosis present

## 2022-06-13 DIAGNOSIS — Z825 Family history of asthma and other chronic lower respiratory diseases: Secondary | ICD-10-CM

## 2022-06-13 DIAGNOSIS — U071 COVID-19: Secondary | ICD-10-CM | POA: Diagnosis not present

## 2022-06-13 DIAGNOSIS — Z833 Family history of diabetes mellitus: Secondary | ICD-10-CM

## 2022-06-13 DIAGNOSIS — I5022 Chronic systolic (congestive) heart failure: Secondary | ICD-10-CM | POA: Diagnosis present

## 2022-06-13 DIAGNOSIS — I2510 Atherosclerotic heart disease of native coronary artery without angina pectoris: Secondary | ICD-10-CM | POA: Diagnosis present

## 2022-06-13 DIAGNOSIS — J9600 Acute respiratory failure, unspecified whether with hypoxia or hypercapnia: Secondary | ICD-10-CM | POA: Diagnosis present

## 2022-06-13 DIAGNOSIS — N400 Enlarged prostate without lower urinary tract symptoms: Secondary | ICD-10-CM | POA: Diagnosis present

## 2022-06-13 DIAGNOSIS — R001 Bradycardia, unspecified: Secondary | ICD-10-CM | POA: Diagnosis present

## 2022-06-13 DIAGNOSIS — D696 Thrombocytopenia, unspecified: Secondary | ICD-10-CM | POA: Diagnosis present

## 2022-06-13 DIAGNOSIS — I959 Hypotension, unspecified: Secondary | ICD-10-CM | POA: Diagnosis present

## 2022-06-13 DIAGNOSIS — Z8249 Family history of ischemic heart disease and other diseases of the circulatory system: Secondary | ICD-10-CM

## 2022-06-13 DIAGNOSIS — E785 Hyperlipidemia, unspecified: Secondary | ICD-10-CM | POA: Diagnosis present

## 2022-06-13 LAB — CBC WITH DIFFERENTIAL/PLATELET
Abs Immature Granulocytes: 0.01 10*3/uL (ref 0.00–0.07)
Basophils Absolute: 0 10*3/uL (ref 0.0–0.1)
Basophils Relative: 0 %
Eosinophils Absolute: 0 10*3/uL (ref 0.0–0.5)
Eosinophils Relative: 0 %
HCT: 37.1 % — ABNORMAL LOW (ref 39.0–52.0)
Hemoglobin: 12.4 g/dL — ABNORMAL LOW (ref 13.0–17.0)
Immature Granulocytes: 0 %
Lymphocytes Relative: 36 %
Lymphs Abs: 1.2 10*3/uL (ref 0.7–4.0)
MCH: 30 pg (ref 26.0–34.0)
MCHC: 33.4 g/dL (ref 30.0–36.0)
MCV: 89.6 fL (ref 80.0–100.0)
Monocytes Absolute: 0.3 10*3/uL (ref 0.1–1.0)
Monocytes Relative: 9 %
Neutro Abs: 1.9 10*3/uL (ref 1.7–7.7)
Neutrophils Relative %: 55 %
Platelets: 126 10*3/uL — ABNORMAL LOW (ref 150–400)
RBC: 4.14 MIL/uL — ABNORMAL LOW (ref 4.22–5.81)
RDW: 13.3 % (ref 11.5–15.5)
WBC: 3.4 10*3/uL — ABNORMAL LOW (ref 4.0–10.5)
nRBC: 0 % (ref 0.0–0.2)

## 2022-06-13 LAB — RESP PANEL BY RT-PCR (RSV, FLU A&B, COVID)  RVPGX2
Influenza A by PCR: NEGATIVE
Influenza B by PCR: NEGATIVE
Resp Syncytial Virus by PCR: NEGATIVE
SARS Coronavirus 2 by RT PCR: POSITIVE — AB

## 2022-06-13 LAB — LACTIC ACID, PLASMA: Lactic Acid, Venous: 0.9 mmol/L (ref 0.5–1.9)

## 2022-06-13 MED ORDER — GLUCAGON HCL RDNA (DIAGNOSTIC) 1 MG IJ SOLR
INTRAMUSCULAR | Status: AC
  Administered 2022-06-13: 2 mg via INTRAVENOUS
  Filled 2022-06-13: qty 2

## 2022-06-13 MED ORDER — GLUCAGON HCL RDNA (DIAGNOSTIC) 1 MG IJ SOLR: 2.0000 mg | Freq: Once | INTRAMUSCULAR | Status: AC

## 2022-06-13 NOTE — ED Triage Notes (Addendum)
Pt states that he has been coughing x 3 days, cannot sleep d/t cough; reports a fever and sore throat earlier, did not take any OTC medications, denies n/v/d, cp, shoB or other sxs

## 2022-06-14 ENCOUNTER — Inpatient Hospital Stay (HOSPITAL_COMMUNITY): Payer: Medicare Other

## 2022-06-14 ENCOUNTER — Other Ambulatory Visit (HOSPITAL_COMMUNITY): Payer: Self-pay

## 2022-06-14 ENCOUNTER — Emergency Department (HOSPITAL_BASED_OUTPATIENT_CLINIC_OR_DEPARTMENT_OTHER): Payer: Medicare Other

## 2022-06-14 DIAGNOSIS — M7989 Other specified soft tissue disorders: Secondary | ICD-10-CM

## 2022-06-14 DIAGNOSIS — E785 Hyperlipidemia, unspecified: Secondary | ICD-10-CM | POA: Diagnosis present

## 2022-06-14 DIAGNOSIS — D649 Anemia, unspecified: Secondary | ICD-10-CM | POA: Diagnosis present

## 2022-06-14 DIAGNOSIS — I110 Hypertensive heart disease with heart failure: Secondary | ICD-10-CM | POA: Diagnosis present

## 2022-06-14 DIAGNOSIS — R001 Bradycardia, unspecified: Secondary | ICD-10-CM | POA: Diagnosis present

## 2022-06-14 DIAGNOSIS — I2510 Atherosclerotic heart disease of native coronary artery without angina pectoris: Secondary | ICD-10-CM | POA: Diagnosis present

## 2022-06-14 DIAGNOSIS — R7989 Other specified abnormal findings of blood chemistry: Secondary | ICD-10-CM

## 2022-06-14 DIAGNOSIS — I428 Other cardiomyopathies: Secondary | ICD-10-CM | POA: Diagnosis present

## 2022-06-14 DIAGNOSIS — Z833 Family history of diabetes mellitus: Secondary | ICD-10-CM | POA: Diagnosis not present

## 2022-06-14 DIAGNOSIS — N400 Enlarged prostate without lower urinary tract symptoms: Secondary | ICD-10-CM | POA: Diagnosis present

## 2022-06-14 DIAGNOSIS — U071 COVID-19: Secondary | ICD-10-CM | POA: Diagnosis present

## 2022-06-14 DIAGNOSIS — Z79899 Other long term (current) drug therapy: Secondary | ICD-10-CM | POA: Diagnosis not present

## 2022-06-14 DIAGNOSIS — I495 Sick sinus syndrome: Secondary | ICD-10-CM | POA: Diagnosis present

## 2022-06-14 DIAGNOSIS — D696 Thrombocytopenia, unspecified: Secondary | ICD-10-CM | POA: Diagnosis present

## 2022-06-14 DIAGNOSIS — E739 Lactose intolerance, unspecified: Secondary | ICD-10-CM | POA: Diagnosis present

## 2022-06-14 DIAGNOSIS — I5022 Chronic systolic (congestive) heart failure: Secondary | ICD-10-CM | POA: Diagnosis present

## 2022-06-14 DIAGNOSIS — I82431 Acute embolism and thrombosis of right popliteal vein: Secondary | ICD-10-CM | POA: Diagnosis present

## 2022-06-14 DIAGNOSIS — Z8249 Family history of ischemic heart disease and other diseases of the circulatory system: Secondary | ICD-10-CM | POA: Diagnosis not present

## 2022-06-14 DIAGNOSIS — Z7982 Long term (current) use of aspirin: Secondary | ICD-10-CM | POA: Diagnosis not present

## 2022-06-14 DIAGNOSIS — I959 Hypotension, unspecified: Secondary | ICD-10-CM | POA: Diagnosis present

## 2022-06-14 DIAGNOSIS — R55 Syncope and collapse: Secondary | ICD-10-CM | POA: Diagnosis present

## 2022-06-14 DIAGNOSIS — J9600 Acute respiratory failure, unspecified whether with hypoxia or hypercapnia: Secondary | ICD-10-CM | POA: Diagnosis not present

## 2022-06-14 DIAGNOSIS — Z950 Presence of cardiac pacemaker: Secondary | ICD-10-CM | POA: Diagnosis not present

## 2022-06-14 DIAGNOSIS — R0902 Hypoxemia: Secondary | ICD-10-CM | POA: Diagnosis present

## 2022-06-14 DIAGNOSIS — R059 Cough, unspecified: Secondary | ICD-10-CM | POA: Diagnosis present

## 2022-06-14 DIAGNOSIS — I2693 Single subsegmental pulmonary embolism without acute cor pulmonale: Secondary | ICD-10-CM | POA: Diagnosis not present

## 2022-06-14 DIAGNOSIS — I2699 Other pulmonary embolism without acute cor pulmonale: Secondary | ICD-10-CM | POA: Diagnosis present

## 2022-06-14 DIAGNOSIS — I480 Paroxysmal atrial fibrillation: Secondary | ICD-10-CM | POA: Diagnosis present

## 2022-06-14 LAB — COMPREHENSIVE METABOLIC PANEL
ALT: 33 U/L (ref 0–44)
AST: 55 U/L — ABNORMAL HIGH (ref 15–41)
Albumin: 3.3 g/dL — ABNORMAL LOW (ref 3.5–5.0)
Alkaline Phosphatase: 43 U/L (ref 38–126)
Anion gap: 8 (ref 5–15)
BUN: 18 mg/dL (ref 8–23)
CO2: 22 mmol/L (ref 22–32)
Calcium: 8.5 mg/dL — ABNORMAL LOW (ref 8.9–10.3)
Chloride: 103 mmol/L (ref 98–111)
Creatinine, Ser: 1.08 mg/dL (ref 0.61–1.24)
GFR, Estimated: >60 mL/min (ref >60)
Glucose, Bld: 112 mg/dL — ABNORMAL HIGH (ref 70–99)
Potassium: 3.5 mmol/L (ref 3.5–5.1)
Sodium: 133 mmol/L — ABNORMAL LOW (ref 135–145)
Total Bilirubin: 1.4 mg/dL — ABNORMAL HIGH (ref 0.3–1.2)
Total Protein: 6.8 g/dL (ref 6.5–8.1)

## 2022-06-14 LAB — BASIC METABOLIC PANEL
Anion gap: 10 (ref 5–15)
BUN: 17 mg/dL (ref 8–23)
CO2: 21 mmol/L — ABNORMAL LOW (ref 22–32)
Calcium: 8.5 mg/dL — ABNORMAL LOW (ref 8.9–10.3)
Chloride: 103 mmol/L (ref 98–111)
Creatinine, Ser: 0.98 mg/dL (ref 0.61–1.24)
GFR, Estimated: >60 mL/min (ref >60)
Glucose, Bld: 122 mg/dL — ABNORMAL HIGH (ref 70–99)
Potassium: 3.4 mmol/L — ABNORMAL LOW (ref 3.5–5.1)
Sodium: 134 mmol/L — ABNORMAL LOW (ref 135–145)

## 2022-06-14 LAB — BRAIN NATRIURETIC PEPTIDE: B Natriuretic Peptide: 353.7 pg/mL — ABNORMAL HIGH (ref 0.0–100.0)

## 2022-06-14 LAB — HEPARIN LEVEL (UNFRACTIONATED)
Heparin Unfractionated: 0.46 IU/mL (ref 0.30–0.70)
Heparin Unfractionated: 0.56 IU/mL (ref 0.30–0.70)

## 2022-06-14 LAB — TROPONIN I (HIGH SENSITIVITY)
Troponin I (High Sensitivity): 53 ng/L — ABNORMAL HIGH (ref <18)
Troponin I (High Sensitivity): 50 ng/L — ABNORMAL HIGH (ref <18)

## 2022-06-14 LAB — CBC
HCT: 36.9 % — ABNORMAL LOW (ref 39.0–52.0)
Hemoglobin: 12 g/dL — ABNORMAL LOW (ref 13.0–17.0)
MCH: 29.8 pg (ref 26.0–34.0)
MCHC: 32.5 g/dL (ref 30.0–36.0)
MCV: 91.6 fL (ref 80.0–100.0)
Platelets: 125 10*3/uL — ABNORMAL LOW (ref 150–400)
RBC: 4.03 MIL/uL — ABNORMAL LOW (ref 4.22–5.81)
RDW: 13.4 % (ref 11.5–15.5)
WBC: 3.7 10*3/uL — ABNORMAL LOW (ref 4.0–10.5)
nRBC: 0 % (ref 0.0–0.2)

## 2022-06-14 LAB — MAGNESIUM: Magnesium: 2.2 mg/dL (ref 1.7–2.4)

## 2022-06-14 LAB — D-DIMER, QUANTITATIVE: D-Dimer, Quant: 1.04 ug/mL-FEU — ABNORMAL HIGH (ref 0.00–0.50)

## 2022-06-14 LAB — PHOSPHORUS: Phosphorus: 3.6 mg/dL (ref 2.5–4.6)

## 2022-06-14 MED ORDER — ADULT MULTIVITAMIN W/MINERALS CH
1.0000 | ORAL_TABLET | Freq: Every day | ORAL | Status: DC
  Administered 2022-06-14 – 2022-06-15 (×2): 1 via ORAL
  Filled 2022-06-14 (×2): qty 1

## 2022-06-14 MED ORDER — DEXAMETHASONE 4 MG PO TABS: 6.0000 mg | ORAL_TABLET | Freq: Every day | ORAL | Status: DC

## 2022-06-14 MED ORDER — IPRATROPIUM-ALBUTEROL 20-100 MCG/ACT IN AERS
1.0000 | INHALATION_SPRAY | Freq: Four times a day (QID) | RESPIRATORY_TRACT | Status: DC
  Administered 2022-06-14 (×3): 1 via RESPIRATORY_TRACT
  Filled 2022-06-14: qty 4

## 2022-06-14 MED ORDER — LIDOCAINE HCL URETHRAL/MUCOSAL 2 % EX GEL: 1.0000 | Freq: Once | CUTANEOUS | Status: DC

## 2022-06-14 MED ORDER — NIRMATRELVIR/RITONAVIR (PAXLOVID)TABLET
3.0000 | ORAL_TABLET | Freq: Two times a day (BID) | ORAL | Status: DC
  Administered 2022-06-14 – 2022-06-15 (×2): 3 via ORAL
  Filled 2022-06-14: qty 30

## 2022-06-14 MED ORDER — FUROSEMIDE 20 MG PO TABS
20.0000 mg | ORAL_TABLET | Freq: Every day | ORAL | Status: DC
  Administered 2022-06-15: 20 mg via ORAL
  Filled 2022-06-14: qty 1

## 2022-06-14 MED ORDER — TAMSULOSIN HCL 0.4 MG PO CAPS
0.4000 mg | ORAL_CAPSULE | Freq: Every day | ORAL | Status: DC
  Administered 2022-06-14 – 2022-06-15 (×2): 0.4 mg via ORAL
  Filled 2022-06-14 (×2): qty 1

## 2022-06-14 MED ORDER — IPRATROPIUM-ALBUTEROL 20-100 MCG/ACT IN AERS
1.0000 | INHALATION_SPRAY | Freq: Four times a day (QID) | RESPIRATORY_TRACT | Status: DC
  Filled 2022-06-14: qty 4

## 2022-06-14 MED ORDER — SODIUM CHLORIDE 0.9 % IV SOLN
200.0000 mg | Freq: Once | INTRAVENOUS | Status: AC
  Administered 2022-06-14: 200 mg via INTRAVENOUS
  Filled 2022-06-14 (×2): qty 40

## 2022-06-14 MED ORDER — FOLIC ACID 1 MG PO TABS
1.0000 mg | ORAL_TABLET | Freq: Every day | ORAL | Status: DC
  Administered 2022-06-14 – 2022-06-15 (×2): 1 mg via ORAL
  Filled 2022-06-14 (×2): qty 1

## 2022-06-14 MED ORDER — SODIUM CHLORIDE 0.9 % IV SOLN: 100.0000 mg | Freq: Every day | INTRAVENOUS | Status: DC

## 2022-06-14 MED ORDER — IPRATROPIUM-ALBUTEROL 20-100 MCG/ACT IN AERS
1.0000 | INHALATION_SPRAY | Freq: Three times a day (TID) | RESPIRATORY_TRACT | Status: DC
  Administered 2022-06-15: 1 via RESPIRATORY_TRACT
  Filled 2022-06-14: qty 4

## 2022-06-14 MED ORDER — GUAIFENESIN 100 MG/5ML PO LIQD
5.0000 mL | ORAL | Status: DC | PRN
  Administered 2022-06-14 – 2022-06-15 (×2): 5 mL via ORAL
  Filled 2022-06-14 (×3): qty 10

## 2022-06-14 MED ORDER — HEPARIN (PORCINE) 25000 UT/250ML-% IV SOLN
1200.0000 [IU]/h | INTRAVENOUS | Status: DC
  Administered 2022-06-14 (×2): 1200 [IU]/h via INTRAVENOUS
  Filled 2022-06-14 (×2): qty 250

## 2022-06-14 MED ORDER — POTASSIUM CHLORIDE CRYS ER 20 MEQ PO TBCR
40.0000 meq | EXTENDED_RELEASE_TABLET | Freq: Once | ORAL | Status: AC
  Administered 2022-06-14: 40 meq via ORAL
  Filled 2022-06-14: qty 2

## 2022-06-14 MED ORDER — HEPARIN BOLUS VIA INFUSION
4000.0000 [IU] | Freq: Once | INTRAVENOUS | Status: AC
  Administered 2022-06-14: 4000 [IU] via INTRAVENOUS

## 2022-06-14 MED ORDER — ROSUVASTATIN CALCIUM 10 MG PO TABS: 10.0000 mg | ORAL_TABLET | Freq: Every day | ORAL | Status: DC

## 2022-06-14 MED ORDER — ALBUTEROL SULFATE HFA 108 (90 BASE) MCG/ACT IN AERS: 2.0000 | INHALATION_SPRAY | RESPIRATORY_TRACT | Status: DC | PRN

## 2022-06-14 MED ORDER — ACETAMINOPHEN 325 MG PO TABS
650.0000 mg | ORAL_TABLET | Freq: Four times a day (QID) | ORAL | Status: DC | PRN
  Administered 2022-06-14: 650 mg via ORAL
  Filled 2022-06-14: qty 2

## 2022-06-14 MED ORDER — MONTELUKAST SODIUM 10 MG PO TABS
10.0000 mg | ORAL_TABLET | Freq: Every day | ORAL | Status: DC
  Administered 2022-06-14: 10 mg via ORAL
  Filled 2022-06-14: qty 1

## 2022-06-14 MED ORDER — THIAMINE MONONITRATE 100 MG PO TABS
100.0000 mg | ORAL_TABLET | Freq: Every day | ORAL | Status: DC
  Administered 2022-06-14 – 2022-06-15 (×2): 100 mg via ORAL
  Filled 2022-06-14 (×2): qty 1

## 2022-06-14 MED ORDER — CARVEDILOL 3.125 MG PO TABS
3.1250 mg | ORAL_TABLET | Freq: Two times a day (BID) | ORAL | Status: DC
  Administered 2022-06-14 – 2022-06-15 (×2): 3.125 mg via ORAL
  Filled 2022-06-14 (×2): qty 1

## 2022-06-14 MED ORDER — PROCHLORPERAZINE EDISYLATE 10 MG/2ML IJ SOLN: 5.0000 mg | Freq: Four times a day (QID) | INTRAMUSCULAR | Status: DC | PRN

## 2022-06-14 MED ORDER — IOHEXOL 350 MG/ML SOLN
75.0000 mL | Freq: Once | INTRAVENOUS | Status: AC | PRN
  Administered 2022-06-14: 75 mL via INTRAVENOUS

## 2022-06-14 MED ORDER — ROSUVASTATIN CALCIUM 10 MG PO TABS
10.0000 mg | ORAL_TABLET | Freq: Every day | ORAL | Status: DC
  Administered 2022-06-14: 10 mg via ORAL
  Filled 2022-06-14: qty 1

## 2022-06-14 MED ORDER — POLYETHYLENE GLYCOL 3350 17 G PO PACK: 17.0000 g | PACK | Freq: Every day | ORAL | Status: DC | PRN

## 2022-06-14 MED ORDER — MELATONIN 5 MG PO TABS: 5.0000 mg | ORAL_TABLET | Freq: Every evening | ORAL | Status: DC | PRN

## 2022-06-14 MED ORDER — DEXAMETHASONE 4 MG PO TABS
6.0000 mg | ORAL_TABLET | Freq: Every day | ORAL | Status: DC
  Administered 2022-06-14: 6 mg via ORAL
  Filled 2022-06-14: qty 1
  Filled 2022-06-14: qty 2

## 2022-06-14 NOTE — H&P (Signed)
History and Physical  Miklo Threats [id number] DOB: 09/30/1950 DOA: 06/13/2022  Referring physician: Accepted by Dr. Joneen Roach [facility], Hospitalist service  PCP: Verdell Face, DO  Outpatient Specialists: Cardiology Patient coming from: Home.  Chief Complaint: Cough  HPI: Phillip Booth is a 72 y.o. male with medical history significant for chronic HFrEF 45-50%, paroxysmal A-fib not on oral anticoagulation, coronary artery disease, nonischemic cardiomyopathy, symptomatic bradycardia due to sinus node dysfunction/AV block status post pacemaker placement, who initially presented to [facility] ED with complaints of persistent cough, fever, and sore throat for the past 3 days.  Associated with pleuritic chest pain and generalized fatigue.  He presented to the ED for further evaluation.  In the ED, temperature elevated 99.3.  COVID-19 screening test positive.  Troponin elevated and flat 53, repeat 50.  CT angio chest revealed the following findings: 1. Limited evaluation due to respiratory motion artifact. There is a questionable segmental pulmonary artery filling defect in the right lower lobe. No evidence of right heart strain. Consider short-term follow-up repeat evaluation or V/Q scan. 2. Scattered ground-glass opacities in the lungs bilaterally, possible edema or infiltrate. 3. Trace bilateral pleural effusions. 4. Multi-vessel coronary artery calcifications. 5. Aortic atherosclerosis.  The patient was started on heparin drip.  Admitted by Dr. Joneen Roach, [facility], hospitalist service, and transferred to [facility] telemetry unit as inpatient status.  ED Course: Temperature 98.8.  BP 112/65, pulse 62, respiratory 17, saturation 98% on room air.  Lab studies remarkable for WBC 3.4, hemoglobin 12.4, platelet count 126.  Serum sodium 133, glucose 112, calcium 8.5, albumin 3.3, AST 55, T. bili 1.4.  Review of Systems: Review of systems as noted in the HPI. All other systems reviewed and are  negative.   Past Medical History:  Diagnosis Date   Allergy    CAD (coronary artery disease)    a. moderate by cath 03/2014   Cataract    Cervical strain 03/25/2020   CVA (cerebral infarction)    a. diagnosis not clear   Hyperlipidemia    Hypertension    Non-ischemic cardiomyopathy ([facility])    a. EF 45% by echo 03/2014   Paroxysmal atrial fibrillation ([facility]) 05/01/2014   asymptomatic, documented on PPM interrogation,  chads2vasc score is at least 6.  He is contemplating anticoagulation   Symptomatic bradycardia    a. s/p STJ CRTP implanted by Dr Johney Frame   Vertigo    Past Surgical History:  Procedure Laterality Date   ABDOMINAL SURGERY     APPENDECTOMY     BI-VENTRICULAR PACEMAKER INSERTION N/A 03/19/2014   STJ CRTP implanted by Dr Johney Frame   EYE SURGERY     Cataract removed from Right eye   HERNIA REPAIR     LEFT HEART CATHETERIZATION WITH CORONARY ANGIOGRAM N/A 03/19/2014   Procedure: LEFT HEART CATHETERIZATION WITH CORONARY ANGIOGRAM;  Surgeon: Lennette Bihari, MD;  Location: [facility] CATH LAB;  Service: Cardiovascular;  Laterality: N/A;   PPM GENERATOR CHANGEOUT N/A 12/07/2021   Procedure: PPM GENERATOR CHANGEOUT;  Surgeon: Nelly Laurence, Roberts Gaudy, MD;  Location: [facility] INVASIVE CV LAB;  Service: Cardiovascular;  Laterality: N/A;    Social History:  reports that he has never smoked. He has never used smokeless tobacco. He reports that he does not currently use alcohol. He reports that he does not use drugs.   Allergies  Allergen Reactions   Lactose Intolerance (Gi) Hives and Nausea And Vomiting    Family History  Problem Relation Age of Onset   Hypertension Mother  Cancer Mother        brain (possibly started in lung)   Heart disease Mother    Diabetes Mother    Other Mother        cardiomegaly   Hypertension Father    Emphysema Father    Heart Problems Brother        pacemaker   Heart Problems Other        "using 10% of heart"      Prior to Admission medications   Medication  Sig Start Date End Date Taking? Authorizing Provider  amLODipine (NORVASC) 10 MG tablet Take 1 tablet (10 mg total) by mouth daily. 03/17/19 12/07/21  Netta Neat., NP  aspirin 81 MG tablet Take 81 mg by mouth daily.     [provider]  carvedilol (COREG) 6.25 MG tablet TAKE 1 TABLET BY MOUTH TWICE DAILY WITH A MEAL 01/01/19   Saguier, Ramon Dredge, PA-C  dextromethorphan-guaiFENesin [facility] DM) 30-600 MG 12hr tablet Take 1 tablet by mouth 2 (two) times daily as needed for cough. 12/20/21   Molpus, John, MD  fluticasone (FLOVENT HFA) 110 MCG/ACT inhaler Inhale into the lungs. 07/03/19   [provider]  furosemide (LASIX) 20 MG tablet Take by mouth. 05/22/18   [provider]  isosorbide mononitrate (IMDUR) 30 MG 24 hr tablet TAKE 1 TABLET (30 MG TOTAL) BY MOUTH DAILY. 02/26/19   Graciella Freer, PA-C  levocetirizine (XYZAL) 5 MG tablet Take by mouth. 08/20/18   [provider]  lisinopril (ZESTRIL) 40 MG tablet TAKE 1 TABLET (40 MG TOTAL) BY MOUTH DAILY. 11/28/18   Saguier, Ramon Dredge, PA-C  montelukast (SINGULAIR) 10 MG tablet Take 1 tablet (10 mg total) by mouth at bedtime. 08/20/18   Saguier, Ramon Dredge, PA-C  rosuvastatin (CRESTOR) 10 MG tablet Take 1 tablet (10 mg total) by mouth daily. 04/18/18   Saguier, Ramon Dredge, PA-C  sodium chloride (OCEAN) 0.65 % SOLN nasal spray Place 1 spray into both nostrils as needed for congestion.    [provider]  tamsulosin (FLOMAX) 0.4 MG CAPS capsule Take 0.4 mg by mouth daily. 11/13/21   [provider]    Physical Exam: BP 112/65 (BP Location: Right Arm)   Pulse 62   Temp 98.8 F (37.1 C) (Oral)   Resp 17   Ht 6\' 4"  (1.93 m)   Wt 99.8 kg   SpO2 98%   BMI 26.78 kg/m   General: 72 y.o. year-old male well developed well nourished in no acute distress.  Alert and oriented x3. Cardiovascular: Regular rate and rhythm with no rubs or gallops.  No thyromegaly or JVD noted.  Trace lower extremity edema. 2/4  pulses in all 4 extremities. Respiratory: Mild rales at bases.  Poor inspiratory effort. Abdomen: Soft nontender nondistended with normal bowel sounds x4 quadrants. Muskuloskeletal: No cyanosis or clubbing noted bilaterally Neuro: CN II-XII intact, strength, sensation, reflexes Skin: No ulcerative lesions noted or rashes Psychiatry: Judgement and insight appear normal. Mood is appropriate for condition and setting          Labs on Admission:  Basic Metabolic Panel: Recent Labs  Lab 06/13/22 2321  NA 133*  K 3.5  CL 103  CO2 22  GLUCOSE 112*  BUN 18  CREATININE 1.08  CALCIUM 8.5*   Liver Function Tests: Recent Labs  Lab 06/13/22 2321  AST 55*  ALT 33  ALKPHOS 43  BILITOT 1.4*  PROT 6.8  ALBUMIN 3.3*   No results for input(s): "LIPASE", "AMYLASE" in  the last 168 hours. No results for input(s): "AMMONIA" in the last 168 hours. CBC: Recent Labs  Lab 06/13/22 2321  WBC 3.4*  NEUTROABS 1.9  HGB 12.4*  HCT 37.1*  MCV 89.6  PLT 126*   Cardiac Enzymes: No results for input(s): "CKTOTAL", "CKMB", "CKMBINDEX", "TROPONINI" in the last 168 hours.  BNP (last 3 results) Recent Labs    06/13/22 2321  BNP 353.7*    ProBNP (last 3 results) No results for input(s): "PROBNP" in the last 8760 hours.  CBG: No results for input(s): "GLUCAP" in the last 168 hours.  Radiological Exams on Admission: CT Angio Chest PE W and/or Wo Contrast  Result Date: 06/14/2022 CLINICAL DATA:  Syncope/presyncope early.  Cough with fever. EXAM: CT ANGIOGRAPHY CHEST WITH CONTRAST TECHNIQUE: Multidetector CT imaging of the chest was performed using the standard protocol during bolus administration of intravenous contrast. Multiplanar CT image reconstructions and MIPs were obtained to evaluate the vascular anatomy. RADIATION DOSE REDUCTION: This exam was performed according to the departmental dose-optimization program which includes automated exposure control, adjustment of the mA and/or kV  according to patient size and/or use of iterative reconstruction technique. CONTRAST:  75mL OMNIPAQUE IOHEXOL 350 MG/ML SOLN COMPARISON:  03/17/2014. FINDINGS: Cardiovascular: The heart is normal in size and there is no pericardial effusion. Multi-vessel coronary artery calcifications are noted. Pacemaker leads are noted in the heart. There is atherosclerotic calcification of the aorta without evidence of aneurysm. Pulmonary trunk is normal in caliber. There is a questionable pulmonary artery filling defect in a segmental right lower lobe pulmonary artery. Examination is limited due to respiratory motion. No evidence of right heart strain. Mediastinum/Nodes: No mediastinal or hilar lymphadenopathy. There is a nonspecific prominent lymph node at the right hilum measuring 1.3 cm. The thyroid gland, trachea, and esophagus are within normal limits. Lungs/Pleura: Few scattered ground-glass opacities are present in the lungs bilaterally. There are trace bilateral pleural effusions. No pneumothorax is seen. Upper Abdomen: No acute abnormality. Musculoskeletal: Pacemaker device is present in the anterior chest wall on the left. Degenerative changes are present in the thoracic spine. No acute osseous abnormality. Review of the MIP images confirms the above findings. IMPRESSION: 1. Limited evaluation due to respiratory motion artifact. There is a questionable segmental pulmonary artery filling defect in the right lower lobe. No evidence of right heart strain. Consider short-term follow-up repeat evaluation or V/Q scan. 2. Scattered ground-glass opacities in the lungs bilaterally, possible edema or infiltrate. 3. Trace bilateral pleural effusions. 4. Multi-vessel coronary artery calcifications. 5. Aortic atherosclerosis. Electronically Signed   By: Thornell Sartorius M.D.   On: 06/14/2022 00:47   DG Chest 2 View  Result Date: 06/13/2022 CLINICAL DATA:  Possible sepsis with cough for 3 days EXAM: CHEST - 2 VIEW COMPARISON:   08/20/2018 FINDINGS: Cardiac shadow is stable. Pacing device is again seen and stable. The lungs are well aerated bilaterally. No focal infiltrate or effusion is seen. No bony abnormality is noted. IMPRESSION: No acute abnormality noted. Electronically Signed   By: Alcide Clever M.D.   On: 06/13/2022 23:04    EKG: I independently viewed the EKG done and my findings are as followed: Paced rhythm rate of 68.  Nonspecific ST-T changes.  QTc 436.  Assessment/Plan Present on Admission:  Acute respiratory failure due to COVID-19 [facility])  Principal Problem:   Acute respiratory failure due to COVID-19 [facility])  Acute hypoxic respiratory failure secondary to COVID-19 viral infection, POA and possibly pulmonary embolism Not on oxygen supplementation at  baseline, currently on 2 L to maintain O2 saturation greater than 92% COVID-19 screening test positive on 06/13/2022. Airborne contact precautions IV remdesivir x 3 days Combivent every 6 hours P.o. Decadron 6 mg daily x 5 days As needed antitussives Incentive spirometer Maintain O2 saturation greater than 92% Early mobilization  Possible pulmonary embolism CT angio PE was equivocal Was started on heparin drip in the ED, continue for now D-dimer 1.04 Follow 2D echo  COVID-19 viral infection Management as stated above  Elevated troponin, suspect demand ischemia in the setting of acute hypoxia and COVID-19 viral infection Troponin peaked at 53 and downtrended Follow 2D echo and monitor on telemetry  Paroxysmal A-fib, rate controlled Resume home Coreg at lowest dose 3.125 mg twice daily to avoid hypotension Not on oral anticoagulation prior to presentation. Continue heparin drip for now Monitor on telemetry  Chronic HFrEF Resume home Lasix Start strict I's and O's and daily weight  Essential hypertension BPs are currently soft Home Coreg restarted, rest of home oral antihypertensives held to avoid hypotension Closely monitor vital  signs  Hyperlipidemia Resume home Crestor  BPH Resume home Flomax  Coronary artery disease Resume home aspirin and Crestor    DVT prophylaxis: Heparin drip  Code Status: Full code  Family Communication: None at bedside  Disposition Plan: Admitted to telemetry unit  Consults called: None.  Admission status: Inpatient status.   Status is: Inpatient The patient requires at least 2 midnights for further evaluation and treatment of present condition.   Darlin Drop MD [facility] Pager [phone]  If 7PM-7AM, please contact night-coverage [email or web address] Password [email or web address]  06/14/2022, 3:30 AM

## 2022-06-14 NOTE — Progress Notes (Signed)
ANTICOAGULATION CONSULT NOTE - Initial Consult  Pharmacy Consult for Heparin  Indication:  ?small PE  Allergies  Allergen Reactions   Lactose Intolerance (Gi) Hives and Nausea And Vomiting   Patient Measurements: Height: 6\' 4"  (193 cm) Weight: 99.8 kg (220 lb) IBW/kg (Calculated) : 86.8  Vital Signs: Temp: 98.8 F (37.1 C) (06/05 0320) Temp Source: Oral (06/05 0320) BP: 112/65 (06/05 0320) Pulse Rate: 62 (06/05 0320)  Labs: Recent Labs    06/13/22 2321 06/14/22 0153  HGB 12.4*  --   HCT 37.1*  --   PLT 126*  --   CREATININE 1.08  --   TROPONINIHS 53* 50*    Estimated Creatinine Clearance: 74.8 mL/min (by C-G formula based on SCr of 1.08 mg/dL).   Medical History: Past Medical History:  Diagnosis Date   Allergy    CAD (coronary artery disease)    a. moderate by cath 03/2014   Cataract    Cervical strain 03/25/2020   CVA (cerebral infarction)    a. diagnosis not clear   Hyperlipidemia    Hypertension    Non-ischemic cardiomyopathy ([facility]CC)    a. EF 45% by echo 03/2014   Paroxysmal atrial fibrillation ([facility]) 05/01/2014   asymptomatic, documented on PPM interrogation,  chads2vasc score is at least 6.  He is contemplating anticoagulation   Symptomatic bradycardia    a. s/p STJ CRTP implanted by Dr Johney Frame   Vertigo     Assessment: 72 y/o M with COVID, questionable filling defect on CT angio, starting heparin for now, labs above reviewed, mildly elevated trop  Goal of Therapy:  Heparin level 0.3-0.7 units/ml Monitor platelets by anticoagulation protocol: Yes   Plan:  Heparin 4000 units BOLUS Start heparin drip at 1200 units/hr 1000 Heparin level Daily CBC/Heparin level Monitor for bleeding  Abran Duke, PharmD, BCPS Clinical Pharmacist Phone: [phone]

## 2022-06-14 NOTE — Progress Notes (Signed)
ANTICOAGULATION CONSULT NOTE - Follow Up Consult  Pharmacy Consult for Heparin IV Indication: pulmonary embolus  Allergies  Allergen Reactions   Lactose Intolerance (Gi) Hives and Nausea And Vomiting    Patient Measurements: Height: 6\' 4"  (193 cm) Weight: 99.8 kg (220 lb) IBW/kg (Calculated) : 86.8 Heparin Dosing Weight: TBW  Vital Signs: Temp: 98.8 F (37.1 C) (06/05 0320) Temp Source: Oral (06/05 0320) BP: 112/65 (06/05 0320) Pulse Rate: 62 (06/05 0320)  Labs: Recent Labs    06/13/22 2321 06/14/22 0153 06/14/22 0420  HGB 12.4*  --  12.0*  HCT 37.1*  --  36.9*  PLT 126*  --  125*  CREATININE 1.08  --  0.98  TROPONINIHS 53* 50*  --     Estimated Creatinine Clearance: 82.4 mL/min (by C-G formula based on SCr of 0.98 mg/dL).   Medications:  Infusions:   heparin 1,200 Units/hr (06/14/22 0142)   [START ON 06/15/2022] remdesivir 100 mg in sodium chloride 0.9 % 100 mL IVPB      Assessment: 71 yoM admitted on 6/5 with COVID-19 infection and possible PE.  Noted history of afib, but NO prior to admission anticoagulation.  Pharmacy is consulted to dose Heparin.   6/5 CT:  Limited evaluation due to respiratory motion artifact. There is a questionable segmental pulmonary artery filling defect in the right lower lobe. No evidence of right heart strain. Consider short-term follow-up repeat evaluation or V/Q scan. Baseline D-dimer 1.04  Today, 06/14/2022: Heparin level 0.46, therapeutic on heparin at 1200 unit/hr CBC:  Hgb decreased to 12, Plt low/stable at 125k No bleeding or complications reported.     Goal of Therapy:  Heparin level 0.3-0.7 units/ml Monitor platelets by anticoagulation protocol: Yes   Plan:  Continue heparin IV infusion at 1200 units/hr Confirmatory heparin level in 8 hours Daily heparin level and CBC   Lynann Beaver PharmD, BCPS [facility] main pharmacy [phone] 06/14/2022 11:21 AM

## 2022-06-14 NOTE — ED Notes (Signed)
Patient transported to CT 

## 2022-06-14 NOTE — ED Notes (Signed)
[facility] called for transport @01 :33 am

## 2022-06-14 NOTE — TOC Benefit Eligibility Note (Signed)
Patient Product/process development scientist completed.    The patient is currently admitted and upon discharge could be taking Eliquis 5 mg.  The current 30 day co-pay is $448.09 due to a deductible.  Will be $47.00 once deductible is met.   The patient is currently admitted and upon discharge could be taking Xarelto 20 mg.  The current 30 day co-pay is $448.09 due to a deductible.  Will be $47.00 once deductible is met.   The patient is insured through [employer]   This test claim was processed through [facility]- copay amounts may vary at other pharmacies due to pharmacy/plan contracts, or as the patient moves through the different stages of their insurance plan.  Roland Earl, CPHT Pharmacy Patient Advocate Specialist [facility] Patient Advocate Team Direct Number: [phone]  Fax: [phone]

## 2022-06-14 NOTE — Progress Notes (Signed)
ANTICOAGULATION CONSULT NOTE - Follow Up Consult  Pharmacy Consult for Heparin IV Indication: pulmonary embolus  Allergies  Allergen Reactions   Lactose Intolerance (Gi) Hives and Nausea And Vomiting    Patient Measurements: Height: 6\' 4"  (193 cm) Weight: 99.8 kg (220 lb) IBW/kg (Calculated) : 86.8 Heparin Dosing Weight: TBW  Vital Signs: Temp: 97.8 F (36.6 C) (06/05 1232) Temp Source: Oral (06/05 1232) BP: 125/78 (06/05 1232) Pulse Rate: 59 (06/05 1232)  Labs: Recent Labs    06/13/22 2321 06/14/22 0153 06/14/22 0420 06/14/22 1029 06/14/22 1901  HGB 12.4*  --  12.0*  --   --   HCT 37.1*  --  36.9*  --   --   PLT 126*  --  125*  --   --   HEPARINUNFRC  --   --   --  0.46 0.56  CREATININE 1.08  --  0.98  --   --   TROPONINIHS 53* 50*  --   --   --      Estimated Creatinine Clearance: 82.4 mL/min (by C-G formula based on SCr of 0.98 mg/dL).   Medications:  Infusions:   heparin 1,200 Units/hr (06/14/22 1820)    Assessment: 18 yoM admitted on 6/5 with COVID-19 infection and possible PE.  Noted history of afib, but NO prior to admission anticoagulation.  Pharmacy is consulted to dose Heparin.   6/5 CT:  Limited evaluation due to respiratory motion artifact. There is a questionable segmental pulmonary artery filling defect in the right lower lobe. No evidence of right heart strain. Consider short-term follow-up repeat evaluation or V/Q scan. Baseline D-dimer 1.04  Today, 06/14/2022: 19:01 confirmatory Heparin level 0.56, therapeutic on heparin at 1200 unit/hr CBC:  Hgb decreased to 12, Plt low/stable at 125k No bleeding or complications reported.     Goal of Therapy:  Heparin level 0.3-0.7 units/ml Monitor platelets by anticoagulation protocol: Yes   Plan:  Continue heparin IV infusion at 1200 units/hr Monitor daily heparin level, CBC, signs/symptoms of bleeding   Thank you for allowing pharmacy to be a part of this patient's care.  Selinda Eon,  PharmD, BCPS Clinical Pharmacist [place] Please utilize Amion for appropriate phone number to reach the unit pharmacist [facility]) 06/14/2022 7:57 PM

## 2022-06-14 NOTE — Progress Notes (Signed)
This is a 72 year old male with past medical history of CAD, EF 45-50%, PAF, h/o CVA, HLD, HTN, and PPM placed.  Patient presents with complaint of 3 days of coughing, insomnia due to cough, fever and sore throat.  He denies nausea, vomiting, diarrhea.  In the ER patient's Tmax 99.3, initial BP 85/61 currently 100/62, patient borderline bradycardic HR 50-60, RR 20-26 currently 22. Patient COVID-positive, satting 97 to 98% per report occasionally drop to the high 80s while sleeping.  In the ER patient hypotensive, patient's Coreg reversed with glucagon.  EDP reluctant to give IV fluids given patient's low EF.  BP response of increased 105/66. WBC 3.4, platelets 126, sodium 133, BNP 353.7, troponin 53.  Baseline BNP 164 done 08/2018.  EKG paced rhythm with LVH.  CTA chest 1. Limited evaluation due to respiratory motion artifact. There is a questionable segmental pulmonary artery filling defect in the right lower lobe. No evidence of right heart strain. Consider short-term follow-up repeat evaluation or V/Q scan. 2. Scattered ground-glass opacities in the lungs bilaterally, possible edema or infiltrate. 3. Trace bilateral pleural effusions. 4. Multi-vessel coronary artery calcifications.  Patient started on heparin drip, remdesivir, Decadron.  Admission requested.  Admit to [facility] or [facility].  First bed available

## 2022-06-14 NOTE — ED Provider Notes (Signed)
[place] [facility] Provider Note   CSN: [id number] Arrival date & time: 06/13/22  2222     History  Chief Complaint  Patient presents with   Cough    Sincere Gaye is a 72 y.o. male.  The history is provided by the patient.  Cough Cough characteristics:  Non-productive Severity:  Moderate Onset quality:  Gradual Duration:  3 hours Timing:  Intermittent Progression:  Unchanged Chronicity:  New Relieved by:  Nothing Worsened by:  Nothing Ineffective treatments:  None tried Associated symptoms: chest pain, fever and sore throat   Risk factors: no recent travel   Patient with CAD and CHF only on ASA presents with cough and pain that is keeping him from sleeping and sore throat and subjective fever.      Past Medical History:  Diagnosis Date   Allergy    CAD (coronary artery disease)    a. moderate by cath 03/2014   Cataract    Cervical strain 03/25/2020   CVA (cerebral infarction)    a. diagnosis not clear   Hyperlipidemia    Hypertension    Non-ischemic cardiomyopathy (HCC)    a. EF 45% by echo 03/2014   Paroxysmal atrial fibrillation (HCC) 05/01/2014   asymptomatic, documented on PPM interrogation,  chads2vasc score is at least 6.  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 Authorizing Provider  amLODipine (NORVASC) 10 MG tablet Take 1 tablet (10 mg total) by mouth daily. 03/17/19 12/07/21  Netta Neat., NP  aspirin 81 MG tablet Take 81 mg by mouth daily.     [provider]  carvedilol (COREG) 6.25 MG tablet TAKE 1 TABLET BY MOUTH TWICE DAILY WITH A MEAL 01/01/19   Saguier, Ramon Dredge, PA-C  dextromethorphan-guaiFENesin [facility] DM) 30-600 MG 12hr tablet Take 1 tablet by mouth 2 (two) times daily as needed for cough. 12/20/21   Molpus, John, MD   fluticasone (FLOVENT HFA) 110 MCG/ACT inhaler Inhale into the lungs. 07/03/19   [provider]  furosemide (LASIX) 20 MG tablet Take by mouth. 05/22/18   [provider]  isosorbide mononitrate (IMDUR) 30 MG 24 hr tablet TAKE 1 TABLET (30 MG TOTAL) BY MOUTH DAILY. 02/26/19   Graciella Freer, PA-C  levocetirizine (XYZAL) 5 MG tablet Take by mouth. 08/20/18   [provider]  lisinopril (ZESTRIL) 40 MG tablet TAKE 1 TABLET (40 MG TOTAL) BY MOUTH DAILY. 11/28/18   Saguier, Ramon Dredge, PA-C  montelukast (SINGULAIR) 10 MG tablet Take 1 tablet (10 mg total) by mouth at bedtime. 08/20/18   Saguier, Ramon Dredge, PA-C  rosuvastatin (CRESTOR) 10 MG tablet Take 1 tablet (10 mg total) by mouth daily. 04/18/18   Saguier, Ramon Dredge, PA-C  sodium chloride (OCEAN) 0.65 % SOLN nasal spray Place 1 spray into both nostrils as needed for congestion.    [provider]  tamsulosin (FLOMAX) 0.4 MG CAPS capsule Take 0.4 mg by mouth daily. 11/13/21   [provider]      Allergies    Lactose intolerance (gi)    Review of Systems   Review of Systems  Constitutional:  Positive for fever.  HENT:  Positive for sore throat. Negative for trouble swallowing and voice change.   Respiratory:  Positive for cough.   Cardiovascular:  Positive for  chest pain.  All other systems reviewed and are negative.   Physical Exam Updated Vital Signs BP 100/62   Pulse (!) 59   Temp 99.3 F (37.4 C) (Oral)   Resp (!) 21   Ht 6\' 4"  (1.93 m)   Wt 99.8 kg   SpO2 98%   BMI 26.78 kg/m  Physical Exam Vitals and nursing note reviewed.  Constitutional:      General: He is not in acute distress.    Appearance: Normal appearance. He is well-developed. He is not diaphoretic.  HENT:     Head: Normocephalic and atraumatic.     Nose: Nose normal.  Eyes:     Conjunctiva/sclera: Conjunctivae normal.     Pupils: Pupils are equal, round, and reactive to light.  Cardiovascular:     Rate and Rhythm:  Normal rate and regular rhythm.     Pulses: Normal pulses.     Heart sounds: Normal heart sounds.  Pulmonary:     Effort: Pulmonary effort is normal.     Breath sounds: Normal breath sounds. No wheezing or rales.  Abdominal:     General: Bowel sounds are normal.     Palpations: Abdomen is soft.     Tenderness: There is no abdominal tenderness. There is no guarding or rebound.  Musculoskeletal:        General: Normal range of motion.     Cervical back: Normal range of motion and neck supple.  Skin:    General: Skin is warm and dry.     Capillary Refill: Capillary refill takes less than 2 seconds.  Neurological:     General: No focal deficit present.     Mental Status: He is alert and oriented to person, place, and time.     Deep Tendon Reflexes: Reflexes normal.  Psychiatric:        Mood and Affect: Mood normal.        Behavior: Behavior normal.     ED Results / Procedures / Treatments   Labs (all labs ordered are listed, but only abnormal results are displayed) Results for orders placed or performed during the hospital encounter of 06/13/22  Resp panel by RT-PCR (RSV, Flu A&B, Covid) Anterior Nasal Swab   Specimen: Anterior Nasal Swab  Result Value Ref Range   SARS Coronavirus 2 by RT PCR POSITIVE (A) NEGATIVE   Influenza A by PCR NEGATIVE NEGATIVE   Influenza B by PCR NEGATIVE NEGATIVE   Resp Syncytial Virus by PCR NEGATIVE NEGATIVE  Comprehensive metabolic panel  Result Value Ref Range   Sodium 133 (L) 135 - 145 mmol/L   Potassium 3.5 3.5 - 5.1 mmol/L   Chloride 103 98 - 111 mmol/L   CO2 22 22 - 32 mmol/L   Glucose, Bld 112 (H) 70 - 99 mg/dL   BUN 18 8 - 23 mg/dL   Creatinine, Ser 0.98 0.61 - 1.24 mg/dL   Calcium 8.5 (L) 8.9 - 10.3 mg/dL   Total Protein 6.8 6.5 - 8.1 g/dL   Albumin 3.3 (L) 3.5 - 5.0 g/dL   AST 55 (H) 15 - 41 U/L   ALT 33 0 - 44 U/L   Alkaline Phosphatase 43 38 - 126 U/L   Total Bilirubin 1.4 (H) 0.3 - 1.2 mg/dL   GFR, Estimated >11 >91 mL/min    Anion gap 8 5 - 15  Lactic acid, plasma  Result Value Ref Range   Lactic Acid, Venous 0.9 0.5 - 1.9 mmol/L  CBC with Differential  Result Value Ref Range   WBC 3.4 (L) 4.0 - 10.5 K/uL   RBC 4.14 (L) 4.22 - 5.81 MIL/uL   Hemoglobin 12.4 (L) 13.0 - 17.0 g/dL   HCT 16.1 (L) 09.6 - 04.5 %   MCV 89.6 80.0 - 100.0 fL   MCH 30.0 26.0 - 34.0 pg   MCHC 33.4 30.0 - 36.0 g/dL   RDW 40.9 81.1 - 91.4 %   Platelets 126 (L) 150 - 400 K/uL   nRBC 0.0 0.0 - 0.2 %   Neutrophils Relative % 55 %   Neutro Abs 1.9 1.7 - 7.7 K/uL   Lymphocytes Relative 36 %   Lymphs Abs 1.2 0.7 - 4.0 K/uL   Monocytes Relative 9 %   Monocytes Absolute 0.3 0.1 - 1.0 K/uL   Eosinophils Relative 0 %   Eosinophils Absolute 0.0 0.0 - 0.5 K/uL   Basophils Relative 0 %   Basophils Absolute 0.0 0.0 - 0.1 K/uL   Immature Granulocytes 0 %   Abs Immature Granulocytes 0.01 0.00 - 0.07 K/uL  Brain natriuretic peptide  Result Value Ref Range   B Natriuretic Peptide 353.7 (H) 0.0 - 100.0 pg/mL  Troponin I (High Sensitivity)  Result Value Ref Range   Troponin I (High Sensitivity) 53 (H) <18 ng/L   CT Angio Chest PE W and/or Wo Contrast  Result Date: 06/14/2022 CLINICAL DATA:  Syncope/presyncope early.  Cough with fever. EXAM: CT ANGIOGRAPHY CHEST WITH CONTRAST TECHNIQUE: Multidetector CT imaging of the chest was performed using the standard protocol during bolus administration of intravenous contrast. Multiplanar CT image reconstructions and MIPs were obtained to evaluate the vascular anatomy. RADIATION DOSE REDUCTION: This exam was performed according to the departmental dose-optimization program which includes automated exposure control, adjustment of the mA and/or kV according to patient size and/or use of iterative reconstruction technique. CONTRAST:  75mL OMNIPAQUE IOHEXOL 350 MG/ML SOLN COMPARISON:  03/17/2014. FINDINGS: Cardiovascular: The heart is normal in size and there is no pericardial effusion. Multi-vessel coronary artery  calcifications are noted. Pacemaker leads are noted in the heart. There is atherosclerotic calcification of the aorta without evidence of aneurysm. Pulmonary trunk is normal in caliber. There is a questionable pulmonary artery filling defect in a segmental right lower lobe pulmonary artery. Examination is limited due to respiratory motion. No evidence of right heart strain. Mediastinum/Nodes: No mediastinal or hilar lymphadenopathy. There is a nonspecific prominent lymph node at the right hilum measuring 1.3 cm. The thyroid gland, trachea, and esophagus are within normal limits. Lungs/Pleura: Few scattered ground-glass opacities are present in the lungs bilaterally. There are trace bilateral pleural effusions. No pneumothorax is seen. Upper Abdomen: No acute abnormality. Musculoskeletal: Pacemaker device is present in the anterior chest wall on the left. Degenerative changes are present in the thoracic spine. No acute osseous abnormality. Review of the MIP images confirms the above findings. IMPRESSION: 1. Limited evaluation due to respiratory motion artifact. There is a questionable segmental pulmonary artery filling defect in the right lower lobe. No evidence of right heart strain. Consider short-term follow-up repeat evaluation or V/Q scan. 2. Scattered ground-glass opacities in the lungs bilaterally, possible edema or infiltrate. 3. Trace bilateral pleural effusions. 4. Multi-vessel coronary artery calcifications. 5. Aortic atherosclerosis. Electronically Signed   By: Thornell Sartorius M.D.   On: 06/14/2022 00:47   DG Chest 2 View  Result Date: 06/13/2022 CLINICAL DATA:  Possible sepsis with cough for 3 days EXAM: CHEST - 2 VIEW COMPARISON:  08/20/2018 FINDINGS: Cardiac shadow is stable.  Pacing device is again seen and stable. The lungs are well aerated bilaterally. No focal infiltrate or effusion is seen. No bony abnormality is noted. IMPRESSION: No acute abnormality noted. Electronically Signed   By: Alcide Clever M.D.   On: 06/13/2022 23:04   CUP PACEART REMOTE DEVICE CHECK  Result Date: 06/06/2022 Scheduled remote reviewed. Normal device function.  1 AMS, 10sec in duration, known atrial lead noise, 6 additional events for brief atrial lead noise on 5/13 HF transmission Next remote 91 days. LA, CVRS    EKG HR 60 paced rhythm and LBBB secondary to pacer    Radiology CT Angio Chest PE W and/or Wo Contrast  Result Date: 06/14/2022 CLINICAL DATA:  Syncope/presyncope early.  Cough with fever. EXAM: CT ANGIOGRAPHY CHEST WITH CONTRAST TECHNIQUE: Multidetector CT imaging of the chest was performed using the standard protocol during bolus administration of intravenous contrast. Multiplanar CT image reconstructions and MIPs were obtained to evaluate the vascular anatomy. RADIATION DOSE REDUCTION: This exam was performed according to the departmental dose-optimization program which includes automated exposure control, adjustment of the mA and/or kV according to patient size and/or use of iterative reconstruction technique. CONTRAST:  75mL OMNIPAQUE IOHEXOL 350 MG/ML SOLN COMPARISON:  03/17/2014. FINDINGS: Cardiovascular: The heart is normal in size and there is no pericardial effusion. Multi-vessel coronary artery calcifications are noted. Pacemaker leads are noted in the heart. There is atherosclerotic calcification of the aorta without evidence of aneurysm. Pulmonary trunk is normal in caliber. There is a questionable pulmonary artery filling defect in a segmental right lower lobe pulmonary artery. Examination is limited due to respiratory motion. No evidence of right heart strain. Mediastinum/Nodes: No mediastinal or hilar lymphadenopathy. There is a nonspecific prominent lymph node at the right hilum measuring 1.3 cm. The thyroid gland, trachea, and esophagus are within normal limits. Lungs/Pleura: Few scattered ground-glass opacities are present in the lungs bilaterally. There are trace bilateral pleural  effusions. No pneumothorax is seen. Upper Abdomen: No acute abnormality. Musculoskeletal: Pacemaker device is present in the anterior chest wall on the left. Degenerative changes are present in the thoracic spine. No acute osseous abnormality. Review of the MIP images confirms the above findings. IMPRESSION: 1. Limited evaluation due to respiratory motion artifact. There is a questionable segmental pulmonary artery filling defect in the right lower lobe. No evidence of right heart strain. Consider short-term follow-up repeat evaluation or V/Q scan. 2. Scattered ground-glass opacities in the lungs bilaterally, possible edema or infiltrate. 3. Trace bilateral pleural effusions. 4. Multi-vessel coronary artery calcifications. 5. Aortic atherosclerosis. Electronically Signed   By: Thornell Sartorius M.D.   On: 06/14/2022 00:47   DG Chest 2 View  Result Date: 06/13/2022 CLINICAL DATA:  Possible sepsis with cough for 3 days EXAM: CHEST - 2 VIEW COMPARISON:  08/20/2018 FINDINGS: Cardiac shadow is stable. Pacing device is again seen and stable. The lungs are well aerated bilaterally. No focal infiltrate or effusion is seen. No bony abnormality is noted. IMPRESSION: No acute abnormality noted. Electronically Signed   By: Alcide Clever M.D.   On: 06/13/2022 23:04    Procedures Procedures    Medications Ordered in ED Medications  heparin ADULT infusion 100 units/mL (25000 units/24mL) (has no administration in time range)  heparin bolus via infusion 4,000 Units (has no administration in time range)  glucagon (human recombinant) (GLUCAGEN) injection 2 mg (2 mg Intravenous Given 06/13/22 2328)  iohexol (OMNIPAQUE) 350 MG/ML injection 75 mL (75 mLs Intravenous Contrast Given 06/14/22 0029)  ED Course/ Medical Decision Making/ A&P                             Medical Decision Making Patient with pain and cough and sore throat ans subjective fever   Problems Addressed: COVID-19: acute illness or injury Elevated  troponin I level: acute illness or injury    Details: Admitting to medicine, likely demand related secondary to covid and PE Near syncope: acute illness or injury    Details: Likely due to low blood pressure, reversed beta blocker with glucagon  Single subsegmental pulmonary embolism without acute cor pulmonale (HCC): acute illness or injury    Details: Heparin initiated I the ED  Amount and/or Complexity of Data Reviewed External Data Reviewed: labs, ECG and notes.    Details: Previous notes reviewed  Labs: ordered.    Details: COVID positive.  Elevated troponin 53.  Low white count 3.4, slight low hemoglobin 12.4, low platelets 126, elevated BNP 353.7, sodium 133, potassium normal 3.5, normal creatinine 1.08  Radiology: ordered and independent interpretation performed.    Details: Covid on CTA by me, negative CXR by me  ECG/medicine tests: ordered and independent interpretation performed. Decision-making details documented in ED Course.    Details: See documentation   Risk Prescription drug management. Decision regarding hospitalization. Risk Details: Heparin initiated for PE, will admit for acute PE, elevated troponin and covid 19   Critical Care Total time providing critical care: 60 minutes (Bedside care, potential for serious outcomes reassessment and heparin drip for PE)   CRITICAL CARE Performed by: Alben Jepsen K Shontay Wallner-Rasch Total critical care time: 60  minutes Critical care time was exclusive of separately billable procedures and treating other patients. Critical care was necessary to treat or prevent imminent or life-threatening deterioration. Critical care was time spent personally by me on the following activities: development of treatment plan with patient and/or surrogate as well as nursing, discussions with consultants, evaluation of patient's response to treatment, examination of patient, obtaining history from patient or surrogate, ordering and performing treatments and  interventions, ordering and review of laboratory studies, ordering and review of radiographic studies, pulse oximetry and re-evaluation of patient's condition.  Final Clinical Impression(s) / ED Diagnoses Final diagnoses:  COVID-19  Elevated troponin I level  Near syncope  Single subsegmental pulmonary embolism without acute cor pulmonale (HCC)   The patient appears reasonably stabilized for admission considering the current resources, flow, and capabilities available in the ED at this time, and I doubt any other [facility] requiring further screening and/or treatment in the ED prior to admission.  Rx / DC Orders ED Discharge Orders     None         Azlee Monforte, MD 06/14/22 1610

## 2022-06-14 NOTE — Progress Notes (Signed)
Subjective: Patient admitted this morning, see detailed H&P by Dr Margo Aye 72 year old male with history of chronic HFrEF 45 to 50%, paroxysmal atrial fibrillation not on oral anticoagulation, CAD, nonischemic cardiomyopathy, symptomatic bradycardia due to sinus node dysfunction/AV block s/p pacemaker placement initially presented to ED with persistent cough, fever and sore throat for past 3 days.  Also had pleuritic chest pain with fatigue.  CT chest showed questionable segmental pulmonary artery filling defect in the right lower lobe.  No evidence of right heart strain.  Patient started on heparin drip.  Also found to be COVID-19 positive.  Started on the severe and dexamethasone.  Currently not requiring oxygen.  Vitals:   06/14/22 0320 06/14/22 0801  BP: 112/65 138/87  Pulse: 62 62  Resp: 17 17  Temp: 98.8 F (37.1 C) 98.1 F (36.7 C)  SpO2: 98% 95%      A/P  COVID-19 infection -Not requiring oxygen -Will discontinue remdesivir and dexamethasone and start Paxlovid  ?  Pulmonary embolism -Questionable filling defect seen on CTA chest -Recommend repeat CTA chest versus VQ scan -Will obtain venous duplex of lower extremities to rule out DVT -Continue IV heparin -D-dimer elevated 1.04, which could be from COVID 19 infection -Follow 2D echo  History of paroxysmal atrial fibrillation -Cardiology notes from EP mentioned that patient has paroxysmal A-fib -CHA2DS2VASc score 6; was supposed to be on Eliquis however patient denies taking Eliquis -Will check with cardiology whether patient does have A-fib, will need anticoagulation regardless  Troponin elevation -Likely in setting of COVID-19 infection -Troponin is downtrending, 53, 50  Chronic HFrEF -Continue Lasix  Hypertension -BP stable -Continue Coreg   Hyperlipidemia -Continue Crestor  CAD -Continue aspirin, Crestor  BPH -Continue Flomax    Kirra Verga S [place] [facility]

## 2022-06-14 NOTE — Progress Notes (Signed)
Bilateral lower extremity venous duplex has been completed. Preliminary results can be found in CV Proc through chart review.  Results were given to Dr. Sharl Ma.  06/14/22 3:15 PM Olen Cordial RVT

## 2022-06-15 ENCOUNTER — Inpatient Hospital Stay (HOSPITAL_COMMUNITY): Payer: Medicare Other

## 2022-06-15 ENCOUNTER — Other Ambulatory Visit (HOSPITAL_COMMUNITY): Payer: Self-pay

## 2022-06-15 DIAGNOSIS — J9600 Acute respiratory failure, unspecified whether with hypoxia or hypercapnia: Secondary | ICD-10-CM | POA: Diagnosis not present

## 2022-06-15 DIAGNOSIS — I82431 Acute embolism and thrombosis of right popliteal vein: Secondary | ICD-10-CM | POA: Diagnosis not present

## 2022-06-15 DIAGNOSIS — I2693 Single subsegmental pulmonary embolism without acute cor pulmonale: Secondary | ICD-10-CM | POA: Diagnosis not present

## 2022-06-15 DIAGNOSIS — U071 COVID-19: Secondary | ICD-10-CM | POA: Diagnosis not present

## 2022-06-15 LAB — ECHOCARDIOGRAM COMPLETE
AR max vel: 3.12 cm2
AV Area VTI: 3.18 cm2
AV Area mean vel: 3.15 cm2
AV Mean grad: 7 mmHg
AV Peak grad: 13.2 mmHg
Ao pk vel: 1.82 m/s
Area-P 1/2: 4.21 cm2
Calc EF: 47 %
Height: 76 in
MV VTI: 4.32 cm2
S' Lateral: 3.4 cm
Single Plane A2C EF: 44.3 %
Single Plane A4C EF: 48.4 %
Weight: 3486.8 oz

## 2022-06-15 LAB — CBC
HCT: 42.2 % (ref 39.0–52.0)
Hemoglobin: 13.8 g/dL (ref 13.0–17.0)
MCH: 30.5 pg (ref 26.0–34.0)
MCHC: 32.7 g/dL (ref 30.0–36.0)
MCV: 93.4 fL (ref 80.0–100.0)
Platelets: 144 10*3/uL — ABNORMAL LOW (ref 150–400)
RBC: 4.52 MIL/uL (ref 4.22–5.81)
RDW: 13.4 % (ref 11.5–15.5)
WBC: 4.5 10*3/uL (ref 4.0–10.5)
nRBC: 0 % (ref 0.0–0.2)

## 2022-06-15 LAB — HEPARIN LEVEL (UNFRACTIONATED): Heparin Unfractionated: 0.55 IU/mL (ref 0.30–0.70)

## 2022-06-15 LAB — D-DIMER, QUANTITATIVE: D-Dimer, Quant: 0.63 ug/mL-FEU — ABNORMAL HIGH (ref 0.00–0.50)

## 2022-06-15 MED ORDER — APIXABAN (ELIQUIS) VTE STARTER PACK (10MG AND 5MG)
ORAL_TABLET | ORAL | 0 refills | Status: DC
  Filled 2022-06-15: qty 74, 30d supply, fill #0

## 2022-06-15 MED ORDER — APIXABAN 5 MG PO TABS: 5.0000 mg | ORAL_TABLET | Freq: Two times a day (BID) | ORAL | Status: DC

## 2022-06-15 MED ORDER — POTASSIUM CHLORIDE CRYS ER 20 MEQ PO TBCR
40.0000 meq | EXTENDED_RELEASE_TABLET | Freq: Once | ORAL | Status: AC
  Administered 2022-06-15: 40 meq via ORAL
  Filled 2022-06-15: qty 2

## 2022-06-15 MED ORDER — PERFLUTREN LIPID MICROSPHERE
1.0000 mL | INTRAVENOUS | Status: AC | PRN
  Administered 2022-06-15: 4 mL via INTRAVENOUS

## 2022-06-15 MED ORDER — APIXABAN 5 MG PO TABS
10.0000 mg | ORAL_TABLET | Freq: Two times a day (BID) | ORAL | Status: DC
  Administered 2022-06-15: 10 mg via ORAL
  Filled 2022-06-15: qty 2

## 2022-06-15 NOTE — Progress Notes (Addendum)
ANTICOAGULATION CONSULT NOTE - Follow Up Consult  Pharmacy Consult for Heparin>>Eliquis Indication: pulmonary embolus  Allergies  Allergen Reactions   Lactose Intolerance (Gi) Hives and Nausea And Vomiting    Patient Measurements: Height: 6\' 4"  (193 cm) Weight: 98.9 kg (217 lb 14.8 oz) IBW/kg (Calculated) : 86.8 Heparin Dosing Weight:  99.9 kg  Vital Signs: Temp: 97.9 F (36.6 C) (06/06 0514) Temp Source: Oral (06/06 0514) BP: 128/80 (06/06 0514) Pulse Rate: 63 (06/06 0514)  Labs: Recent Labs    06/13/22 2321 06/14/22 0153 06/14/22 0420 06/14/22 1029 06/14/22 1901 06/15/22 0430  HGB 12.4*  --  12.0*  --   --   --   HCT 37.1*  --  36.9*  --   --   --   PLT 126*  --  125*  --   --   --   HEPARINUNFRC  --   --   --  0.46 0.56 0.55  CREATININE 1.08  --  0.98  --   --   --   TROPONINIHS 53* 50*  --   --   --   --     Estimated Creatinine Clearance: 82.4 mL/min (by C-G formula based on SCr of 0.98 mg/dL).   Assessment: AC/Heme: On heparin for possible PE. Ddimer elevated and CT scan was equivocal. No RHS. Also with hx of Afib but not on chronic anticoag PTA. CHADS2VASC 6.  - Hep level 0.55, CBC not done. Hgb 12 yesterday, Plts low.  Goal of Therapy:  Heparin level 0.3-0.7 units/ml Monitor platelets by anticoagulation protocol: Yes   Plan:  Con't IV heparin at 1200 units/hr Daily HL and CBC (ordered) Plan Xarelto at discharge. Very high copay to meet deductible  Order to transition to Eliquis 10mg  BID x 7d then 5mg  BID   Denzil Bristol S. Merilynn Finland, PharmD, BCPS Clinical Staff Pharmacist [email or web address] Merilynn Finland, [employer] 06/15/2022,7:49 AM

## 2022-06-15 NOTE — Discharge Summary (Addendum)
Physician Discharge Summary   Patient: Phillip Booth MRN: [id number] DOB: 01/26/1950  Admit date:     06/13/2022  Discharge date: 06/15/22  Discharge Physician: Meredeth Ide   PCP: Verdell Face, DO   Recommendations at discharge:   Take Eliquis for 3 months Follow PCP as outpatient  Discharge Diagnoses: Principal Problem:   Acute respiratory failure due to COVID-19 [facility])  Resolved Problems:   * No resolved hospital problems. 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  Also had pleuritic chest pain with fatigue.  CT chest showed questionable segmental pulmonary artery filling defect in the right lower lobe.  No evidence of right heart strain.  Patient started on heparin drip.  Also found to be COVID-19 positive.  Started on the severe and dexamethasone.  Currently not requiring oxygen   Assessment and Plan:  COVID-19 infection -Not requiring oxygen -Remdesivir was discontinued and patient started on Paxlovid -Paxlovid discontinued as patient is currently on Eliquis which can interact with Paxlovid.    ?  Pulmonary embolism -Questionable filling defect seen on CTA chest -Recommend repeat CTA chest versus VQ scan -Venous duplex of lower extremity was positive for DVT in popliteal vein -IV heparin was changed to Eliquis -D-dimer elevated 1.04, which could be from COVID 19 infection -2D echo shows mildly reduced right ventricular function  DVT -Venous duplex positive for right popliteal vein DVT -Started on Eliquis   History of atrial tachycardia -Cardiology notes from EP mentioned that patient has paroxysmal A-fib -Called and checked with cardiology, patient does not have history  of A-fib -Does not need anticoagulation for A-fib.    Troponin elevation -Likely in setting of COVID-19 infection -Troponin is downtrending, 53, 50   Chronic HFrEF -Continue Lasix   Hypertension -BP stable -Continue Coreg     Hyperlipidemia -Continue Crestor   CAD -Continue aspirin, Crestor   BPH -Continue Flomax          Consultants:  Procedures performed:  Disposition: Home Diet recommendation:  Discharge Diet Orders (From admission, onward)     Start     Ordered   06/15/22 0000  Diet - low sodium heart healthy        06/15/22 1300           Cardiac diet DISCHARGE MEDICATION: Allergies as of 06/15/2022       Reactions   Lactose Intolerance (gi) Hives, Nausea And Vomiting        Medication List     STOP taking these medications    aspirin 81 MG tablet       TAKE these medications    amLODipine 10 MG tablet Commonly known as: NORVASC Take 1 tablet (10 mg total) by mouth daily.   carvedilol 6.25 MG tablet Commonly known as: COREG TAKE 1 TABLET BY MOUTH TWICE DAILY WITH A MEAL   dextromethorphan-guaiFENesin 30-600 MG 12hr tablet Commonly known as: MUCINEX DM Take 1 tablet by mouth 2 (two) times daily as needed for cough.   Eliquis DVT/PE Starter Pack Generic drug: Apixaban Starter Pack (10mg  and 5mg ) Take as directed on package: start with two-5mg  tablets twice daily for 7 days. On day 8, switch to one-5mg  tablet twice daily.   Flovent HFA 110 MCG/ACT inhaler Generic drug: fluticasone  Inhale 1 puff into the lungs daily as needed (Asthma).   furosemide 20 MG tablet Commonly known as: LASIX Take 20 mg by mouth daily.   isosorbide mononitrate 30 MG 24 hr tablet Commonly known as: IMDUR TAKE 1 TABLET (30 MG TOTAL) BY MOUTH DAILY.   lisinopril 40 MG tablet Commonly known as: ZESTRIL TAKE 1 TABLET (40 MG TOTAL) BY MOUTH DAILY.   montelukast 10 MG tablet Commonly known as: SINGULAIR Take 1 tablet (10 mg total) by mouth at  bedtime.   rosuvastatin 10 MG tablet Commonly known as: CRESTOR Take 1 tablet (10 mg total) by mouth daily.   sodium chloride 0.65 % Soln nasal spray Commonly known as: OCEAN Place 1 spray into both nostrils as needed for congestion.   tamsulosin 0.4 MG Caps capsule Commonly known as: FLOMAX Take 0.4 mg by mouth daily.        Discharge Exam: Filed Weights   06/13/22 2245 06/14/22 0445 06/15/22 0500  Weight: 99.8 kg 99.8 kg 98.9 kg   General-appears in no acute distress Heart-S1-S2, regular, no murmur auscultated Lungs-clear to auscultation bilaterally, no wheezing or crackles auscultated Abdomen-soft, nontender, no organomegaly Extremities-no edema in the lower extremities Neuro-alert, oriented x3, no focal deficit noted  Condition at discharge: good  The results of significant diagnostics from this hospitalization (including imaging, microbiology, ancillary and laboratory) are listed below for reference.   Imaging Studies: ECHOCARDIOGRAM COMPLETE  Result Date: 06/15/2022    ECHOCARDIOGRAM REPORT   Patient Name:   Phillip Booth Date of Exam: 06/15/2022 Medical Rec #:  [id number]     Height:       76.0 in Accession #:    [id number]    Weight:       217.9 lb Date of Birth:  December 23, 1950     BSA:          2.298 m Patient Age:    71 years      BP:           128/80 mmHg Patient Gender: M             HR:           62 bpm. Exam Location:  Inpatient Procedure: 2D Echo, Cardiac Doppler, Color Doppler and Intracardiac            Opacification Agent Indications:    Pulmonary Embolus  History:        Patient has prior history of Echocardiogram examinations, most                 recent 10/15/2020. Cardiomyopathy and CHF, Previous Myocardial                 Infarction and CAD, Arrythmias:Bradycardia, 2nd degree heart                 block and Atrial Fibrillation, Signs/Symptoms:Fever, Fatigue and                 cough, elevated troponin; Risk Factors:Acute respiratory failure                 due to  COVID, Hypertension and Dyslipidemia.  Sonographer:    Wallie Char Referring Phys: [id number] CAROLE N HALL IMPRESSIONS  1. Distal septal and inferior basal hypokinesis. Left ventricular ejection fraction, by estimation, is 50 to 55%. The left ventricle has low normal function. The left ventricle has no regional wall motion abnormalities. Left ventricular diastolic parameters are indeterminate.  2. PPM leads in RA/RV. Right ventricular  systolic function is mildly reduced. The right ventricular size is normal.  3. Right atrial size was mildly dilated.  4. The mitral valve is abnormal. Mild mitral valve regurgitation. No evidence of mitral stenosis.  5. The aortic valve is tricuspid. There is mild calcification of the aortic valve. Aortic valve regurgitation is trivial. Aortic valve sclerosis is present, with no evidence of aortic valve stenosis.  6. Aortic dilatation noted. There is mild dilatation of the aortic root, measuring 41 mm.  7. The inferior vena cava is normal in size with greater than 50% respiratory variability, suggesting right atrial pressure of 3 mmHg. FINDINGS  Left Ventricle: Distal septal and inferior basal hypokinesis. Left ventricular ejection fraction, by estimation, is 50 to 55%. The left ventricle has low normal function. The left ventricle has no regional wall motion abnormalities. Definity contrast agent was given IV to delineate the left ventricular endocardial borders. The left ventricular internal cavity size was normal in size. There is no left ventricular hypertrophy. Left ventricular diastolic parameters are indeterminate. Right Ventricle: PPM leads in RA/RV. The right ventricular size is normal. No increase in right ventricular wall thickness. Right ventricular systolic function is mildly reduced. Left Atrium: Left atrial size was normal in size. Right Atrium: Right atrial size was mildly dilated. Pericardium: There is no evidence of pericardial effusion. Mitral Valve: The mitral  valve is abnormal. There is mild thickening of the mitral valve leaflet(s). Mild mitral valve regurgitation. No evidence of mitral valve stenosis. MV peak gradient, 4.5 mmHg. The mean mitral valve gradient is 1.0 mmHg. Tricuspid Valve: The tricuspid valve is normal in structure. Tricuspid valve regurgitation is mild . No evidence of tricuspid stenosis. Aortic Valve: The aortic valve is tricuspid. There is mild calcification of the aortic valve. Aortic valve regurgitation is trivial. Aortic valve sclerosis is present, with no evidence of aortic valve stenosis. Aortic valve mean gradient measures 7.0 mmHg. Aortic valve peak gradient measures 13.2 mmHg. Aortic valve area, by VTI measures 3.18 cm. Pulmonic Valve: The pulmonic valve was normal in structure. Pulmonic valve regurgitation is trivial. No evidence of pulmonic stenosis. Aorta: Aortic dilatation noted. There is mild dilatation of the aortic root, measuring 41 mm. Venous: The inferior vena cava is normal in size with greater than 50% respiratory variability, suggesting right atrial pressure of 3 mmHg. IAS/Shunts: The interatrial septum was not well visualized.  LEFT VENTRICLE PLAX 2D LVIDd:         4.80 cm      Diastology LVIDs:         3.40 cm      LV e' medial:    8.17 cm/s LV PW:         1.20 cm      LV E/e' medial:  9.7 LV IVS:        1.00 cm      LV e' lateral:   7.70 cm/s LVOT diam:     2.30 cm      LV E/e' lateral: 10.3 LV SV:         115 LV SV Index:   50 LVOT Area:     4.15 cm  LV Volumes (MOD) LV vol d, MOD A2C: 119.0 ml LV vol d, MOD A4C: 163.0 ml LV vol s, MOD A2C: 66.3 ml LV vol s, MOD A4C: 84.1 ml LV SV MOD A2C:     52.7 ml LV SV MOD A4C:     163.0 ml LV SV MOD BP:  66.4 ml RIGHT VENTRICLE             IVC RV Basal diam:  4.40 cm     IVC diam: 1.50 cm RV S prime:     19.40 cm/s TAPSE (M-mode): 2.4 cm LEFT ATRIUM             Index        RIGHT ATRIUM           Index LA diam:        3.60 cm 1.57 cm/m   RA Area:     19.80 cm LA Vol (A2C):    51.8 ml 22.55 ml/m  RA Volume:   59.50 ml  25.90 ml/m LA Vol (A4C):   55.5 ml 24.16 ml/m LA Biplane Vol: 54.6 ml 23.76 ml/m  AORTIC VALVE AV Area (Vmax):    3.12 cm AV Area (Vmean):   3.15 cm AV Area (VTI):     3.18 cm AV Vmax:           182.00 cm/s AV Vmean:          126.000 cm/s AV VTI:            0.362 m AV Peak Grad:      13.2 mmHg AV Mean Grad:      7.0 mmHg LVOT Vmax:         136.50 cm/s LVOT Vmean:        95.650 cm/s LVOT VTI:          0.276 m LVOT/AV VTI ratio: 0.76  AORTA Ao Root diam: 4.10 cm Ao Asc diam:  3.60 cm MITRAL VALVE               TRICUSPID VALVE MV Area (PHT): 4.21 cm    TR Peak grad:   40.7 mmHg MV Area VTI:   4.32 cm    TR Vmax:        319.00 cm/s MV Peak grad:  4.5 mmHg MV Mean grad:  1.0 mmHg    SHUNTS MV Vmax:       1.06 m/s    Systemic VTI:  0.28 m MV Vmean:      41.4 cm/s   Systemic Diam: 2.30 cm MV Decel Time: 180 msec MV E velocity: 79.20 cm/s MV A velocity: 28.50 cm/s MV E/A ratio:  2.78 Charlton Haws MD Electronically signed by Charlton Haws MD Signature Date/Time: 06/15/2022/10:47:02 AM    Final    VAS [name] LOWER EXTREMITY VENOUS (DVT)  Result Date: 06/14/2022  Lower Venous DVT Study Patient Name:  Phillip Booth  Date of Exam:   06/14/2022 Medical Rec #: [id number]      Accession #:    [id number] Date of Birth: 1950-07-03      Patient Gender: M Patient Age:   11 years Exam Location:  [facility] Procedure:      VAS [name] LOWER EXTREMITY VENOUS (DVT) Referring Phys: Sibyl Parr Joelle Flessner --------------------------------------------------------------------------------  Indications: Swelling.  Risk Factors: COVID 19 positive. Comparison Study: No prior studies. Performing Technologist: Chanda Busing RVT  Examination Guidelines: A complete evaluation includes B-mode imaging, spectral Doppler, color Doppler, and power Doppler as needed of all accessible portions of each vessel. Bilateral testing is considered an integral part of a complete examination. Limited examinations for reoccurring  indications may be performed as noted. The reflux portion of the exam is performed with the patient in reverse Trendelenburg.  +---------+---------------+---------+-----------+----------+--------------+ RIGHT    CompressibilityPhasicitySpontaneityPropertiesThrombus Aging +---------+---------------+---------+-----------+----------+--------------+ CFV  Full           Yes      Yes                                 +---------+---------------+---------+-----------+----------+--------------+ SFJ      Full                                                        +---------+---------------+---------+-----------+----------+--------------+ FV Prox  Full                                                        +---------+---------------+---------+-----------+----------+--------------+ FV Mid   Full                                                        +---------+---------------+---------+-----------+----------+--------------+ FV DistalFull                                                        +---------+---------------+---------+-----------+----------+--------------+ PFV      Full                                                        +---------+---------------+---------+-----------+----------+--------------+ POP      None           No       No                   Acute          +---------+---------------+---------+-----------+----------+--------------+ PTV      Full                                                        +---------+---------------+---------+-----------+----------+--------------+ PERO     Full                                                        +---------+---------------+---------+-----------+----------+--------------+ Thrombus located in the popliteal vein is noted to be in the distal segment only.  +---------+---------------+---------+-----------+----------+--------------+ LEFT      CompressibilityPhasicitySpontaneityPropertiesThrombus Aging +---------+---------------+---------+-----------+----------+--------------+ CFV      Full           Yes      Yes                                 +---------+---------------+---------+-----------+----------+--------------+  SFJ      Full                                                        +---------+---------------+---------+-----------+----------+--------------+ FV Prox  Full                                                        +---------+---------------+---------+-----------+----------+--------------+ FV Mid   Full                                                        +---------+---------------+---------+-----------+----------+--------------+ FV DistalFull                                                        +---------+---------------+---------+-----------+----------+--------------+ PFV      Full                                                        +---------+---------------+---------+-----------+----------+--------------+ POP      Full           Yes      Yes                                 +---------+---------------+---------+-----------+----------+--------------+ PTV      Full                                                        +---------+---------------+---------+-----------+----------+--------------+ PERO     Full                                                        +---------+---------------+---------+-----------+----------+--------------+     Summary: RIGHT: - Findings consistent with acute deep vein thrombosis involving the right popliteal vein. - No cystic structure found in the popliteal fossa.  LEFT: - There is no evidence of deep vein thrombosis in the lower extremity.  - No cystic structure found in the popliteal fossa.  *See table(s) above for measurements and observations. Electronically signed by Heath Lark on 06/14/2022 at 6:07:04 PM.    Final    CT Angio  Chest PE W and/or Wo Contrast  Result Date: 06/14/2022 CLINICAL DATA:  Syncope/presyncope early.  Cough with fever. EXAM: CT ANGIOGRAPHY CHEST WITH CONTRAST TECHNIQUE: Multidetector CT imaging of the chest  was performed using the standard protocol during bolus administration of intravenous contrast. Multiplanar CT image reconstructions and MIPs were obtained to evaluate the vascular anatomy. RADIATION DOSE REDUCTION: This exam was performed according to the departmental dose-optimization program which includes automated exposure control, adjustment of the mA and/or kV according to patient size and/or use of iterative reconstruction technique. CONTRAST:  75mL OMNIPAQUE IOHEXOL 350 MG/ML SOLN COMPARISON:  03/17/2014. FINDINGS: Cardiovascular: The heart is normal in size and there is no pericardial effusion. Multi-vessel coronary artery calcifications are noted. Pacemaker leads are noted in the heart. There is atherosclerotic calcification of the aorta without evidence of aneurysm. Pulmonary trunk is normal in caliber. There is a questionable pulmonary artery filling defect in a segmental right lower lobe pulmonary artery. Examination is limited due to respiratory motion. No evidence of right heart strain. Mediastinum/Nodes: No mediastinal or hilar lymphadenopathy. There is a nonspecific prominent lymph node at the right hilum measuring 1.3 cm. The thyroid gland, trachea, and esophagus are within normal limits. Lungs/Pleura: Few scattered ground-glass opacities are present in the lungs bilaterally. There are trace bilateral pleural effusions. No pneumothorax is seen. Upper Abdomen: No acute abnormality. Musculoskeletal: Pacemaker device is present in the anterior chest wall on the left. Degenerative changes are present in the thoracic spine. No acute osseous abnormality. Review of the MIP images confirms the above findings. IMPRESSION: 1. Limited evaluation due to respiratory motion artifact. There is a questionable  segmental pulmonary artery filling defect in the right lower lobe. No evidence of right heart strain. Consider short-term follow-up repeat evaluation or V/Q scan. 2. Scattered ground-glass opacities in the lungs bilaterally, possible edema or infiltrate. 3. Trace bilateral pleural effusions. 4. Multi-vessel coronary artery calcifications. 5. Aortic atherosclerosis. Electronically Signed   By: Thornell Sartorius M.D.   On: 06/14/2022 00:47   DG Chest 2 View  Result Date: 06/13/2022 CLINICAL DATA:  Possible sepsis with cough for 3 days EXAM: CHEST - 2 VIEW COMPARISON:  08/20/2018 FINDINGS: Cardiac shadow is stable. Pacing device is again seen and stable. The lungs are well aerated bilaterally. No focal infiltrate or effusion is seen. No bony abnormality is noted. IMPRESSION: No acute abnormality noted. Electronically Signed   By: Alcide Clever M.D.   On: 06/13/2022 23:04   CUP PACEART REMOTE DEVICE CHECK  Result Date: 06/06/2022 Scheduled remote reviewed. Normal device function.  1 AMS, 10sec in duration, known atrial lead noise, 6 additional events for brief atrial lead noise on 5/13 HF transmission Next remote 91 days. LA, CVRS   Microbiology: Results for orders placed or performed during the hospital encounter of 06/13/22  Resp panel by RT-PCR (RSV, Flu A&B, Covid) Anterior Nasal Swab     Status: Abnormal   Collection Time: 06/13/22 10:56 PM   Specimen: Anterior Nasal Swab  Result Value Ref Range Status   SARS Coronavirus 2 by RT PCR POSITIVE (A) NEGATIVE Final    Comment: (NOTE) SARS-CoV-2 target nucleic acids are DETECTED.  The SARS-CoV-2 RNA is generally detectable in upper respiratory specimens during the acute phase of infection. Positive results are indicative of the presence of the identified virus, but do not rule out bacterial infection or co-infection with other pathogens not detected by the test. Clinical correlation with patient history and other diagnostic information is necessary to  determine patient infection status. The expected result is Negative.  Fact Sheet for Patients: BloggerCourse.com  Fact Sheet for Healthcare Providers: SeriousBroker.it  This test is not yet approved or cleared by the [place]  FDA and  has been authorized for detection and/or diagnosis of SARS-CoV-2 by FDA under an Emergency Use Authorization (EUA).  This EUA will remain in effect (meaning this test can be used) for the duration of  the COVID-19 declaration under Section 564(b)(1) of the A ct, 21 U.S.C. section 360bbb-3(b)(1), unless the authorization is terminated or revoked sooner.     Influenza A by PCR NEGATIVE NEGATIVE Final   Influenza B by PCR NEGATIVE NEGATIVE Final    Comment: (NOTE) The Xpert Xpress SARS-CoV-2/FLU/RSV plus assay is intended as an aid in the diagnosis of influenza from Nasopharyngeal swab specimens and should not be used as a sole basis for treatment. Nasal washings and aspirates are unacceptable for Xpert Xpress SARS-CoV-2/FLU/RSV testing.  Fact Sheet for Patients: BloggerCourse.com  Fact Sheet for Healthcare Providers: SeriousBroker.it  This test is not yet approved or cleared by the [place] FDA and has been authorized for detection and/or diagnosis of SARS-CoV-2 by FDA under an Emergency Use Authorization (EUA). This EUA will remain in effect (meaning this test can be used) for the duration of the COVID-19 declaration under Section 564(b)(1) of the Act, 21 U.S.C. section 360bbb-3(b)(1), unless the authorization is terminated or revoked.     Resp Syncytial Virus by PCR NEGATIVE NEGATIVE Final    Comment: (NOTE) Fact Sheet for Patients: BloggerCourse.com  Fact Sheet for Healthcare Providers: SeriousBroker.it  This test is not yet approved or cleared by the [place] FDA and has  been authorized for detection and/or diagnosis of SARS-CoV-2 by FDA under an Emergency Use Authorization (EUA). This EUA will remain in effect (meaning this test can be used) for the duration of the COVID-19 declaration under Section 564(b)(1) of the Act, 21 U.S.C. section 360bbb-3(b)(1), unless the authorization is terminated or revoked.  Performed at [facility], [address] [place], [place] [address]     Labs: CBC: Recent Labs  Lab 06/13/22 2321 06/14/22 0420 06/15/22 0928  WBC 3.4* 3.7* 4.5  NEUTROABS 1.9  --   --   HGB 12.4* 12.0* 13.8  HCT 37.1* 36.9* 42.2  MCV 89.6 91.6 93.4  PLT 126* 125* 144*   Basic Metabolic Panel: Recent Labs  Lab 06/13/22 2321 06/14/22 0420  NA 133* 134*  K 3.5 3.4*  CL 103 103  CO2 22 21*  GLUCOSE 112* 122*  BUN 18 17  CREATININE 1.08 0.98  CALCIUM 8.5* 8.5*  MG  --  2.2  PHOS  --  3.6   Liver Function Tests: Recent Labs  Lab 06/13/22 2321  AST 55*  ALT 33  ALKPHOS 43  BILITOT 1.4*  PROT 6.8  ALBUMIN 3.3*   CBG: No results for input(s): "GLUCAP" in the last 168 hours.  Discharge time spent: greater than 30 minutes.  Signed: Meredeth Ide, MD [facility] 06/15/2022

## 2022-06-15 NOTE — TOC Benefit Eligibility Note (Signed)
Patient Product/process development scientist completed.    The patient is currently admitted and upon discharge could be taking Eliquis 5 mg.  The current 30 day co-pay is $448.09 due to a $401.09 deductible remaining.  Will be $47.00 once deductible is met.   The patient is insured through [employer]   This test claim was processed through [facility]- copay amounts may vary at other pharmacies due to pharmacy/plan contracts, or as the patient moves through the different stages of their insurance plan.  Roland Earl, CPHT Pharmacy Patient Advocate Specialist [facility] Patient Advocate Team Direct Number: [phone]  Fax: [phone]

## 2022-06-15 NOTE — Discharge Instructions (Addendum)
Do not take Paxlovid with Eliquis. Stop Paxlovid Stop ASPIRIN until you complete the Eliquis. Then you may resume.  Information on my medicine - ELIQUIS (apixaban)  Why was Eliquis prescribed for you? Eliquis was prescribed to treat blood clots that may have been found in the veins of your legs (deep vein thrombosis) or in your lungs (pulmonary embolism) and to reduce the risk of them occurring again.  What do You need to know about Eliquis ? The starting dose is 10 mg (two 5 mg tablets) taken TWICE daily for the FIRST SEVEN (7) DAYS, then on (enter date)  06/22/22 the dose is reduced to ONE 5 mg tablet taken TWICE daily.  Eliquis may be taken with or without food.   Try to take the dose about the same time in the morning and in the evening. If you have difficulty swallowing the tablet whole please discuss with your pharmacist how to take the medication safely.  Take Eliquis exactly as prescribed and DO NOT stop taking Eliquis without talking to the doctor who prescribed the medication.  Stopping may increase your risk of developing a new blood clot.  Refill your prescription before you run out.  After discharge, you should have regular check-up appointments with your healthcare provider that is prescribing your Eliquis.    What do you do if you miss a dose? If a dose of ELIQUIS is not taken at the scheduled time, take it as soon as possible on the same day and twice-daily administration should be resumed. The dose should not be doubled to make up for a missed dose.  Important Safety Information A possible side effect of Eliquis is bleeding. You should call your healthcare provider right away if you experience any of the following: Bleeding from an injury or your nose that does not stop. Unusual colored urine (red or dark brown) or unusual colored stools (red or black). Unusual bruising for unknown reasons. A serious fall or if you hit your head (even if there is no  bleeding).  Some medicines may interact with Eliquis and might increase your risk of bleeding or clotting while on Eliquis. To help avoid this, consult your healthcare provider or pharmacist prior to using any new prescription or non-prescription medications, including herbals, vitamins, non-steroidal anti-inflammatory drugs (NSAIDs) and supplements.  This website has more information on Eliquis (apixaban): http://www.eliquis.com/eliquis/home

## 2022-06-26 ENCOUNTER — Ambulatory Visit: Payer: Medicare Other | Attending: Cardiology

## 2022-06-26 DIAGNOSIS — Z95810 Presence of automatic (implantable) cardiac defibrillator: Secondary | ICD-10-CM

## 2022-06-26 DIAGNOSIS — I5042 Chronic combined systolic (congestive) and diastolic (congestive) heart failure: Secondary | ICD-10-CM

## 2022-06-28 NOTE — Progress Notes (Signed)
Remote pacemaker transmission.   

## 2022-06-30 NOTE — Progress Notes (Signed)
EPIC Encounter for ICM Monitoring  Patient Name: Phillip Booth is a 72 y.o. male Date: 06/30/2022 Primary Care Physican: Verdell Face, DO Primary Cardiologist: Anne Fu Electrophysiologist: Mealor Bi-V Pacing:  >99%      03/13/2022 Weight: 219 lbs   AT/AF Burden: <1% (no OAC)   Spoke with patient and heart failure questions reviewed.  Transmission results reviewed.  Pt asymptomatic for fluid accumulation.  Hospitalization 6/4 for COVID.    Corvue thoracic impedance suggesting normal fluid levels.   Prescribed: Furosemide 20 mg 1 tablet daily   Labs: 06/14/2022 Creatinine 0.98, BUN 17, Potassium 3.4, Sodium 134, GFR >60  06/13/2022 Creatinine 1.08, BUN 18, Potassium 3.5, Sodium 133, GFR >60  A complete set of results can be found in Results Review.   Recommendations:   No changes and encouraged to call if experiencing any fluid symptoms.   Follow-up plan: ICM clinic phone appointment on 07/31/2022.   91 day device clinic remote transmission 09/05/2022.     EP/Cardiology Office Visits:  Recall 08/27/2022 with Dr Anne Fu.  Recall 03/08/2023 with Dr Nelly Laurence.    Copy of ICM check sent to Dr. Nelly Laurence.   3 month ICM trend: 06/26/2022.    12-14 Month ICM trend:     Karie Soda, RN 06/30/2022 3:50 PM

## 2022-07-31 ENCOUNTER — Ambulatory Visit: Payer: Medicare Other | Attending: Cardiovascular Disease

## 2022-07-31 DIAGNOSIS — Z95810 Presence of automatic (implantable) cardiac defibrillator: Secondary | ICD-10-CM

## 2022-07-31 DIAGNOSIS — I5042 Chronic combined systolic (congestive) and diastolic (congestive) heart failure: Secondary | ICD-10-CM | POA: Diagnosis not present

## 2022-08-02 NOTE — Progress Notes (Signed)
EPIC Encounter for ICM Monitoring  Patient Name: Phillip Booth is a 72 y.o. male Date: 08/02/2022 Primary Care Physican: Verdell Face, DO Primary Cardiologist: Anne Fu Electrophysiologist: Mealor Bi-V Pacing:  >99%      03/13/2022 Weight: 219 lbs   AT/AF Burden: <1% (no OAC)   Spoke with patient and heart failure questions reviewed.  Transmission results reviewed.  Pt asymptomatic for fluid accumulation.  Reports feeling well at this time and voices no complaints.     Corvue thoracic impedance suggesting normal fluid levels.   Prescribed: Furosemide 20 mg 1 tablet daily   Labs: 06/14/2022 Creatinine 0.98, BUN 17, Potassium 3.4, Sodium 134, GFR >60  06/13/2022 Creatinine 1.08, BUN 18, Potassium 3.5, Sodium 133, GFR >60  A complete set of results can be found in Results Review.   Recommendations:   No changes and encouraged to call if experiencing any fluid symptoms.   Follow-up plan: ICM clinic phone appointment on 09/06/2022.   91 day device clinic remote transmission 09/05/2022.     EP/Cardiology Office Visits:  Recall 08/27/2022 with Dr Anne Fu.  Recall 03/08/2023 with Dr Nelly Laurence.    Copy of ICM check sent to Dr. Nelly Laurence.   3 month ICM trend: 07/31/2022.    12-14 Month ICM trend:     Karie Soda, RN 08/02/2022 6:29 AM

## 2022-09-05 ENCOUNTER — Ambulatory Visit: Payer: Medicare Other

## 2022-09-05 DIAGNOSIS — I428 Other cardiomyopathies: Secondary | ICD-10-CM

## 2022-09-05 LAB — CUP PACEART REMOTE DEVICE CHECK
Battery Remaining Longevity: 85 mo
Battery Remaining Percentage: 93 %
Battery Voltage: 3.01 V
Brady Statistic AP VP Percent: 99 %
Brady Statistic AP VS Percent: 1 %
Brady Statistic AS VP Percent: 1 %
Brady Statistic AS VS Percent: 1 %
Brady Statistic RA Percent Paced: 99 %
Date Time Interrogation Session: 20240827041604
Implantable Lead Connection Status: 753985
Implantable Lead Connection Status: 753985
Implantable Lead Connection Status: 753985
Implantable Lead Implant Date: 20160310
Implantable Lead Implant Date: 20160310
Implantable Lead Implant Date: 20160310
Implantable Lead Location: 753858
Implantable Lead Location: 753859
Implantable Lead Location: 753860
Implantable Lead Model: 1948
Implantable Pulse Generator Implant Date: 20231129
Lead Channel Impedance Value: 390 Ohm
Lead Channel Impedance Value: 530 Ohm
Lead Channel Impedance Value: 730 Ohm
Lead Channel Pacing Threshold Amplitude: 0.5 V
Lead Channel Pacing Threshold Amplitude: 0.75 V
Lead Channel Pacing Threshold Amplitude: 1 V
Lead Channel Pacing Threshold Pulse Width: 0.4 ms
Lead Channel Pacing Threshold Pulse Width: 0.4 ms
Lead Channel Pacing Threshold Pulse Width: 0.4 ms
Lead Channel Sensing Intrinsic Amplitude: 12 mV
Lead Channel Sensing Intrinsic Amplitude: 2 mV
Lead Channel Setting Pacing Amplitude: 2 V
Lead Channel Setting Pacing Amplitude: 2.5 V
Lead Channel Setting Pacing Amplitude: 2.5 V
Lead Channel Setting Pacing Pulse Width: 0.4 ms
Lead Channel Setting Pacing Pulse Width: 0.4 ms
Lead Channel Setting Sensing Sensitivity: 4 mV
Pulse Gen Model: 3562
Pulse Gen Serial Number: 8124783

## 2022-09-06 ENCOUNTER — Telehealth: Payer: Self-pay

## 2022-09-06 ENCOUNTER — Ambulatory Visit: Payer: Medicare Other | Attending: Cardiovascular Disease

## 2022-09-06 DIAGNOSIS — I5042 Chronic combined systolic (congestive) and diastolic (congestive) heart failure: Secondary | ICD-10-CM

## 2022-09-06 DIAGNOSIS — Z95810 Presence of automatic (implantable) cardiac defibrillator: Secondary | ICD-10-CM | POA: Diagnosis not present

## 2022-09-06 NOTE — Progress Notes (Signed)
EPIC Encounter for ICM Monitoring  Patient Name: Phillip Booth is a 72 y.o. male Date: 09/06/2022 Primary Care Physican: Verdell Face, DO Primary Cardiologist: Anne Fu Electrophysiologist: Mealor Bi-V Pacing:  >99%      03/13/2022 Weight: 219 lbs   AT/AF Burden: <1% (no OAC)   Attempted call to patient and unable to reach.  Left detailed message per DPR regarding transmission. Transmission reviewed.    Corvue thoracic impedance suggesting possible starting 8/23 and returned to baseline 8/27.   Prescribed: Furosemide 20 mg 1 tablet daily   Labs: 06/14/2022 Creatinine 0.98, BUN 17, Potassium 3.4, Sodium 134, GFR >60  06/13/2022 Creatinine 1.08, BUN 18, Potassium 3.5, Sodium 133, GFR >60  A complete set of results can be found in Results Review.   Recommendations:   Left voice mail with ICM number and encouraged to call if experiencing any fluid symptoms.   Follow-up plan: ICM clinic phone appointment on 10/16/2022.   91 day device clinic remote transmission 12/05/2022.     EP/Cardiology Office Visits:  Recall 08/27/2022 with Dr Anne Fu.  Recall 03/08/2023 with Dr Nelly Laurence.    Copy of ICM check sent to Dr. Nelly Laurence.   3 month ICM trend: 09/06/2022.    12-14 Month ICM trend:     Karie Soda, RN 09/06/2022 12:33 PM

## 2022-09-06 NOTE — Telephone Encounter (Signed)
Remote ICM transmission received.  Attempted call to patient regarding ICM remote transmission and left detailed message per DPR.  Left ICM phone number and advised to return call for any fluid symptoms or questions. Next ICM remote transmission scheduled 10/16/2022.

## 2022-09-13 NOTE — Progress Notes (Signed)
Remote pacemaker transmission.   

## 2022-10-16 ENCOUNTER — Ambulatory Visit: Payer: Medicare Other | Attending: Cardiovascular Disease

## 2022-10-16 DIAGNOSIS — I5042 Chronic combined systolic (congestive) and diastolic (congestive) heart failure: Secondary | ICD-10-CM | POA: Diagnosis not present

## 2022-10-16 DIAGNOSIS — Z95810 Presence of automatic (implantable) cardiac defibrillator: Secondary | ICD-10-CM | POA: Diagnosis not present

## 2022-10-20 NOTE — Progress Notes (Signed)
EPIC Encounter for ICM Monitoring  Patient Name: Phillip Booth is a 72 y.o. male Date: 10/20/2022 Primary Care Physican: Verdell Face, DO Primary Cardiologist: Anne Fu Electrophysiologist: Mealor Bi-V Pacing:  >99%      03/13/2022 Weight: 219 lbs   AT/AF Burden: <1% (no OAC)   Attempted call to patient and unable to reach.  Left detailed message per DPR regarding transmission. Transmission reviewed.    Corvue thoracic impedance suggesting normal fluid levels within the last month.   Prescribed: Furosemide 20 mg 1 tablet daily   Labs: 06/14/2022 Creatinine 0.98, BUN 17, Potassium 3.4, Sodium 134, GFR >60  06/13/2022 Creatinine 1.08, BUN 18, Potassium 3.5, Sodium 133, GFR >60  A complete set of results can be found in Results Review.   Recommendations:   Left voice mail with ICM number and encouraged to call if experiencing any fluid symptoms.   Follow-up plan: ICM clinic phone appointment on 11/20/2022.   91 day device clinic remote transmission 12/05/2022.     EP/Cardiology Office Visits:  Recall 08/27/2022 with Dr Anne Fu.  Recall 03/08/2023 with Dr Nelly Laurence.    Copy of ICM check sent to Dr. Nelly Laurence.   3 month ICM trend: 10/16/2022.    12-14 Month ICM trend:     Karie Soda, RN 10/20/2022 12:31 PM

## 2022-11-20 ENCOUNTER — Ambulatory Visit: Payer: Medicare Other | Attending: Cardiovascular Disease

## 2022-11-20 DIAGNOSIS — I5042 Chronic combined systolic (congestive) and diastolic (congestive) heart failure: Secondary | ICD-10-CM

## 2022-11-20 DIAGNOSIS — Z95810 Presence of automatic (implantable) cardiac defibrillator: Secondary | ICD-10-CM

## 2022-11-22 NOTE — Progress Notes (Signed)
EPIC Encounter for ICM Monitoring  Patient Name: Phillip Booth is a 72 y.o. male Date: 11/22/2022 Primary Care Physican: Verdell Face, DO Primary Cardiologist: Anne Fu Electrophysiologist: Mealor Bi-V Pacing:  >99%      03/13/2022 Weight: 219 lbs 11/22/2022 Weight:  221 lbs   AT/AF Burden: <1% (no OAC)   Spoke with patient and heart failure questions reviewed.  Transmission results reviewed.  Pt asymptomatic for fluid accumulation.  Reports feeling well at this time and voices no complaints.     Corvue thoracic impedance suggesting normal fluid levels within the last month.   Prescribed: Furosemide 20 mg 1 tablet daily   Labs: 06/14/2022 Creatinine 0.98, BUN 17, Potassium 3.4, Sodium 134, GFR >60  06/13/2022 Creatinine 1.08, BUN 18, Potassium 3.5, Sodium 133, GFR >60  A complete set of results can be found in Results Review.   Recommendations:   No changes and encouraged to call if experiencing any fluid symptoms.   Follow-up plan: ICM clinic phone appointment on 01/01/2023.   91 day device clinic remote transmission 12/05/2022.     EP/Cardiology Office Visits:  Recall 08/27/2022 with Dr Anne Fu.  Recall 03/08/2023 with Dr Nelly Laurence.    Copy of ICM check sent to Dr. Nelly Laurence.   3 month ICM trend: 11/20/2022.    12-14 Month ICM trend:     Karie Soda, RN 11/22/2022 4:03 PM

## 2022-12-05 ENCOUNTER — Ambulatory Visit: Payer: Medicare Other

## 2022-12-05 DIAGNOSIS — I441 Atrioventricular block, second degree: Secondary | ICD-10-CM

## 2022-12-05 LAB — CUP PACEART REMOTE DEVICE CHECK
Battery Remaining Longevity: 82 mo
Battery Remaining Percentage: 90 %
Battery Voltage: 3.01 V
Brady Statistic AP VP Percent: 99 %
Brady Statistic AP VS Percent: 1 %
Brady Statistic AS VP Percent: 1 %
Brady Statistic AS VS Percent: 1 %
Brady Statistic RA Percent Paced: 99 %
Date Time Interrogation Session: 20241126055444
Implantable Lead Connection Status: 753985
Implantable Lead Connection Status: 753985
Implantable Lead Connection Status: 753985
Implantable Lead Implant Date: 20160310
Implantable Lead Implant Date: 20160310
Implantable Lead Implant Date: 20160310
Implantable Lead Location: 753858
Implantable Lead Location: 753859
Implantable Lead Location: 753860
Implantable Lead Model: 1948
Implantable Pulse Generator Implant Date: 20231129
Lead Channel Impedance Value: 390 Ohm
Lead Channel Impedance Value: 510 Ohm
Lead Channel Impedance Value: 710 Ohm
Lead Channel Pacing Threshold Amplitude: 0.5 V
Lead Channel Pacing Threshold Amplitude: 0.75 V
Lead Channel Pacing Threshold Amplitude: 1 V
Lead Channel Pacing Threshold Pulse Width: 0.4 ms
Lead Channel Pacing Threshold Pulse Width: 0.4 ms
Lead Channel Pacing Threshold Pulse Width: 0.4 ms
Lead Channel Sensing Intrinsic Amplitude: 12 mV
Lead Channel Sensing Intrinsic Amplitude: 2.1 mV
Lead Channel Setting Pacing Amplitude: 2 V
Lead Channel Setting Pacing Amplitude: 2.5 V
Lead Channel Setting Pacing Amplitude: 2.5 V
Lead Channel Setting Pacing Pulse Width: 0.4 ms
Lead Channel Setting Pacing Pulse Width: 0.4 ms
Lead Channel Setting Sensing Sensitivity: 4 mV
Pulse Gen Model: 3562
Pulse Gen Serial Number: 8124783

## 2023-01-01 ENCOUNTER — Ambulatory Visit: Payer: Medicare Other | Attending: Cardiovascular Disease

## 2023-01-01 DIAGNOSIS — Z95810 Presence of automatic (implantable) cardiac defibrillator: Secondary | ICD-10-CM | POA: Diagnosis not present

## 2023-01-01 DIAGNOSIS — I5042 Chronic combined systolic (congestive) and diastolic (congestive) heart failure: Secondary | ICD-10-CM

## 2023-01-04 ENCOUNTER — Telehealth: Payer: Self-pay

## 2023-01-04 NOTE — Progress Notes (Signed)
EPIC Encounter for ICM Monitoring  Patient Name: Phillip Booth is a 72 y.o. male Date: 01/04/2023 Primary Care Physican: Verdell Face, DO Primary Cardiologist: Anne Fu Electrophysiologist: Mealor Bi-V Pacing:  >99%      03/13/2022 Weight: 219 lbs 11/22/2022 Weight:  221 lbs   AT/AF Burden: <1% (no OAC)   Attempted call to patient and unable to reach.  Left detailed message per DPR regarding transmission.  Transmission results reviewed.    Corvue thoracic impedance suggesting normal fluid levels with the exception of possible fluid accumulation from 12/1-12/10.   Prescribed: Furosemide 20 mg 1 tablet daily   Labs: 06/14/2022 Creatinine 0.98, BUN 17, Potassium 3.4, Sodium 134, GFR >60  06/13/2022 Creatinine 1.08, BUN 18, Potassium 3.5, Sodium 133, GFR >60  A complete set of results can be found in Results Review.   Recommendations:   Left voice mail with ICM number and encouraged to call if experiencing any fluid symptoms.   Follow-up plan: ICM clinic phone appointment on 02/05/2023.   91 day device clinic remote transmission 03/06/2023.     EP/Cardiology Office Visits:  Recall 08/27/2022 with Dr Anne Fu.  Recall 03/08/2023 with Dr Nelly Laurence.    Copy of ICM check sent to Dr. Nelly Laurence.   3 month ICM trend: 03/04/2022.    12-14 Month ICM trend:     Karie Soda, RN 01/04/2023 2:44 PM

## 2023-01-04 NOTE — Progress Notes (Signed)
Remote pacemaker transmission.   

## 2023-01-04 NOTE — Telephone Encounter (Signed)
Remote ICM transmission received.  Attempted call to patient regarding ICM remote transmission and left detailed message per DPR.  Left ICM phone number and advised to return call for any fluid symptoms or questions. Next ICM remote transmission scheduled 02/05/2023.

## 2023-02-05 ENCOUNTER — Ambulatory Visit: Payer: Medicare Other | Attending: Cardiovascular Disease

## 2023-02-05 DIAGNOSIS — Z95810 Presence of automatic (implantable) cardiac defibrillator: Secondary | ICD-10-CM | POA: Diagnosis not present

## 2023-02-05 DIAGNOSIS — I5042 Chronic combined systolic (congestive) and diastolic (congestive) heart failure: Secondary | ICD-10-CM | POA: Diagnosis not present

## 2023-02-07 NOTE — Progress Notes (Signed)
EPIC Encounter for ICM Monitoring  Patient Name: Phillip Booth is a 73 y.o. male Date: 02/07/2023 Primary Care Physican: Verdell Face, DO Primary Cardiologist: Anne Fu Electrophysiologist: Mealor Bi-V Pacing:  >99%      03/13/2022 Weight: 219 lbs 11/22/2022 Weight:  221 lbs 02/07/2023 Weight:    AT/AF Burden: <1% (no OAC)   Spoke with patient and heart failure questions reviewed.  Transmission results reviewed.  Pt asymptomatic for fluid accumulation.  Reports feeling well at this time and voices no complaints.     Corvue thoracic impedance suggesting intermittent days with possible fluid accumulation within the last month.   Prescribed: Furosemide 20 mg 1 tablet daily   Labs: 06/14/2022 Creatinine 0.98, BUN 17, Potassium 3.4, Sodium 134, GFR >60  06/13/2022 Creatinine 1.08, BUN 18, Potassium 3.5, Sodium 133, GFR >60  A complete set of results can be found in Results Review.   Recommendations:  No changes and encouraged to call if experiencing any fluid symptoms.   Follow-up plan: ICM clinic phone appointment on 03/12/2023.   91 day device clinic remote transmission 03/06/2023.     EP/Cardiology Office Visits:  Advised to call Dr Anne Fu office for overdue appointment.  Recall 08/27/2022 with Dr Anne Fu.  Recall 03/08/2023 with Dr Nelly Laurence.    Copy of ICM check sent to Dr. Nelly Laurence.   3 month ICM trend: 02/05/2023.    12-14 Month ICM trend:     Karie Soda, RN 02/07/2023 10:17 AM

## 2023-02-20 ENCOUNTER — Other Ambulatory Visit: Payer: Self-pay

## 2023-02-20 ENCOUNTER — Emergency Department (HOSPITAL_BASED_OUTPATIENT_CLINIC_OR_DEPARTMENT_OTHER)
Admission: EM | Admit: 2023-02-20 | Discharge: 2023-02-20 | Disposition: A | Discharge status: Home / Self Care | Payer: Medicare Other | Attending: Emergency Medicine | Admitting: Emergency Medicine

## 2023-02-20 ENCOUNTER — Encounter (HOSPITAL_BASED_OUTPATIENT_CLINIC_OR_DEPARTMENT_OTHER): Payer: Self-pay

## 2023-02-20 DIAGNOSIS — Z79899 Other long term (current) drug therapy: Secondary | ICD-10-CM | POA: Insufficient documentation

## 2023-02-20 DIAGNOSIS — Z950 Presence of cardiac pacemaker: Secondary | ICD-10-CM | POA: Insufficient documentation

## 2023-02-20 DIAGNOSIS — J101 Influenza due to other identified influenza virus with other respiratory manifestations: Secondary | ICD-10-CM | POA: Insufficient documentation

## 2023-02-20 DIAGNOSIS — Z20822 Contact with and (suspected) exposure to covid-19: Secondary | ICD-10-CM | POA: Diagnosis not present

## 2023-02-20 DIAGNOSIS — R509 Fever, unspecified: Secondary | ICD-10-CM | POA: Diagnosis present

## 2023-02-20 LAB — RESP PANEL BY RT-PCR (RSV, FLU A&B, COVID)  RVPGX2
Influenza A by PCR: POSITIVE — AB
Influenza B by PCR: NEGATIVE
Resp Syncytial Virus by PCR: NEGATIVE
SARS Coronavirus 2 by RT PCR: NEGATIVE

## 2023-02-20 MED ORDER — ACETAMINOPHEN 325 MG PO TABS
650.0000 mg | ORAL_TABLET | Freq: Once | ORAL | Status: AC
  Administered 2023-02-20: 650 mg via ORAL
  Filled 2023-02-20: qty 2

## 2023-02-20 MED ORDER — OSELTAMIVIR PHOSPHATE 75 MG PO CAPS: 75.0000 mg | ORAL_CAPSULE | Freq: Two times a day (BID) | ORAL | 0 refills | Status: DC

## 2023-02-20 NOTE — Discharge Instructions (Signed)
Begin taking Tamiflu as prescribed.  Take Tylenol 1000 mg rotated with ibuprofen 600 mg every 4 hours as needed for pain or fever.  Take over-the-counter medications as needed for symptom relief.  Drink plenty of fluids and get plenty of rest.

## 2023-02-20 NOTE — ED Provider Notes (Signed)
[facility] Provider Note   CSN: [id number] Arrival date & time: 02/20/23  0114     History  Chief Complaint  Patient presents with   Fever   Cough    Phillip Booth is a 73 y.o. male.  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  Patient presenting today with complaints of bodyaches, headache, cough that started suddenly approximately 24 hours ago.  Cough has been nonproductive.  He denies any chest pain or difficulty breathing.  No fevers at home.  He denies ill contacts.  No aggravating or alleviating factors.  The history is provided by the patient.       Home Medications Prior to Admission medications   Medication Sig Start Date End Date Taking? Authorizing Provider  amLODipine (NORVASC) 10 MG tablet Take 1 tablet (10 mg total) by mouth daily. 03/17/19 06/14/22  Netta Neat., NP  APIXABAN Everlene Balls) VTE STARTER PACK (10MG  AND 5MG ) Take as directed on package: start with two-5mg  tablets twice daily for 7 days. On day 8, switch to one-5mg  tablet twice daily. 06/15/22   Meredeth Ide, MD  carvedilol (COREG) 6.25 MG tablet TAKE 1 TABLET BY MOUTH TWICE DAILY WITH A MEAL Patient taking differently: Take 6.25 mg by mouth 2 (two) times daily with a meal. 01/01/19   Saguier, Ramon Dredge, PA-C  dextromethorphan-guaiFENesin [facility] DM) 30-600 MG 12hr tablet Take 1 tablet by mouth 2 (two) times daily as needed for cough. 12/20/21   Molpus, John, MD  fluticasone (FLOVENT HFA) 110 MCG/ACT inhaler Inhale 1 puff into the lungs daily as needed (Asthma). 07/03/19   [provider]  furosemide (LASIX) 20 MG tablet Take 20 mg by mouth daily. 05/22/18   [provider]  isosorbide mononitrate (IMDUR) 30 MG 24 hr tablet TAKE 1 TABLET (30 MG TOTAL) BY MOUTH DAILY. Patient taking differently: Take 30 mg by mouth daily. 02/26/19   Graciella Freer, PA-C  lisinopril (ZESTRIL) 40 MG tablet TAKE 1 TABLET (40 MG TOTAL)  BY MOUTH DAILY. 11/28/18   Saguier, Ramon Dredge, PA-C  montelukast (SINGULAIR) 10 MG tablet Take 1 tablet (10 mg total) by mouth at bedtime. 08/20/18   Saguier, Ramon Dredge, PA-C  rosuvastatin (CRESTOR) 10 MG tablet Take 1 tablet (10 mg total) by mouth daily. 04/18/18   Saguier, Ramon Dredge, PA-C  sodium chloride (OCEAN) 0.65 % SOLN nasal spray Place 1 spray into both nostrils as needed for congestion.    [provider]  tamsulosin (FLOMAX) 0.4 MG CAPS capsule Take 0.4 mg by mouth daily. 11/13/21   [provider]      Allergies    Lactose intolerance (gi)    Review of Systems   Review of Systems  All other systems reviewed and are negative.   Physical Exam Updated Vital Signs BP 108/65 (BP Location: Right Arm)   Pulse 71   Temp 99.8 F (37.7 C) (Oral)   Resp 20   Ht 6\' 4"  (1.93 m)   Wt 102.1 kg   SpO2 97%   BMI 27.39 kg/m  Physical Exam Vitals and nursing note reviewed.  Constitutional:      General: He is not in acute distress.    Appearance: He is well-developed. He is not diaphoretic.  HENT:     Head: Normocephalic and atraumatic.  Cardiovascular:     Rate and Rhythm: Normal rate and regular rhythm.     Heart sounds: No murmur heard.    No friction  rub.  Pulmonary:     Effort: Pulmonary effort is normal. No respiratory distress.     Breath sounds: Normal breath sounds. No wheezing or rales.  Abdominal:     General: Bowel sounds are normal. There is no distension.     Palpations: Abdomen is soft.     Tenderness: There is no abdominal tenderness.  Musculoskeletal:        General: Normal range of motion.     Cervical back: Normal range of motion and neck supple.  Skin:    General: Skin is warm and dry.  Neurological:     Mental Status: He is alert and oriented to person, place, and time.     Coordination: Coordination normal.     ED Results / Procedures / Treatments   Labs (all labs ordered are listed, but only abnormal results are displayed) Labs Reviewed   RESP PANEL BY RT-PCR (RSV, FLU A&B, COVID)  RVPGX2 - Abnormal; Notable for the following components:      Result Value   Influenza A by PCR POSITIVE (*)    All other components within normal limits    EKG None  Radiology No results found.  Procedures Procedures    Medications Ordered in ED Medications  acetaminophen (TYLENOL) tablet 650 mg (650 mg Oral Given 02/20/23 0144)    ED Course/ Medical Decision Making/ A&P  Patient is a 73 year old male presenting with URI symptoms and bodyaches as described in the HPI.  He arrives here with stable vital signs and is afebrile.  There is no hypoxia.  Physical examination unremarkable.  Influenza A swab is positive.  Patient to be treated with Tamiflu, over-the-counter medications, and follow-up as needed.  Final Clinical Impression(s) / ED Diagnoses Final diagnoses:  None    Rx / DC Orders ED Discharge Orders     None         Geoffery Lyons, MD 02/20/23 [phone]

## 2023-02-20 NOTE — ED Triage Notes (Addendum)
Pt reports cough and congestion over the last couple days. Pt febrile in triage. Pt denies any pain at present.

## 2023-03-06 ENCOUNTER — Ambulatory Visit (INDEPENDENT_AMBULATORY_CARE_PROVIDER_SITE_OTHER): Payer: Medicare Other

## 2023-03-06 DIAGNOSIS — I428 Other cardiomyopathies: Secondary | ICD-10-CM

## 2023-03-07 LAB — CUP PACEART REMOTE DEVICE CHECK
Battery Remaining Longevity: 77 mo
Battery Remaining Percentage: 87 %
Battery Voltage: 3.01 V
Brady Statistic AP VP Percent: 99 %
Brady Statistic AP VS Percent: 1 %
Brady Statistic AS VP Percent: 1.1 %
Brady Statistic AS VS Percent: 1 %
Brady Statistic RA Percent Paced: 99 %
Date Time Interrogation Session: 20250225020013
Implantable Lead Connection Status: 753985
Implantable Lead Connection Status: 753985
Implantable Lead Connection Status: 753985
Implantable Lead Implant Date: 20160310
Implantable Lead Implant Date: 20160310
Implantable Lead Implant Date: 20160310
Implantable Lead Location: 753858
Implantable Lead Location: 753859
Implantable Lead Location: 753860
Implantable Lead Model: 1948
Implantable Pulse Generator Implant Date: 20231129
Lead Channel Impedance Value: 350 Ohm
Lead Channel Impedance Value: 480 Ohm
Lead Channel Impedance Value: 700 Ohm
Lead Channel Pacing Threshold Amplitude: 0.5 V
Lead Channel Pacing Threshold Amplitude: 0.75 V
Lead Channel Pacing Threshold Amplitude: 1 V
Lead Channel Pacing Threshold Pulse Width: 0.4 ms
Lead Channel Pacing Threshold Pulse Width: 0.4 ms
Lead Channel Pacing Threshold Pulse Width: 0.4 ms
Lead Channel Sensing Intrinsic Amplitude: 12 mV
Lead Channel Sensing Intrinsic Amplitude: 2.3 mV
Lead Channel Setting Pacing Amplitude: 2 V
Lead Channel Setting Pacing Amplitude: 2.5 V
Lead Channel Setting Pacing Amplitude: 2.5 V
Lead Channel Setting Pacing Pulse Width: 0.4 ms
Lead Channel Setting Pacing Pulse Width: 0.4 ms
Lead Channel Setting Sensing Sensitivity: 4 mV
Pulse Gen Model: 3562
Pulse Gen Serial Number: 8124783

## 2023-03-12 ENCOUNTER — Ambulatory Visit: Payer: Medicare Other | Attending: Cardiovascular Disease

## 2023-03-12 DIAGNOSIS — Z95810 Presence of automatic (implantable) cardiac defibrillator: Secondary | ICD-10-CM | POA: Diagnosis not present

## 2023-03-12 DIAGNOSIS — I5042 Chronic combined systolic (congestive) and diastolic (congestive) heart failure: Secondary | ICD-10-CM

## 2023-03-13 ENCOUNTER — Telehealth: Payer: Self-pay

## 2023-03-13 ENCOUNTER — Encounter: Payer: Self-pay | Admitting: Cardiovascular Disease

## 2023-03-13 NOTE — Telephone Encounter (Signed)
 Remote ICM transmission received.  Attempted call to patient regarding ICM remote transmission and no answer.

## 2023-03-13 NOTE — Progress Notes (Signed)
 EPIC Encounter for ICM Monitoring  Patient Name: Phillip Booth is a 73 y.o. male Date: 03/13/2023 Primary Care Physican: Verdell Face, DO Primary Cardiologist: Anne Fu Electrophysiologist: Mealor Bi-V Pacing:  >99%      03/13/2022 Weight: 219 lbs 11/22/2022 Weight:  221 lbs 03/13/2023 Weight: unknown   AT/AF Burden: <1% (no OAC)   Attempted call to patient and unable to reach.    Transmission results reviewed.      Corvue thoracic impedance suggesting possible fluid accumulation starting 3/2.   Prescribed: Furosemide 20 mg 1 tablet daily   Labs: 06/14/2022 Creatinine 0.98, BUN 17, Potassium 3.4, Sodium 134, GFR >60  06/13/2022 Creatinine 1.08, BUN 18, Potassium 3.5, Sodium 133, GFR >60  A complete set of results can be found in Results Review.   Recommendations:  Unable to reach.     Follow-up plan: ICM clinic phone appointment on 03/19/2023 to recheck fluid levels.   91 day device clinic remote transmission 06/05/2023.     EP/Cardiology Office Visits:  Advised to call Dr Anne Fu office for overdue appointment.  Recall 08/27/2022 with Dr Anne Fu.  Recall 03/08/2023 with Dr Nelly Laurence.    Copy of ICM check sent to Dr. Nelly Laurence.   3 month ICM trend: 03/12/2023.    12-14 Month ICM trend:     Karie Soda, RN 03/13/2023 7:31 AM

## 2023-03-19 ENCOUNTER — Ambulatory Visit: Attending: Cardiovascular Disease

## 2023-03-19 DIAGNOSIS — I5042 Chronic combined systolic (congestive) and diastolic (congestive) heart failure: Secondary | ICD-10-CM

## 2023-03-19 DIAGNOSIS — Z95810 Presence of automatic (implantable) cardiac defibrillator: Secondary | ICD-10-CM

## 2023-03-21 NOTE — Progress Notes (Signed)
 EPIC Encounter for ICM Monitoring  Patient Name: Phillip Booth is a 73 y.o. male Date: 03/21/2023 Primary Care Physican: Verdell Face, DO Primary Cardiologist: Anne Fu Electrophysiologist: Mealor Bi-V Pacing:  >99%      03/13/2022 Weight: 219 lbs 11/22/2022 Weight:  221 lbs 03/13/2023 Weight: unknown 03/21/2023 Weight: 219 lbs   AT/AF Burden: <1% (no OAC)   Spoke with patient and heart failure questions reviewed.  Transmission results reviewed.  Pt asymptomatic for fluid accumulation.  Reports feeling well at this time and voices no complaints.       Corvue thoracic impedance suggesting fluid levels returned to normal 3/4.   Prescribed: Furosemide 20 mg 1 tablet daily   Labs: 06/14/2022 Creatinine 0.98, BUN 17, Potassium 3.4, Sodium 134, GFR >60  06/13/2022 Creatinine 1.08, BUN 18, Potassium 3.5, Sodium 133, GFR >60  A complete set of results can be found in Results Review.   Recommendations:  No changes and encouraged to call if experiencing any fluid symptoms.     Follow-up plan: ICM clinic phone appointment on 04/16/2023.   91 day device clinic remote transmission 06/05/2023.     EP/Cardiology Office Visits:  Advised to call Dr Anne Fu and Dr Morrie Sheldon office for appointments.  Recall 08/27/2022 with Dr Anne Fu.  Recall 03/08/2023 with Dr Nelly Laurence.    Copy of ICM check sent to Dr. Nelly Laurence.   3 month ICM trend: 03/19/2023.    12-14 Month ICM trend:     Karie Soda, RN 03/21/2023 7:11 AM

## 2023-04-11 NOTE — Addendum Note (Signed)
 Addended by: Elease Etienne A on: 04/11/2023 10:43 AM   Modules accepted: Orders

## 2023-04-11 NOTE — Progress Notes (Signed)
 Remote pacemaker transmission.

## 2023-04-16 ENCOUNTER — Ambulatory Visit: Attending: Cardiovascular Disease

## 2023-04-16 DIAGNOSIS — I5042 Chronic combined systolic (congestive) and diastolic (congestive) heart failure: Secondary | ICD-10-CM

## 2023-04-16 DIAGNOSIS — Z95810 Presence of automatic (implantable) cardiac defibrillator: Secondary | ICD-10-CM | POA: Diagnosis not present

## 2023-04-18 ENCOUNTER — Telehealth: Payer: Self-pay

## 2023-04-18 NOTE — Progress Notes (Signed)
 EPIC Encounter for ICM Monitoring  Patient Name: Phillip Booth is a 73 y.o. male Date: 04/18/2023 Primary Care Physican: Verdell Face, DO Primary Cardiologist: Anne Fu Electrophysiologist: Mealor Bi-V Pacing:  >99%      03/13/2022 Weight: 219 lbs 11/22/2022 Weight:  221 lbs 03/13/2023 Weight: unknown 03/21/2023 Weight: 219 lbs   AT/AF Burden: <1%    Attempted call to patient and unable to reach.  Left detailed message per DPR regarding transmission.  Transmission results reviewed.    Corvue thoracic impedance suggesting normal fluid levels in the last month.  Message sent to device clinic triage to review AT/AF burden and AT/AF long episode showing on 4/8.   Prescribed: Furosemide 20 mg 1 tablet daily   Labs: 06/14/2022 Creatinine 0.98, BUN 17, Potassium 3.4, Sodium 134, GFR >60  06/13/2022 Creatinine 1.08, BUN 18, Potassium 3.5, Sodium 133, GFR >60  A complete set of results can be found in Results Review.   Recommendations:  Left voice mail with ICM number and encouraged to call if experiencing any fluid symptoms.    Follow-up plan: ICM clinic phone appointment on 05/21/2023.   91 day device clinic remote transmission 06/05/2023.     EP/Cardiology Office Visits:  He is aware to call Dr Anne Fu and Dr Morrie Sheldon office for appointments.  Recall 08/27/2022 with Dr Anne Fu.  Recall 03/08/2023 with Dr Nelly Laurence.    Copy of ICM check sent to Dr. Nelly Laurence.   3 month ICM trend: 04/17/2023.    12-14 Month ICM trend:     Karie Soda, RN 04/18/2023 5:06 PM

## 2023-04-18 NOTE — Telephone Encounter (Signed)
 Remote ICM transmission received.  Attempted call to patient regarding ICM remote transmission and left detailed message per DPR.  Left ICM phone number and advised to return call for any fluid symptoms or questions. Next ICM remote transmission scheduled 05/21/2023.

## 2023-04-19 NOTE — Progress Notes (Signed)
 Received: Flossie Buffy, Harlen Labs, RN  Ezella Kell, Josephine Igo, RN Cleone Slim Jacki Cones thanks for the flag.  Appears longest event was a little over 2 days. Presenting not in AF.  He has hx of paroxysmal AF and is on and OAC.   We typically don't worry unless he is greater than 7 days with PAF hx. Now if he is symptomatic when you talk to the patient = then we certainly can and should reach out. Otherwise looks okay. Will continue to monitor.  Thanks, [employer]

## 2023-05-03 ENCOUNTER — Emergency Department (HOSPITAL_COMMUNITY)
Admission: EM | Admit: 2023-05-03 | Discharge: 2023-05-04 | Disposition: A | Discharge status: Home / Self Care | Attending: Emergency Medicine | Admitting: Emergency Medicine

## 2023-05-03 ENCOUNTER — Other Ambulatory Visit: Payer: Self-pay

## 2023-05-03 ENCOUNTER — Emergency Department (HOSPITAL_COMMUNITY)

## 2023-05-03 ENCOUNTER — Encounter (HOSPITAL_COMMUNITY): Payer: Self-pay | Admitting: Emergency Medicine

## 2023-05-03 DIAGNOSIS — R42 Dizziness and giddiness: Secondary | ICD-10-CM | POA: Insufficient documentation

## 2023-05-03 DIAGNOSIS — Z950 Presence of cardiac pacemaker: Secondary | ICD-10-CM | POA: Diagnosis not present

## 2023-05-03 DIAGNOSIS — I700 Atherosclerosis of aorta: Secondary | ICD-10-CM | POA: Diagnosis not present

## 2023-05-03 DIAGNOSIS — Z8673 Personal history of transient ischemic attack (TIA), and cerebral infarction without residual deficits: Secondary | ICD-10-CM | POA: Insufficient documentation

## 2023-05-03 DIAGNOSIS — S62394A Other fracture of fourth metacarpal bone, right hand, initial encounter for closed fracture: Secondary | ICD-10-CM

## 2023-05-03 DIAGNOSIS — I509 Heart failure, unspecified: Secondary | ICD-10-CM | POA: Diagnosis not present

## 2023-05-03 LAB — CBC
HCT: 40.3 % (ref 39.0–52.0)
Hemoglobin: 13.1 g/dL (ref 13.0–17.0)
MCH: 30.5 pg (ref 26.0–34.0)
MCHC: 32.5 g/dL (ref 30.0–36.0)
MCV: 93.9 fL (ref 80.0–100.0)
Platelets: 196 10*3/uL (ref 150–400)
RBC: 4.29 MIL/uL (ref 4.22–5.81)
RDW: 13.7 % (ref 11.5–15.5)
WBC: 4.2 10*3/uL (ref 4.0–10.5)
nRBC: 0 % (ref 0.0–0.2)

## 2023-05-03 LAB — BASIC METABOLIC PANEL WITH GFR
Anion gap: 6 (ref 5–15)
BUN: 19 mg/dL (ref 8–23)
CO2: 24 mmol/L (ref 22–32)
Calcium: 9.4 mg/dL (ref 8.9–10.3)
Chloride: 109 mmol/L (ref 98–111)
Creatinine, Ser: 0.96 mg/dL (ref 0.61–1.24)
GFR, Estimated: >60 mL/min (ref >60)
Glucose, Bld: 130 mg/dL — ABNORMAL HIGH (ref 70–99)
Potassium: 4 mmol/L (ref 3.5–5.1)
Sodium: 139 mmol/L (ref 135–145)

## 2023-05-03 LAB — URINALYSIS, ROUTINE W REFLEX MICROSCOPIC
Bilirubin Urine: NEGATIVE
Glucose, UA: NEGATIVE mg/dL
Hgb urine dipstick: NEGATIVE
Ketones, ur: NEGATIVE mg/dL
Leukocytes,Ua: NEGATIVE
Nitrite: NEGATIVE
Protein, ur: NEGATIVE mg/dL
Specific Gravity, Urine: 1.03 (ref 1.005–1.030)
pH: 5 (ref 5.0–8.0)

## 2023-05-03 LAB — TROPONIN I (HIGH SENSITIVITY)
Troponin I (High Sensitivity): 20 ng/L — ABNORMAL HIGH (ref <18)
Troponin I (High Sensitivity): 15 ng/L (ref <18)

## 2023-05-03 LAB — CBG MONITORING, ED: Glucose-Capillary: 130 mg/dL — ABNORMAL HIGH (ref 70–99)

## 2023-05-03 LAB — BRAIN NATRIURETIC PEPTIDE: B Natriuretic Peptide: 115.4 pg/mL — ABNORMAL HIGH (ref 0.0–100.0)

## 2023-05-03 MED ORDER — MECLIZINE HCL 25 MG PO TABS
25.0000 mg | ORAL_TABLET | Freq: Once | ORAL | Status: AC
  Administered 2023-05-03: 25 mg via ORAL
  Filled 2023-05-03: qty 1

## 2023-05-03 MED ORDER — SODIUM CHLORIDE (PF) 0.9 % IJ SOLN
INTRAMUSCULAR | Status: AC
  Filled 2023-05-03: qty 50

## 2023-05-03 MED ORDER — IOHEXOL 350 MG/ML SOLN
75.0000 mL | Freq: Once | INTRAVENOUS | Status: AC | PRN
  Administered 2023-05-03: 150 mL via INTRAVENOUS
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
## 2023-05-03 NOTE — ED Triage Notes (Signed)
 MD told him to come here. He has felt dizzy and believes this may be vertigo.

## 2023-05-04 NOTE — Discharge Instructions (Addendum)
 For your dizziness, please follow-up with neurology, they will likely be contacting you about a follow-up  For the broken hand, follow-up with Dr. Delmar Ferrara in 1 week for recheck  Your exam shows you have had an episode of vertigo, which causes a false sense of movement such as a spinning feeling or walls that seem to move.  Most vertigo is caused by a (usually temporary) problem in the inner ear. Rarely, the back part of the brain can cause vertigo (some mini-strokes/strokes), but it appears to be a low risk cause for you at this time. It is important to follow-up with your doctor however, to see if you need further testing.   Do not drive or participate in potentially dangerous activities requiring balance unless off meds (not drowsy) and the vertigo has resolved. Most of the time benign vertigo is much better after a few days. However, mild unsteadiness may last for up to 3 months in some patients. An MRI scan or other special tests to evaluate your hearing and balance may be needed if the vertigo does not improve or returns in the future.   RETURN IMMEDIATELY IF YOU HAVE ANY OF THE FOLLOWING (call 911): Increasing vertigo, earache, ear drainage, or loss of hearing.  Severe headache, blurred or double vision, or trouble walking.  Fainting or poorly responsive, extreme weakness, chest pain, or palpitations.  Fever, persistent vomiting, or dehydration.  Numbness, tingling, incoordination, or weakness of the limbs.  Change in speech, vision, swallowing, understanding, or other concerns.

## 2023-05-04 NOTE — Progress Notes (Signed)
 Orthopedic Tech Progress Note Patient Details:  Phillip Booth [birth year] [id number] Applied volar splint per order. Ortho Devices Type of Ortho Device: Volar splint, Ace wrap, Cotton web roll Ortho Device/Splint Location: RUE Ortho Device/Splint Interventions: Ordered, Application, Adjustment   Post Interventions Patient Tolerated: Well Instructions Provided: Adjustment of device, Care of device  Rayna Calkin 05/04/2023, 1:46 AM
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
## 2023-05-21 ENCOUNTER — Ambulatory Visit: Attending: Cardiovascular Disease

## 2023-05-21 DIAGNOSIS — I5042 Chronic combined systolic (congestive) and diastolic (congestive) heart failure: Secondary | ICD-10-CM

## 2023-05-21 DIAGNOSIS — Z95810 Presence of automatic (implantable) cardiac defibrillator: Secondary | ICD-10-CM | POA: Diagnosis not present

## 2023-05-23 NOTE — Progress Notes (Signed)
 EPIC Encounter for ICM Monitoring  Patient Name: Phillip Booth is a 73 y.o. male Date: 05/23/2023 Primary Care Physican: Myrl Askew, DO Primary Cardiologist: Renna Cary Electrophysiologist: Mealor Bi-V Pacing:  >99%      03/13/2022 Weight: 219 lbs 11/22/2022 Weight:  221 lbs 03/13/2023 Weight: unknown 03/21/2023 Weight: 219 lbs   AT/AF Burden: <1%    Spoke with patient and heart failure questions reviewed.  Transmission results reviewed.  Pt asymptomatic for fluid accumulation.  Reports feeling well at this time and voices no complaints.     Corvue thoracic impedance suggesting intermittent days with possible fluid accumulation within the last month.   Prescribed: Furosemide  20 mg 1 tablet daily   Labs: 05/03/2023 Creatinine 0.96, BUN 19, Potassium 4.0, Sodium 139, GFR >60, BNP 115.4 A complete set of results can be found in Results Review.   Recommendations:  No changes and encouraged to call if experiencing any fluid symptoms.   Follow-up plan: ICM clinic phone appointment on 06/25/2023.   91 day device clinic remote transmission 06/05/2023.     EP/Cardiology Office Visits:  Advised to call Dr Renna Cary and Dr Katheryne Pane office for appointments.  Recall 08/27/2022 with Dr Renna Cary (last visit 08/12/2021 with Narvis Bad PA).  Recall 03/08/2023 with Dr Arlester Ladd.    Copy of ICM check sent to Dr. Arlester Ladd.   3 month ICM trend: 05/21/2023.    12-14 Month ICM trend:     Almyra Jain, RN 05/23/2023 8:35 AM

## 2023-05-31 NOTE — Progress Notes (Signed)
 BNP ordered since patient has history of heart failure

## 2023-06-05 ENCOUNTER — Ambulatory Visit (INDEPENDENT_AMBULATORY_CARE_PROVIDER_SITE_OTHER): Payer: Medicare Other

## 2023-06-05 DIAGNOSIS — R001 Bradycardia, unspecified: Secondary | ICD-10-CM | POA: Diagnosis not present

## 2023-06-06 LAB — CUP PACEART REMOTE DEVICE CHECK
Battery Remaining Longevity: 73 mo
Battery Remaining Percentage: 83 %
Battery Voltage: 3.01 V
Brady Statistic AP VP Percent: 99 %
Brady Statistic AP VS Percent: 1 %
Brady Statistic AS VP Percent: 1.2 %
Brady Statistic AS VS Percent: 1 %
Brady Statistic RA Percent Paced: 98 %
Date Time Interrogation Session: 20250527020008
Implantable Lead Connection Status: 753985
Implantable Lead Connection Status: 753985
Implantable Lead Connection Status: 753985
Implantable Lead Implant Date: 20160310
Implantable Lead Implant Date: 20160310
Implantable Lead Implant Date: 20160310
Implantable Lead Location: 753858
Implantable Lead Location: 753859
Implantable Lead Location: 753860
Implantable Lead Model: 1948
Implantable Pulse Generator Implant Date: 20231129
Lead Channel Impedance Value: 350 Ohm
Lead Channel Impedance Value: 480 Ohm
Lead Channel Impedance Value: 680 Ohm
Lead Channel Pacing Threshold Amplitude: 0.5 V
Lead Channel Pacing Threshold Amplitude: 0.75 V
Lead Channel Pacing Threshold Amplitude: 1 V
Lead Channel Pacing Threshold Pulse Width: 0.4 ms
Lead Channel Pacing Threshold Pulse Width: 0.4 ms
Lead Channel Pacing Threshold Pulse Width: 0.4 ms
Lead Channel Sensing Intrinsic Amplitude: 12 mV
Lead Channel Sensing Intrinsic Amplitude: 2 mV
Lead Channel Setting Pacing Amplitude: 2 V
Lead Channel Setting Pacing Amplitude: 2.5 V
Lead Channel Setting Pacing Amplitude: 2.5 V
Lead Channel Setting Pacing Pulse Width: 0.4 ms
Lead Channel Setting Pacing Pulse Width: 0.4 ms
Lead Channel Setting Sensing Sensitivity: 4 mV
Pulse Gen Model: 3562
Pulse Gen Serial Number: 8124783

## 2023-06-13 ENCOUNTER — Ambulatory Visit: Payer: Self-pay | Admitting: Cardiovascular Disease

## 2023-06-22 ENCOUNTER — Observation Stay (HOSPITAL_COMMUNITY)

## 2023-06-22 ENCOUNTER — Emergency Department (HOSPITAL_COMMUNITY)

## 2023-06-22 ENCOUNTER — Observation Stay (HOSPITAL_BASED_OUTPATIENT_CLINIC_OR_DEPARTMENT_OTHER)
Admission: EM | Admit: 2023-06-22 | Discharge: 2023-06-25 | Disposition: A | Discharge status: Home / Self Care | Attending: Internal Medicine | Admitting: Internal Medicine

## 2023-06-22 ENCOUNTER — Encounter (HOSPITAL_BASED_OUTPATIENT_CLINIC_OR_DEPARTMENT_OTHER): Payer: Self-pay

## 2023-06-22 ENCOUNTER — Emergency Department (HOSPITAL_BASED_OUTPATIENT_CLINIC_OR_DEPARTMENT_OTHER)

## 2023-06-22 ENCOUNTER — Other Ambulatory Visit: Payer: Self-pay

## 2023-06-22 DIAGNOSIS — Z599 Problem related to housing and economic circumstances, unspecified: Secondary | ICD-10-CM | POA: Insufficient documentation

## 2023-06-22 DIAGNOSIS — I4891 Unspecified atrial fibrillation: Secondary | ICD-10-CM | POA: Diagnosis present

## 2023-06-22 DIAGNOSIS — I10 Essential (primary) hypertension: Secondary | ICD-10-CM | POA: Insufficient documentation

## 2023-06-22 DIAGNOSIS — I639 Cerebral infarction, unspecified: Principal | ICD-10-CM | POA: Diagnosis present

## 2023-06-22 DIAGNOSIS — Z87448 Personal history of other diseases of urinary system: Secondary | ICD-10-CM | POA: Insufficient documentation

## 2023-06-22 DIAGNOSIS — D649 Anemia, unspecified: Secondary | ICD-10-CM | POA: Insufficient documentation

## 2023-06-22 DIAGNOSIS — R41841 Cognitive communication deficit: Secondary | ICD-10-CM | POA: Diagnosis not present

## 2023-06-22 DIAGNOSIS — F10920 Alcohol use, unspecified with intoxication, uncomplicated: Secondary | ICD-10-CM | POA: Diagnosis not present

## 2023-06-22 DIAGNOSIS — R200 Anesthesia of skin: Secondary | ICD-10-CM

## 2023-06-22 DIAGNOSIS — Z79899 Other long term (current) drug therapy: Secondary | ICD-10-CM | POA: Diagnosis not present

## 2023-06-22 DIAGNOSIS — I6350 Cerebral infarction due to unspecified occlusion or stenosis of unspecified cerebral artery: Secondary | ICD-10-CM

## 2023-06-22 DIAGNOSIS — I441 Atrioventricular block, second degree: Secondary | ICD-10-CM | POA: Diagnosis present

## 2023-06-22 DIAGNOSIS — I63541 Cerebral infarction due to unspecified occlusion or stenosis of right cerebellar artery: Secondary | ICD-10-CM | POA: Diagnosis not present

## 2023-06-22 DIAGNOSIS — M79622 Pain in left upper arm: Secondary | ICD-10-CM | POA: Diagnosis present

## 2023-06-22 DIAGNOSIS — M25519 Pain in unspecified shoulder: Secondary | ICD-10-CM | POA: Insufficient documentation

## 2023-06-22 LAB — URINALYSIS, ROUTINE W REFLEX MICROSCOPIC
Bilirubin Urine: NEGATIVE
Glucose, UA: NEGATIVE mg/dL
Hgb urine dipstick: NEGATIVE
Ketones, ur: NEGATIVE mg/dL
Leukocytes,Ua: NEGATIVE
Nitrite: NEGATIVE
Protein, ur: 30 mg/dL — AB
Specific Gravity, Urine: >=1.03 (ref 1.005–1.030)
pH: 5.5 (ref 5.0–8.0)

## 2023-06-22 LAB — URINALYSIS, MICROSCOPIC (REFLEX): WBC, UA: NONE SEEN WBC/hpf (ref 0–5)

## 2023-06-22 LAB — COMPREHENSIVE METABOLIC PANEL WITH GFR
ALT: 14 U/L (ref 0–44)
AST: 23 U/L (ref 15–41)
Albumin: 3.8 g/dL (ref 3.5–5.0)
Alkaline Phosphatase: 68 U/L (ref 38–126)
Anion gap: 11 (ref 5–15)
BUN: 19 mg/dL (ref 8–23)
CO2: 22 mmol/L (ref 22–32)
Calcium: 9.2 mg/dL (ref 8.9–10.3)
Chloride: 108 mmol/L (ref 98–111)
Creatinine, Ser: 0.84 mg/dL (ref 0.61–1.24)
GFR, Estimated: >60 mL/min (ref >60)
Glucose, Bld: 100 mg/dL — ABNORMAL HIGH (ref 70–99)
Potassium: 4 mmol/L (ref 3.5–5.1)
Sodium: 141 mmol/L (ref 135–145)
Total Bilirubin: 1.2 mg/dL (ref 0.0–1.2)
Total Protein: 6.5 g/dL (ref 6.5–8.1)

## 2023-06-22 LAB — CBC
HCT: 38.2 % — ABNORMAL LOW (ref 39.0–52.0)
Hemoglobin: 12.6 g/dL — ABNORMAL LOW (ref 13.0–17.0)
MCH: 31 pg (ref 26.0–34.0)
MCHC: 33 g/dL (ref 30.0–36.0)
MCV: 93.9 fL (ref 80.0–100.0)
Platelets: 156 10*3/uL (ref 150–400)
RBC: 4.07 MIL/uL — ABNORMAL LOW (ref 4.22–5.81)
RDW: 13.7 % (ref 11.5–15.5)
WBC: 4.3 10*3/uL (ref 4.0–10.5)
nRBC: 0 % (ref 0.0–0.2)

## 2023-06-22 LAB — CBG MONITORING, ED: Glucose-Capillary: 103 mg/dL — ABNORMAL HIGH (ref 70–99)

## 2023-06-22 LAB — DIFFERENTIAL
Abs Immature Granulocytes: 0.01 10*3/uL (ref 0.00–0.07)
Basophils Absolute: 0 10*3/uL (ref 0.0–0.1)
Basophils Relative: 1 %
Eosinophils Absolute: 0.1 10*3/uL (ref 0.0–0.5)
Eosinophils Relative: 2 %
Immature Granulocytes: 0 %
Lymphocytes Relative: 47 %
Lymphs Abs: 2 10*3/uL (ref 0.7–4.0)
Monocytes Absolute: 0.5 10*3/uL (ref 0.1–1.0)
Monocytes Relative: 11 %
Neutro Abs: 1.7 10*3/uL (ref 1.7–7.7)
Neutrophils Relative %: 39 %

## 2023-06-22 LAB — URINE DRUG SCREEN
Amphetamines: NOT DETECTED
Barbiturates: NOT DETECTED
Benzodiazepines: NOT DETECTED
Cocaine: NOT DETECTED
Fentanyl: NOT DETECTED
Methadone Scn, Ur: NOT DETECTED
Opiates: NOT DETECTED
Tetrahydrocannabinol: NOT DETECTED

## 2023-06-22 LAB — HEMOGLOBIN A1C
Hgb A1c MFr Bld: 5.8 % — ABNORMAL HIGH (ref 4.8–5.6)
Mean Plasma Glucose: 119.76 mg/dL

## 2023-06-22 LAB — PROTIME-INR
INR: 1.2 (ref 0.8–1.2)
Prothrombin Time: 14.9 s (ref 11.4–15.2)

## 2023-06-22 LAB — ETHANOL: Alcohol, Ethyl (B): <15 mg/dL (ref <15)

## 2023-06-22 LAB — TROPONIN I (HIGH SENSITIVITY): Troponin I (High Sensitivity): 45 ng/L — ABNORMAL HIGH (ref <18)

## 2023-06-22 LAB — APTT: aPTT: 29 s (ref 24–36)

## 2023-06-22 MED ORDER — LISINOPRIL 20 MG PO TABS
40.0000 mg | ORAL_TABLET | Freq: Every day | ORAL | Status: DC
  Administered 2023-06-23 – 2023-06-25 (×3): 40 mg via ORAL
  Filled 2023-06-22 (×3): qty 2

## 2023-06-22 MED ORDER — APIXABAN 5 MG PO TABS: 5.0000 mg | ORAL_TABLET | Freq: Two times a day (BID) | ORAL | 2 refills | Status: AC

## 2023-06-22 MED ORDER — ISOSORBIDE MONONITRATE ER 30 MG PO TB24
30.0000 mg | ORAL_TABLET | Freq: Every day | ORAL | Status: DC
  Administered 2023-06-22 – 2023-06-25 (×4): 30 mg via ORAL
  Filled 2023-06-22 (×4): qty 1

## 2023-06-22 MED ORDER — ASPIRIN 81 MG PO TBEC
81.0000 mg | DELAYED_RELEASE_TABLET | Freq: Every day | ORAL | Status: DC
  Administered 2023-06-22 – 2023-06-25 (×4): 81 mg via ORAL
  Filled 2023-06-22 (×4): qty 1

## 2023-06-22 MED ORDER — AMLODIPINE BESYLATE 5 MG PO TABS
10.0000 mg | ORAL_TABLET | Freq: Every day | ORAL | Status: DC
  Administered 2023-06-23 – 2023-06-25 (×3): 10 mg via ORAL
  Filled 2023-06-22 (×3): qty 2

## 2023-06-22 MED ORDER — ROSUVASTATIN CALCIUM 5 MG PO TABS
10.0000 mg | ORAL_TABLET | Freq: Every day | ORAL | Status: DC
  Administered 2023-06-22 – 2023-06-25 (×4): 10 mg via ORAL
  Filled 2023-06-22 (×4): qty 2

## 2023-06-22 MED ORDER — TAMSULOSIN HCL 0.4 MG PO CAPS
0.4000 mg | ORAL_CAPSULE | Freq: Every day | ORAL | Status: DC
  Administered 2023-06-22 – 2023-06-24 (×3): 0.4 mg via ORAL
  Filled 2023-06-22 (×4): qty 1

## 2023-06-22 MED ORDER — CARVEDILOL 6.25 MG PO TABS
6.2500 mg | ORAL_TABLET | Freq: Two times a day (BID) | ORAL | Status: DC
  Administered 2023-06-22 – 2023-06-25 (×7): 6.25 mg via ORAL
  Filled 2023-06-22 (×7): qty 1

## 2023-06-22 MED ORDER — STROKE: EARLY STAGES OF RECOVERY BOOK
Freq: Once | Status: AC
  Filled 2023-06-22: qty 1

## 2023-06-22 MED ORDER — APIXABAN 2.5 MG PO TABS
5.0000 mg | ORAL_TABLET | Freq: Two times a day (BID) | ORAL | Status: DC
  Administered 2023-06-22 – 2023-06-23 (×2): 5 mg via ORAL
  Filled 2023-06-22 (×2): qty 2

## 2023-06-22 NOTE — ED Provider Notes (Signed)
   Patient states he is willing to stay in the hospital at this point in time.  Neurology and medicine updated.   Ninetta Basket, MD 06/22/23 [phone]

## 2023-06-22 NOTE — H&P (Incomplete)
 Date: 06/22/2023               Patient Name:  Phillip Booth MRN: [id number]  DOB: Oct 13, 1950 Age / Sex: 73 y.o., male   PCP: Myrl Askew, DO         Medical Service: Internal Medicine Teaching Service         Attending Physician: Dr. Bevelyn Bryant, MD      First Contact:   Cody Das, MS-3    Second Contact: Jose Ngo, MD        Pager:  [phone]  Third Contact: Adria Hopkins, MD    Pager:  [phone]       After Hours  (After 5pm / First Contact Pager: [phone]  weekends / holidays): Second Contact Pager: [phone]   SUBJECTIVE   Chief Complaint: Left arm pain and numbness   History of Present Illness: Phillip Booth is a 73 y.o. male with PMH of DVT, HTN, HLD, CAD, cerebral infarction, paroxysmal Afib, non-ischemic cardiomyopathy, and symptomatic bradycardia s/p STJ CRTP who presented to the ED with left arm pain and numbness. At around 3 am this morning, patient endorsed left arm numbness. Patient states it began to hurt and ache, described the pain as a burning pain. Also endorses tingling and weakness. Patient also endorses pain in left arm on exertion along with dizziness. Pain is still present when left arm is used. Denies chest pain, SOB. Denies left leg numbness, weakness, tingling, or pain. Patient also noticed later this morning that he was having trouble formulating words. Patient thought about what he wanted to say, but could not get the words out. Endorsed confusion, but was able to comprehend things that were said to him. Denied headache, vision changes, or memory loss.    ED Course: In the ED, vitals were *** Labs significant for *** Imaging *** Received *** Consulted ***  Meds:  *** Current Meds  Medication Sig  . amLODipine  (NORVASC ) 10 MG tablet Take 1 tablet (10 mg total) by mouth daily.  . apixaban  (ELIQUIS ) 5 MG TABS tablet Take 1 tablet (5 mg total) by mouth 2 (two) times daily.  . aspirin  EC 81 MG tablet Take 81 mg by mouth  daily.  . carvedilol  (COREG ) 6.25 MG tablet TAKE 1 TABLET BY MOUTH TWICE DAILY WITH A MEAL (Patient taking differently: Take 6.25 mg by mouth 2 (two) times daily with a meal.)  . isosorbide  mononitrate (IMDUR ) 30 MG 24 hr tablet TAKE 1 TABLET (30 MG TOTAL) BY MOUTH DAILY. (Patient taking differently: Take 30 mg by mouth daily.)  . levocetirizine (XYZAL ) 5 MG tablet Take 5 mg by mouth daily.  . lisinopril  (ZESTRIL ) 40 MG tablet TAKE 1 TABLET (40 MG TOTAL) BY MOUTH DAILY.  . montelukast  (SINGULAIR ) 10 MG tablet Take 1 tablet (10 mg total) by mouth at bedtime.  . rosuvastatin  (CRESTOR ) 10 MG tablet Take 1 tablet (10 mg total) by mouth daily.  . sodium chloride  (OCEAN) 0.65 % SOLN nasal spray Place 1 spray into both nostrils as needed for congestion.  . tamsulosin  (FLOMAX ) 0.4 MG CAPS capsule Take 0.4 mg by mouth daily.    Past Medical History Past Medical History:  Diagnosis Date  . Allergy   . Anemia 06/22/2023  . CAD (coronary artery disease)    a. moderate by cath 03/2014  . Cataract   . Cervical strain 03/25/2020  . CVA (cerebral infarction)    a. diagnosis not clear  . Hyperlipidemia   . Hypertension   .  Non-ischemic cardiomyopathy (HCC)    a. EF 45% by echo 03/2014  . Paroxysmal atrial fibrillation (HCC) 05/01/2014   asymptomatic, documented on PPM interrogation,  chads2vasc score is at least 6.  He is contemplating anticoagulation  . Symptomatic bradycardia    a. s/p STJ CRTP implanted by Dr Nunzio Belch  . Vertigo     Past Surgical History Past Surgical History:  Procedure Laterality Date  . ABDOMINAL SURGERY    . APPENDECTOMY    . BI-VENTRICULAR PACEMAKER INSERTION N/A 03/19/2014   STJ CRTP implanted by Dr Nunzio Belch  . EYE SURGERY     Cataract removed from Right eye  . HERNIA REPAIR    . LEFT HEART CATHETERIZATION WITH CORONARY ANGIOGRAM N/A 03/19/2014   Procedure: LEFT HEART CATHETERIZATION WITH CORONARY ANGIOGRAM;  Surgeon: Millicent Ally, MD;  Location: [facility] CATH LAB;  Service:  Cardiovascular;  Laterality: N/A;  . PPM GENERATOR CHANGEOUT N/A 12/07/2021   Procedure: PPM GENERATOR CHANGEOUT;  Surgeon: Efraim Grange, MD;  Location: [facility] INVASIVE CV LAB;  Service: Cardiovascular;  Laterality: N/A;    Social:  Lives With: *** Occupation: *** Support: *** Level of Function: *** PCP: *** Substances: -Tobacco: *** -Alcohol: *** -Recreational Drug: ***  Family History: *** Family History  Problem Relation Age of Onset  . Hypertension Mother   . Cancer Mother        brain (possibly started in lung)  . Heart disease Mother   . Diabetes Mother   . Other Mother        cardiomegaly  . Hypertension Father   . Emphysema Father   . Heart Problems Brother        pacemaker  . Heart Problems Other        using 10% of heart    Allergies: Allergies as of 06/22/2023 - Review Complete 06/22/2023  Allergen Reaction Noted  . Lactose intolerance (gi) Hives and Nausea And Vomiting 01/21/2016    Review of Systems: A complete ROS was negative except as per HPI.   OBJECTIVE:   Physical Exam: Blood pressure (!) 188/112, pulse 64, temperature (!) 97.4 F (36.3 C), temperature source Oral, resp. rate 18, height 6' 4 (1.93 m), weight 104.3 kg, SpO2 98%.  Constitutional: alert ***, well-appearing *** sitting in ***, in no acute distress HENT: normocephalic atraumatic, mucous membranes moist Eyes: conjunctiva non-erythematous Neck: supple Cardiovascular: regular rate and rhythm, no m/r/g Pulmonary/Chest: normal work of breathing on room air, lungs clear to auscultation bilaterally Abdominal: bowel sounds ***, soft, non-tender, non-distended MSK: normal bulk and tone Neurological: alert & oriented x 3, 5/5 strength in bilateral upper and lower extremities, normal gait Skin: warm and dry Psych: ***  Labs: CBC    Component Value Date/Time   WBC 4.3 06/22/2023 0836   RBC 4.07 (L) 06/22/2023 0836   HGB 12.6 (L) 06/22/2023 0836   HGB 13.9 11/07/2021 1244    HGB 14.3 09/09/2007 1502   HCT 38.2 (L) 06/22/2023 0836   HCT 42.4 11/07/2021 1244   HCT 40.6 09/09/2007 1502   PLT 156 06/22/2023 0836   PLT 177 11/07/2021 1244   MCV 93.9 06/22/2023 0836   MCV 93 11/07/2021 1244   MCV 89 09/09/2007 1502   MCH 31.0 06/22/2023 0836   MCHC 33.0 06/22/2023 0836   RDW 13.7 06/22/2023 0836   RDW 13.0 11/07/2021 1244   RDW 11.6 09/09/2007 1502   LYMPHSABS 2.0 06/22/2023 0836   LYMPHSABS 2.1 09/09/2007 1502   MONOABS 0.5 06/22/2023 0836  EOSABS 0.1 06/22/2023 0836   EOSABS 0.1 09/09/2007 1502   BASOSABS 0.0 06/22/2023 0836   BASOSABS 0.0 09/09/2007 1502     CMP     Component Value Date/Time   NA 141 06/22/2023 0836   NA 137 11/07/2021 1244   K 4.0 06/22/2023 0836   CL 108 06/22/2023 0836   CO2 22 06/22/2023 0836   GLUCOSE 100 (H) 06/22/2023 0836   BUN 19 06/22/2023 0836   BUN 14 11/07/2021 1244   CREATININE 0.84 06/22/2023 0836   CREATININE 1.06 09/23/2015 1322   CALCIUM  9.2 06/22/2023 0836   PROT 6.5 06/22/2023 0836   ALBUMIN 3.8 06/22/2023 0836   AST 23 06/22/2023 0836   ALT 14 06/22/2023 0836   ALKPHOS 68 06/22/2023 0836   BILITOT 1.2 06/22/2023 0836   GFRNONAA >60 06/22/2023 0836   GFRAA >60 07/22/2019 0914    ***  Imaging:  DG Humerus Left CLINICAL DATA:  Shoulder pain  EXAM: LEFT HUMERUS - 2+ VIEW  COMPARISON:  None Available.  FINDINGS: There is no evidence of fracture or other focal bone lesions. Soft tissues are unremarkable.  IMPRESSION: Negative.  Electronically Signed   By: Esmeralda Hedge M.D.   On: 06/22/2023 22:08 DG Shoulder Left CLINICAL DATA:  Pole Ng sensation left arm  EXAM: LEFT SHOULDER - 2+ VIEW  COMPARISON:  None Available.  FINDINGS: No fracture or malalignment. Moderate AC joint and glenohumeral degenerative change. Left-sided pacing device  IMPRESSION: Moderate degenerative changes.  Electronically Signed   By: Esmeralda Hedge M.D.   On: 06/22/2023 22:08 MR BRAIN WO  CONTRAST CLINICAL DATA:  Neuro deficit, acute, stroke suspected. Numbness and speech difficulty.  EXAM: MRI HEAD WITHOUT CONTRAST  TECHNIQUE: Multiplanar, multiecho pulse sequences of the brain and surrounding structures were obtained without intravenous contrast.  COMPARISON:  Head CT 06/22/2023  FINDINGS: Brain: Clustered small foci of mildly restricted diffusion involving posterior right frontal cortex are compatible with acute to early subacute infarcts. A similar subcentimeter focus of mild diffusion weighted signal abnormality is also present in the right cerebellar hemisphere.  There is a small chronic infarct in the medial right occipital lobe with associated chronic blood products. Patchy T2 hyperintensities throughout the cerebral white matter bilaterally are nonspecific but compatible with moderate chronic small vessel ischemic disease. Small chronic infarcts are present in the left thalamus and both cerebellar hemispheres. A remote insult is also noted in the body of the corpus callosum. Mild cerebral atrophy is within normal limits for age. The ventricles are normal in size. No mass, midline shift, or extra-axial fluid collection is identified.  Vascular: Major intracranial vascular flow voids are preserved.  Skull and upper cervical spine: Unremarkable bone marrow signal.  Sinuses/Orbits: Bilateral cataract extraction. Mild mucosal thickening in the ethmoid sinuses. Clear mastoid air cells.  Other: None.  IMPRESSION: 1. Small acute to early subacute right frontal and right cerebellar infarcts. 2. Moderate chronic small vessel ischemic disease. 3. Multiple chronic infarcts in the posterior circulation.  Electronically Signed   By: Aundra Lee M.D.   On: 06/22/2023 16:59 CT HEAD WO CONTRAST CLINICAL DATA:  Neuro deficit, concern for stroke, tingling/pulling sensation in left arm last night.  EXAM: CT HEAD WITHOUT CONTRAST  TECHNIQUE: Contiguous  axial images were obtained from the base of the skull through the vertex without intravenous contrast.  RADIATION DOSE REDUCTION: This exam was performed according to the departmental dose-optimization program which includes automated exposure control, adjustment of the mA and/or kV according to  patient size and/or use of iterative reconstruction technique.  COMPARISON:  CTA head and neck 05/03/2023.  FINDINGS: Brain: No acute intracranial hemorrhage. Interval evolution of previously noted infarct in the medial right occipital lobe now with chronic appearance. Additional chronic infarct in the posterosuperior right cerebellum. Nonspecific hypoattenuation in the periventricular and subcortical white matter favored to reflect chronic microvascular ischemic changes. No edema, mass effect, or midline shift. Basilar cisterns are patent. No extra-axial fluid collection.  Ventricles: The ventricles are normal.  Vascular: Atherosclerotic calcifications of the carotid siphons and intracranial vertebral arteries. No hyperdense vessel.  Skull: No acute or aggressive finding.  Orbits: Bilateral lens replacement.  Sinuses: Mild mucosal thickening in the ethmoid and maxillary sinuses.  Other: Mastoid air cells are clear.  IMPRESSION: No CT evidence of acute intracranial abnormality.  Expected interval evolution of now chronic infarct in the medial right occipital lobe.  Additional chronic infarct in the posterior right cerebellum.  Chronic microvascular ischemic changes.  Electronically Signed   By: Denny Flack M.D.   On: 06/22/2023 09:38   EKG: personally reviewed my interpretation is***. Prior EKG***  ASSESSMENT & PLAN:   Assessment & Plan by Problem: Principal Problem:   Acute cerebral infarction (HCC) Active Problems:   Second degree heart block   History of urinary tract obstruction   Essential hypertension   Atrial fibrillation (HCC)   Financial difficulties    Anemia   Pain in joint, shoulder region   Phillip Booth is a 73 y.o. person living with a history of *** who presented with *** and admitted for *** on hospital day 0  *** ***  *** ***  *** ***  Diet: {NAMES:3044014::Normal,Heart Healthy,Carb-Modified,Renal,Carb/Renal,NPO,TPN,Tube Feeds} VTE: {NAMES:3044014::Heparin ,Enoxaparin,SCDs,DOAC,None} IVF: {NAMES:3044014::None,NS,1/2 NS,LR,D5,D10},{NAMES:3044014::None,10cc/hr,25cc/hr,50cc/hr,75cc/hr,100cc/hr,110cc/hr,125cc/hr,Bolus} Code: {NAMES:3044014::Full,DNR,DNI,DNR/DNI,Comfort Care,Unknown} Surrogate Decision Maker: ***  Prior to Admission Living Arrangement: {NAMES:3044014::Home, living ***,SNF, ***,Homeless,***} Anticipated Discharge Location: {NAMES:3044014::Home,SNF,CIR,***} Barriers to Discharge: ***  Dispo: Admit patient to {STATUS:3044014::Observation with expected length of stay less than 2 midnights.,Inpatient with expected length of stay greater than 2 midnights.}  Signed: Oliva Beth, Medical Student Medical Student, MS3 06/22/2023, 11:10 PM   Please contact IM Residency On-Call Pager at: [phone] or [phone].

## 2023-06-22 NOTE — ED Notes (Signed)
 Patient transported to MRI

## 2023-06-22 NOTE — ED Triage Notes (Signed)
 Pt states that during the night last night while sleeping he had a tingling/pulling sensation in his left arm. Hx of stroke. EDP called to bedside. GCS 15. No chest pain or SOB.

## 2023-06-22 NOTE — ED Provider Notes (Signed)
 [facility] Provider Note   CSN: [id number] Arrival date & time: 06/22/23  2952     Patient presents with: arm numbness   Phillip Booth is a 73 y.o. male.   73 yo M with a chief complaints of left arm weakness.  He said it felt numb when he woke up about 3:30 AM.  He felt he was weak and was having trouble moving it as well.  He also felt like maybe it was strained.  He has trouble putting words to how it feels.  Still feels a little bit off now.  He denies any head or neck pain.  Denies injury to the arm.  Has never had anything like this before.  He otherwise has been doing well.  Last night went to bed and felt fine.        Prior to Admission medications   Medication Sig Start Date End Date Taking? Authorizing Provider  amLODipine  (NORVASC ) 10 MG tablet Take 1 tablet (10 mg total) by mouth daily. 03/17/19 06/14/22  Berlin Breen., NP  carvedilol  (COREG ) 6.25 MG tablet TAKE 1 TABLET BY MOUTH TWICE DAILY WITH A MEAL Patient taking differently: Take 6.25 mg by mouth 2 (two) times daily with a meal. 01/01/19   Saguier, Gaylin Ke, PA-C  fluticasone  (FLOVENT  HFA) 110 MCG/ACT inhaler Inhale 1 puff into the lungs daily as needed (Asthma). 07/03/19   [provider]  furosemide  (LASIX ) 20 MG tablet Take 20 mg by mouth daily. 05/22/18   [provider]  isosorbide  mononitrate (IMDUR ) 30 MG 24 hr tablet TAKE 1 TABLET (30 MG TOTAL) BY MOUTH DAILY. Patient taking differently: Take 30 mg by mouth daily. 02/26/19   Tylene Galla, PA-C  lisinopril  (ZESTRIL ) 40 MG tablet TAKE 1 TABLET (40 MG TOTAL) BY MOUTH DAILY. 11/28/18   Saguier, Gaylin Ke, PA-C  montelukast  (SINGULAIR ) 10 MG tablet Take 1 tablet (10 mg total) by mouth at bedtime. 08/20/18   Saguier, Gaylin Ke, PA-C  rosuvastatin  (CRESTOR ) 10 MG tablet Take 1 tablet (10 mg total) by mouth daily. 04/18/18   Saguier, Gaylin Ke, PA-C  sodium chloride  (OCEAN) 0.65 % SOLN nasal spray Place 1  spray into both nostrils as needed for congestion.    [provider]  tamsulosin  (FLOMAX ) 0.4 MG CAPS capsule Take 0.4 mg by mouth daily. 11/13/21   [provider]    Allergies: Lactose intolerance (gi)    Review of Systems  Updated Vital Signs BP 138/85   Pulse (!) 59   Temp 98 F (36.7 C) (Oral)   Resp 20   Ht 6' 4 (1.93 m)   Wt 104.3 kg   SpO2 98%   BMI 28.00 kg/m   Physical Exam Vitals and nursing note reviewed.  Constitutional:      Appearance: He is well-developed.  HENT:     Head: Normocephalic and atraumatic.   Eyes:     Pupils: Pupils are equal, round, and reactive to light.   Neck:     Vascular: No JVD.   Cardiovascular:     Rate and Rhythm: Normal rate and regular rhythm.     Heart sounds: No murmur heard.    No friction rub. No gallop.  Pulmonary:     Effort: No respiratory distress.     Breath sounds: No wheezing.  Abdominal:     General: There is no distension.     Tenderness: There is no abdominal tenderness. There is no guarding or rebound.  Musculoskeletal:        General: Normal range of motion.     Cervical back: Normal range of motion and neck supple.   Skin:    Coloration: Skin is not pale.     Findings: No rash.   Neurological:     Mental Status: He is alert and oriented to person, place, and time.     GCS: GCS eye subscore is 4. GCS verbal subscore is 5. GCS motor subscore is 6.     Cranial Nerves: Cranial nerves 2-12 are intact.     Sensory: Sensation is intact.     Motor: Motor function is intact.     Coordination: Coordination is intact.     Comments: I do not appreciate any obvious weakness of the left upper extremity.  Neuroexam is benign.  Pulse motor and sensation are intact in bilateral upper extremities.  Psychiatric:        Behavior: Behavior normal.     (all labs ordered are listed, but only abnormal results are displayed) Labs Reviewed  CBC - Abnormal; Notable for the following components:       Result Value   RBC 4.07 (*)    Hemoglobin 12.6 (*)    HCT 38.2 (*)    All other components within normal limits  COMPREHENSIVE METABOLIC PANEL WITH GFR - Abnormal; Notable for the following components:   Glucose, Bld 100 (*)    All other components within normal limits  CBG MONITORING, ED - Abnormal; Notable for the following components:   Glucose-Capillary 103 (*)    All other components within normal limits  ETHANOL  DIFFERENTIAL  URINE DRUG SCREEN  URINALYSIS, ROUTINE W REFLEX MICROSCOPIC  PROTIME-INR  APTT    EKG: EKG Interpretation Date/Time:  Friday June 22 2023 08:34:28 EDT Ventricular Rate:  60 PR Interval:  180 QRS Duration:  169 QT Interval:  470 QTC Calculation: 470 R Axis:   -73  Text Interpretation: Sinus rhythm Consider left atrial enlargement LVH with IVCD, LAD and secondary repol abnrm No significant change since last tracing Confirmed by Albertus Hughs [phone]) on 06/22/2023 9:06:01 AM  Radiology: CT HEAD WO CONTRAST Result Date: 06/22/2023 CLINICAL DATA:  Neuro deficit, concern for stroke, tingling/pulling sensation in left arm last night. EXAM: CT HEAD WITHOUT CONTRAST TECHNIQUE: Contiguous axial images were obtained from the base of the skull through the vertex without intravenous contrast. RADIATION DOSE REDUCTION: This exam was performed according to the departmental dose-optimization program which includes automated exposure control, adjustment of the mA and/or kV according to patient size and/or use of iterative reconstruction technique. COMPARISON:  CTA head and neck 05/03/2023. FINDINGS: Brain: No acute intracranial hemorrhage. Interval evolution of previously noted infarct in the medial right occipital lobe now with chronic appearance. Additional chronic infarct in the posterosuperior right cerebellum. Nonspecific hypoattenuation in the periventricular and subcortical white matter favored to reflect chronic microvascular ischemic changes. No edema, mass effect,  or midline shift. Basilar cisterns are patent. No extra-axial fluid collection. Ventricles: The ventricles are normal. Vascular: Atherosclerotic calcifications of the carotid siphons and intracranial vertebral arteries. No hyperdense vessel. Skull: No acute or aggressive finding. Orbits: Bilateral lens replacement. Sinuses: Mild mucosal thickening in the ethmoid and maxillary sinuses. Other: Mastoid air cells are clear. IMPRESSION: No CT evidence of acute intracranial abnormality. Expected interval evolution of now chronic infarct in the medial right occipital lobe. Additional chronic infarct in the posterior right cerebellum. Chronic microvascular ischemic changes. Electronically Signed   By: Denny Flack  M.D.   On: 06/22/2023 09:38     Procedures   Medications Ordered in the ED - No data to display                                  Medical Decision Making Amount and/or Complexity of Data Reviewed Labs: ordered. Radiology: ordered.   73 yo M with a chief complaints of left arm numbness and weakness.  He woke up with this about 3:30 in the morning.  He does feel like it is better now but feels like it still feels like its strained maybe.  He does have some symptoms with passive range of motion of that arm.  I do not appreciate any weakness now.  Will obtain CT imaging of the head.  Blood work.  Discussed with neurology.  I discussed case with Dr. Alecia Ames he reviewed the patient's chart and independently reviewed the patient's CT head.  Based on my history he thought the patient would benefit from acute MRI imaging.  If positive he recommended admission for stroke.  If negative with a patient having a history of embolic stroke in the past as well as having history of atrial fibrillation thought the patient would benefit from being anticoagulated with a DOAC.  I discussed this with the patient.  He told me he had been diagnosed with a DVT in the past but its only did what sounds like the  starter pack and then stopped taking the medication.  He is amenable to being started back on a anticoagulant.  I discussed case with Dr. Charlee Conine, accept the patient in ED to ED transfer.  The patients results and plan were reviewed and discussed.   Any x-rays performed were independently reviewed by myself.   Differential diagnosis were considered with the presenting HPI.  Medications - No data to display  Vitals:   06/22/23 0830 06/22/23 0832 06/22/23 0900 06/22/23 0930  BP:  (!) 159/89 130/81 138/85  Pulse:  60 60 (!) 59  Resp:  (!) 24 (!) 24 20  Temp:  98 F (36.7 C)    TempSrc:  Oral    SpO2:  97% 98%   Weight: 104.3 kg     Height: 6' 4 (1.93 m)       Final diagnoses:  Left arm weakness        Final diagnoses:  Left arm weakness    ED Discharge Orders     None          Albertus Hughs, DO 06/22/23 1025

## 2023-06-22 NOTE — ED Notes (Signed)
 Transported to MRI

## 2023-06-22 NOTE — ED Notes (Signed)
 This RN reached out to Dr.McLendon regarding pt BP being elevated. He reported their team is about to come see him for admission and would place orders then. No orders given to this RN at this time

## 2023-06-22 NOTE — ED Notes (Signed)
 Attempted IV access to left AC and FA, tol well, blood obtained for labs

## 2023-06-22 NOTE — ED Notes (Signed)
 This RN reviewed admission with pt and family at bedside. No further complaints or questions. Pt ambulated to lobby to travel POV to [facility] ED for MRI.

## 2023-06-22 NOTE — ED Notes (Signed)
 Patient transported to CT

## 2023-06-22 NOTE — Progress Notes (Signed)
 Ordered EKG completed- results hand delivered to Dr. Inga Manges at approximately [phone]. RN aware.

## 2023-06-22 NOTE — Hospital Course (Addendum)
 Principal Problem:   Acute cerebral infarction [facility]) Active Problems:   Essential hypertension   Atrial fibrillation (HCC)   Second degree heart block   History of urinary tract obstruction   Financial difficulties   Anemia   Pain in joint, shoulder region  Resolved Problems:   * No resolved hospital problems. *  Consults:***  Procedures:***  Follow-up items:***  Realized around 3 am that he was having stroke like symptoms. He was waking up through the night, left arm was aching, burning, numb. Left the fan on which is unusual, thought he was cold so he wrapped up and tried to go back to sleep. Woke up around 7 am to go to religious service but on the way there with his ride he realized he couldn't formulate a sentence. He could understand speech just fine. *Good clarifying question* He also notes that he can't really use his arm because of pain and weakness. *Good clarifying question* Arm symptoms persist. No leg problems. Reports decreased finger dexterity. Had some dizziness.   History notable for paroxysmal atrial fibrillation, A/V pacing due to history of heart block, prior cerebral infarct based on imaging, hypertension, vertigo, high cholesterol, DVT with PE.  Nonadherent to Eliquis  due to cost.  Family history of stroke in brothers and ?father or grandfather, mother and brother with diabetes.  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 Nonsmoker, drinks very rarely, kind of a square.  Review of systems positive for left arm tingling, confusion, generalized aches. Negative for headache, chest pain.  Principal Problem:   Acute cerebral infarction (HCC) Stable.  Small right frontal and right cerebellar infarcts with acute  appearance on MRI.  Really no objective deficits on exam now.  Still with subjective alteration to sensorium and pain in the left arm.  Risk factors include atrial fibrillation not on anticoagulation.  Also hypertension, adherent to antihypertensives.  Finally, hyperlipidemia on statin.  Also prior stroke.  CTA head and neck pending.  Echocardiogram pending.  Lower extremity venous duplex.  Admit to med telemetry for overnight monitoring.  PT and OT in the morning but I am hopeful he will not have significant therapy needs.  Restart Eliquis , see if we can get him some assistance with this medicine outside of the hospital.  Continue outpatient aspirin  and statin.  Work on blood pressure slowly, meds for that per below.  Active Problems:   Essential hypertension Hypertensive to 188-220 systolic in the emergency department.  He takes several agents outside of the hospital.  I am going to selectively restart some tonight, including carvedilol  6.25 mg twice daily, Imdur  30 mg daily.  Tomorrow, restart amlodipine  10 mg, and lisinopril  40 mg.    Atrial fibrillation (HCC) Low burden of A-fib per prior device interrogation, <1%.  He is in AV paced rhythm.  Unfortunately cannot afford Eliquis  because he has been out of work caring for his wife for a while.  Will restart Eliquis  while hospitalized.    Second degree heart block With AV pacemaker.    History of urinary tract obstruction Chronic and stable, continue outpatient tamsulosin .    Financial difficulties Unfortunately had to leave work to care for his ailing wife.  As such he has to be  selective about the medicines he takes and because apixaban  so expensive this is one that he hasn't been taking.  Maybe we can get him some financial assistance with this.    Anemia Mild, normocytic.  Chronic and stable.  Remote history of colonoscopy without malignancy.  If he is iron deficient he is someone I would recommend diagnostic colonoscopy for.  Check  ferritin.    Shoulder pain Kind of atypical for stroke.  Has some exertional qualities.  His troponin is marginally elevated at 40.  I do not think this is ACS.  This could be from stroke but I am also going to get an x-ray of the shoulder and his humerus.  I also wonder about subclavian steal syndrome with his reported history of vertigo.   Hx of embolic stroke, afib, PE and DVT on the right popliteal (06/2022) s/p 3 months of eliquis *** in the setting of COVID-19. Not on eliquis . Left arm weakness and tingling. Last known normal 3:30AM***  symptomatic bradycardia due to sinus node dysfunction/AV block status post pacemaker placement  Presented with dizziness in April. CT negative for stroke. Incidental finding of a fracture on the hand.   No leukocytosis  Anemia (mild)  No electrolyte derrangements   Few bacteria Urine, no nitrites no leukocytes   CT IMPRESSION:  IMPRESSION: No CT evidence of acute intracranial abnormality.   Expected interval evolution of now chronic infarct in the medial right occipital lobe.   Additional chronic infarct in the posterior right cerebellum.   Chronic microvascular ischemic changes.  Neurology was consulted and they recommended MRI- came to ED.  MRI : 1. Small acute to early subacute right frontal and right cerebellar infarcts. 2. Moderate chronic small vessel ischemic disease. 3. Multiple chronic infarcts in the posterior circulation.  Was requesting to go AMA. Was started on Eliquis      ASA CT Angio Head and neck  VAS US  Lower extremity  PT/OT   SLP eval  Echo  A1c Lipid panel    Arm pain started about two weeks ago, in Left lateral arm. Aching type of pain. Went to bed and had a fan on woke up and was really cold, arm  was inoperative? Couldn't feel anything. Got up, walked around, did things to try and get the arm warmed up. Couldn't get the feeling. Has a hx of clots. No changes in medication. On eliquis  for three months

## 2023-06-22 NOTE — ED Provider Notes (Signed)
  Physical Exam  BP (!) 101/37 (BP Location: Left Arm)   Pulse 60   Temp 97.8 F (36.6 C) (Oral)   Resp (!) 28   Ht 6' 4 (1.93 m)   Wt 104.3 kg   SpO2 99%   BMI 28.00 kg/m   Physical Exam  Procedures  Procedures  ED Course / MDM   Clinical Course as of 06/22/23 1808  Fri Jun 22, 2023  1455 Assumed care.  73 year old male history of CHF and atrial fibrillation not on anticoagulation who had numbness of his left upper extremity and difficulty with speech upon waking.  Initially seen in outside facility and referred over for MRI. If MRI negative can potentially be started on a DOAC and dc. But will need to talk to neuro.  [RP]  1756 Patient requesting leave AGAINST MEDICAL ADVICE.  I did inform him that his MRI does show an acute stroke that likely cause his symptoms.  He expressed that he understands that he needs to go home to attend to some matters.  I did explain to him that he could suffer a larger stroke and he permanently disabled and unable to move or speak.  I did explain to him that these can also sometimes be fatal and that this could be prevented that he comes into the hospital potentially.  He is still requesting to go at this time.  Will start him on Eliquis  and have him follow-up with neurology as an outpatient.  Did attempt to contact neurology prior to patient leaving but he is requesting to leave immediately.  Did discuss anticoagulation with him and he says that he has not had any bleeding in the past and that there were no safety concerns when he is on the Xarelto previously. [RP]  1808 Dr Jillene Motley from internal medicine teaching service to admit the patient.  [RP]    Clinical Course User Index [RP] Ninetta Basket, MD   Medical Decision Making Amount and/or Complexity of Data Reviewed Labs: ordered. Radiology: ordered.  Risk Prescription drug management.      Ninetta Basket, MD 06/22/23 1758

## 2023-06-22 NOTE — H&P (Incomplete)
 Date: 06/22/2023               Patient Name:  Phillip Booth MRN: [id number]  DOB: [birth year] Age / Sex: 73 y.o., male   PCP: Myrl Askew, DO         Medical Service: Internal Medicine Teaching Service         Attending Physician: Dr. Bevelyn Bryant, MD      First Contact:   Cody Das, MS-3    Second Contact: Jose Ngo, MD        Pager:  [phone]  Third Contact: Adria Hopkins, MD    Pager:  [phone]       After Hours  (After 5pm / First Contact Pager: [phone]  weekends / holidays): Second Contact Pager: [phone]   SUBJECTIVE   Chief Complaint: Left arm pain and numbness   History of Present Illness: Kevan Prouty is a 73 y.o. male with PMH of DVT, HTN, HLD, CAD, cerebral infarction, paroxysmal Afib, non-ischemic cardiomyopathy, and symptomatic bradycardia s/p STJ CRTP who presented to the ED with left arm pain and numbness. At around 3 am this morning, patient endorsed left arm numbness. At first, he thought that it could have been a strained muscle or from fan that was blowing on the side of the bed. Patient states it began to hurt and ache, described the pain as a burning pain. Also endorses tingling and weakness. Patient also endorses pain in left arm on exertion along with dizziness. Pain and weakness is still present when left arm is used. Reports decreased finger dexterity. Denies any trauma to left upper extremity. Denies chest pain, SOB. Denies left leg numbness, weakness, tingling, or pain. Patient also noticed later this morning that he was having trouble formulating words. Patient thought about what he wanted to say, but could not get the words out. Endorsed confusion, but was able to comprehend things that were said to him. Denied headache, vision changes, or memory loss.    ED Course: In the ED, vitals were afebrile (36.7), pulse 60, tachypnea 24, hypertensive (159/89), O2 sat 97% room air. Labs significant for anemia- heme 12.6, proteinuria,  few bacteria seen on UA. CT head without contrast revealed chronic infarct in the medial right occipital lobe. Additional chronic infarct in the posterior right cerebellum. Chronic microvascular ischemic changes. MRI head without contrast revealed small acute to early subacute right frontal and right cerebellar Infarcts and multiple chronic infarcts in the posterior circulation. Neuro consulted.  Meds:  Current Meds  Medication Sig   amLODipine  (NORVASC ) 10 MG tablet Take 1 tablet (10 mg total) by mouth daily.   apixaban  (ELIQUIS ) 5 MG TABS tablet Take 1 tablet (5 mg total) by mouth 2 (two) times daily.   aspirin  EC 81 MG tablet Take 81 mg by mouth daily.   carvedilol  (COREG ) 6.25 MG tablet TAKE 1 TABLET BY MOUTH TWICE DAILY WITH A MEAL (Patient taking differently: Take 6.25 mg by mouth 2 (two) times daily with a meal.)   isosorbide  mononitrate (IMDUR ) 30 MG 24 hr tablet TAKE 1 TABLET (30 MG TOTAL) BY MOUTH DAILY. (Patient taking differently: Take 30 mg by mouth daily.)   levocetirizine (XYZAL ) 5 MG tablet Take 5 mg by mouth daily.   lisinopril  (ZESTRIL ) 40 MG tablet TAKE 1 TABLET (40 MG TOTAL) BY MOUTH DAILY.   montelukast  (SINGULAIR ) 10 MG tablet Take 1 tablet (10 mg total) by mouth at bedtime.   rosuvastatin  (CRESTOR ) 10 MG tablet Take 1 tablet (10 mg total) by  mouth daily.   sodium chloride  (OCEAN) 0.65 % SOLN nasal spray Place 1 spray into both nostrils as needed for congestion.   tamsulosin  (FLOMAX ) 0.4 MG CAPS capsule Take 0.4 mg by mouth daily.  **Has not been able to take Eliquis  due to cost   Past Medical History Past Medical History:  Diagnosis Date   Allergy    Anemia 06/22/2023   CAD (coronary artery disease)    a. moderate by cath 03/2014   Cataract    Cervical strain 03/25/2020   CVA (cerebral infarction)    a. diagnosis not clear   Hyperlipidemia    Hypertension    Non-ischemic cardiomyopathy (HCC)    a. EF 45% by echo 03/2014   Paroxysmal atrial fibrillation (HCC)  05/01/2014   asymptomatic, documented on PPM interrogation,  chads2vasc score is at least 6.  He is contemplating anticoagulation   Symptomatic bradycardia    a. s/p STJ CRTP implanted by Dr Nunzio Belch   Vertigo     Past Surgical History Past Surgical History:  Procedure Laterality Date   ABDOMINAL SURGERY     APPENDECTOMY     BI-VENTRICULAR PACEMAKER INSERTION N/A 03/19/2014   STJ CRTP implanted by Dr Nunzio Belch   EYE SURGERY     Cataract removed from Right eye   HERNIA REPAIR     LEFT HEART CATHETERIZATION WITH CORONARY ANGIOGRAM N/A 03/19/2014   Procedure: LEFT HEART CATHETERIZATION WITH CORONARY ANGIOGRAM;  Surgeon: Millicent Ally, MD;  Location: [facility] CATH LAB;  Service: Cardiovascular;  Laterality: N/A;   PPM GENERATOR CHANGEOUT N/A 12/07/2021   Procedure: PPM GENERATOR CHANGEOUT;  Surgeon: Arlester Ladd, Donnamae Gaba, MD;  Location: [facility] INVASIVE CV LAB;  Service: Cardiovascular;  Laterality: N/A;    Social:  Lives With: friend  Occupation: Was a Midwife, 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 Patient had to quit job to take care of his wife, which made it more difficult to afford his medications such as Eliquis . Patient used to live with his wife but wife is now at a care facility. Patient stated that he probably has not been eating as much as he used to due to his wife not living with him, and has had a decreased level of support. It seems that this situation with his wife has understandably taken an emotional toll on him as well.   Family History:  Family History  Problem Relation Age of Onset   Hypertension Mother    Cancer Mother        brain (possibly started in  lung)   Heart disease Mother    Diabetes Mother    Other Mother        cardiomegaly   Hypertension Father    Emphysema Father    Heart Problems Brother        pacemaker   Heart Problems Other        using 10% of heart  - Stroke in grandfather, deceased from stroke - Stroke in both brothers  - DVT in upper extremity of brother - Diabetes in mother  - Diabetes in oldest brother - HTN in multiple family members   Allergies: Allergies as of 06/22/2023 -  Review Complete 06/22/2023  Allergen Reaction Noted   Lactose intolerance (gi) Hives and Nausea And Vomiting 01/21/2016    Review of Systems: A complete ROS was negative except as per HPI.   OBJECTIVE:   Physical Exam: Blood pressure (!) 188/112, pulse 64, temperature (!) 97.4 F (36.3 C), temperature source Oral, resp. rate 18, height 6' 4 (1.93 m), weight 104.3 kg, SpO2 98%.  Constitutional: alert and oriented, well-appearing laying in bed in no acute distress Eyes: conjunctiva non-erythematous Cardiovascular: regular rate and rhythm, no m/r/g Pulmonary/Chest: normal work of breathing on room air, lungs clear to auscultation bilaterally Abdominal: non-distended MSK: tender to palpation along left central trapezoid, no tenderness to palpation on anterior or posterior glenohumeral joint. Ability to abduct arms 180 degrees bilaterally  Neurological: alert & oriented x 3, PERRLA, EOMI, sensation intact bilaterally on face, hearing grossly intact, no tongue deviation, palate elevates symmetrically, 5/5  motor strength in upper and lower extremities. pain elicited with strength testing of left upper extremity, negative pronator drift, Normal FTN test, normal heel to shin bilaterally, normal diadochokinesia bilaterally Psych: pleasant   Labs: CBC    Component Value Date/Time   WBC 4.3 06/22/2023 0836   RBC 4.07 (L) 06/22/2023 0836   HGB 12.6 (L) 06/22/2023 0836   HGB 13.9 11/07/2021 1244   HGB 14.3 09/09/2007 1502   HCT  38.2 (L) 06/22/2023 0836   HCT 42.4 11/07/2021 1244   HCT 40.6 09/09/2007 1502   PLT 156 06/22/2023 0836   PLT 177 11/07/2021 1244   MCV 93.9 06/22/2023 0836   MCV 93 11/07/2021 1244   MCV 89 09/09/2007 1502   MCH 31.0 06/22/2023 0836   MCHC 33.0 06/22/2023 0836   RDW 13.7 06/22/2023 0836   RDW 13.0 11/07/2021 1244   RDW 11.6 09/09/2007 1502   LYMPHSABS 2.0 06/22/2023 0836   LYMPHSABS 2.1 09/09/2007 1502   MONOABS 0.5 06/22/2023 0836   EOSABS 0.1 06/22/2023 0836   EOSABS 0.1 09/09/2007 1502   BASOSABS 0.0 06/22/2023 0836   BASOSABS 0.0 09/09/2007 1502     CMP     Component Value Date/Time   NA 141 06/22/2023 0836   NA 137 11/07/2021 1244   K 4.0 06/22/2023 0836   CL 108 06/22/2023 0836   CO2 22 06/22/2023 0836   GLUCOSE 100 (H) 06/22/2023 0836   BUN 19 06/22/2023 0836   BUN 14 11/07/2021 1244   CREATININE 0.84 06/22/2023 0836   CREATININE 1.06 09/23/2015 1322   CALCIUM  9.2 06/22/2023 0836   PROT 6.5 06/22/2023 0836   ALBUMIN 3.8 06/22/2023 0836   AST 23 06/22/2023 0836   ALT 14 06/22/2023 0836   ALKPHOS 68 06/22/2023 0836   BILITOT 1.2 06/22/2023 0836   GFRNONAA >60 06/22/2023 0836   GFRAA >60 07/22/2019 0914      Imaging:  DG Humerus Left CLINICAL DATA:  Shoulder pain  EXAM: LEFT HUMERUS - 2+ VIEW  COMPARISON:  None Available.  FINDINGS: There is no evidence of fracture or other focal bone lesions. Soft tissues are unremarkable.  IMPRESSION: Negative.  Electronically Signed   By: Esmeralda Hedge M.D.   On: 06/22/2023 22:08 DG Shoulder Left CLINICAL DATA:  Pole Ng sensation left arm  EXAM: LEFT SHOULDER - 2+ VIEW  COMPARISON:  None Available.  FINDINGS: No fracture or malalignment. Moderate AC joint and glenohumeral degenerative change. Left-sided pacing device  IMPRESSION: Moderate degenerative changes.  Electronically Signed   By: Esmeralda Hedge M.D.   On: 06/22/2023 22:08  MR BRAIN WO CONTRAST CLINICAL DATA:  Neuro deficit, acute,  stroke suspected. Numbness and speech difficulty.  EXAM: MRI HEAD WITHOUT CONTRAST  TECHNIQUE: Multiplanar, multiecho pulse sequences of the brain and surrounding structures were obtained without intravenous contrast.  COMPARISON:  Head CT 06/22/2023  FINDINGS: Brain: Clustered small foci of mildly restricted diffusion involving posterior right frontal cortex are compatible with acute to early subacute infarcts. A similar subcentimeter focus of mild diffusion weighted signal abnormality is also present in the right cerebellar hemisphere.  There is a small chronic infarct in the medial right occipital lobe with associated chronic blood products. Patchy T2 hyperintensities throughout the cerebral white matter bilaterally are nonspecific but compatible with moderate chronic small vessel ischemic disease. Small chronic infarcts are present in the left thalamus and both cerebellar hemispheres. A remote insult is also noted in the body of the corpus callosum. Mild cerebral atrophy is within normal limits for age. The ventricles are normal in size. No mass, midline shift, or extra-axial fluid collection is identified.  Vascular: Major intracranial vascular flow voids are preserved.  Skull and upper cervical spine: Unremarkable bone marrow signal.  Sinuses/Orbits: Bilateral cataract extraction. Mild mucosal thickening in the ethmoid sinuses. Clear mastoid air cells.  Other: None.  IMPRESSION: 1. Small acute to early subacute right frontal and right cerebellar infarcts. 2. Moderate chronic small vessel ischemic disease. 3. Multiple chronic infarcts in the posterior circulation.  Electronically Signed   By: Aundra Lee M.D.   On: 06/22/2023 16:59 CT HEAD WO CONTRAST CLINICAL DATA:  Neuro deficit, concern for stroke, tingling/pulling sensation in left arm last night.  EXAM: CT HEAD WITHOUT CONTRAST  TECHNIQUE: Contiguous axial images were obtained from the base of the  skull through the vertex without intravenous contrast.  RADIATION DOSE REDUCTION: This exam was performed according to the departmental dose-optimization program which includes automated exposure control, adjustment of the mA and/or kV according to patient size and/or use of iterative reconstruction technique.  COMPARISON:  CTA head and neck 05/03/2023.  FINDINGS: Brain: No acute intracranial hemorrhage. Interval evolution of previously noted infarct in the medial right occipital lobe now with chronic appearance. Additional chronic infarct in the posterosuperior right cerebellum. Nonspecific hypoattenuation in the periventricular and subcortical white matter favored to reflect chronic microvascular ischemic changes. No edema, mass effect, or midline shift. Basilar cisterns are patent. No extra-axial fluid collection.  Ventricles: The ventricles are normal.  Vascular: Atherosclerotic calcifications of the carotid siphons and intracranial vertebral arteries. No hyperdense vessel.  Skull: No acute or aggressive finding.  Orbits: Bilateral lens replacement.  Sinuses: Mild mucosal thickening in the ethmoid and maxillary sinuses.  Other: Mastoid air cells are clear.  IMPRESSION: No CT evidence of acute intracranial abnormality.  Expected interval evolution of now chronic infarct in the medial right occipital lobe.  Additional chronic infarct in the posterior right cerebellum.  Chronic microvascular ischemic changes.  Electronically Signed   By: Denny Flack M.D.   On: 06/22/2023 09:38   EKG: personally reviewed my interpretation is sinus rhythm with left atrial enlargement, LVH with IVCD, LAD . Prior EKG is the same.   ASSESSMENT & PLAN:   Assessment & Plan by Problem: Principal Problem:   Acute cerebral infarction [facility]) Active Problems:   Second degree heart block   History of urinary tract obstruction   Essential hypertension   Atrial fibrillation (HCC)    Financial difficulties   Anemia   Pain in joint, shoulder region   Bernerd Terhune is a  73 y.o. person living with a history of PMH of PE, DVT, HTN, HLD, CAD, cerebral infarction, paroxysmal Afib, non-ischemic cardiomyopathy, and symptomatic bradycardia s/p STJ CRTP who presented to the ED with left arm pain and numbness and admitted for acute cerebral infarction on hospital day 0. Patient is stable, but main problem is the pain and weakness in left arm.   Principal Problem:   Acute cerebral infarction (HCC) Stable.  Small right frontal and right cerebellar infarcts with acute appearance on MRI.  Really no objective deficits on exam now.  Still with subjective alteration to sensorium and pain in the left arm.  Risk factors include atrial fibrillation not on anticoagulation, HTN, and HLD.   Has a hx of a prior stroke in the right thalamus and IC in 2009. Seems to have some symptoms of subclavian steal with dizziness and left arm weakness that worsens with exertion. BP in the right is also higher than the left 163/95 > 109/50.    -Appreciate Neurology - CTA head and neck pending.   -Echocardiogram pending.   -Lower extremity venous duplex.  -ASA, Statin -Hgb A1c  -Lipid panel -Eliquis  - may need financial assistance for adherence outside of the hospital -Blood pressure control  -PT/OT   Active Problems:   Essential hypertension Hypertensive to 188-220 systolic in the emergency department. No symptoms from hypertension, no headache, vision changes.  -cw carvedilol  6.25 mg twice daily -cw Imdur  30 mg daily.   -Tomorrow, may be able to restart amlodipine  10 mg, and lisinopril  40 mg.    Atrial fibrillation (HCC) Low burden of A-fib per prior device interrogation, <1%.  He is in AV paced rhythm. Does not endorse lightheadedness or acute SOB, does endorse dizziness with getting up, but this seems to have come on with his left arm pain. Unfortunately cannot afford Eliquis  because he has been out  of work caring for his wife for a while.   -Restart Eliquis     Second degree heart block S/p AV pacemaker.    History of urinary tract obstruction Chronic and stable, continue outpatient tamsulosin .  Social Determinants of Health affecting Health Care Unfortunately had to leave work to care for his ailing wife.  As such he has to be selective about the medicines he takes and because apixaban  so expensive this is one that he hasn't been taking.  Maybe we can get him some financial assistance with this.    Normocytic Anemia Mild, normocytic.  Hemoglobin 12.6. Chronic and stable. Does endorse mild SOB, but says it is due to his allergies. Remote history of colonoscopy without malignancy.  If he is iron deficient he is someone I would recommend diagnostic colonoscopy for.  Check ferritin and iron labs.     Shoulder pain Kind of atypical for stroke. Tender to palpation at left trap and rear delt. Pain elicited when performing strength exam. Has some exertional qualities. His troponin is marginally elevated at 40.  I do not think this is ACS, no acute ischemic changes on EKG. Negative findings on xray of shoulder and humerus, no joint capsule pain on exam, no trauma.  I also wonder about subclavian steal syndrome with his reported history of vertigo as mentioned above. Has had history of left arm pain with dizziness before.  -CTA Head and Neck  Diet: Normal VTE: Eliquis  5 mg twice daily  IVF: none Code: Full  Prior to Admission Living Arrangement: Home Anticipated Discharge Location: Home Barriers to Discharge: Stroke workup   Dispo: Admit patient  to Inpatient with expected length of stay greater than 2 midnights.  Signed: Oliva Beth, Medical Student Medical Student, MS3 06/22/2023, 11:10 PM   I was personally present and re-performed the exam and medical decision making and verified the service and findings are accurately documented in the student's note.  Jose Ngo, MD 06/23/2023 2:19 AM   Please contact IM Residency On-Call Pager at: [phone] or [phone].

## 2023-06-22 NOTE — ED Notes (Signed)
 Water and cracker provided to pt.

## 2023-06-22 NOTE — Consult Note (Signed)
 NEUROLOGY CONSULT NOTE   Date of service: June 22, 2023 Patient Name: Phillip Booth MRN:  [id number] DOB:  [birth year] Chief Complaint: Stroke on MRI Requesting Provider: Ninetta Basket, MD  History of Present Illness  Phillip Booth is a 73 y.o. male with hx of CHF, s/p PPM, PE, DVT and Afib who initially presented with left upper extremity weakness and speech difficulties to [facility].  He initially noticed this when he woke up around 3:30 in the morning but he felt like he may be just strained a muscle or that it was from the fan that was blowing on that side of the bed.  He initially went to [facility] and was referred to [facility], ED for an MRI. MRI shows an acute right frontal and right cerebellar infarct. He is not currently on a DOAC or antiplatelet medications.  He did complete 3 months of eliquis  after he was diagnosed with a PE and a DVT in 2024.  He does have a pacemaker and has had documented paroxysmal atrial fibrillation in the past. He has noted mild swelling in his left leg. He denies pain in his arms or legs at this time.   LKW: 6/12 prior to sleep  Modified rankin score: 0-Completely asymptomatic and back to baseline post- stroke IV Thrombolysis: No, outside of window EVT: Symptoms not consistent with LVO    ROS  Comprehensive ROS performed and pertinent positives documented in HPI   Past History   Past Medical History:  Diagnosis Date   Allergy    CAD (coronary artery disease)    a. moderate by cath 03/2014   Cataract    Cervical strain 03/25/2020   CVA (cerebral infarction)    a. diagnosis not clear   Hyperlipidemia    Hypertension    Non-ischemic cardiomyopathy (HCC)    a. EF 45% by echo 03/2014   Paroxysmal atrial fibrillation (HCC) 05/01/2014   asymptomatic, documented on PPM interrogation,  chads2vasc score is at least 6.  He is contemplating anticoagulation   Symptomatic bradycardia    a. s/p STJ CRTP implanted by Dr Nunzio Belch    Vertigo     Past Surgical History:  Procedure Laterality Date   ABDOMINAL SURGERY     APPENDECTOMY     BI-VENTRICULAR PACEMAKER INSERTION N/A 03/19/2014   STJ CRTP implanted by Dr Nunzio Belch   EYE SURGERY     Cataract removed from Right eye   HERNIA REPAIR     LEFT HEART CATHETERIZATION WITH CORONARY ANGIOGRAM N/A 03/19/2014   Procedure: LEFT HEART CATHETERIZATION WITH CORONARY ANGIOGRAM;  Surgeon: Millicent Ally, MD;  Location: [facility] CATH LAB;  Service: Cardiovascular;  Laterality: N/A;   PPM GENERATOR CHANGEOUT N/A 12/07/2021   Procedure: PPM GENERATOR CHANGEOUT;  Surgeon: Arlester Ladd, Donnamae Gaba, MD;  Location: [facility] INVASIVE CV LAB;  Service: Cardiovascular;  Laterality: N/A;    Family History: Family History  Problem Relation Age of Onset   Hypertension Mother    Cancer Mother        brain (possibly started in lung)   Heart disease Mother    Diabetes Mother    Other Mother        cardiomegaly   Hypertension Father    Emphysema Father    Heart Problems Brother        pacemaker   Heart Problems Other        using 10% of heart    Social History  reports that he has never  smoked. He has never used smokeless tobacco. He reports that he does not currently use alcohol. He reports that he does not use drugs.  Allergies  Allergen Reactions   Lactose Intolerance (Gi) Hives and Nausea And Vomiting    Medications  No current facility-administered medications for this encounter.  Current Outpatient Medications:    amLODipine  (NORVASC ) 10 MG tablet, Take 1 tablet (10 mg total) by mouth daily., Disp: 90 tablet, Rfl: 3   apixaban  (ELIQUIS ) 5 MG TABS tablet, Take 1 tablet (5 mg total) by mouth 2 (two) times daily., Disp: 60 tablet, Rfl: 2   aspirin  EC 81 MG tablet, Take 81 mg by mouth daily., Disp: , Rfl:    carvedilol  (COREG ) 6.25 MG tablet, TAKE 1 TABLET BY MOUTH TWICE DAILY WITH A MEAL (Patient taking differently: Take 6.25 mg by mouth 2 (two) times daily with a meal.), Disp: 180 tablet,  Rfl: 0   isosorbide  mononitrate (IMDUR ) 30 MG 24 hr tablet, TAKE 1 TABLET (30 MG TOTAL) BY MOUTH DAILY. (Patient taking differently: Take 30 mg by mouth daily.), Disp: 30 tablet, Rfl: 5   levocetirizine (XYZAL ) 5 MG tablet, Take 5 mg by mouth daily., Disp: , Rfl:    lisinopril  (ZESTRIL ) 40 MG tablet, TAKE 1 TABLET (40 MG TOTAL) BY MOUTH DAILY., Disp: 30 tablet, Rfl: 3   montelukast  (SINGULAIR ) 10 MG tablet, Take 1 tablet (10 mg total) by mouth at bedtime., Disp: 30 tablet, Rfl: 3   rosuvastatin  (CRESTOR ) 10 MG tablet, Take 1 tablet (10 mg total) by mouth daily., Disp: 30 tablet, Rfl: 11   sodium chloride  (OCEAN) 0.65 % SOLN nasal spray, Place 1 spray into both nostrils as needed for congestion., Disp: , Rfl:    tamsulosin  (FLOMAX ) 0.4 MG CAPS capsule, Take 0.4 mg by mouth daily., Disp: , Rfl:    fluticasone  (FLOVENT  HFA) 110 MCG/ACT inhaler, Inhale 1 puff into the lungs daily as needed (Asthma)., Disp: , Rfl:   Vitals   Vitals:   06/22/23 0900 06/22/23 0930 06/22/23 1029 06/22/23 1255  BP: 130/81 138/85 119/78 (!) 101/37  Pulse: 60 (!) 59 60 60  Resp: (!) 24 20 19  (!) 28  Temp:   97.7 F (36.5 C) 97.8 F (36.6 C)  TempSrc:   Oral Oral  SpO2: 98%  97% 99%  Weight:      Height:        Body mass index is 28 kg/m.   Physical Exam   Constitutional: Appears well-developed and well-nourished.  Psych: Affect appropriate to situation.  Eyes: No scleral injection.  HENT: No OP obstruction.  Head: Normocephalic.  Cardiovascular: Normal rate and regular rhythm.  Respiratory: Effort normal, non-labored breathing.  GI: Soft.  No distension. There is no tenderness.  Skin: WDI.   Neurologic Examination   Mental Status: Patient is awake, alert, oriented to person, place, month, year, and situation. Patient is able to give a clear and coherent history. No signs of aphasia or neglect Cranial Nerves: II: Visual Fields are full. Pupils are equal, round, and reactive to light.   III,IV,  VI: EOMI without ptosis or diplopia.  V: Facial sensation is symmetric to temperature VII: Facial movement is symmetric resting and smiling VIII: Hearing is intact to voice X: Palate elevates symmetrically XI: Shoulder shrug is symmetric. XII: Tongue protrudes midline without atrophy or fasciculations.  Motor: Tone is normal. Bulk is normal.  5 out of 5 strength in all extremities except 4+ out of 5 strength in the left shoulder.  No drift. Sensory: Sensation is symmetric to light touch and temperature in the arms and legs. No extinction to DSS present.  Cerebellar: FNF and HKS are intact bilaterally  Labs/Imaging/Neurodiagnostic studies   CBC:  Recent Labs  Lab 07/15/2023 0836  WBC 4.3  NEUTROABS 1.7  HGB 12.6*  HCT 38.2*  MCV 93.9  PLT 156   Basic Metabolic Panel:  Lab Results  Component Value Date   NA 141 July 15, 2023   K 4.0 07/15/23   CO2 22 2023-07-15   GLUCOSE 100 (H) Jul 15, 2023   BUN 19 07/15/23   CREATININE 0.84 2023/07/15   CALCIUM  9.2 07-15-2023   GFRNONAA >60 15-Jul-2023   GFRAA >60 07/22/2019   Lipid Panel:  Lab Results  Component Value Date   LDLCALC 101 (H) 12/11/2018   HgbA1c:  Lab Results  Component Value Date   HGBA1C 6.4 12/11/2018    Alcohol Level     Component Value Date/Time   [facility] <15 07/15/23 0836   INR  Lab Results  Component Value Date   INR 1.2 07-15-23   APTT  Lab Results  Component Value Date   APTT 29 2023/07/15    CT Head without contrast: No CT evidence of acute intracranial abnormality. Expected interval evolution of now chronic infarct in the medial right occipital lobe. Additional chronic infarct in the posterior right cerebellum. Chronic microvascular ischemic changes.  CT angio Head and Neck with contrast: Pending   MRI Brain (Personally reviewed): Small acute to early subacute right frontal and right cerebellar infarcts.  ASSESSMENT   Phillip Booth is a 73 y.o. male hx of CHF, DVT, PE and Afib  who initially presented with left upper extremity weakness and speech difficulties to [facility].  - MRI shows infarcts in the right frontal aspect and right cerebellum.   - Given his history it is reasonable to start him back on Eliquis  if he is able to afford it.  Pharmacy consult and TOC consult placed for pricing of both Xarelto and Eliquis /medication assistance. Otherwise, may need to be anticoagulated with warfarin.  - Admit to hospitalist service for stroke workup.  Stroke team to follow tomorrow  RECOMMENDATIONS  - HgbA1c, fasting lipid panel - CTA Head and Neck  - Frequent neuro checks - Echocardiogram - Venous duplex to assess for possible DVT recurrence - Prophylactic therapy-Start Eliquis . Low risk for hemorrhagic conversion of the acute strokes, as they are small in size - Risk factor modification - Telemetry monitoring - PT consult, OT consult, Speech consult - Stroke team to follow  ______________________________________________________________________    Signed, Imogene Mana, NP [facility]  I have seen and examined the patient. I have discussed the assessment and recommendations with the Neurology NP and made amendations as needed. 73 year old male with a PMHx of CHF, DVT, PE and Afib who initially presented with left upper extremity weakness and speech difficulties to [facility]. MRI shows acute infarcts in the right frontal lobe and right cerebellum. Exam reveals 5 out of 5 strength in all extremities except 4+ out of 5 strength in the left shoulder; no drift. NIHSS 0. Given his history it is reasonable to start him back on Eliquis  if he is able to afford it; Pharmacy consult and TOC consult placed for pricing of both Xarelto and Eliquis /medication assistance. Otherwise, may need to be anticoagulated with warfarin. Admit to hospitalist service for stroke workup.  Electronically signed: Dr. Mikael Debell

## 2023-06-22 NOTE — Progress Notes (Signed)
 SWOT: I was called to assist with MRI. Pt has pacemaker, which was set by provider. Pt was concerning about his claustrophobia and Right arm weakness, which were reported to MD at ED. Quick neuro was assessed.No abnormality noted.  Emotional support and encouraging was provided. MRI was done without complications. Pt expressed feeling calm. Neuro was rechecked, no changes noted. Pt returned to ED, reported to RN. Bed in low position, call bell in reach

## 2023-06-22 NOTE — ED Provider Notes (Signed)
 Patient signed out to me by previous provider. Please refer to their note for full HPI.  Briefly this is a 73 year old male with wake up numbness and speech difficulty.  Was seen in outside facility with negative initial imaging.  Neurology was consulted and recommended MRI imaging.  Patient was transferred here and is pending MRI imaging.   Flonnie Humphrey, DO 06/22/23 1450

## 2023-06-22 NOTE — Progress Notes (Signed)
 Paged for admission, called EDP who reported that patient is leaving AMA. Will be on standby if needed for admission.   Thank you   Lanney Pitts, DO, PGY 1  [facility]

## 2023-06-22 NOTE — ED Notes (Addendum)
 Patient going POV to [facility] for MRI--Dr. Charlee Conine accepting

## 2023-06-22 NOTE — Discharge Instructions (Signed)
 You were seen for your stroke in the emergency department.   At home, please start taking Eliquis  to an additional strokes.    Check your MyChart online for the results of any tests that had not resulted by the time you left the emergency department.   Follow-up with your primary doctor in 2-3 days regarding your visit.  Follow-up with neurology as soon as possible.  Return immediately to the emergency department if you experience any of the following: Facial droop, difficulty speaking, weakness or numbness of an arm or leg, severe headaches, or any other concerning symptoms.    Thank you for visiting our Emergency Department. It was a pleasure taking care of you today.

## 2023-06-23 ENCOUNTER — Observation Stay (HOSPITAL_BASED_OUTPATIENT_CLINIC_OR_DEPARTMENT_OTHER)

## 2023-06-23 ENCOUNTER — Other Ambulatory Visit (HOSPITAL_COMMUNITY): Payer: Self-pay

## 2023-06-23 DIAGNOSIS — I639 Cerebral infarction, unspecified: Secondary | ICD-10-CM

## 2023-06-23 DIAGNOSIS — I6389 Other cerebral infarction: Secondary | ICD-10-CM

## 2023-06-23 LAB — RETICULOCYTES
Immature Retic Fract: 9.6 % (ref 2.3–15.9)
RBC.: 4.56 MIL/uL (ref 4.22–5.81)
Retic Count, Absolute: 72 10*3/uL (ref 19.0–186.0)
Retic Ct Pct: 1.6 % (ref 0.4–3.1)

## 2023-06-23 LAB — FERRITIN: Ferritin: 87 ng/mL (ref 24–336)

## 2023-06-23 LAB — TROPONIN I (HIGH SENSITIVITY): Troponin I (High Sensitivity): 35 ng/L — ABNORMAL HIGH (ref <18)

## 2023-06-23 LAB — IRON AND TIBC
Iron: 54 ug/dL (ref 45–182)
Saturation Ratios: 17 % — ABNORMAL LOW (ref 17.9–39.5)
TIBC: 315 ug/dL (ref 250–450)
UIBC: 261 ug/dL

## 2023-06-23 LAB — ECHOCARDIOGRAM COMPLETE
Area-P 1/2: 5.66 cm2
Height: 76 in
S' Lateral: 3.9 cm
Single Plane A4C EF: 50.6 %
Weight: 3680 [oz_av]

## 2023-06-23 LAB — LIPID PANEL
Cholesterol: 132 mg/dL (ref 0–200)
HDL: 51 mg/dL (ref >40)
LDL Cholesterol: 72 mg/dL (ref 0–99)
Total CHOL/HDL Ratio: 2.6 ratio
Triglycerides: 44 mg/dL (ref <150)
VLDL: 9 mg/dL (ref 0–40)

## 2023-06-23 MED ORDER — APIXABAN 2.5 MG PO TABS
5.0000 mg | ORAL_TABLET | Freq: Once | ORAL | Status: AC
  Administered 2023-06-23: 5 mg via ORAL
  Filled 2023-06-23: qty 2

## 2023-06-23 MED ORDER — APIXABAN 2.5 MG PO TABS
10.0000 mg | ORAL_TABLET | Freq: Two times a day (BID) | ORAL | Status: DC
  Administered 2023-06-23 – 2023-06-25 (×4): 10 mg via ORAL
  Filled 2023-06-23 (×4): qty 4

## 2023-06-23 MED ORDER — APIXABAN 2.5 MG PO TABS: 10.0000 mg | ORAL_TABLET | Freq: Two times a day (BID) | ORAL | Status: DC

## 2023-06-23 MED ORDER — APIXABAN 2.5 MG PO TABS: 5.0000 mg | ORAL_TABLET | Freq: Two times a day (BID) | ORAL | Status: DC

## 2023-06-23 MED ORDER — IOHEXOL 350 MG/ML SOLN
75.0000 mL | Freq: Once | INTRAVENOUS | Status: AC | PRN
  Administered 2023-06-23: 75 mL via INTRAVENOUS

## 2023-06-23 NOTE — Progress Notes (Signed)
 VASCULAR LAB    Bilateral lower extremity venous duplex has been performed.  See CV proc for preliminary results.  Relayed results to the Stroke team, Internal Medicine, and Drexel Gentles, RN  [employer] [facility], Kayven Aldaco, RVT 06/23/2023, 10:22 AM

## 2023-06-23 NOTE — Progress Notes (Signed)
 HD#0 Subjective:   Summary: Phillip Booth is a 73 y.o. male with PMH of DVT, HTN, HLD, CAD, cerebral infarction, paroxysmal Afib, non-ischemic cardiomyopathy, and symptomatic bradycardia s/p STJ CRTP who presented to the ED with left arm pain and numbness, admitted for Stroke workip   Overnight Events: None   Interim history: Patient is evaluated at bedside. Reports that LUE pain started about 2 weeks ago, localized to posterior proximal arm, described it as achy sensation. Stated that he woke yesterday morning, found LUE cold and limp. Unable to feel sensation. Reports that he had to warm LUE and move it around. Found that later, the achy pain would get worse with activity.   Reports that he was diagnosed with a PE and DVT, completed 3 months of Eliquis .  Objective:  Vital signs in last 24 hours: Vitals:   06/23/23 0143 06/23/23 0144 06/23/23 0412 06/23/23 0828  BP: (!) 109/50 (!) 163/95 131/85 (!) 162/98  Pulse: 60 62 (!) 59 60  Resp:   18 16  Temp:   98.3 F (36.8 C)   TempSrc:   Oral   SpO2:   100% 98%  Weight:      Height:       Supplemental O2: Room Air SpO2: 98 %   Physical Exam:   Constitutional: Laying in bed, no acute distress  Cardiovascular: regular rate Pulmonary/Chest: breathing comfortably  Abdominal: soft, non-tender, non-distended MSK: 5/5 grip strength, 5/5 strength BUE.  Neurological: Awake, alert, answering questions appropriately, no focal deficits Skin: warm and dry Psych: Pleasant  Filed Weights   06/22/23 0830  Weight: 104.3 kg    No intake or output data in the 24 hours ending 06/23/23 1040 Net IO Since Admission: No IO data has been entered for this period [06/23/23 1040]  Pertinent Labs:    Latest Ref Rng & Units 06/22/2023    8:36 AM 05/03/2023    8:50 PM 06/15/2022    9:28 AM  CBC  WBC 4.0 - 10.5 K/uL 4.3  4.2  4.5   Hemoglobin 13.0 - 17.0 g/dL 40.9  81.1  91.4   Hematocrit 39.0 - 52.0 % 38.2  40.3  42.2   Platelets 150 - 400  K/uL 156  196  144        Latest Ref Rng & Units 06/22/2023    8:36 AM 05/03/2023    8:50 PM 06/14/2022    4:20 AM  CMP  Glucose 70 - 99 mg/dL 782  956  213   BUN 8 - 23 mg/dL 19  19  17    Creatinine 0.61 - 1.24 mg/dL 0.86  5.78  4.69   Sodium 135 - 145 mmol/L 141  139  134   Potassium 3.5 - 5.1 mmol/L 4.0  4.0  3.4   Chloride 98 - 111 mmol/L 108  109  103   CO2 22 - 32 mmol/L 22  24  21    Calcium  8.9 - 10.3 mg/dL 9.2  9.4  8.5   Total Protein 6.5 - 8.1 g/dL 6.5     Total Bilirubin 0.0 - 1.2 mg/dL 1.2     Alkaline Phos 38 - 126 U/L 68     AST 15 - 41 U/L 23     ALT 0 - 44 U/L 14       Imaging: VAS US  LOWER EXTREMITY VENOUS (DVT) Result Date: 06/23/2023  Lower Venous DVT Study Patient Name:  Phillip Booth  Date of Exam:   06/23/2023 Medical Rec #:  [id number]      Accession #:    [id number] Date of Birth: Mar 06, 1950      Patient Gender: M Patient Age:   29 years Exam Location:  [facility] Procedure:      VAS US  LOWER EXTREMITY VENOUS (DVT) Referring Phys: GRACE LAU --------------------------------------------------------------------------------  Indications: Stroke.  Risk Factors: Acute popliteal DVT 06/13/2022. No longer on anticoagulation. Comparison Study: Prior bilateral LEV done 06/14/2022 Performing Technologist: Carleene Chase RVS  Examination Guidelines: A complete evaluation includes B-mode imaging, spectral Doppler, color Doppler, and power Doppler as needed of all accessible portions of each vessel. Bilateral testing is considered an integral part of a complete examination. Limited examinations for reoccurring indications may be performed as noted. The reflux portion of the exam is performed with the patient in reverse Trendelenburg.  +---------+---------------+---------+-----------+----------+-----------------+ RIGHT    CompressibilityPhasicitySpontaneityPropertiesThrombus Aging    +---------+---------------+---------+-----------+----------+-----------------+ CFV      Full            Yes      Yes                                    +---------+---------------+---------+-----------+----------+-----------------+ SFJ      Full                                                           +---------+---------------+---------+-----------+----------+-----------------+ FV Prox  Full                                                           +---------+---------------+---------+-----------+----------+-----------------+ FV Mid   Full                                                           +---------+---------------+---------+-----------+----------+-----------------+ FV DistalFull                                                           +---------+---------------+---------+-----------+----------+-----------------+ PFV      Full                                                           +---------+---------------+---------+-----------+----------+-----------------+ POP      Partial        No       No                   Age Indeterminate +---------+---------------+---------+-----------+----------+-----------------+ PTV      None  Acute             +---------+---------------+---------+-----------+----------+-----------------+ PERO     None                                         Acute             +---------+---------------+---------+-----------+----------+-----------------+ Gastroc  Full           Yes      Yes                                    +---------+---------------+---------+-----------+----------+-----------------+ TP trunk                                              Acute             +---------+---------------+---------+-----------+----------+-----------------+   +---------+---------------+---------+-----------+----------+-------------------+ LEFT     CompressibilityPhasicitySpontaneityPropertiesThrombus Aging       +---------+---------------+---------+-----------+----------+-------------------+ CFV      Full                                                             +---------+---------------+---------+-----------+----------+-------------------+ SFJ      Full                                                             +---------+---------------+---------+-----------+----------+-------------------+ FV Prox  Full                                                             +---------+---------------+---------+-----------+----------+-------------------+ FV Mid   Full                                                             +---------+---------------+---------+-----------+----------+-------------------+ FV DistalFull                                                             +---------+---------------+---------+-----------+----------+-------------------+ PFV      Full                                                             +---------+---------------+---------+-----------+----------+-------------------+  POP      Full                                                             +---------+---------------+---------+-----------+----------+-------------------+ PTV      Partial                                      Acute in proximal                                                         portion at TP trunk +---------+---------------+---------+-----------+----------+-------------------+ PERO     Partial                                      Acute in proximal                                                         portion at TP trunk +---------+---------------+---------+-----------+----------+-------------------+ Gastroc  Full                                                             +---------+---------------+---------+-----------+----------+-------------------+ TP trunk None                                         Acute                +---------+---------------+---------+-----------+----------+-------------------+     Summary: RIGHT: - Findings consistent with acute deep vein thrombosis involving the right posterior tibial veins, right peroneal veins, and Tibioperoneal trunk.  - Findings consistent with age indeterminate deep vein thrombosis involving the right popliteal vein.  - A cystic structure is found in the popliteal fossa.  LEFT: - Findings consistent with acute deep vein thrombosis involving proximal portion of the posterior tibial and peroneal veins at the Tibioperoneal trunk.   *See table(s) above for measurements and observations.    Preliminary    CT ANGIO HEAD NECK W WO CM Addendum Date: 06/23/2023 ADDENDUM REPORT: 06/23/2023 02:48 ADDENDUM: Since time of initial dictation, some additional history has been provided. Reportedly, the patient is experiencing symptoms of subclavian steal, left side. Upon further review of the study, the initially mentioned atherosclerotic plaque and stenosis at the origin of the right subclavian artery is greater than initially appreciated. Based off the coronal and sagittal reconstructed views, the stenosis measures up to approximately 60-65%. This is prior to the takeoff of the right vertebral artery. Relatively mild atheromatous change seen at the origin of the left subclavian  artery without hemodynamically significant stenosis. Electronically Signed   By: Virgia Griffins M.D.   On: 06/23/2023 02:48   Result Date: 06/23/2023 CLINICAL DATA:  Follow-up examination for stroke. EXAM: CT ANGIOGRAPHY HEAD AND NECK WITH AND WITHOUT CONTRAST TECHNIQUE: Multidetector CT imaging of the head and neck was performed using the standard protocol during bolus administration of intravenous contrast. Multiplanar CT image reconstructions and MIPs were obtained to evaluate the vascular anatomy. Carotid stenosis measurements (when applicable) are obtained utilizing NASCET criteria, using the distal internal  carotid diameter as the denominator. RADIATION DOSE REDUCTION: This exam was performed according to the departmental dose-optimization program which includes automated exposure control, adjustment of the mA and/or kV according to patient size and/or use of iterative reconstruction technique. CONTRAST:  75mL OMNIPAQUE  IOHEXOL  350 MG/ML SOLN COMPARISON:  None Available. FINDINGS: CT HEAD FINDINGS Brain: Cerebral volume within normal limits. Moderate chronic microvascular ischemic disease. Chronic right PCA territory infarct. No acute intracranial hemorrhage. Previously noted subcentimeter right cerebral and cerebellar infarcts not visible by CT. No other acute large vessel territory infarct. No mass lesion or midline shift. No hydrocephalus or extra-axial fluid collection. Vascular: No abnormal hyperdense vessel. Calcified atherosclerosis present at the skull base. Skull: Scalp soft tissues demonstrate no acute finding. Calvarium intact. Sinuses/Orbits: Globes orbital soft tissues within normal limits. Paranasal sinuses and mastoid air cells are largely clear. Other: None. Review of the MIP images confirms the above findings CTA NECK FINDINGS Aortic arch: Arch visualized aortic arch within normal limits for caliber. Bovine branching pattern noted. Aortic atherosclerosis. No significant stenosis about the origin the great vessels. Right carotid system: Right common and internal carotid arteries are patent without dissection. Atheromatous change about the right carotid bulb without hemodynamically significant greater than 50% stenosis. Left carotid system: Left common and internal carotid arteries are patent without dissection. Atheromatous change about the left carotid bulb without hemodynamically significant greater than 50% stenosis. Vertebral arteries: Both vertebral arteries arise from subclavian arteries. Left vertebral artery slightly dominant. No hemodynamically significant proximal subclavian artery stenosis.  Vertebral arteries are patent without significant stenosis or dissection. Skeleton: No worrisome osseous lesions. Moderately advanced cervical spondylosis. Other neck: No other acute finding. Upper chest: Streak artifact from left-sided pacemaker/AICD, partially visualized. Scattered ground-glass density with interlobular septal thickening within the visualized lungs, suggestive of mild pulmonary interstitial congestion/edema. Review of the MIP images confirms the above findings CTA HEAD FINDINGS Anterior circulation: Moderate atheromatous change about the carotid siphons without hemodynamically significant stenosis. A1 segments patent bilaterally. Normal anterior communicating artery complex. Both ACAs are mildly irregular but patent without significant stenosis. No M1 stenosis or occlusion. Distal MCA branches perfused and symmetric. Distal small vessel atheromatous irregularity noted. Posterior circulation: Atheromatous change about the dominant left V4 segment without significant stenosis. Left PICA patent. Right V4 segment mildly irregular but patent as well. Right PICA patent at its origin. Atheromatous irregularity throughout the basilar artery without high-grade stenosis. Superior cerebral arteries patent bilaterally. Both PCAs primarily supplied via the basilar. Atheromatous irregularity about the PCAs without proximal high-grade stenosis. Venous sinuses: Patent allowing for timing the contrast bolus. Anatomic variants: As above.  No aneurysm. Review of the MIP images confirms the above findings IMPRESSION: CT HEAD: 1. No acute intracranial abnormality. Previously identified subcentimeter right cerebral and cerebellar infarcts not visible by CT. 2. Chronic right PCA territory infarct. 3. Underlying moderate chronic microvascular ischemic disease. CTA HEAD AND NECK: 1. Negative CTA for large vessel occlusion or other emergent finding. 2. Atheromatous change about  the carotid bifurcations without greater than  50% stenosis. No hemodynamically significant stenosis within the neck. 3. Intracranial atherosclerotic disease, most notable about the carotid siphons and basilar artery. No proximal high-grade or correctable stenosis. 4.  Aortic Atherosclerosis (ICD10-I70.0). 5. Findings of mild pulmonary interstitial congestion/edema within the visualized lungs. Electronically Signed: By: Virgia Griffins M.D. On: 06/23/2023 01:01   DG Humerus Left Result Date: 06/22/2023 CLINICAL DATA:  Shoulder pain EXAM: LEFT HUMERUS - 2+ VIEW COMPARISON:  None Available. FINDINGS: There is no evidence of fracture or other focal bone lesions. Soft tissues are unremarkable. IMPRESSION: Negative. Electronically Signed   By: Esmeralda Hedge M.D.   On: 06/22/2023 22:08   DG Shoulder Left Result Date: 06/22/2023 CLINICAL DATA:  Pole Ng sensation left arm EXAM: LEFT SHOULDER - 2+ VIEW COMPARISON:  None Available. FINDINGS: No fracture or malalignment. Moderate AC joint and glenohumeral degenerative change. Left-sided pacing device IMPRESSION: Moderate degenerative changes. Electronically Signed   By: Esmeralda Hedge M.D.   On: 06/22/2023 22:08   MR BRAIN WO CONTRAST Result Date: 06/22/2023 CLINICAL DATA:  Neuro deficit, acute, stroke suspected. Numbness and speech difficulty. EXAM: MRI HEAD WITHOUT CONTRAST TECHNIQUE: Multiplanar, multiecho pulse sequences of the brain and surrounding structures were obtained without intravenous contrast. COMPARISON:  Head CT 06/22/2023 FINDINGS: Brain: Clustered small foci of mildly restricted diffusion involving posterior right frontal cortex are compatible with acute to early subacute infarcts. A similar subcentimeter focus of mild diffusion weighted signal abnormality is also present in the right cerebellar hemisphere. There is a small chronic infarct in the medial right occipital lobe with associated chronic blood products. Patchy T2 hyperintensities throughout the cerebral white matter bilaterally are  nonspecific but compatible with moderate chronic small vessel ischemic disease. Small chronic infarcts are present in the left thalamus and both cerebellar hemispheres. A remote insult is also noted in the body of the corpus callosum. Mild cerebral atrophy is within normal limits for age. The ventricles are normal in size. No mass, midline shift, or extra-axial fluid collection is identified. Vascular: Major intracranial vascular flow voids are preserved. Skull and upper cervical spine: Unremarkable bone marrow signal. Sinuses/Orbits: Bilateral cataract extraction. Mild mucosal thickening in the ethmoid sinuses. Clear mastoid air cells. Other: None. IMPRESSION: 1. Small acute to early subacute right frontal and right cerebellar infarcts. 2. Moderate chronic small vessel ischemic disease. 3. Multiple chronic infarcts in the posterior circulation. Electronically Signed   By: Aundra Lee M.D.   On: 06/22/2023 16:59    Assessment/Plan:   Principal Problem:   Acute cerebral infarction [facility]) Active Problems:   Second degree heart block   History of urinary tract obstruction   Essential hypertension   Atrial fibrillation (HCC)   Financial difficulties   Anemia   Pain in joint, shoulder region   Patient Summary: Phillip Booth is a 73 y.o. with a pertinent PMH of CHF, DVT, PE and Afib, presented with left upper extremity weakness and numbness, admitted for stroke workup.  Small Acute, Early Subacute R Frontal and Cerebellar infarcts BLE DVT  Presented with LUE weakness. MRB notable for infarcts in the right frontal and right cerebellar regions, with multiple chronic infarcts in the posterior circulation and chronic small vessel ischemic disease.  CTA H/N negative LVO. 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  BP changes noted with R 163/95 > L 109/50. Per chart review, has prior hx of RLE DVT, treated with Eliquis . On  exam  today, no deficits noted. Denied any recent travels, long car rides, or prolong bed bound stay.  - VAS US  BLE showed DVT posterior tibial veins, peroneal veins, tibioperoneal trunk  - Start Eliquis  10 mg BID for 7 days   - Pharmacy assistance program - Neurology on board, appreciate their recs  - Continue ASA, Crestor  10 mg daily  - LDL 72, A1c 5.8, PT 14.9, INR 1.2 -F/u ECHO findings to evaluate for PFO  -Follow-up MRA H/N   History of A- Fibrillation  Second-degree heart block status post AV pacemaker Unable to afford Xarelto and Eliquis  in the outpatient setting. On exam today, denies any chest pain or shortness of breath.  - NSR, rates 60 - Continue Eliquis  10 mg BID   Hypertension PTA Coreg  6.25 mg twice daily, Imdur  30 mg daily, amlodipine  10 mg daily, lisinopril  40 mg daily. Plan to slowly normalize blood pressures.  - Continue carvedilol  6.25 mg twice daily - Continue Imdur  30 mg daily - Continue amlodipine  10 mg daily - Continue lisinopril  40 mg daily  History of urinary tract obstruction -Continue home medication tamsulosin   Normocytic anemia Hemoglobin 12.6 (13.1), MCV 93.9; no acute signs or symptoms of bleed -Monitor CBC  Diet: Heart Healthy IVF: None,None VTE: Eliquis  Wounds: Assessed by RN  Code: Full PT/OT recs: Pending TOC recs: Medication assistance   Dispo: Anticipated discharge to Home in 2 days pending medical management.   Signature: Lanney Pitts, D.O.  Internal Medicine Resident, PGY-1 [facility]  10:40 AM, 06/23/2023   Please contact the on call pager after 5 pm and on weekends at [phone].

## 2023-06-23 NOTE — Evaluation (Signed)
 Occupational Therapy Evaluation Patient Details Name: Phillip Booth MRN: [id number] DOB: 1950-12-13 Today's Date: 06/23/2023   History of Present Illness   Pt is a 73 y.o. male who presented to the ED 6/13 withc/o LUE numbness. 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 PMH:  CHF, s/p PPM, PE, DVT, vertigo, HTN, CVA, CAD, and Afib     Clinical Impressions Pt admitted for above, PTA pt reports being ind with ADLs/iADLs, still driving. Pt currently presenting with comparable strength and coordination in BUEs, does have pain with L shoulder flexion + resistance. He was found to have a L field cut that he seemed unaware until further explanation from OT that this was likely from his past CVA. He was ambulatory in room and completing Adls with supervision. Educated pt on visual scanning, OT to follow-up with pt review more visual compensation strategies and fall safety education. Recommend pt receive Outpt OT for driving rehab AE if looking to return to driving with field cut.      If plan is discharge home, recommend the following:         Functional Status Assessment   Patient has had a recent decline in their functional status and/or demonstrates limited ability to make significant improvements in function in a reasonable and predictable amount of time     Equipment Recommendations   None recommended by OT     Recommendations for Other Services         Precautions/Restrictions   Precautions Precautions: Fall Recall of Precautions/Restrictions: Intact Precaution/Restrictions Comments: L field cut Restrictions Weight Bearing Restrictions Per Provider Order: No     Mobility Bed Mobility Overal bed mobility: Independent                  Transfers                   General transfer comment: OOB on arrival      Balance Overall balance assessment: Needs assistance Sitting-balance support: No upper extremity supported, Feet  supported Sitting balance-Leahy Scale: Normal     Standing balance support: No upper extremity supported, During functional activity Standing balance-Leahy Scale: Good                             ADL either performed or assessed with clinical judgement   ADL Overall ADL's : Needs assistance/impaired Eating/Feeding: Independent;Sitting   Grooming: Standing;Supervision/safety   Upper Body Bathing: Contact guard assist;Standing   Lower Body Bathing: Sitting/lateral leans;Set up   Upper Body Dressing : Independent;Sitting   Lower Body Dressing: Set up;Sitting/lateral leans   Toilet Transfer: Supervision/safety Toilet Transfer Details (indicate cue type and reason): pt found ambulating from bathroom         Functional mobility during ADLs: Supervision/safety (room level ambulation)       Vision Baseline Vision/History: 0 No visual deficits Ability to See in Adequate Light: 2 Moderately impaired Patient Visual Report: Blurring of vision (reports vision is more blurry in L eye) Vision Assessment?: Yes Eye Alignment: Within Functional Limits Ocular Range of Motion: Within Functional Limits Alignment/Gaze Preference: Within Defined Limits Tracking/Visual Pursuits: Able to track stimulus in all quads without difficulty Convergence: Within functional limits Visual Fields: Left visual field deficit Additional Comments: Pt noted to have L field cut in L eye, he seemed unaware of the cause until OT helped him understand the likely deficits from his prior R occipital CVA     Perception  Perception: Not tested       Praxis Praxis: WFL       Pertinent Vitals/Pain Pain Assessment Pain Assessment: Faces Faces Pain Scale: Hurts little more Pain Location: L shoulder with resistive shoulder flexion during MMT Pain Descriptors / Indicators: Aching Pain Intervention(s): Monitored during session, Limited activity within patient's tolerance     Extremity/Trunk  Assessment Upper Extremity Assessment Upper Extremity Assessment: LUE deficits/detail;Right hand dominant LUE Deficits / Details: Shoulder pain with resistive shoulder flexion, pain goes down to elbow. No pain with resistive horizontal abduction. ROM WFL, overall 5/5 MMT with exception of shoulder flexion being 3/5 LUE Sensation: WNL LUE Coordination: WNL   Lower Extremity Assessment Lower Extremity Assessment: Overall WFL for tasks assessed (symmetrical)   Cervical / Trunk Assessment Cervical / Trunk Assessment: Normal   Communication Communication Communication: No apparent difficulties   Cognition Arousal: Alert Behavior During Therapy: WFL for tasks assessed/performed Cognition: No apparent impairments                               Following commands: Intact       Cueing  General Comments   Cueing Techniques: Verbal cues  VSS   Exercises     Shoulder Instructions      Home Living Family/patient expects to be discharged to:: Private residence Living Arrangements: Non-relatives/Friends Available Help at Discharge: Friend(s);Available 24 hours/day Type of Home: House Home Access: Level entry     Home Layout: One level     Bathroom Shower/Tub: Producer, television/film/video: Standard     Home Equipment: Agricultural consultant (2 wheels)   Additional Comments: Pt's wife recently moved into a care facility.      Prior Functioning/Environment Prior Level of Function : Independent/Modified Independent;Driving             Mobility Comments: ind ADLs Comments: ind    OT Problem List: Pain;Impaired vision/perception   OT Treatment/Interventions: Self-care/ADL training;Patient/family education;Balance training;Therapeutic activities;Visual/perceptual remediation/compensation;DME and/or AE instruction      OT Goals(Current goals can be found in the care plan section)   Acute Rehab OT Goals Patient Stated Goal: To get rid of DVTs OT Goal  Formulation: With patient Time For Goal Achievement: 07/07/23 Potential to Achieve Goals: Good ADL Goals Pt Will Perform Grooming: Independently;standing Pt Will Transfer to Toilet: Independently;ambulating Pt Will Perform Tub/Shower Transfer: Shower transfer;Independently Additional ADL Goal #4: Pt will demonstrated independent use of visual compensation strategies   OT Frequency:  Min 2X/week    Co-evaluation              AM-PAC OT 6 Clicks Daily Activity     Outcome Measure Help from another person eating meals?: None Help from another person taking care of personal grooming?: A Little Help from another person toileting, which includes using toliet, bedpan, or urinal?: A Little Help from another person bathing (including washing, rinsing, drying)?: A Little Help from another person to put on and taking off regular upper body clothing?: None Help from another person to put on and taking off regular lower body clothing?: A Little 6 Click Score: 20   End of Session Nurse Communication: Mobility status  Activity Tolerance: Patient tolerated treatment well Patient left: in bed;with call bell/phone within reach  OT Visit Diagnosis: Pain;Unsteadiness on feet (R26.81);Muscle weakness (generalized) (M62.81);Low vision, both eyes (H54.2) Pain - Right/Left: Left Pain - part of body: Shoulder  Time: 2542-7062 OT Time Calculation (min): 31 min Charges:  OT General Charges $OT Visit: 1 Visit OT Evaluation $OT Eval Low Complexity: 1 Low OT Treatments $Therapeutic Activity: 8-22 mins  06/23/2023  AB, OTR/L  Acute Rehabilitation Services  Office: [phone]   Jorene New 06/23/2023, 12:58 PM

## 2023-06-23 NOTE — Plan of Care (Signed)

## 2023-06-23 NOTE — Care Management Obs Status (Signed)
 MEDICARE OBSERVATION STATUS NOTIFICATION   Patient Details  Name: Phillip Booth MRN: [id number] Date of Birth: 04/30/1950   Medicare Observation Status Notification Given:  Yes    Roxie Kreeger G., RN 06/23/2023, 4:03 PM

## 2023-06-23 NOTE — Evaluation (Signed)
 Physical Therapy Evaluation Patient Details Name: Phillip Booth MRN: [id number] DOB: 14-Nov-1950 Today's Date: 06/23/2023  History of Present Illness  Pt is a 73 y.o. male who presented to the ED 6/13 withc/o LUE numbness. 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 PMH:  CHF, s/p PPM, PE, DVT, vertigo, HTN, CVA, CAD, and Afib  Clinical Impression  Pt admitted with above diagnosis. PTA pt lived at home with a roommate, independent and driving. Pt currently with functional limitations due to the deficits listed below (see PT Problem List). On eval, pt demo independent bed mobility. Supervision transfers and CGA amb 200' without AD. Pt drifting R/L but no overt LOB. Normal sitting balance and good standing balance. Mildly impulsive and easily distracted. BLE strength/sensation intact and symmetrical. Pt will benefit from acute skilled PT to increase their independence and safety with mobility to allow discharge. Post acute, pt would benefit from OPPT to address higher level balance activities.            If plan is discharge home, recommend the following: Assist for transportation   Can travel by private vehicle        Equipment Recommendations None recommended by PT  Recommendations for Other Services       Functional Status Assessment Patient has had a recent decline in their functional status and demonstrates the ability to make significant improvements in function in a reasonable and predictable amount of time.     Precautions / Restrictions Precautions Precautions: Fall Recall of Precautions/Restrictions: Intact      Mobility  Bed Mobility Overal bed mobility: Independent                  Transfers Overall transfer level: Needs assistance Equipment used: None Transfers: Sit to/from Stand Sit to Stand: Supervision                Ambulation/Gait Ambulation/Gait assistance: Contact guard assist Gait Distance (Feet): 200 Feet Assistive device:  None Gait Pattern/deviations: Step-through pattern, Drifts right/left Gait velocity: WFL Gait velocity interpretation: >2.62 ft/sec, indicative of community ambulatory   General Gait Details: mildly unsteady, no overt LOB  Stairs            Wheelchair Mobility     Tilt Bed    Modified Rankin (Stroke Patients Only) Modified Rankin (Stroke Patients Only) Pre-Morbid Rankin Score: No symptoms Modified Rankin: Slight disability     Balance Overall balance assessment: Needs assistance Sitting-balance support: No upper extremity supported, Feet supported Sitting balance-Leahy Scale: Normal     Standing balance support: No upper extremity supported, During functional activity Standing balance-Leahy Scale: Good                               Pertinent Vitals/Pain Pain Assessment Pain Assessment: No/denies pain    Home Living Family/patient expects to be discharged to:: Private residence Living Arrangements: Non-relatives/Friends Available Help at Discharge: Friend(s);Available 24 hours/day   Home Access: Level entry       Home Layout: One level Home Equipment: Agricultural consultant (2 wheels) Additional Comments: Pt's wife recently moved into a care facility.    Prior Function Prior Level of Function : Independent/Modified Independent;Driving                     Extremity/Trunk Assessment   Upper Extremity Assessment Upper Extremity Assessment: Defer to OT evaluation    Lower Extremity Assessment Lower Extremity Assessment: Overall WFL for  tasks assessed (symmetrical)    Cervical / Trunk Assessment Cervical / Trunk Assessment: Normal  Communication   Communication Communication: No apparent difficulties    Cognition Arousal: Alert Behavior During Therapy: WFL for tasks assessed/performed, Impulsive   PT - Cognitive impairments: No apparent impairments                       PT - Cognition Comments: quickly moving between  tasks Following commands: Intact       Cueing       General Comments General comments (skin integrity, edema, etc.): VSS on RA    Exercises     Assessment/Plan    PT Assessment Patient needs continued PT services  PT Problem List Decreased balance;Decreased mobility;Decreased activity tolerance       PT Treatment Interventions Therapeutic activities;Gait training;Therapeutic exercise;Patient/family education;Stair training;Balance training;Functional mobility training    PT Goals (Current goals can be found in the Care Plan section)  Acute Rehab PT Goals Patient Stated Goal: home PT Goal Formulation: With patient Time For Goal Achievement: 07/07/23 Potential to Achieve Goals: Good    Frequency Min 3X/week     Co-evaluation               AM-PAC PT 6 Clicks Mobility  Outcome Measure Help needed turning from your back to your side while in a flat bed without using bedrails?: None Help needed moving from lying on your back to sitting on the side of a flat bed without using bedrails?: None Help needed moving to and from a bed to a chair (including a wheelchair)?: A Little Help needed standing up from a chair using your arms (e.g., wheelchair or bedside chair)?: A Little Help needed to walk in hospital room?: A Little Help needed climbing 3-5 steps with a railing? : A Little 6 Click Score: 20    End of Session Equipment Utilized During Treatment: Gait belt Activity Tolerance: Patient tolerated treatment well Patient left: in bed;with call bell/phone within reach Nurse Communication: Mobility status PT Visit Diagnosis: Unsteadiness on feet (R26.81)    Time: 0865-7846 PT Time Calculation (min) (ACUTE ONLY): 17 min   Charges:   PT Evaluation $PT Eval Moderate Complexity: 1 Mod   PT General Charges $$ ACUTE PT VISIT: 1 Visit         Dorothye Gathers., PT  Office # [phone]   Guadelupe Leech 06/23/2023, 8:30 AM

## 2023-06-23 NOTE — Progress Notes (Signed)
 STROKE TEAM PROGRESS NOTE   SUBJECTIVE (INTERVAL HISTORY) Patient seen and evaluated this morning. No acute overnight events noted. Tells me that he presented yesterday due to left arm pain and sensory changes. 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 He completed his US  which showed acute DVT in the right posterior tibial veins, right peroneal and tibioperoneal trunk and age undeterminate DVT involving the right popliteal vein. There is also acute DVT in the left posterior tibial and peroneal veins at the tibioperoneal trunk. He is restarted on Eliquis    OBJECTIVE Vitals:   06/23/23 0143 06/23/23 0144 06/23/23 0412 06/23/23 0828  BP: (!) 109/50 (!) 163/95 131/85 (!) 162/98  Pulse: 60 62 (!) 59 60  Resp:   18 16  Temp:   98.3 F (36.8 C)   TempSrc:   Oral   SpO2:   100% 98%  Weight:      Height:        CBC:  Recent Labs  Lab 06/22/23 0836  WBC 4.3  NEUTROABS 1.7  HGB 12.6*  HCT 38.2*  MCV 93.9  PLT 156    Basic Metabolic Panel:  Recent Labs  Lab 06/22/23 0836  NA 141  K 4.0  CL 108  CO2 22  GLUCOSE 100*  BUN 19  CREATININE 0.84  CALCIUM  9.2    Lipid Panel:  Recent Labs  Lab 06/23/23 0457  CHOL 132  TRIG 44  HDL 51  CHOLHDL 2.6  VLDL 9  LDLCALC 72   HgbA1c:  Lab Results  Component Value Date   HGBA1C 5.8 (H) 06/22/2023   Urine Drug Screen:     Component Value Date/Time   LABOPIA NONE DETECTED 06/22/2023 1006   COCAINSCRNUR NONE DETECTED 06/22/2023 1006   COCAINSCRNUR NEGATIVE 09/28/2007 0709   LABBENZ NONE DETECTED 06/22/2023 1006   LABBENZ NEGATIVE 09/28/2007 0709   AMPHETMU NONE DETECTED 06/22/2023 1006   THCU NONE DETECTED 06/22/2023 1006   LABBARB NONE DETECTED 06/22/2023 1006    Alcohol Level     Component Value Date/Time   [facility] <15 06/22/2023 0836    IMAGING  Results for orders placed or performed during the hospital encounter of 06/22/23  MR BRAIN WO CONTRAST   Narrative   CLINICAL DATA:  Neuro deficit, acute,  stroke suspected. Numbness and speech difficulty.  EXAM: MRI HEAD WITHOUT CONTRAST  TECHNIQUE: Multiplanar, multiecho pulse sequences of the brain and surrounding structures were obtained without intravenous contrast.  COMPARISON:  Head CT 06/22/2023  FINDINGS: Brain: Clustered small foci of mildly restricted diffusion involving posterior right frontal cortex are compatible with acute to early subacute infarcts. A similar subcentimeter focus of mild diffusion weighted signal abnormality is also present in the right cerebellar hemisphere.  There is a small chronic infarct in the medial right occipital lobe with associated chronic blood products. Patchy T2 hyperintensities throughout the cerebral white matter bilaterally are nonspecific but compatible with moderate chronic small vessel ischemic disease. Small chronic infarcts are present in the left thalamus and both cerebellar hemispheres. A remote insult is also noted in the body of the corpus callosum. Mild cerebral atrophy is within normal limits for age. The ventricles are normal in size. No mass, midline shift, or extra-axial fluid collection is identified.  Vascular: Major intracranial vascular flow voids are preserved.  Skull and upper cervical spine: Unremarkable bone marrow signal.  Sinuses/Orbits: Bilateral cataract extraction. Mild mucosal thickening in the ethmoid sinuses. Clear mastoid air cells.  Other: None.  IMPRESSION: 1. Small acute to  early subacute right frontal and right cerebellar infarcts. 2. Moderate chronic small vessel ischemic disease. 3. Multiple chronic infarcts in the posterior circulation.   Electronically Signed   By: Aundra Lee M.D.   On: 06/22/2023 16:59   CT HEAD WO CONTRAST   Narrative   CLINICAL DATA:  Neuro deficit, concern for stroke, tingling/pulling sensation in left arm last night.  EXAM: CT HEAD WITHOUT CONTRAST  TECHNIQUE: Contiguous axial images were obtained from  the base of the skull through the vertex without intravenous contrast.  RADIATION DOSE REDUCTION: This exam was performed according to the departmental dose-optimization program which includes automated exposure control, adjustment of the mA and/or kV according to patient size and/or use of iterative reconstruction technique.  COMPARISON:  CTA head and neck 05/03/2023.  FINDINGS: Brain: No acute intracranial hemorrhage. Interval evolution of previously noted infarct in the medial right occipital lobe now with chronic appearance. Additional chronic infarct in the posterosuperior right cerebellum. Nonspecific hypoattenuation in the periventricular and subcortical white matter favored to reflect chronic microvascular ischemic changes. No edema, mass effect, or midline shift. Basilar cisterns are patent. No extra-axial fluid collection.  Ventricles: The ventricles are normal.  Vascular: Atherosclerotic calcifications of the carotid siphons and intracranial vertebral arteries. No hyperdense vessel.  Skull: No acute or aggressive finding.  Orbits: Bilateral lens replacement.  Sinuses: Mild mucosal thickening in the ethmoid and maxillary sinuses.  Other: Mastoid air cells are clear.  IMPRESSION: No CT evidence of acute intracranial abnormality.  Expected interval evolution of now chronic infarct in the medial right occipital lobe.  Additional chronic infarct in the posterior right cerebellum.  Chronic microvascular ischemic changes.   Electronically Signed   By: Denny Flack M.D.   On: 06/22/2023 09:38   Results for orders placed or performed during the hospital encounter of 09/28/07  MR Brain W Wo Contrast   Narrative   Clinical Data:  Left-sided weakness.  Stroke.   MRI HEAD WITHOUT AND WITH CONTRAST MRA HEAD WITHOUT CONTRAST MRA NECK WITHOUT AND WITH CONTRAST   Technique: Multiplanar, multiecho pulse sequences of the brain and surrounding structures were  obtained according to standard protocol without and with intravenous contrast.  Angiographic images of the Circle of Willis were obtained using MRA technique without intravenous contrast.  Angiographic images of the neck were obtained using MRA technique without and with intravenous contrast.   Contrast: 20 ml Multihance.   Comparison: Head CT 09/27/2007.   MRI HEAD   Findings:  Initial times a skin the patient were performed 09/28/2007.  T1 sagittal images and a significantly motion degraded MRA sequence were performed before the patient ended the exam.  The patient is brought back on the morning of 09/29/2007 and the exam was performed.   Diffusion weighted images demonstrate an acute infarct involving the posterior limb of the right internal capsule and superior thalamus.  There is no evidence for hemorrhage.  Entire study is somewhat degraded by patient motion.  Mild periventricular and subcortical white matter hyperintensity is seen at T2 and FLAIR sequences bilaterally.  Post contrast images are more significantly degraded by patient motion but demonstrate no definite pathologic enhancement.  The flow is present in the major intracranial arteries.  The globes and orbits are intact.  There is scattered opacification of anterior ethmoid air cells, left greater than right.  Mild mucosal thickening is evident in the maxillary sinuses.  The mastoid air cells are clear.  Midline structures are within normal limits.   IMPRESSION:  1.  Acute / subacute infarct involving the posterior limb right internal capsule and right thalamus. 2.  Scattered periventricular subcortical T2 hyperintensities bilaterally.  These nonspecific, but likely reflect the sequelae of chronic microvascular ischemia.   MRA HEAD   Findings: The normal signal is present within the internal carotid arteries from the high cervical segments through the ICA termini. The A1 and M1 segments are normal.  MCA  and ACA branch vessels demonstrate minimal segmental irregularity.  No significant proximal stenosis or branch vessel occlusion is evident. The vertebral arteries are codominant.  Both posterior cerebral arteries originate from the basilar tip.  There is some segmental irregularity of the distal branch vessels.   IMPRESSION:   1.  Mild distal branch vessel segmental irregularity of the MCA and distal PCA branches compatible with some degree of intracranial atherosclerotic disease. 2.  No significant proximal stenosis, aneurysm, or branch vessel occlusion.   MRA NECK   Findings: Time-of-flight images demonstrate to no significant flow disturbance in either internal carotid artery.  Flow is antegrade within the vertebral arteries bilaterally.   Post contrast images demonstrate a common origin of the left common carotid artery brachial cephalic artery.  There is signal loss at the origin of the left subclavian which appears to be artifactual. Both vertebral arteries originate from the subclavians.   The left vertebral artery is dominant to the right.  The fifth there is minimal tortuosity of the proximal left internal carotid artery.  No significant stenosis is seen within either internal carotid artery.   IMPRESSION:   1.  Normal MRA of the neck.  Provider: Gorge Laud  MR Angiogram Head Wo Contrast   Narrative   Clinical Data:  Left-sided weakness.  Stroke.   MRI HEAD WITHOUT AND WITH CONTRAST MRA HEAD WITHOUT CONTRAST MRA NECK WITHOUT AND WITH CONTRAST   Technique: Multiplanar, multiecho pulse sequences of the brain and surrounding structures were obtained according to standard protocol without and with intravenous contrast.  Angiographic images of the Circle of Willis were obtained using MRA technique without intravenous contrast.  Angiographic images of the neck were obtained using MRA technique without and with intravenous contrast.   Contrast: 20 ml  Multihance.   Comparison: Head CT 09/27/2007.   MRI HEAD   Findings:  Initial times a skin the patient were performed 09/28/2007.  T1 sagittal images and a significantly motion degraded MRA sequence were performed before the patient ended the exam.  The patient is brought back on the morning of 09/29/2007 and the exam was performed.   Diffusion weighted images demonstrate an acute infarct involving the posterior limb of the right internal capsule and superior thalamus.  There is no evidence for hemorrhage.  Entire study is somewhat degraded by patient motion.  Mild periventricular and subcortical white matter hyperintensity is seen at T2 and FLAIR sequences bilaterally.  Post contrast images are more significantly degraded by patient motion but demonstrate no definite pathologic enhancement.  The flow is present in the major intracranial arteries.  The globes and orbits are intact.  There is scattered opacification of anterior ethmoid air cells, left greater than right.  Mild mucosal thickening is evident in the maxillary sinuses.  The mastoid air cells are clear.  Midline structures are within normal limits.   IMPRESSION:   1.  Acute / subacute infarct involving the posterior limb right internal capsule and right thalamus. 2.  Scattered periventricular subcortical T2 hyperintensities bilaterally.  These nonspecific, but likely reflect the sequelae of chronic  microvascular ischemia.   MRA HEAD   Findings: The normal signal is present within the internal carotid arteries from the high cervical segments through the ICA termini. The A1 and M1 segments are normal.  MCA and ACA branch vessels demonstrate minimal segmental irregularity.  No significant proximal stenosis or branch vessel occlusion is evident. The vertebral arteries are codominant.  Both posterior cerebral arteries originate from the basilar tip.  There is some segmental irregularity of the distal branch vessels.    IMPRESSION:   1.  Mild distal branch vessel segmental irregularity of the MCA and distal PCA branches compatible with some degree of intracranial atherosclerotic disease. 2.  No significant proximal stenosis, aneurysm, or branch vessel occlusion.   MRA NECK   Findings: Time-of-flight images demonstrate to no significant flow disturbance in either internal carotid artery.  Flow is antegrade within the vertebral arteries bilaterally.   Post contrast images demonstrate a common origin of the left common carotid artery brachial cephalic artery.  There is signal loss at the origin of the left subclavian which appears to be artifactual. Both vertebral arteries originate from the subclavians.   The left vertebral artery is dominant to the right.  The fifth there is minimal tortuosity of the proximal left internal carotid artery.  No significant stenosis is seen within either internal carotid artery.   IMPRESSION:   1.  Normal MRA of the neck.  Provider: Gorge Laud       PHYSICAL EXAM  Temp:  [97.4 F (36.3 C)-98.3 F (36.8 C)] 98.3 F (36.8 C) (06/14 0412) Pulse Rate:  [58-64] 60 (06/14 0828) Resp:  [10-28] 16 (06/14 0828) BP: (109-215)/(50-121) 162/98 (06/14 0828) SpO2:  [98 %-100 %] 98 % (06/14 0828)  General - Well nourished, well developed, in no apparent distress. Cardiovascular - Regular rhythm and rate.  Mental Status -  Level of arousal and orientation to time, place, and person were intact. Language including expression, naming, repetition, comprehension was assessed and found intact. Attention span and concentration were normal. Recent and remote memory were intact. Fund of Knowledge was assessed and was intact.  Cranial Nerves II - XII - II - Visual field intact OU. III, IV, VI - Extraocular movements intact. V - Facial sensation intact bilaterally. VII - Facial movement intact bilaterally. VIII - Hearing & vestibular intact bilaterally. X -  Palate elevates symmetrically. XI - Chin turning & shoulder shrug intact bilaterally. XII - Tongue protrusion intact.  Motor Strength - The patient's strength was normal in all extremities and pronator drift was absent.  Bulk was normal and fasciculations were absent.   Motor Tone - Muscle tone was assessed at the neck and appendages and was normal. Sensory - Reports slight decrease sensation to light touch to LUE Coordination - The patient had normal movements in the hands, normal FNF no dysmetria.    Gait and Station - deferred.    ASSESSMENT/PLAN Phillip Booth is a 73 y.o. male with history of CHF, DVT, PE, Afib presenting with left upper extremity weakness and speech difficulty found to have right frontal and right cerebellar strokes.   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 Resultant  No deficit NIHSS 1 for sensory changes  CT head No acute abnormalities  MRI head Small acute to subacute right frontal and right cerebellar strokes MRA head pending.  CTA: No emergent large vessel occlusion  2D Echo  EF 50  to 55% LDL 72 HgbA1c 5.8 On full dose Eliquis   Diet Order             Diet regular Room service appropriate? Yes; Fluid consistency: Thin  Diet effective now                  aspirin  81 mg daily prior to admission, now on aspirin  81 mg daily and Eliquis  (apixaban ) daily.  Patient counseled to be compliant with his antithrombotic medications Ongoing aggressive stroke risk factor management Therapy recommendations:  Pending  Disposition:  Likely home   Hypertension       Stable  Long-term BP goal normotensive       Continue with home medications   Hyperlipidemia Home meds:  Crestor , resumed in hospital LDL 72, goal < 70 Continue statin at discharge  Diabetes type II HgbA1c 5.8, goal < 7.0  Other Stroke Risk Factors Advanced age Hx  stroke/TIA CHF   Other Active Problems Bilateral DVTs   Hospital day # 0    I have personally obtained history,examined this patient, reviewed notes, independently viewed imaging studies, participated in medical decision making and plan of care.ROS completed by me personally and pertinent positives fully documented. I spent a total of 40 minutes dedicated to the care of the this patient. No deficits from the strokes. We review price of Eliquis  vs. Xarelto vs. Coumadin. Patient likely will be discharge home since no deficits from the stroke.   Cassandra Cleveland, MD  Neurology  06/23/2023 1:07 PM   To contact Stroke Continuity provider, please refer to [email or web address]. After hours, contact General Neurology

## 2023-06-23 NOTE — Progress Notes (Signed)
  Echocardiogram 2D Echocardiogram has been performed.  Phillip Booth 06/23/2023, 9:44 AM

## 2023-06-23 NOTE — Progress Notes (Signed)
 OT Cancellation Note  Patient Details Name: Phillip Booth MRN: [id number] DOB: Jul 27, 1950   Cancelled Treatment:    Reason Eval/Treat Not Completed: Patient at procedure or test/ unavailable (Pt off unit, OT will follow-up with pt as able)  06/23/2023  AB, OTR/L  Acute Rehabilitation Services  Office: [phone]   Jorene New 06/23/2023, 9:33 AM

## 2023-06-24 DIAGNOSIS — I639 Cerebral infarction, unspecified: Secondary | ICD-10-CM | POA: Diagnosis not present

## 2023-06-24 LAB — BASIC METABOLIC PANEL WITH GFR
Anion gap: 10 (ref 5–15)
BUN: 11 mg/dL (ref 8–23)
CO2: 22 mmol/L (ref 22–32)
Calcium: 9.1 mg/dL (ref 8.9–10.3)
Chloride: 105 mmol/L (ref 98–111)
Creatinine, Ser: 0.76 mg/dL (ref 0.61–1.24)
GFR, Estimated: >60 mL/min (ref >60)
Glucose, Bld: 109 mg/dL — ABNORMAL HIGH (ref 70–99)
Potassium: 3.8 mmol/L (ref 3.5–5.1)
Sodium: 137 mmol/L (ref 135–145)

## 2023-06-24 LAB — CBC
HCT: 39.6 % (ref 39.0–52.0)
Hemoglobin: 13.3 g/dL (ref 13.0–17.0)
MCH: 30.6 pg (ref 26.0–34.0)
MCHC: 33.6 g/dL (ref 30.0–36.0)
MCV: 91.2 fL (ref 80.0–100.0)
Platelets: 182 10*3/uL (ref 150–400)
RBC: 4.34 MIL/uL (ref 4.22–5.81)
RDW: 13.2 % (ref 11.5–15.5)
WBC: 4.3 10*3/uL (ref 4.0–10.5)
nRBC: 0 % (ref 0.0–0.2)

## 2023-06-24 LAB — MAGNESIUM: Magnesium: 2 mg/dL (ref 1.7–2.4)

## 2023-06-24 MED ORDER — ACETAMINOPHEN 325 MG PO TABS
650.0000 mg | ORAL_TABLET | Freq: Four times a day (QID) | ORAL | Status: DC | PRN
  Administered 2023-06-24 – 2023-06-25 (×2): 650 mg via ORAL
  Filled 2023-06-24 (×2): qty 2

## 2023-06-24 NOTE — Progress Notes (Signed)
 STROKE TEAM PROGRESS NOTE   SUBJECTIVE (INTERVAL HISTORY) Patient seen and evaluated this morning. No acute overnight events noted. No family at bedside. States that he will need help paying for his anticoagulation.    OBJECTIVE Vitals:   06/24/23 0016 06/24/23 0417 06/24/23 0734 06/24/23 1120  BP: (!) 143/88 (!) 154/91 (!) 155/87 (!) 106/94  Pulse: 64 60 66 99  Resp: 16 14 16 16   Temp: 98.5 F (36.9 C) 97.7 F (36.5 C) 98 F (36.7 C) 98.5 F (36.9 C)  TempSrc: Oral Oral Oral Oral  SpO2: 95% 100% 97% 93%  Weight:      Height:        CBC:  Recent Labs  Lab 06/22/23 0836 06/24/23 0717  WBC 4.3 4.3  NEUTROABS 1.7  --   HGB 12.6* 13.3  HCT 38.2* 39.6  MCV 93.9 91.2  PLT 156 182    Basic Metabolic Panel:  Recent Labs  Lab 06/22/23 0836 06/24/23 0717  NA 141 137  K 4.0 3.8  CL 108 105  CO2 22 22  GLUCOSE 100* 109*  BUN 19 11  CREATININE 0.84 0.76  CALCIUM  9.2 9.1  MG  --  2.0    Lipid Panel:  Recent Labs  Lab 06/23/23 0457  CHOL 132  TRIG 44  HDL 51  CHOLHDL 2.6  VLDL 9  LDLCALC 72   HgbA1c:  Lab Results  Component Value Date   HGBA1C 5.8 (H) 06/22/2023   Urine Drug Screen:     Component Value Date/Time   LABOPIA NONE DETECTED 06/22/2023 1006   COCAINSCRNUR NONE DETECTED 06/22/2023 1006   COCAINSCRNUR NEGATIVE 09/28/2007 0709   LABBENZ NONE DETECTED 06/22/2023 1006   LABBENZ NEGATIVE 09/28/2007 0709   AMPHETMU NONE DETECTED 06/22/2023 1006   THCU NONE DETECTED 06/22/2023 1006   LABBARB NONE DETECTED 06/22/2023 1006    Alcohol Level     Component Value Date/Time   [facility] <15 06/22/2023 0836    IMAGING  Results for orders placed or performed during the hospital encounter of 06/22/23  MR BRAIN WO CONTRAST   Narrative   CLINICAL DATA:  Neuro deficit, acute, stroke suspected. Numbness and speech difficulty.  EXAM: MRI HEAD WITHOUT CONTRAST  TECHNIQUE: Multiplanar, multiecho pulse sequences of the brain and surrounding structures  were obtained without intravenous contrast.  COMPARISON:  Head CT 06/22/2023  FINDINGS: Brain: Clustered small foci of mildly restricted diffusion involving posterior right frontal cortex are compatible with acute to early subacute infarcts. A similar subcentimeter focus of mild diffusion weighted signal abnormality is also present in the right cerebellar hemisphere.  There is a small chronic infarct in the medial right occipital lobe with associated chronic blood products. Patchy T2 hyperintensities throughout the cerebral white matter bilaterally are nonspecific but compatible with moderate chronic small vessel ischemic disease. Small chronic infarcts are present in the left thalamus and both cerebellar hemispheres. A remote insult is also noted in the body of the corpus callosum. Mild cerebral atrophy is within normal limits for age. The ventricles are normal in size. No mass, midline shift, or extra-axial fluid collection is identified.  Vascular: Major intracranial vascular flow voids are preserved.  Skull and upper cervical spine: Unremarkable bone marrow signal.  Sinuses/Orbits: Bilateral cataract extraction. Mild mucosal thickening in the ethmoid sinuses. Clear mastoid air cells.  Other: None.  IMPRESSION: 1. Small acute to early subacute right frontal and right cerebellar infarcts. 2. Moderate chronic small vessel ischemic disease. 3. Multiple chronic infarcts in the posterior  circulation.   Electronically Signed   By: Aundra Lee M.D.   On: 06/22/2023 16:59   CT HEAD WO CONTRAST   Narrative   CLINICAL DATA:  Neuro deficit, concern for stroke, tingling/pulling sensation in left arm last night.  EXAM: CT HEAD WITHOUT CONTRAST  TECHNIQUE: Contiguous axial images were obtained from the base of the skull through the vertex without intravenous contrast.  RADIATION DOSE REDUCTION: This exam was performed according to the departmental dose-optimization program  which includes automated exposure control, adjustment of the mA and/or kV according to patient size and/or use of iterative reconstruction technique.  COMPARISON:  CTA head and neck 05/03/2023.  FINDINGS: Brain: No acute intracranial hemorrhage. Interval evolution of previously noted infarct in the medial right occipital lobe now with chronic appearance. Additional chronic infarct in the posterosuperior right cerebellum. Nonspecific hypoattenuation in the periventricular and subcortical white matter favored to reflect chronic microvascular ischemic changes. No edema, mass effect, or midline shift. Basilar cisterns are patent. No extra-axial fluid collection.  Ventricles: The ventricles are normal.  Vascular: Atherosclerotic calcifications of the carotid siphons and intracranial vertebral arteries. No hyperdense vessel.  Skull: No acute or aggressive finding.  Orbits: Bilateral lens replacement.  Sinuses: Mild mucosal thickening in the ethmoid and maxillary sinuses.  Other: Mastoid air cells are clear.  IMPRESSION: No CT evidence of acute intracranial abnormality.  Expected interval evolution of now chronic infarct in the medial right occipital lobe.  Additional chronic infarct in the posterior right cerebellum.  Chronic microvascular ischemic changes.   Electronically Signed   By: Denny Flack M.D.   On: 06/22/2023 09:38   Results for orders placed or performed during the hospital encounter of 09/28/07  MR Brain W Wo Contrast   Narrative   Clinical Data:  Left-sided weakness.  Stroke.   MRI HEAD WITHOUT AND WITH CONTRAST MRA HEAD WITHOUT CONTRAST MRA NECK WITHOUT AND WITH CONTRAST   Technique: Multiplanar, multiecho pulse sequences of the brain and surrounding structures were obtained according to standard protocol without and with intravenous contrast.  Angiographic images of the Circle of Willis were obtained using MRA technique without intravenous  contrast.  Angiographic images of the neck were obtained using MRA technique without and with intravenous contrast.   Contrast: 20 ml Multihance.   Comparison: Head CT 09/27/2007.   MRI HEAD   Findings:  Initial times a skin the patient were performed 09/28/2007.  T1 sagittal images and a significantly motion degraded MRA sequence were performed before the patient ended the exam.  The patient is brought back on the morning of 09/29/2007 and the exam was performed.   Diffusion weighted images demonstrate an acute infarct involving the posterior limb of the right internal capsule and superior thalamus.  There is no evidence for hemorrhage.  Entire study is somewhat degraded by patient motion.  Mild periventricular and subcortical white matter hyperintensity is seen at T2 and FLAIR sequences bilaterally.  Post contrast images are more significantly degraded by patient motion but demonstrate no definite pathologic enhancement.  The flow is present in the major intracranial arteries.  The globes and orbits are intact.  There is scattered opacification of anterior ethmoid air cells, left greater than right.  Mild mucosal thickening is evident in the maxillary sinuses.  The mastoid air cells are clear.  Midline structures are within normal limits.   IMPRESSION:   1.  Acute / subacute infarct involving the posterior limb right internal capsule and right thalamus. 2.  Scattered periventricular subcortical  T2 hyperintensities bilaterally.  These nonspecific, but likely reflect the sequelae of chronic microvascular ischemia.   MRA HEAD   Findings: The normal signal is present within the internal carotid arteries from the high cervical segments through the ICA termini. The A1 and M1 segments are normal.  MCA and ACA branch vessels demonstrate minimal segmental irregularity.  No significant proximal stenosis or branch vessel occlusion is evident. The vertebral arteries are codominant.   Both posterior cerebral arteries originate from the basilar tip.  There is some segmental irregularity of the distal branch vessels.   IMPRESSION:   1.  Mild distal branch vessel segmental irregularity of the MCA and distal PCA branches compatible with some degree of intracranial atherosclerotic disease. 2.  No significant proximal stenosis, aneurysm, or branch vessel occlusion.   MRA NECK   Findings: Time-of-flight images demonstrate to no significant flow disturbance in either internal carotid artery.  Flow is antegrade within the vertebral arteries bilaterally.   Post contrast images demonstrate a common origin of the left common carotid artery brachial cephalic artery.  There is signal loss at the origin of the left subclavian which appears to be artifactual. Both vertebral arteries originate from the subclavians.   The left vertebral artery is dominant to the right.  The fifth there is minimal tortuosity of the proximal left internal carotid artery.  No significant stenosis is seen within either internal carotid artery.   IMPRESSION:   1.  Normal MRA of the neck.  Provider: Gorge Laud  MR Angiogram Head Wo Contrast   Narrative   Clinical Data:  Left-sided weakness.  Stroke.   MRI HEAD WITHOUT AND WITH CONTRAST MRA HEAD WITHOUT CONTRAST MRA NECK WITHOUT AND WITH CONTRAST   Technique: Multiplanar, multiecho pulse sequences of the brain and surrounding structures were obtained according to standard protocol without and with intravenous contrast.  Angiographic images of the Circle of Willis were obtained using MRA technique without intravenous contrast.  Angiographic images of the neck were obtained using MRA technique without and with intravenous contrast.   Contrast: 20 ml Multihance.   Comparison: Head CT 09/27/2007.   MRI HEAD   Findings:  Initial times a skin the patient were performed 09/28/2007.  T1 sagittal images and a significantly motion  degraded MRA sequence were performed before the patient ended the exam.  The patient is brought back on the morning of 09/29/2007 and the exam was performed.   Diffusion weighted images demonstrate an acute infarct involving the posterior limb of the right internal capsule and superior thalamus.  There is no evidence for hemorrhage.  Entire study is somewhat degraded by patient motion.  Mild periventricular and subcortical white matter hyperintensity is seen at T2 and FLAIR sequences bilaterally.  Post contrast images are more significantly degraded by patient motion but demonstrate no definite pathologic enhancement.  The flow is present in the major intracranial arteries.  The globes and orbits are intact.  There is scattered opacification of anterior ethmoid air cells, left greater than right.  Mild mucosal thickening is evident in the maxillary sinuses.  The mastoid air cells are clear.  Midline structures are within normal limits.   IMPRESSION:   1.  Acute / subacute infarct involving the posterior limb right internal capsule and right thalamus. 2.  Scattered periventricular subcortical T2 hyperintensities bilaterally.  These nonspecific, but likely reflect the sequelae of chronic microvascular ischemia.   MRA HEAD   Findings: The normal signal is present within the internal carotid arteries from the  high cervical segments through the ICA termini. The A1 and M1 segments are normal.  MCA and ACA branch vessels demonstrate minimal segmental irregularity.  No significant proximal stenosis or branch vessel occlusion is evident. The vertebral arteries are codominant.  Both posterior cerebral arteries originate from the basilar tip.  There is some segmental irregularity of the distal branch vessels.   IMPRESSION:   1.  Mild distal branch vessel segmental irregularity of the MCA and distal PCA branches compatible with some degree of intracranial atherosclerotic disease. 2.   No significant proximal stenosis, aneurysm, or branch vessel occlusion.   MRA NECK   Findings: Time-of-flight images demonstrate to no significant flow disturbance in either internal carotid artery.  Flow is antegrade within the vertebral arteries bilaterally.   Post contrast images demonstrate a common origin of the left common carotid artery brachial cephalic artery.  There is signal loss at the origin of the left subclavian which appears to be artifactual. Both vertebral arteries originate from the subclavians.   The left vertebral artery is dominant to the right.  The fifth there is minimal tortuosity of the proximal left internal carotid artery.  No significant stenosis is seen within either internal carotid artery.   IMPRESSION:   1.  Normal MRA of the neck.  Provider: Gorge Laud       PHYSICAL EXAM  Temp:  [97.7 F (36.5 C)-98.9 F (37.2 C)] 98.5 F (36.9 C) (06/15 1120) Pulse Rate:  [60-99] 99 (06/15 1120) Resp:  [14-18] 16 (06/15 1120) BP: (106-155)/(84-94) 106/94 (06/15 1120) SpO2:  [91 %-100 %] 93 % (06/15 1120)  General - Well nourished, well developed, in no apparent distress. Cardiovascular - Regular rhythm and rate.  Mental Status -  Level of arousal and orientation to time, place, and person were intact. Language including expression, naming, repetition, comprehension was assessed and found intact. Attention span and concentration were normal. Recent and remote memory were intact. Fund of Knowledge was assessed and was intact.  Cranial Nerves II - XII - II - Visual field intact OU. III, IV, VI - Extraocular movements intact. V - Facial sensation intact bilaterally. VII - Facial movement intact bilaterally. VIII - Hearing & vestibular intact bilaterally. X - Palate elevates symmetrically. XI - Chin turning & shoulder shrug intact bilaterally. XII - Tongue protrusion intact.  Motor Strength - The patient's strength was normal in all  extremities and pronator drift was absent.  Bulk was normal and fasciculations were absent.   Motor Tone - Muscle tone was assessed at the neck and appendages and was normal. Sensory - Reports slight decrease sensation to light touch to LUE Coordination - The patient had normal movements in the hands, normal FNF no dysmetria.    Gait and Station - deferred.    ASSESSMENT/PLAN [name] is a 73 y.o. male with history of CHF, DVT, PE, Afib presenting with left upper extremity weakness and speech difficulty found to have right frontal and right cerebellar strokes.   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 Resultant  No deficit NIHSS 1 for sensory changes  CT head No acute abnormalities  MRI head Small acute to subacute right frontal and right cerebellar strokes CTA: No emergent large vessel occlusion  2D Echo  EF 50 to 55% LDL 72 HgbA1c 5.8 On full dose Eliquis   Diet Order  Diet regular Room service appropriate? Yes; Fluid consistency: Thin  Diet effective now                  aspirin  81 mg daily prior to admission, now on aspirin  81 mg daily and Eliquis  (apixaban ) daily.  Patient counseled to be compliant with his antithrombotic medications. Will need med from [facility] pharmacy Ongoing aggressive stroke risk factor management Therapy recommendations:  Pending  Disposition:  Likely home tomorrow after obtaining medication for [facility]   Hypertension       Stable  Long-term BP goal normotensive       Continue with home medications   Hyperlipidemia Home meds:  Crestor , resumed in hospital LDL 72, goal < 70 Continue statin at discharge  Diabetes type II HgbA1c 5.8, goal < 7.0  Other Stroke Risk Factors Advanced age Hx stroke/TIA CHF   Other Active Problems Bilateral DVTs   Hospital day # 0   I personally spent a total of 40  minutes in the care of the patient today including preparing to see the patient, getting/reviewing separately obtained history, performing a medically appropriate exam/evaluation, counseling and educating, referring and communicating with other health care professionals, documenting clinical information in the EHR, communicating results, coordinating care. Patient unable to afford medication at the moment. He will need medication from [facility] pharmacy prior to going home. We will sign off. Please do not hesitate to contact us  for any questions.   Cassandra Cleveland, MD  Neurology  06/24/2023 12:18 PM   To contact Stroke Continuity provider, please refer to [email or web address]. After hours, contact General Neurology

## 2023-06-24 NOTE — Progress Notes (Addendum)
 RNCM met with patient at [facility].  Patient given GoodRx and Xarelto cards.  Discussed using different pharmacies-patient currently uses [facility]. Patient states he cannot afford his medicine for probably 3 weeks.  Patient will need meds from [facility] pharmacy.  Dr. Lydia Sams notified.

## 2023-06-24 NOTE — Plan of Care (Signed)

## 2023-06-24 NOTE — Progress Notes (Addendum)
 HD#0 Subjective:   Summary: This is a 73 year old male with a past medical history of prior DVT, PE, A-fib who presented with concerns of dizziness, left arm weakness and found to have right frontal and cerebellar infarcts.  Patient was admitted for further evaluation and management of new strokes.  Overnight Events: No acute events overnight  Evaluated patient at bedside today.  He states he is doing fine.  He does report having a little bit of brain fog, but otherwise he states he is doing well.  His arm is slowly improving.  He denies any deficits today.  He is eating and drinking with no concerns.  Objective:  Vital signs in last 24 hours: Vitals:   06/23/23 1604 06/23/23 2019 06/24/23 0016 06/24/23 0417  BP: (!) 149/92 132/84 (!) 143/88 (!) 154/91  Pulse: 71 64 64 60  Resp: 14 18 16 14   Temp: 98.5 F (36.9 C) 98.9 F (37.2 C) 98.5 F (36.9 C) 97.7 F (36.5 C)  TempSrc: Oral Oral Oral Oral  SpO2: 91% 99% 95% 100%  Weight:      Height:       Supplemental O2: Room Air SpO2: 100 %   Physical Exam:  Constitutional: Resting in bed, no acute distress Cardiovascular: regular rate and rhythm, no m/r/g Pulmonary/Chest: normal work of breathing on room air, lungs clear to auscultation bilaterally Neuro: Alert and oriented x 3, 5/5 strength noted in bilateral upper and lower extremities, normal gait, no sensation deficits appreciated  Filed Weights   06/22/23 0830  Weight: 104.3 kg    No intake or output data in the 24 hours ending 06/24/23 0608 Net IO Since Admission: No IO data has been entered for this period [06/24/23 0608]  Pertinent Labs:    Latest Ref Rng & Units 06/22/2023    8:36 AM 05/03/2023    8:50 PM 06/15/2022    9:28 AM  CBC  WBC 4.0 - 10.5 K/uL 4.3  4.2  4.5   Hemoglobin 13.0 - 17.0 g/dL 16.1  09.6  04.5   Hematocrit 39.0 - 52.0 % 38.2  40.3  42.2   Platelets 150 - 400 K/uL 156  196  144        Latest Ref Rng & Units 06/22/2023    8:36 AM 05/03/2023     8:50 PM 06/14/2022    4:20 AM  CMP  Glucose 70 - 99 mg/dL 409  811  914   BUN 8 - 23 mg/dL 19  19  17    Creatinine 0.61 - 1.24 mg/dL 7.82  9.56  2.13   Sodium 135 - 145 mmol/L 141  139  134   Potassium 3.5 - 5.1 mmol/L 4.0  4.0  3.4   Chloride 98 - 111 mmol/L 108  109  103   CO2 22 - 32 mmol/L 22  24  21    Calcium  8.9 - 10.3 mg/dL 9.2  9.4  8.5   Total Protein 6.5 - 8.1 g/dL 6.5     Total Bilirubin 0.0 - 1.2 mg/dL 1.2     Alkaline Phos 38 - 126 U/L 68     AST 15 - 41 U/L 23     ALT 0 - 44 U/L 14       Imaging: ECHOCARDIOGRAM COMPLETE Result Date: 06/23/2023    ECHOCARDIOGRAM REPORT   Patient Name:   Phillip Booth Date of Exam: 06/23/2023 Medical Rec #:  [id number]     Height:  76.0 in Accession #:    [id number]    Weight:       230.0 lb Date of Birth:  [birth year]     BSA:          2.351 m Patient Age:    72 years      BP:           162/98 mmHg Patient Gender: M             HR:           60 bpm. Exam Location:  Inpatient Procedure: 2D Echo (Both Spectral and Color Flow Doppler were utilized during            procedure). Indications:    stroke  History:        Patient has prior history of Echocardiogram examinations, most                 recent 06/15/2022. Cardiomyopathy, Pacemaker, Arrythmias:Atrial                 Fibrillation; Risk Factors:Hypertension.  Sonographer:    Dione Franks RDCS Referring Phys: [id number] GRACE LAU IMPRESSIONS  1. Left ventricular ejection fraction, by estimation, is 50 to 55%. The left ventricle has low normal function. The left ventricle has no regional wall motion abnormalities. There is moderate left ventricular hypertrophy. Left ventricular diastolic parameters are consistent with Grade III diastolic dysfunction (restrictive). Elevated left atrial pressure.  2. Right ventricular systolic function is normal. The right ventricular size is normal. There is normal pulmonary artery systolic pressure. The estimated right ventricular systolic pressure is 26.6 mmHg.   3. Left atrial size was mildly dilated.  4. The mitral valve is normal in structure. Mild mitral valve regurgitation.  5. The aortic valve is tricuspid. Aortic valve regurgitation is mild. Aortic valve sclerosis is present, with no evidence of aortic valve stenosis.  6. The inferior vena cava is normal in size with greater than 50% respiratory variability, suggesting right atrial pressure of 3 mmHg. FINDINGS  Left Ventricle: Left ventricular ejection fraction, by estimation, is 50 to 55%. The left ventricle has low normal function. The left ventricle has no regional wall motion abnormalities. The left ventricular internal cavity size was normal in size. There is moderate left ventricular hypertrophy. Left ventricular diastolic parameters are consistent with Grade III diastolic dysfunction (restrictive). Elevated left atrial pressure. Right Ventricle: The right ventricular size is normal. No increase in right ventricular wall thickness. Right ventricular systolic function is normal. There is normal pulmonary artery systolic pressure. The tricuspid regurgitant velocity is 2.43 m/s, and  with an assumed right atrial pressure of 3 mmHg, the estimated right ventricular systolic pressure is 26.6 mmHg. Left Atrium: Left atrial size was mildly dilated. Right Atrium: Right atrial size was normal in size. Pericardium: There is no evidence of pericardial effusion. Mitral Valve: The mitral valve is normal in structure. Mild mitral valve regurgitation. Tricuspid Valve: The tricuspid valve is normal in structure. Tricuspid valve regurgitation is trivial. Aortic Valve: The aortic valve is tricuspid. Aortic valve regurgitation is mild. Aortic valve sclerosis is present, with no evidence of aortic valve stenosis. Pulmonic Valve: The pulmonic valve was not well visualized. Pulmonic valve regurgitation is trivial. Aorta: The aortic root and ascending aorta are structurally normal, with no evidence of dilitation. Venous: The inferior  vena cava is normal in size with greater than 50% respiratory variability, suggesting right atrial pressure of 3 mmHg. IAS/Shunts: The interatrial septum was not well visualized.  LEFT VENTRICLE PLAX 2D LVIDd:         4.80 cm      Diastology LVIDs:         3.90 cm      LV e' medial:    5.22 cm/s LV PW:         1.10 cm      LV E/e' medial:  16.5 LV IVS:        1.00 cm      LV e' lateral:   7.51 cm/s LVOT diam:     2.00 cm      LV E/e' lateral: 11.4 LV SV:         56 LV SV Index:   24 LVOT Area:     3.14 cm  LV Volumes (MOD) LV vol d, MOD A4C: 132.0 ml LV vol s, MOD A4C: 65.2 ml LV SV MOD A4C:     132.0 ml RIGHT VENTRICLE             IVC RV Basal diam:  2.90 cm     IVC diam: 1.80 cm RV S prime:     13.60 cm/s TAPSE (M-mode): 1.7 cm LEFT ATRIUM             Index        RIGHT ATRIUM           Index LA diam:        4.40 cm 1.87 cm/m   RA Area:     15.30 cm LA Vol (A2C):   84.7 ml 36.03 ml/m  RA Volume:   39.60 ml  16.85 ml/m LA Vol (A4C):   89.2 ml 37.94 ml/m LA Biplane Vol: 91.5 ml 38.92 ml/m  AORTIC VALVE             PULMONIC VALVE LVOT Vmax:   96.10 cm/s  PR End Diast Vel: 12.11 msec LVOT Vmean:  58.900 cm/s LVOT VTI:    0.177 m  AORTA Ao Root diam: 3.20 cm Ao Asc diam:  3.70 cm MITRAL VALVE               TRICUSPID VALVE MV Area (PHT): 5.66 cm    TR Peak grad:   23.6 mmHg MV Decel Time: 134 msec    TR Vmax:        243.00 cm/s MV E velocity: 85.90 cm/s                            SHUNTS                            Systemic VTI:  0.18 m                            Systemic Diam: 2.00 cm Carson Clara MD Electronically signed by Carson Clara MD Signature Date/Time: 06/23/2023/12:46:57 PM    Final    VAS US  LOWER EXTREMITY VENOUS (DVT) Result Date: 06/23/2023  Lower Venous DVT Study Patient Name:  JAVAD SALVA  Date of Exam:   06/23/2023 Medical Rec #: [id number]      Accession #:    [id number] Date of Birth: [birth year]      Patient Gender: M Patient Age:   40 years Exam Location:  [facility]  Procedure:      VAS US  LOWER EXTREMITY VENOUS (DVT) Referring Phys: GRACE LAU --------------------------------------------------------------------------------  Indications: Stroke.  Risk Factors: Acute popliteal DVT 06/13/2022. No longer on anticoagulation. Comparison Study: Prior bilateral LEV done 06/14/2022 Performing Technologist: Carleene Chase RVS  Examination Guidelines: A complete evaluation includes B-mode imaging, spectral Doppler, color Doppler, and power Doppler as needed of all accessible portions of each vessel. Bilateral testing is considered an integral part of a complete examination. Limited examinations for reoccurring indications may be performed as noted. The reflux portion of the exam is performed with the patient in reverse Trendelenburg.  +---------+---------------+---------+-----------+----------+-----------------+ RIGHT    CompressibilityPhasicitySpontaneityPropertiesThrombus Aging    +---------+---------------+---------+-----------+----------+-----------------+ CFV      Full           Yes      Yes                                    +---------+---------------+---------+-----------+----------+-----------------+ SFJ      Full                                                           +---------+---------------+---------+-----------+----------+-----------------+ FV Prox  Full                                                           +---------+---------------+---------+-----------+----------+-----------------+ FV Mid   Full                                                           +---------+---------------+---------+-----------+----------+-----------------+ FV DistalFull                                                           +---------+---------------+---------+-----------+----------+-----------------+ PFV      Full                                                            +---------+---------------+---------+-----------+----------+-----------------+ POP      Partial        No       No                   Age Indeterminate +---------+---------------+---------+-----------+----------+-----------------+ PTV      None                                         Acute             +---------+---------------+---------+-----------+----------+-----------------+ PERO     None  Acute             +---------+---------------+---------+-----------+----------+-----------------+ Gastroc  Full           Yes      Yes                                    +---------+---------------+---------+-----------+----------+-----------------+ TP trunk                                              Acute             +---------+---------------+---------+-----------+----------+-----------------+   +---------+---------------+---------+-----------+----------+-------------------+ LEFT     CompressibilityPhasicitySpontaneityPropertiesThrombus Aging      +---------+---------------+---------+-----------+----------+-------------------+ CFV      Full                                                             +---------+---------------+---------+-----------+----------+-------------------+ SFJ      Full                                                             +---------+---------------+---------+-----------+----------+-------------------+ FV Prox  Full                                                             +---------+---------------+---------+-----------+----------+-------------------+ FV Mid   Full                                                             +---------+---------------+---------+-----------+----------+-------------------+ FV DistalFull                                                             +---------+---------------+---------+-----------+----------+-------------------+ PFV      Full                                                              +---------+---------------+---------+-----------+----------+-------------------+ POP      Full                                                             +---------+---------------+---------+-----------+----------+-------------------+  PTV      Partial                                      Acute in proximal                                                         portion at TP trunk +---------+---------------+---------+-----------+----------+-------------------+ PERO     Partial                                      Acute in proximal                                                         portion at TP trunk +---------+---------------+---------+-----------+----------+-------------------+ Gastroc  Full                                                             +---------+---------------+---------+-----------+----------+-------------------+ TP trunk None                                         Acute               +---------+---------------+---------+-----------+----------+-------------------+     Summary: RIGHT: - Findings consistent with acute deep vein thrombosis involving the right posterior tibial veins, right peroneal veins, and Tibioperoneal trunk.  - Findings consistent with age indeterminate deep vein thrombosis involving the right popliteal vein.  - A cystic structure is found in the popliteal fossa.  LEFT: - Findings consistent with acute deep vein thrombosis involving proximal portion of the posterior tibial and peroneal veins at the Tibioperoneal trunk.   *See table(s) above for measurements and observations. Electronically signed by Genny Kid MD on 06/23/2023 at 10:53:39 AM.    Final     Assessment/Plan:   Principal Problem:   Acute cerebral infarction [facility]) Active Problems:   Second degree heart block   History of urinary tract obstruction   Essential hypertension   Atrial fibrillation  (HCC)   Financial difficulties   Anemia   Pain in joint, shoulder region   Patient Summary: Phillip Booth is a 73 y.o. male with a past medical history of prior DVT, PE, A-fib who presented with concerns of dizziness, left arm weakness and found to have right frontal and cerebellar infarcts.  Patient was admitted for further evaluation and management of new strokes.  #Right frontal and cerebellar infarcts, embolic source #Bilateral DVT Patient evaluated bedside this morning.  He is doing well.  He did not have any concerns about weakness or sensation losses.  On my exam, patient does have some trouble finding words, but does find his words.  He states that he is feeling fine.  I watched him ambulate and he ambulated fine.  Given that patient had concern for embolic source, patient needs anticoagulation,and right now he is on eliquis  but it is not affordable. Tried to warfarin but he is not willing to get INR checks, so need to keep on eliquis .  - Continue Eliquis  as patient is not willing to do INR checks. 10 mg BID until 06/29/23, start 5 mg BID on 06/30/2023 - Continue rosuvastatin  10 mg daily - Continue aspirin  81 mg daily - Neurology following, appreciate recommendations - Follow-up MRA  #Atrial fibrillation Patient has normal sinus rhythm at this time.  This is likely paroxysmal.  Could potentially be the source of embolic stroke.  Patient is on Eliquis .  Not rate controlled or rhythm controlled at this time.  However patient is not in RVR. - Continue to monitor on telemetry - Continue Eliquis   #Hypertension Blood pressure currently measuring in the 130s.  He did have some in the 160s.  Current medications include carvedilol  6.25 mg twice daily, amlodipine  10 mg daily, Imdur  30 mg daily, lisinopril  40 mg daily.  Will continue to titrate outpatient. - Continue carvedilol  6.25 mg twice daily - Continue amlodipine  10 mg daily - Continue Imdur  30 mg daily - Continue lisinopril  40 mg  daily  #BPH This is a historical problem.  Patient is on tamsulosin  0.4 mg daily. -Continue tamsulosin  0.4 mg daily  Diet: Normal IVF: None,None VTE: DOAC Code: Full PT/OT recs: Outpatient PT  Dispo: Anticipated discharge to Home in 1 days pending Clinical improvement.   Jonelle Neri DO Internal Medicine Resident PGY-2 Please contact the on call pager after 5 pm and on weekends at [phone].

## 2023-06-25 ENCOUNTER — Other Ambulatory Visit (HOSPITAL_COMMUNITY): Payer: Self-pay

## 2023-06-25 ENCOUNTER — Encounter

## 2023-06-25 DIAGNOSIS — I639 Cerebral infarction, unspecified: Secondary | ICD-10-CM | POA: Diagnosis not present

## 2023-06-25 MED ORDER — APIXABAN 5 MG PO TABS
ORAL_TABLET | ORAL | 0 refills | Status: DC
  Filled 2023-06-25: qty 60, 30d supply, fill #0

## 2023-06-25 NOTE — Progress Notes (Addendum)
 Occupational Therapy Treatment Patient Details Name: Phillip Booth MRN: [id number] DOB: [birth year] Today's Date: 06/25/2023   History of present illness Pt is a 73 y.o. male who presented to the ED 6/13 withc/o LUE numbness. 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 PMH:  CHF, s/p PPM, PE, DVT, vertigo, HTN, CVA, CAD, and Afib   OT comments  Pt progressing towards OT goals, demonstrates ability to mobilize with supervision to modified independence in room.  Managing basic Adls with supervision to modified independence.  He is limited by L visual fiend deficit and cognition.  Pt tends to mask cognitive deficits with decreased awareness of deficits, pt scored 13/28 on short blessed test with deficits in attention, recall, sequencing and time of day orientation.  Recommend follow up with pill box test, but at this time recommend 24/7 supervision for safety and assist with driving, meds, and meals.  Will follow acutely, updated dc recs to outpatient OT services.       If plan is discharge home, recommend the following:  Direct supervision/assist for medications management;Direct supervision/assist for financial management;Assistance with cooking/housework;Supervision due to cognitive status   Equipment Recommendations  None recommended by OT    Recommendations for Other Services      Precautions / Restrictions Precautions Precautions: Fall Recall of Precautions/Restrictions: Impaired Precaution/Restrictions Comments: L field cut Restrictions Weight Bearing Restrictions Per Provider Order: No       Mobility Bed Mobility Overal bed mobility: Independent                  Transfers Overall transfer level: Needs assistance Equipment used: None Transfers: Sit to/from Stand Sit to Stand: Supervision                 Balance Overall balance assessment: Needs assistance Sitting-balance support: No upper extremity supported, Feet supported Sitting balance-Leahy  Scale: Normal     Standing balance support: No upper extremity supported, During functional activity Standing balance-Leahy Scale: Good                             ADL either performed or assessed with clinical judgement   ADL Overall ADL's : Needs assistance/impaired                 Upper Body Dressing : Set up;Standing Upper Body Dressing Details (indicate cue type and reason): new gown Lower Body Dressing: Supervision/safety;Sit to/from stand   Toilet Transfer: Supervision/safety;Ambulation           Functional mobility during ADLs: Supervision/safety General ADL Comments: mobility in room, pt easily distracted and requires cueing to stay on task.    Extremity/Trunk Assessment              Vision       Restaurant manager, fast food Communication: No apparent difficulties   Cognition Arousal: Alert Behavior During Therapy: WFL for tasks assessed/performed Cognition: Cognition impaired     Awareness: Intellectual awareness intact, Online awareness impaired Memory impairment (select all impairments): Short-term memory, Working memory Attention impairment (select first level of impairment): Sustained attention Executive functioning impairment (select all impairments): Organization, Sequencing, Reasoning, Problem solving OT - Cognition Comments: pt scored 13/28 on short blessed test revealing difficutly with recall, attention, sequencing and time of day orientation.  He tends to mask deficits, but when questioned but reveals difficulty with memory and poor awareness of deficits.  Following commands: Intact        Cueing   Cueing Techniques: Verbal cues  Exercises      Shoulder Instructions       General Comments provided visual compensatory handout for visual field deficits (L side), discussed safety and recommendations.  Educated on recommendation to have assist with med mgmt, cooking and  driving at this time. Pt voiced understanding but needs to ask someone for assist.    Pertinent Vitals/ Pain       Pain Assessment Pain Assessment: No/denies pain  Home Living                                          Prior Functioning/Environment              Frequency  Min 2X/week        Progress Toward Goals  OT Goals(current goals can now be found in the care plan section)  Progress towards OT goals: Progressing toward goals  Acute Rehab OT Goals Patient Stated Goal: get better vision OT Goal Formulation: With patient Time For Goal Achievement: 07/07/23 Potential to Achieve Goals: Good  Plan      Co-evaluation                 AM-PAC OT 6 Clicks Daily Activity     Outcome Measure   Help from another person eating meals?: None Help from another person taking care of personal grooming?: A Little Help from another person toileting, which includes using toliet, bedpan, or urinal?: A Little Help from another person bathing (including washing, rinsing, drying)?: A Little Help from another person to put on and taking off regular upper body clothing?: A Little Help from another person to put on and taking off regular lower body clothing?: A Little 6 Click Score: 19    End of Session    OT Visit Diagnosis: Unsteadiness on feet (R26.81);Muscle weakness (generalized) (M62.81);Low vision, both eyes (H54.2)   Activity Tolerance Patient tolerated treatment well   Patient Left with call bell/phone within reach;Other (comment) (EOB)   Nurse Communication Mobility status;Other (comment) (confusion)        Time: 0981-1914 OT Time Calculation (min): 31 min  Charges: OT General Charges $OT Visit: 1 Visit OT Treatments $Self Care/Home Management : 23-37 mins  Phillip Booth, OT Acute Rehabilitation Services Office [phone] Secure Chat Preferred    Phillip Booth 06/25/2023, 11:15 AM

## 2023-06-25 NOTE — Discharge Summary (Signed)
 Name: Phillip Booth MRN: [id number] DOB: 04-24-1950 73 y.o. PCP: Myrl Askew, DO  Date of Admission: 06/22/2023  8:20 AM Date of Discharge: 06/25/2023 Attending Physician: Dr. Broadus Canes  Discharge Diagnosis: Principal Problem:   Acute cerebral infarction [facility]) Active Problems:   Second degree heart block   History of urinary tract obstruction   Essential hypertension   Atrial fibrillation (HCC)   Financial difficulties   Anemia   Pain in joint, shoulder region    Discharge Medications: Allergies as of 06/25/2023       Reactions   Lactose Intolerance (gi) Hives, Nausea And Vomiting        Medication List     TAKE these medications    amLODipine  10 MG tablet Commonly known as: NORVASC  Take 1 tablet (10 mg total) by mouth daily.   apixaban  5 MG Tabs tablet Commonly known as: ELIQUIS  Take 1 tablet (5 mg total) by mouth 2 (two) times daily.   Eliquis  5 MG Tabs tablet Generic drug: apixaban  Take 2 tablets (10mg ) every 12 hours until 6/20, then take one table (5 mg) every 12 hours every day. Start taking on: June 26, 2023   aspirin  EC 81 MG tablet Take 81 mg by mouth daily.   carvedilol  6.25 MG tablet Commonly known as: COREG  TAKE 1 TABLET BY MOUTH TWICE DAILY WITH A MEAL   Flovent  HFA 110 MCG/ACT inhaler Generic drug: fluticasone  Inhale 1 puff into the lungs daily as needed (Asthma).   isosorbide  mononitrate 30 MG 24 hr tablet Commonly known as: IMDUR  TAKE 1 TABLET (30 MG TOTAL) BY MOUTH DAILY.   levocetirizine 5 MG tablet Commonly known as: XYZAL  Take 5 mg by mouth daily.   lisinopril  40 MG tablet Commonly known as: ZESTRIL  TAKE 1 TABLET (40 MG TOTAL) BY MOUTH DAILY.   montelukast  10 MG tablet Commonly known as: SINGULAIR  Take 1 tablet (10 mg total) by mouth at bedtime.   rosuvastatin  10 MG tablet Commonly known as: CRESTOR  Take 1 tablet (10 mg total) by mouth daily.   sodium chloride  0.65 % Soln nasal spray Commonly known as: OCEAN Place 1  spray into both nostrils as needed for congestion.   tamsulosin  0.4 MG Caps capsule Commonly known as: FLOMAX  Take 0.4 mg by mouth daily.        Disposition and follow-up:   Mr.Phillip Booth was discharged from [facility] in Fair condition.  At the hospital follow up visit please address:  1.  Follow-up:  a. Right Frontal and Cerebellar Infarcts No residual deficits, needs to follow-up with neurology in clinic.  I suspect cardio embolic phenomenon.   - Continue aspirin  - Continue Eliquis  10 mg for BID 7 days, then Eliquis  5 mg BID   b.  Paroxysmal A-fib - Eliquis  as above.  c.  Hypertension - Continue with outpatient antihypertensive.   2.  Labs / imaging needed at time of follow-up: CBC  3.  Pending labs/ test needing follow-up: None  4.  Medication Changes Abx -   End Date:  Follow-up Appointments:  Follow-up Information     Myrl Askew, DO. Schedule an appointment as soon as possible for a visit in 3 days.   Specialty: Internal Medicine Contact information: [address] [place] [address] [phone]         [facility]. Schedule an appointment as soon as possible for a visit in 3 days.   Contact information: [address] [place], [address] [place]   [address] [phone]        [facility] Emergency Department at [facility] Go to .   Specialty: Emergency Medicine Why: As needed, If symptoms worsen Contact information: [address] [place]  [address] [phone]        [facility] Outpatient rehab Follow up.   Why: [facility] will contact you for the first home visit. Contact information: [address]  [place], [place] [address]  [phone]                Hospital Course by problem list: [name] is a 73 y.o. male with a past medical history of prior DVT, PE, A-fib who presented with concerns of dizziness, left arm weakness and found to have  right frontal and cerebellar infarcts.  Patient was admitted for further evaluation and management of new strokes.   #Right frontal and cerebellar infarcts, embolic source #Bilateral DVT On presentation, the patient reported dizziness and left arm weakness. MRI of the brain revealed infarcts in the right frontal lobe and cerebellum. Echocardiogram was negative for patent foramen ovale (PFO). The patient's stroke is suspected to be of cardiac etiology, given a history of paroxysmal atrial fibrillation. Neurology was consulted and evaluated the patient. The patient was started on an Eliquis  loading dose; however, he reported that this medication is not affordable. Warfarin was offered as an alternative, but the patient declined due to the need for regular INR monitoring. The pharmacy team provided the patient with a prescription assistance card and completed a patient assistance form to help afford Eliquis . The patient's primary care provider (PCP) will assume responsibility for continuing the prescription.  - Continue aspirin  81 mg daily - Continue Eliquis  10 mg twice daily for 7 days then continue with Eliquis  5 mg twice daily. - Follow-up with PCP. - Follow up with Neurology    #Atrial fibrillation Paroxysmal A-fib, previously not on anticoagulation. - Continue Eliquis  as above.   #Hypertension Initially elevated, but has been hypotensive.  Continued home regimen of carvedilol , amlodipine , Imdur  and lisinopril .   #BPH - Continued home dose of tamsulosin  0.4 mg.   Discharge Subjective: Patient evaluated at bedside this AM.  Discharge Exam:   BP 118/71 (BP Location: Right Arm)   Pulse 81   Temp 98.1 F (36.7 C) (Oral)   Resp 19   Ht 6' 4 (1.93 m)   Wt 104.3 kg   SpO2 99%   BMI 28.00 kg/m   Constitutional: NAD. Cardiovascular: regular rate and rhythm, no m/r/g Pulmonary/Chest: normal work of breathing on room air, lungs clear to auscultation bilaterally Neurological: alert &  oriented x 3, no focal weakness. Skin: warm and dry   Pertinent Labs, Studies, and Procedures:     Latest Ref Rng & Units 06/24/2023    7:17 AM 06/22/2023    8:36 AM 05/03/2023    8:50 PM  CBC  WBC 4.0 - 10.5 K/uL 4.3  4.3  4.2   Hemoglobin 13.0 - 17.0 g/dL 24.4  01.0  27.2   Hematocrit 39.0 - 52.0 % 39.6  38.2  40.3   Platelets 150 - 400 K/uL 182  156  196        Latest Ref Rng & Units 06/24/2023    7:17 AM 06/22/2023    8:36 AM 05/03/2023    8:50 PM  CMP  Glucose 70 - 99 mg/dL 536  644  034   BUN 8 - 23 mg/dL 11  19  19  Creatinine 0.61 - 1.24 mg/dL 4.09  8.11  9.14   Sodium 135 - 145 mmol/L 137  141  139   Potassium 3.5 - 5.1 mmol/L 3.8  4.0  4.0   Chloride 98 - 111 mmol/L 105  108  109   CO2 22 - 32 mmol/L 22  22  24    Calcium  8.9 - 10.3 mg/dL 9.1  9.2  9.4   Total Protein 6.5 - 8.1 g/dL  6.5    Total Bilirubin 0.0 - 1.2 mg/dL  1.2    Alkaline Phos 38 - 126 U/L  68    AST 15 - 41 U/L  23    ALT 0 - 44 U/L  14      ECHOCARDIOGRAM COMPLETE Result Date: 06/23/2023    ECHOCARDIOGRAM REPORT   Patient Name:   KAYMEN ADRIAN Date of Exam: 06/23/2023 Medical Rec #:  [id number]     Height:       76.0 in Accession #:    [id number]    Weight:       230.0 lb Date of Birth:  28-Feb-1950     BSA:          2.351 m Patient Age:    72 years      BP:           162/98 mmHg Patient Gender: M             HR:           60 bpm. Exam Location:  Inpatient Procedure: 2D Echo (Both Spectral and Color Flow Doppler were utilized during            procedure). Indications:    stroke  History:        Patient has prior history of Echocardiogram examinations, most                 recent 06/15/2022. Cardiomyopathy, Pacemaker, Arrythmias:Atrial                 Fibrillation; Risk Factors:Hypertension.  Sonographer:    Dione Franks RDCS Referring Phys: [id number] GRACE LAU IMPRESSIONS  1. Left ventricular ejection fraction, by estimation, is 50 to 55%. The left ventricle has low normal function. The left ventricle has  no regional wall motion abnormalities. There is moderate left ventricular hypertrophy. Left ventricular diastolic parameters are consistent with Grade III diastolic dysfunction (restrictive). Elevated left atrial pressure.  2. Right ventricular systolic function is normal. The right ventricular size is normal. There is normal pulmonary artery systolic pressure. The estimated right ventricular systolic pressure is 26.6 mmHg.  3. Left atrial size was mildly dilated.  4. The mitral valve is normal in structure. Mild mitral valve regurgitation.  5. The aortic valve is tricuspid. Aortic valve regurgitation is mild. Aortic valve sclerosis is present, with no evidence of aortic valve stenosis.  6. The inferior vena cava is normal in size with greater than 50% respiratory variability, suggesting right atrial pressure of 3 mmHg. FINDINGS  Left Ventricle: Left ventricular ejection fraction, by estimation, is 50 to 55%. The left ventricle has low normal function. The left ventricle has no regional wall motion abnormalities. The left ventricular internal cavity size was normal in size. There is moderate left ventricular hypertrophy. Left ventricular diastolic parameters are consistent with Grade III diastolic dysfunction (restrictive). Elevated left atrial pressure. Right Ventricle: The right ventricular size is normal. No increase in right ventricular wall thickness. Right ventricular systolic function is normal. There is normal pulmonary artery  systolic pressure. The tricuspid regurgitant velocity is 2.43 m/s, and  with an assumed right atrial pressure of 3 mmHg, the estimated right ventricular systolic pressure is 26.6 mmHg. Left Atrium: Left atrial size was mildly dilated. Right Atrium: Right atrial size was normal in size. Pericardium: There is no evidence of pericardial effusion. Mitral Valve: The mitral valve is normal in structure. Mild mitral valve regurgitation. Tricuspid Valve: The tricuspid valve is normal in  structure. Tricuspid valve regurgitation is trivial. Aortic Valve: The aortic valve is tricuspid. Aortic valve regurgitation is mild. Aortic valve sclerosis is present, with no evidence of aortic valve stenosis. Pulmonic Valve: The pulmonic valve was not well visualized. Pulmonic valve regurgitation is trivial. Aorta: The aortic root and ascending aorta are structurally normal, with no evidence of dilitation. Venous: The inferior vena cava is normal in size with greater than 50% respiratory variability, suggesting right atrial pressure of 3 mmHg. IAS/Shunts: The interatrial septum was not well visualized.  LEFT VENTRICLE PLAX 2D LVIDd:         4.80 cm      Diastology LVIDs:         3.90 cm      LV e' medial:    5.22 cm/s LV PW:         1.10 cm      LV E/e' medial:  16.5 LV IVS:        1.00 cm      LV e' lateral:   7.51 cm/s LVOT diam:     2.00 cm      LV E/e' lateral: 11.4 LV SV:         56 LV SV Index:   24 LVOT Area:     3.14 cm  LV Volumes (MOD) LV vol d, MOD A4C: 132.0 ml LV vol s, MOD A4C: 65.2 ml LV SV MOD A4C:     132.0 ml RIGHT VENTRICLE             IVC RV Basal diam:  2.90 cm     IVC diam: 1.80 cm RV S prime:     13.60 cm/s TAPSE (M-mode): 1.7 cm LEFT ATRIUM             Index        RIGHT ATRIUM           Index LA diam:        4.40 cm 1.87 cm/m   RA Area:     15.30 cm LA Vol (A2C):   84.7 ml 36.03 ml/m  RA Volume:   39.60 ml  16.85 ml/m LA Vol (A4C):   89.2 ml 37.94 ml/m LA Biplane Vol: 91.5 ml 38.92 ml/m  AORTIC VALVE             PULMONIC VALVE LVOT Vmax:   96.10 cm/s  PR End Diast Vel: 12.11 msec LVOT Vmean:  58.900 cm/s LVOT VTI:    0.177 m  AORTA Ao Root diam: 3.20 cm Ao Asc diam:  3.70 cm MITRAL VALVE               TRICUSPID VALVE MV Area (PHT): 5.66 cm    TR Peak grad:   23.6 mmHg MV Decel Time: 134 msec    TR Vmax:        243.00 cm/s MV E velocity: 85.90 cm/s  SHUNTS                            Systemic VTI:  0.18 m                            Systemic Diam: 2.00 cm  Carson Clara MD Electronically signed by Carson Clara MD Signature Date/Time: 06/23/2023/12:46:57 PM    Final    VAS US  LOWER EXTREMITY VENOUS (DVT) Result Date: 06/23/2023  Lower Venous DVT Study Patient Name:  ROLF FELLS  Date of Exam:   06/23/2023 Medical Rec #: [id number]      Accession #:    [id number] Date of Birth: [birth year]      Patient Gender: M Patient Age:   89 years Exam Location:  [facility] Procedure:      VAS US  LOWER EXTREMITY VENOUS (DVT) Referring Phys: GRACE LAU --------------------------------------------------------------------------------  Indications: Stroke.  Risk Factors: Acute popliteal DVT 06/13/2022. No longer on anticoagulation. Comparison Study: Prior bilateral LEV done 06/14/2022 Performing Technologist: Carleene Chase RVS  Examination Guidelines: A complete evaluation includes B-mode imaging, spectral Doppler, color Doppler, and power Doppler as needed of all accessible portions of each vessel. Bilateral testing is considered an integral part of a complete examination. Limited examinations for reoccurring indications may be performed as noted. The reflux portion of the exam is performed with the patient in reverse Trendelenburg.  +---------+---------------+---------+-----------+----------+-----------------+ RIGHT    CompressibilityPhasicitySpontaneityPropertiesThrombus Aging    +---------+---------------+---------+-----------+----------+-----------------+ CFV      Full           Yes      Yes                                    +---------+---------------+---------+-----------+----------+-----------------+ SFJ      Full                                                           +---------+---------------+---------+-----------+----------+-----------------+ FV Prox  Full                                                           +---------+---------------+---------+-----------+----------+-----------------+ FV Mid   Full                                                            +---------+---------------+---------+-----------+----------+-----------------+ FV DistalFull                                                           +---------+---------------+---------+-----------+----------+-----------------+ PFV      Full                                                           +---------+---------------+---------+-----------+----------+-----------------+  POP      Partial        No       No                   Age Indeterminate +---------+---------------+---------+-----------+----------+-----------------+ PTV      None                                         Acute             +---------+---------------+---------+-----------+----------+-----------------+ PERO     None                                         Acute             +---------+---------------+---------+-----------+----------+-----------------+ Gastroc  Full           Yes      Yes                                    +---------+---------------+---------+-----------+----------+-----------------+ TP trunk                                              Acute             +---------+---------------+---------+-----------+----------+-----------------+   +---------+---------------+---------+-----------+----------+-------------------+ LEFT     CompressibilityPhasicitySpontaneityPropertiesThrombus Aging      +---------+---------------+---------+-----------+----------+-------------------+ CFV      Full                                                             +---------+---------------+---------+-----------+----------+-------------------+ SFJ      Full                                                             +---------+---------------+---------+-----------+----------+-------------------+ FV Prox  Full                                                              +---------+---------------+---------+-----------+----------+-------------------+ FV Mid   Full                                                             +---------+---------------+---------+-----------+----------+-------------------+ FV DistalFull                                                             +---------+---------------+---------+-----------+----------+-------------------+  PFV      Full                                                             +---------+---------------+---------+-----------+----------+-------------------+ POP      Full                                                             +---------+---------------+---------+-----------+----------+-------------------+ PTV      Partial                                      Acute in proximal                                                         portion at TP trunk +---------+---------------+---------+-----------+----------+-------------------+ PERO     Partial                                      Acute in proximal                                                         portion at TP trunk +---------+---------------+---------+-----------+----------+-------------------+ Gastroc  Full                                                             +---------+---------------+---------+-----------+----------+-------------------+ TP trunk None                                         Acute               +---------+---------------+---------+-----------+----------+-------------------+     Summary: RIGHT: - Findings consistent with acute deep vein thrombosis involving the right posterior tibial veins, right peroneal veins, and Tibioperoneal trunk.  - Findings consistent with age indeterminate deep vein thrombosis involving the right popliteal vein.  - A cystic structure is found in the popliteal fossa.  LEFT: - Findings consistent with acute deep vein thrombosis involving proximal portion  of the posterior tibial and peroneal veins at the Tibioperoneal trunk.   *See table(s) above for measurements and observations. Electronically signed by Genny Kid MD on 06/23/2023 at 10:53:39 AM.    Final    CT ANGIO HEAD NECK W WO CM Addendum Date: 06/23/2023 ADDENDUM REPORT: 06/23/2023 02:48 ADDENDUM: Since time of initial dictation, some additional  history has been provided. Reportedly, the patient is experiencing symptoms of subclavian steal, left side. Upon further review of the study, the initially mentioned atherosclerotic plaque and stenosis at the origin of the right subclavian artery is greater than initially appreciated. Based off the coronal and sagittal reconstructed views, the stenosis measures up to approximately 60-65%. This is prior to the takeoff of the right vertebral artery. Relatively mild atheromatous change seen at the origin of the left subclavian artery without hemodynamically significant stenosis. Electronically Signed   By: Virgia Griffins M.D.   On: 06/23/2023 02:48   Result Date: 06/23/2023 CLINICAL DATA:  Follow-up examination for stroke. EXAM: CT ANGIOGRAPHY HEAD AND NECK WITH AND WITHOUT CONTRAST TECHNIQUE: Multidetector CT imaging of the head and neck was performed using the standard protocol during bolus administration of intravenous contrast. Multiplanar CT image reconstructions and MIPs were obtained to evaluate the vascular anatomy. Carotid stenosis measurements (when applicable) are obtained utilizing NASCET criteria, using the distal internal carotid diameter as the denominator. RADIATION DOSE REDUCTION: This exam was performed according to the departmental dose-optimization program which includes automated exposure control, adjustment of the mA and/or kV according to patient size and/or use of iterative reconstruction technique. CONTRAST:  75mL OMNIPAQUE  IOHEXOL  350 MG/ML SOLN COMPARISON:  None Available. FINDINGS: CT HEAD FINDINGS Brain: Cerebral volume within  normal limits. Moderate chronic microvascular ischemic disease. Chronic right PCA territory infarct. No acute intracranial hemorrhage. Previously noted subcentimeter right cerebral and cerebellar infarcts not visible by CT. No other acute large vessel territory infarct. No mass lesion or midline shift. No hydrocephalus or extra-axial fluid collection. Vascular: No abnormal hyperdense vessel. Calcified atherosclerosis present at the skull base. Skull: Scalp soft tissues demonstrate no acute finding. Calvarium intact. Sinuses/Orbits: Globes orbital soft tissues within normal limits. Paranasal sinuses and mastoid air cells are largely clear. Other: None. Review of the MIP images confirms the above findings CTA NECK FINDINGS Aortic arch: Arch visualized aortic arch within normal limits for caliber. Bovine branching pattern noted. Aortic atherosclerosis. No significant stenosis about the origin the great vessels. Right carotid system: Right common and internal carotid arteries are patent without dissection. Atheromatous change about the right carotid bulb without hemodynamically significant greater than 50% stenosis. Left carotid system: Left common and internal carotid arteries are patent without dissection. Atheromatous change about the left carotid bulb without hemodynamically significant greater than 50% stenosis. Vertebral arteries: Both vertebral arteries arise from subclavian arteries. Left vertebral artery slightly dominant. No hemodynamically significant proximal subclavian artery stenosis. Vertebral arteries are patent without significant stenosis or dissection. Skeleton: No worrisome osseous lesions. Moderately advanced cervical spondylosis. Other neck: No other acute finding. Upper chest: Streak artifact from left-sided pacemaker/AICD, partially visualized. Scattered ground-glass density with interlobular septal thickening within the visualized lungs, suggestive of mild pulmonary interstitial  congestion/edema. Review of the MIP images confirms the above findings CTA HEAD FINDINGS Anterior circulation: Moderate atheromatous change about the carotid siphons without hemodynamically significant stenosis. A1 segments patent bilaterally. Normal anterior communicating artery complex. Both ACAs are mildly irregular but patent without significant stenosis. No M1 stenosis or occlusion. Distal MCA branches perfused and symmetric. Distal small vessel atheromatous irregularity noted. Posterior circulation: Atheromatous change about the dominant left V4 segment without significant stenosis. Left PICA patent. Right V4 segment mildly irregular but patent as well. Right PICA patent at its origin. Atheromatous irregularity throughout the basilar artery without high-grade stenosis. Superior cerebral arteries patent bilaterally. Both PCAs primarily supplied via the basilar. Atheromatous irregularity about the PCAs without proximal  high-grade stenosis. Venous sinuses: Patent allowing for timing the contrast bolus. Anatomic variants: As above.  No aneurysm. Review of the MIP images confirms the above findings IMPRESSION: CT HEAD: 1. No acute intracranial abnormality. Previously identified subcentimeter right cerebral and cerebellar infarcts not visible by CT. 2. Chronic right PCA territory infarct. 3. Underlying moderate chronic microvascular ischemic disease. CTA HEAD AND NECK: 1. Negative CTA for large vessel occlusion or other emergent finding. 2. Atheromatous change about the carotid bifurcations without greater than 50% stenosis. No hemodynamically significant stenosis within the neck. 3. Intracranial atherosclerotic disease, most notable about the carotid siphons and basilar artery. No proximal high-grade or correctable stenosis. 4.  Aortic Atherosclerosis (ICD10-I70.0). 5. Findings of mild pulmonary interstitial congestion/edema within the visualized lungs. Electronically Signed: By: Virgia Griffins M.D. On:  06/23/2023 01:01   DG Humerus Left Result Date: 06/22/2023 CLINICAL DATA:  Shoulder pain EXAM: LEFT HUMERUS - 2+ VIEW COMPARISON:  None Available. FINDINGS: There is no evidence of fracture or other focal bone lesions. Soft tissues are unremarkable. IMPRESSION: Negative. Electronically Signed   By: Esmeralda Hedge M.D.   On: 06/22/2023 22:08   DG Shoulder Left Result Date: 06/22/2023 CLINICAL DATA:  Pole Ng sensation left arm EXAM: LEFT SHOULDER - 2+ VIEW COMPARISON:  None Available. FINDINGS: No fracture or malalignment. Moderate AC joint and glenohumeral degenerative change. Left-sided pacing device IMPRESSION: Moderate degenerative changes. Electronically Signed   By: Esmeralda Hedge M.D.   On: 06/22/2023 22:08   MR BRAIN WO CONTRAST Result Date: 06/22/2023 CLINICAL DATA:  Neuro deficit, acute, stroke suspected. Numbness and speech difficulty. EXAM: MRI HEAD WITHOUT CONTRAST TECHNIQUE: Multiplanar, multiecho pulse sequences of the brain and surrounding structures were obtained without intravenous contrast. COMPARISON:  Head CT 06/22/2023 FINDINGS: Brain: Clustered small foci of mildly restricted diffusion involving posterior right frontal cortex are compatible with acute to early subacute infarcts. A similar subcentimeter focus of mild diffusion weighted signal abnormality is also present in the right cerebellar hemisphere. There is a small chronic infarct in the medial right occipital lobe with associated chronic blood products. Patchy T2 hyperintensities throughout the cerebral white matter bilaterally are nonspecific but compatible with moderate chronic small vessel ischemic disease. Small chronic infarcts are present in the left thalamus and both cerebellar hemispheres. A remote insult is also noted in the body of the corpus callosum. Mild cerebral atrophy is within normal limits for age. The ventricles are normal in size. No mass, midline shift, or extra-axial fluid collection is identified. Vascular:  Major intracranial vascular flow voids are preserved. Skull and upper cervical spine: Unremarkable bone marrow signal. Sinuses/Orbits: Bilateral cataract extraction. Mild mucosal thickening in the ethmoid sinuses. Clear mastoid air cells. Other: None. IMPRESSION: 1. Small acute to early subacute right frontal and right cerebellar infarcts. 2. Moderate chronic small vessel ischemic disease. 3. Multiple chronic infarcts in the posterior circulation. Electronically Signed   By: Aundra Lee M.D.   On: 06/22/2023 16:59   CT HEAD WO CONTRAST Result Date: 06/22/2023 CLINICAL DATA:  Neuro deficit, concern for stroke, tingling/pulling sensation in left arm last night. EXAM: CT HEAD WITHOUT CONTRAST TECHNIQUE: Contiguous axial images were obtained from the base of the skull through the vertex without intravenous contrast. RADIATION DOSE REDUCTION: This exam was performed according to the departmental dose-optimization program which includes automated exposure control, adjustment of the mA and/or kV according to patient size and/or use of iterative reconstruction technique. COMPARISON:  CTA head and neck 05/03/2023. FINDINGS: Brain: No acute intracranial hemorrhage.  Interval evolution of previously noted infarct in the medial right occipital lobe now with chronic appearance. Additional chronic infarct in the posterosuperior right cerebellum. Nonspecific hypoattenuation in the periventricular and subcortical white matter favored to reflect chronic microvascular ischemic changes. No edema, mass effect, or midline shift. Basilar cisterns are patent. No extra-axial fluid collection. Ventricles: The ventricles are normal. Vascular: Atherosclerotic calcifications of the carotid siphons and intracranial vertebral arteries. No hyperdense vessel. Skull: No acute or aggressive finding. Orbits: Bilateral lens replacement. Sinuses: Mild mucosal thickening in the ethmoid and maxillary sinuses. Other: Mastoid air cells are clear.  IMPRESSION: No CT evidence of acute intracranial abnormality. Expected interval evolution of now chronic infarct in the medial right occipital lobe. Additional chronic infarct in the posterior right cerebellum. Chronic microvascular ischemic changes. Electronically Signed   By: Denny Flack M.D.   On: 06/22/2023 09:38     Discharge Instructions: Discharge Instructions     Ambulatory referral to Neurology   Complete by: As directed    An appointment is requested in approximately: 1 week   Ambulatory referral to Occupational Therapy   Complete by: As directed    Ambulatory referral to Physical Therapy   Complete by: As directed    Ambulatory referral to Speech Therapy   Complete by: As directed    Call MD for:  difficulty breathing, headache or visual disturbances   Complete by: As directed    Call MD for:  persistant dizziness or light-headedness   Complete by: As directed    Diet - low sodium heart healthy   Complete by: As directed    Discharge instructions   Complete by: As directed    - Follow-up with your PCP.  - Follow-up with neurology.   Increase activity slowly   Complete by: As directed        Signed: Marni Sins, MD 06/25/2023, 3:41 PM   Pager: [phone]

## 2023-06-25 NOTE — Plan of Care (Signed)

## 2023-06-25 NOTE — Progress Notes (Unsigned)
 No ICM remote transmission received for 06/25/2023 due to currently hospitalized and next ICM transmission scheduled for 07/30/2023.

## 2023-06-25 NOTE — Progress Notes (Addendum)
 Discharge instructions (including medications) discussed with and copy provided to patient. Patient given opportunity to ask questions. Questions clarified. Patient stating he was supposed to get 30 day free supply of eliquis . Will contact case manager to figure this out.

## 2023-06-25 NOTE — Evaluation (Addendum)
 Speech Language Pathology Evaluation Patient Details Name: Phillip Booth MRN: [id number] DOB: May 22, 1950 Today's Date: 06/25/2023 Time: 4098-1191 SLP Time Calculation (min) (ACUTE ONLY): 25 min  Problem List:  Patient Active Problem List   Diagnosis Date Noted   Acute cerebral infarction (HCC) 06/22/2023   Financial difficulties 06/22/2023   Anemia 06/22/2023   Pain in joint, shoulder region 06/22/2023   Acute respiratory failure due to COVID-19 [facility]) 06/14/2022   Medial epicondylitis, right elbow 04/12/2020   Cervical strain 03/25/2020   Cervical radiculopathy 03/25/2020   Contusion of left knee 03/25/2020   Atrial fibrillation (HCC) 06/25/2014   Essential hypertension 05/25/2014   History of urinary tract obstruction 04/14/2014   Erectile dysfunction 04/14/2014   Non-ischemic cardiomyopathy (HCC) 04/03/2014   Coronary artery disease involving native coronary artery 04/03/2014   Second degree heart block    Pleural effusion, right 03/18/2014   Bradycardia 03/18/2014   NSTEMI (non-ST elevated myocardial infarction) [facility])    Past Medical History:  Past Medical History:  Diagnosis Date   Allergy    Anemia 06/22/2023   CAD (coronary artery disease)    a. moderate by cath 03/2014   Cataract    Cervical strain 03/25/2020   CVA (cerebral infarction)    a. diagnosis not clear   Hyperlipidemia    Hypertension    Non-ischemic cardiomyopathy (HCC)    a. EF 45% by echo 03/2014   Paroxysmal atrial fibrillation (HCC) 05/01/2014   asymptomatic, documented on PPM interrogation,  chads2vasc score is at least 6.  He is contemplating anticoagulation   Symptomatic bradycardia    a. s/p STJ CRTP implanted by Dr Nunzio Belch   Vertigo    Past Surgical History:  Past Surgical History:  Procedure Laterality Date   ABDOMINAL SURGERY     APPENDECTOMY     BI-VENTRICULAR PACEMAKER INSERTION N/A 03/19/2014   STJ CRTP implanted by Dr Nunzio Belch   EYE SURGERY     Cataract removed from Right eye   HERNIA  REPAIR     LEFT HEART CATHETERIZATION WITH CORONARY ANGIOGRAM N/A 03/19/2014   Procedure: LEFT HEART CATHETERIZATION WITH CORONARY ANGIOGRAM;  Surgeon: Millicent Ally, MD;  Location: [facility] CATH LAB;  Service: Cardiovascular;  Laterality: N/A;   PPM GENERATOR CHANGEOUT N/A 12/07/2021   Procedure: PPM GENERATOR CHANGEOUT;  Surgeon: Arlester Ladd, Donnamae Gaba, MD;  Location: [facility] INVASIVE CV LAB;  Service: Cardiovascular;  Laterality: N/A;   HPI:  Phillip Booth is a 73 y.o. male with PMH of DVT, HTN, HLD, CAD, cerebral infarction, paroxysmal Afib, non-ischemic cardiomyopathy, and symptomatic bradycardia s/p STJ CRTP who presented to the ED with left arm pain and numbness. At around 3 am this morning, patient endorsed left arm numbness. At first, he thought that it could have been a strained muscle or from fan that was blowing on the side of the bed. Patient states it began to hurt and ache, described the pain as a burning pain. Also endorses tingling and weakness. Patient also endorses pain in left arm on exertion along with dizziness. Pain and weakness is still present when left arm is used. Reports decreased finger dexterity. Denies any trauma to left upper extremity. Denies chest pain, SOB. Denies left leg numbness, weakness, tingling, or pain. Patient also noticed later this morning that he was having trouble formulating words. Patient thought about what he wanted to say, but could not get the words out. Endorsed confusion, but was able to comprehend things that were said to him. Denied headache, vision changes, or memory  loss. 06/22/23 RI revealed  Small acute to early subacute right frontal and right cerebellar infarcts. 2. Moderate chronic small vessel ischemic disease. 3. Multiple chronic infarcts in the posterior circulation. ST consulted for speech/language cognitive assessment.   Assessment / Plan / Recommendation Clinical Impression  Pt administered [employer] Mental Status Examination (SLUMS)  with an overall score of 13/30 obtained, but pt voiced concerns with aphasia/word finding when assessment initiated.  No family available to determine prior level of functioning re: cognition.  Pt was Ox4, recalled 2/5 objects after a time delay and 4/5 with category cues provided, but perseveration noted throughout assessment.  Naming tasks proved difficult with word fluency and convergent naming tasks, but responsive simple naming task [facility].   Clock formation impacted by interfering factors including decreased sustained attention and potential visual/spatial deficits (pt did not have glasses available, but wanted to comply with task).  25% accuracy obtained with recall of a paragraph. Simple calculation task was 50% accurate.  Pt did state he was planning to move in with family to [place] him and he is no longer driving, so anticipatory awareness appears intact, but he gave vague answers when asked reasoning/problem solving tasks, so further assessment within these areas may be warranted.  Pt leaned to right/backward during assessment despite cues to reposition self upright given (with slight improvement, but pt returned to prior positioning), so body awareness questionable.  Pt transposed digits when asked to repeat backwards past 2 digits.  Observations and portions of Western Aphasia Battery given with naming impacted, but pt able to follow 2-3 step directives.  Further cognitive/linguistic assessment may be indicated if symptoms persist.  ST will f/u during acute stay for cognitive/linguistic changes and further assessment prn once baseline functioning established.  Thank you for this consult.  SLP Assessment  SLP Recommendation/Assessment: Patient needs continued Speech Language Pathology Services SLP Visit Diagnosis: Cognitive communication deficit (R41.841)     Assistance Recommended at Discharge  Other (comment) (TBD)  Functional Status Assessment Patient has had a recent decline in their functional  status and demonstrates the ability to make significant improvements in function in a reasonable and predictable amount of time.  Frequency and Duration min 2x/week  1 week      SLP Evaluation Cognition  Overall Cognitive Status: No family/caregiver present to determine baseline cognitive functioning Arousal/Alertness: Awake/alert Orientation Level: Oriented X4 Attention: Sustained Sustained Attention: Impaired Sustained Attention Impairment: Verbal basic;Functional basic Memory: Impaired Memory Impairment: Storage deficit;Decreased recall of new information;Decreased short term memory Decreased Short Term Memory: Verbal basic;Functional basic Awareness: Impaired Awareness Impairment: Anticipatory impairment Problem Solving: Impaired Problem Solving Impairment: Verbal basic;Functional basic Behaviors: Perseveration Safety/Judgment: Other (comment) (DTA) Comments: Is no longer driving, but awareness fluctuating       Comprehension  Auditory Comprehension Overall Auditory Comprehension: Appears within functional limits for tasks assessed Yes/No Questions: Within Functional Limits Conversation: Simple Other Conversation Comments: perseveration/vague answers Interfering Components: Attention;Working Radio broadcast assistant: Repetition Counsellor: Exceptions to [employer] Reading Comprehension Reading Status: Not tested    Expression Expression Primary Mode of Expression: Verbal Verbal Expression Overall Verbal Expression: Appears within functional limits for tasks assessed Level of Generative/Spontaneous Verbalization: Conversation Naming: Impairment Responsive: 76-100% accurate Confrontation: Not tested Convergent: 50-74% accurate Divergent: 50-74% accurate Other Naming Comments: perseveration Verbal Errors: Perseveration Pragmatics: No impairment Interfering Components: Attention;Premorbid deficit Non-Verbal Means of Communication:  Not applicable Written Expression Dominant Hand: Right Written Expression: Exceptions to [facility] Interfering Components: Thought organization;Spatial organization;Attention   Oral / Motor  Oral Motor/Sensory Function Overall Oral Motor/Sensory Function: Within functional limits Motor Speech Overall Motor Speech: Appears within functional limits for tasks assessed Respiration: Within functional limits Phonation: Normal Resonance: Within functional limits Articulation: Within functional limitis Intelligibility: Intelligible Motor Planning: Within functional limits Motor Speech Errors: Not applicable            Pat Satvik Parco,M.S.,CCC-SLP 06/25/2023, 1:58 PM

## 2023-06-25 NOTE — TOC CAGE-AID Note (Signed)
 Transition of Care [facility]) - CAGE-AID Screening   Patient Details  Name: Zyire Eidson MRN: [id number] Date of Birth: 1950/07/23  Transition of Care [facility]) CM/SW Contact:    Jeter Tomey E An Lannan, LCSW Phone Number: 06/25/2023, 12:27 PM   Clinical Narrative: Disoriented to situation No SA noted   CAGE-AID Screening: Substance Abuse Screening unable to be completed due to: : Patient unable to participate

## 2023-06-25 NOTE — Progress Notes (Signed)
 Patients ride home will not be able to pick him up until 7:00 pm; per pt.

## 2023-06-25 NOTE — TOC Transition Note (Addendum)
 Transition of Care [facility]) - Discharge Note   Patient Details  Name: Phillip Booth MRN: [id number] Date of Birth: 01/13/1950  [facility]) CM/SW Contact:  Jonathan Neighbor, RN Phone Number: 06/25/2023, 2:30 PM   Clinical Narrative:     Pt is discharging home with his friend. CM spoke to friend over the phone and he is able to assist pt with his medications at home.  Pt doesn't drive anymore but has several friends that can assist him getting to outpatient therapy. Outpatient therapy arranged with [facility] outpatient therapy. Information on the AVS.  Eliquis  to be filled through [facility] pharmacy and covered by 30 day free card.  Pt has transportation home.  Final next level of care: OP Rehab Barriers to Discharge: No Barriers Identified   Patient Goals and CMS Choice     Choice offered to / list presented to : Patient      Discharge Placement                       Discharge Plan and Services Additional resources added to the After Visit Summary for                                       Social Drivers of Health (SDOH) Interventions SDOH Screenings   Food Insecurity: No Food Insecurity (06/22/2023)  Housing: Unknown (06/22/2023)  Transportation Needs: No Transportation Needs (06/22/2023)  Utilities: Not At Risk (06/22/2023)  Depression (PHQ2-9): Low Risk  (06/17/2019)  Social Connections: Socially Integrated (06/22/2023)  Tobacco Use: Low Risk  (06/22/2023)     Readmission Risk Interventions     No data to display

## 2023-06-25 NOTE — Progress Notes (Signed)
 Physical Therapy Treatment Patient Details Name: Phillip Booth MRN: [id number] DOB: [birth year] Today's Date: 06/25/2023   History of Present Illness Pt is a 73 y.o. male who presented to the ED 6/13 withc/o LUE numbness. 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 PMH:  CHF, s/p PPM, PE, DVT, vertigo, HTN, CVA, CAD, and Afib    PT Comments  Patient with difficulty initially with speed changes for DGI though improved with time and more cues, still with deficits with head turns due to turning body more scored 21/24 with low fall risk.  He was also challenged trying to name books of the Bible during ambulation despite stating usually very accurate.  Feel stable for home with supervision for all IADL's and follow up outpatient PT.     If plan is discharge home, recommend the following: Assist for transportation   Can travel by private vehicle        Equipment Recommendations       Recommendations for Other Services       Precautions / Restrictions Precautions Precautions: Fall Recall of Precautions/Restrictions: Impaired Precaution/Restrictions Comments: L field cut     Mobility  Bed Mobility Overal bed mobility: Independent                  Transfers Overall transfer level: Independent Equipment used: None                    Ambulation/Gait Ambulation/Gait assistance: Supervision Gait Distance (Feet): 250 Feet Assistive device: None Gait Pattern/deviations: Step-through pattern, Drifts right/left       General Gait Details: see DGI   Stairs             Wheelchair Mobility     Tilt Bed    Modified Rankin (Stroke Patients Only) Modified Rankin (Stroke Patients Only) Pre-Morbid Rankin Score: No symptoms Modified Rankin: Moderate disability     Balance Overall balance assessment: Needs assistance Sitting-balance support: No upper extremity supported, Feet supported Sitting balance-Leahy Scale: Normal     Standing balance  support: No upper extremity supported, During functional activity Standing balance-Leahy Scale: Good                   Standardized Balance Assessment Standardized Balance Assessment : Dynamic Gait Index   Dynamic Gait Index Level Surface: Normal Change in Gait Speed: Mild Impairment Gait with Horizontal Head Turns: Mild Impairment Gait with Vertical Head Turns: Normal Gait and Pivot Turn: Normal Step Over Obstacle: Normal Step Around Obstacles: Normal Steps: Mild Impairment Total Score: 21      Communication Communication Communication: No apparent difficulties  Cognition Arousal: Alert Behavior During Therapy: WFL for tasks assessed/performed   PT - Cognitive impairments: Memory, Problem solving                       PT - Cognition Comments: slow processing, decreased recall of books of the Bible, but also possibly word finding difficulty Following commands: Intact      Cueing Cueing Techniques: Verbal cues  Exercises      General Comments General comments (skin integrity, edema, etc.): walking in hallway; confusion regarding looking to sides and veering some due to turning body, not just head, unable to recall books of Bible consistently during ambulation despite pt stating he is usually very knowledgeable about the Bible.  Stated Genesis then [place] then with cues for new testament Phillip Booth, Phillip Booth and Phillip Booth,  Then unable to state further.  Recognized  his deficits and eager for outpatient therapy.      Pertinent Vitals/Pain Pain Assessment Pain Assessment: No/denies pain    Home Living     Available Help at Discharge: Family;Friend(s);Available PRN/intermittently                    Prior Function            PT Goals (current goals can now be found in the care plan section) Progress towards PT goals: Progressing toward goals    Frequency    Min 3X/week      PT Plan      Co-evaluation              AM-PAC PT 6  Clicks Mobility   Outcome Measure  Help needed turning from your back to your side while in a flat bed without using bedrails?: None Help needed moving from lying on your back to sitting on the side of a flat bed without using bedrails?: None Help needed moving to and from a bed to a chair (including a wheelchair)?: None Help needed standing up from a chair using your arms (e.g., wheelchair or bedside chair)?: None Help needed to walk in hospital room?: None Help needed climbing 3-5 steps with a railing? : A Little 6 Click Score: 23    End of Session Equipment Utilized During Treatment: Gait belt Activity Tolerance: Patient tolerated treatment well Patient left: in bed   PT Visit Diagnosis: Other symptoms and signs involving the nervous system (R29.898)     Time: 8469-6295 PT Time Calculation (min) (ACUTE ONLY): 20 min  Charges:    $Gait Training: 8-22 mins PT General Charges $$ ACUTE PT VISIT: 1 Visit                     Abigail Hoff, PT Acute Rehabilitation Services Office:[phone] 06/25/2023    Phillip Booth 06/25/2023, 4:17 PM

## 2023-06-25 NOTE — Plan of Care (Signed)
 Problem: Education: Goal: Knowledge of disease or condition will improve 06/25/2023 1416 by Meridith Stanford, RN Outcome: Adequate for Discharge 06/25/2023 1228 by Meridith Stanford, RN Outcome: Progressing Goal: Knowledge of secondary prevention will improve (MUST DOCUMENT ALL) 06/25/2023 1416 by Meridith Stanford, RN Outcome: Adequate for Discharge 06/25/2023 1228 by Meridith Stanford, RN Outcome: Progressing Goal: Knowledge of patient specific risk factors will improve (DELETE if not current risk factor) 06/25/2023 1416 by Meridith Stanford, RN Outcome: Adequate for Discharge 06/25/2023 1228 by Meridith Stanford, RN Outcome: Progressing   Problem: Ischemic Stroke/TIA Tissue Perfusion: Goal: Complications of ischemic stroke/TIA will be minimized 06/25/2023 1416 by Meridith Stanford, RN Outcome: Adequate for Discharge 06/25/2023 1228 by Meridith Stanford, RN Outcome: Progressing   Problem: Coping: Goal: Will verbalize positive feelings about self 06/25/2023 1416 by Meridith Stanford, RN Outcome: Adequate for Discharge 06/25/2023 1228 by Meridith Stanford, RN Outcome: Progressing Goal: Will identify appropriate support needs 06/25/2023 1416 by Meridith Stanford, RN Outcome: Adequate for Discharge 06/25/2023 1228 by Meridith Stanford, RN Outcome: Progressing   Problem: Health Behavior/Discharge Planning: Goal: Ability to manage health-related needs will improve 06/25/2023 1416 by Meridith Stanford, RN Outcome: Adequate for Discharge 06/25/2023 1228 by Meridith Stanford, RN Outcome: Progressing Goal: Goals will be collaboratively established with patient/family 06/25/2023 1416 by Meridith Stanford, RN Outcome: Adequate for Discharge 06/25/2023 1228 by Meridith Stanford, RN Outcome: Progressing   Problem: Self-Care: Goal: Ability to participate in self-care as condition permits will improve 06/25/2023 1416 by Meridith Stanford, RN Outcome: Adequate  for Discharge 06/25/2023 1228 by Meridith Stanford, RN Outcome: Progressing Goal: Verbalization of feelings and concerns over difficulty with self-care will improve 06/25/2023 1416 by Meridith Stanford, RN Outcome: Adequate for Discharge 06/25/2023 1228 by Meridith Stanford, RN Outcome: Progressing Goal: Ability to communicate needs accurately will improve 06/25/2023 1416 by Meridith Stanford, RN Outcome: Adequate for Discharge 06/25/2023 1228 by Meridith Stanford, RN Outcome: Progressing   Problem: Nutrition: Goal: Risk of aspiration will decrease 06/25/2023 1416 by Meridith Stanford, RN Outcome: Adequate for Discharge 06/25/2023 1228 by Meridith Stanford, RN Outcome: Progressing Goal: Dietary intake will improve 06/25/2023 1416 by Meridith Stanford, RN Outcome: Adequate for Discharge 06/25/2023 1228 by Meridith Stanford, RN Outcome: Progressing   Problem: Education: Goal: Knowledge of General Education information will improve Description: Including pain rating scale, medication(s)/side effects and non-pharmacologic comfort measures 06/25/2023 1416 by Meridith Stanford, RN Outcome: Adequate for Discharge 06/25/2023 1228 by Meridith Stanford, RN Outcome: Progressing   Problem: Health Behavior/Discharge Planning: Goal: Ability to manage health-related needs will improve 06/25/2023 1416 by Meridith Stanford, RN Outcome: Adequate for Discharge 06/25/2023 1228 by Meridith Stanford, RN Outcome: Progressing   Problem: Clinical Measurements: Goal: Ability to maintain clinical measurements within normal limits will improve 06/25/2023 1416 by Meridith Stanford, RN Outcome: Adequate for Discharge 06/25/2023 1228 by Meridith Stanford, RN Outcome: Progressing Goal: Will remain free from infection 06/25/2023 1416 by Meridith Stanford, RN Outcome: Adequate for Discharge 06/25/2023 1228 by Meridith Stanford, RN Outcome: Progressing Goal: Diagnostic test results  will improve 06/25/2023 1416 by Meridith Stanford, RN Outcome: Adequate for Discharge 06/25/2023 1228 by Meridith Stanford, RN Outcome: Progressing Goal: Respiratory complications will improve 06/25/2023 1416 by Meridith Stanford, RN Outcome: Adequate for Discharge 06/25/2023 1228 by Meridith Stanford, RN Outcome: Progressing Goal: Cardiovascular complication will be avoided 06/25/2023 1416 by  Meridith Stanford, RN Outcome: Adequate for Discharge 06/25/2023 1228 by Meridith Stanford, RN Outcome: Progressing   Problem: Activity: Goal: Risk for activity intolerance will decrease 06/25/2023 1416 by Meridith Stanford, RN Outcome: Adequate for Discharge 06/25/2023 1228 by Meridith Stanford, RN Outcome: Progressing   Problem: Nutrition: Goal: Adequate nutrition will be maintained 06/25/2023 1416 by Meridith Stanford, RN Outcome: Adequate for Discharge 06/25/2023 1228 by Meridith Stanford, RN Outcome: Progressing   Problem: Coping: Goal: Level of anxiety will decrease 06/25/2023 1416 by Meridith Stanford, RN Outcome: Adequate for Discharge 06/25/2023 1228 by Meridith Stanford, RN Outcome: Progressing   Problem: Elimination: Goal: Will not experience complications related to bowel motility 06/25/2023 1416 by Meridith Stanford, RN Outcome: Adequate for Discharge 06/25/2023 1228 by Meridith Stanford, RN Outcome: Progressing Goal: Will not experience complications related to urinary retention 06/25/2023 1416 by Meridith Stanford, RN Outcome: Adequate for Discharge 06/25/2023 1228 by Meridith Stanford, RN Outcome: Progressing   Problem: Pain Managment: Goal: General experience of comfort will improve and/or be controlled 06/25/2023 1416 by Meridith Stanford, RN Outcome: Adequate for Discharge 06/25/2023 1228 by Meridith Stanford, RN Outcome: Progressing   Problem: Safety: Goal: Ability to remain free from injury will improve 06/25/2023 1416 by Meridith Stanford, RN Outcome: Adequate for Discharge 06/25/2023 1228 by Meridith Stanford, RN Outcome: Progressing   Problem: Skin Integrity: Goal: Risk for impaired skin integrity will decrease 06/25/2023 1416 by Meridith Stanford, RN Outcome: Adequate for Discharge 06/25/2023 1228 by Meridith Stanford, RN Outcome: Progressing   Problem: Acute Rehab PT Goals(only PT should resolve) Goal: Patient Will Transfer Sit To/From Stand Outcome: Adequate for Discharge Goal: Pt Will Ambulate Outcome: Adequate for Discharge Goal: Pt Will Go Up/Down Stairs Outcome: Adequate for Discharge   Problem: Acute Rehab OT Goals (only OT should resolve) Goal: Pt. Will Perform Grooming Outcome: Adequate for Discharge Goal: Pt. Will Transfer To Toilet Outcome: Adequate for Discharge Goal: Pt. Will Perform Tub/Shower Transfer Outcome: Adequate for Discharge Goal: OT Additional ADL Goal #4 Outcome: Adequate for Discharge   Problem: SLP Cognition Goals Goal: Patient will demonstrate attention to functional Description: Patient will demonstrate attention to functional task with Outcome: Adequate for Discharge Goal: Patient will utilize external memory aids Description: Patient will utilize external memory aids to facilitate recall of information for improved safety with Outcome: Adequate for Discharge Goal: Patient will demonstrate problem solving skills Description: Patient will demonstrate problem solving skills during functional ADL's with Outcome: Adequate for Discharge Goal: Patient will demonstrate awareness during Description: Patient will demonstrate awareness during functional ADL for improved safety Outcome: Adequate for Discharge

## 2023-06-26 ENCOUNTER — Ambulatory Visit: Attending: Cardiovascular Disease

## 2023-06-26 DIAGNOSIS — Z95810 Presence of automatic (implantable) cardiac defibrillator: Secondary | ICD-10-CM

## 2023-06-26 DIAGNOSIS — I5042 Chronic combined systolic (congestive) and diastolic (congestive) heart failure: Secondary | ICD-10-CM | POA: Diagnosis not present

## 2023-06-29 NOTE — Progress Notes (Signed)
 EPIC Encounter for ICM Monitoring  Patient Name: Phillip Booth is a 73 y.o. male Date: 06/29/2023 Primary Care Physican: Myrl Askew, DO Primary Cardiologist: Renna Cary Electrophysiologist: Mealor Bi-V Pacing:  >99%      03/13/2022 Weight: 219 lbs 11/22/2022 Weight:  221 lbs 03/13/2023 Weight: unknown 03/21/2023 Weight: 219 lbs 06/29/2023 Weight: 212-219 lbs   AT/AF Burden: <1%    Spoke with patient and heart failure questions reviewed.  Transmission results reviewed.  Pt asymptomatic for fluid accumulation.  He was hospitalized for a stroke on 6/13 and started on Eliquis .  Per ED visit note, he did complete 3 months of eliquis  after he was diagnosed with a PE and a DVT in 2024.    Corvue thoracic impedance suggesting intermittent days with possible fluid accumulation from 5/31-6/10.  Message sent to device clinic nurse to review AT/AF burden and recommended patient see EP APP in the next 2 weeks and EP scheduler will try to call patient.     Prescribed: Furosemide  20 mg 1 tablet daily   Labs: 06/24/2023 Creatinine 0.76, BUN 11, Potassium 3.8, Sodium 137, GFR >60 06/22/2023 Creatinine 0.84, BUN 19, Potassium 4.0, Sodium 141, GFR >60 05/03/2023 Creatinine 0.96, BUN 19, Potassium 4.0, Sodium 139, GFR >60, BNP 115.4 A complete set of results can be found in Results Review.   Recommendations:   No changes and encouraged to call if experiencing any fluid symptoms.  Advised to call offices of Dr Skain and Dr Mealor for overdue appointments.     Follow-up plan: ICM clinic phone appointment on 08/13/2023.   91 day device clinic remote transmission 09/04/2023.     EP/Cardiology Office Visits:  Recall 08/27/2022 with Dr Renna Cary (last visit 08/12/2021 with Narvis Bad PA).  Recall 03/08/2023 with Dr Arlester Ladd.    Copy of ICM check sent to Dr. Arlester Ladd.   3 month ICM trend: 06/25/2023.    12-14 Month ICM trend:     Almyra Jain, RN 06/29/2023 12:22 PM

## 2023-07-16 ENCOUNTER — Ambulatory Visit: Attending: Cardiovascular Disease | Admitting: Cardiovascular Disease

## 2023-07-17 ENCOUNTER — Encounter: Payer: Self-pay | Admitting: Cardiovascular Disease

## 2023-07-23 NOTE — Progress Notes (Signed)
 Remote pacemaker transmission.

## 2023-07-23 NOTE — Addendum Note (Signed)
 Addended by: TAWNI DRILLING D on: 07/23/2023 09:35 AM   Modules accepted: Orders

## 2023-07-30 ENCOUNTER — Encounter

## 2023-08-07 NOTE — Progress Notes (Deleted)
  Cardiology Office Note:  .   Date:  08/07/2023  ID:  Phillip Booth, DOB October 07, 1950, MRN [id number] PCP: Zena Frederick, DO  [facility] Providers Cardiologist:  Oneil Parchment, MD Cardiology APP:  Kathrine Jeoffrey POUR, NP (Inactive)  Electrophysiologist:  Eulas FORBES Furbish, MD { Click to update primary MD,subspecialty MD or APP then REFRESH:1}   History of Present Illness: .   Phillip Booth is a 73 y.o. 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 Couldn't afford eliquis  and refused coumadin. Eliquis  restarted with patient assistance form.  Patient had loop recorder implanted 08/08/23.   ROS: ***  Studies Reviewed: SABRA         Prior CV Studies: {Select studies to display:26339}  ***  Risk Assessment/Calculations:   {Does this patient have ATRIAL FIBRILLATION?[phone]} No BP recorded.  {Refresh Note OR Click here to enter BP  :1}***       Physical Exam:   VS:  There were no vitals taken for this visit.   Orhtostatics: No data found. Wt Readings from Last 3 Encounters:  06/22/23 230 lb (104.3 kg)  02/20/23 225 lb (102.1 kg)  06/15/22 217 lb 14.8 oz (98.9 kg)    GEN: Well nourished, well developed in no acute distress NECK: No JVD; No carotid bruits CARDIAC: ***RRR, no murmurs, rubs, gallops RESPIRATORY:  Clear to auscultation without rales, wheezing or rhonchi  ABDOMEN: Soft, non-tender, non-distended EXTREMITIES:  No edema; No deformity   ASSESSMENT AND PLAN: .    Symptomatic bradycardia: Abbott BiV pacemaker: normal function. There was a brief episode of AMS due to noise on the atrial lead, lasting 8 seconds. Continue to monitor. Nonischemic cardiomyopathy with moderately reduced EF: Continue lisinopril  40 mg daily, carvedilol  6.25 mg Paroxysmal AF: detected on device interrogation. Continue eliquis  for secondary  hypercoagulable state.       {Are you ordering a CV Procedure (e.g. stress test, cath, DCCV, TEE, etc)?   Press F2        :[id number]}  Dispo: ***  Signed, Olivia Pavy, PA-C

## 2023-08-08 ENCOUNTER — Ambulatory Visit: Attending: Cardiology | Admitting: Cardiovascular Disease

## 2023-08-08 ENCOUNTER — Encounter: Payer: Self-pay | Admitting: Cardiovascular Disease

## 2023-08-08 VITALS — BP 90/58 | HR 60 | Ht 76.0 in | Wt 201.2 lb

## 2023-08-08 DIAGNOSIS — R001 Bradycardia, unspecified: Secondary | ICD-10-CM | POA: Diagnosis not present

## 2023-08-08 DIAGNOSIS — I428 Other cardiomyopathies: Secondary | ICD-10-CM

## 2023-08-08 DIAGNOSIS — I441 Atrioventricular block, second degree: Secondary | ICD-10-CM | POA: Diagnosis not present

## 2023-08-08 DIAGNOSIS — I480 Paroxysmal atrial fibrillation: Secondary | ICD-10-CM | POA: Diagnosis not present

## 2023-08-08 LAB — CUP PACEART INCLINIC DEVICE CHECK
Battery Remaining Longevity: 68 mo
Battery Voltage: 3.01 V
Brady Statistic RA Percent Paced: 98 %
Brady Statistic RV Percent Paced: 99.96 %
Date Time Interrogation Session: 20250730151133
Implantable Lead Connection Status: 753985
Implantable Lead Connection Status: 753985
Implantable Lead Connection Status: 753985
Implantable Lead Implant Date: 20160310
Implantable Lead Implant Date: 20160310
Implantable Lead Implant Date: 20160310
Implantable Lead Location: 753858
Implantable Lead Location: 753859
Implantable Lead Location: 753860
Implantable Lead Model: 1948
Implantable Pulse Generator Implant Date: 20231129
Lead Channel Impedance Value: 387.5 Ohm
Lead Channel Impedance Value: 525 Ohm
Lead Channel Impedance Value: 725 Ohm
Lead Channel Pacing Threshold Amplitude: 0.5 V
Lead Channel Pacing Threshold Amplitude: 0.5 V
Lead Channel Pacing Threshold Amplitude: 0.75 V
Lead Channel Pacing Threshold Amplitude: 0.75 V
Lead Channel Pacing Threshold Amplitude: 1 V
Lead Channel Pacing Threshold Amplitude: 1 V
Lead Channel Pacing Threshold Pulse Width: 0.4 ms
Lead Channel Pacing Threshold Pulse Width: 0.4 ms
Lead Channel Pacing Threshold Pulse Width: 0.4 ms
Lead Channel Pacing Threshold Pulse Width: 0.4 ms
Lead Channel Pacing Threshold Pulse Width: 0.4 ms
Lead Channel Pacing Threshold Pulse Width: 0.4 ms
Lead Channel Sensing Intrinsic Amplitude: 1.8 mV
Lead Channel Sensing Intrinsic Amplitude: 12 mV
Lead Channel Setting Pacing Amplitude: 2 V
Lead Channel Setting Pacing Amplitude: 2.5 V
Lead Channel Setting Pacing Amplitude: 2.5 V
Lead Channel Setting Pacing Pulse Width: 0.4 ms
Lead Channel Setting Pacing Pulse Width: 0.4 ms
Lead Channel Setting Sensing Sensitivity: 4 mV
Pulse Gen Model: 3562
Pulse Gen Serial Number: 8124783

## 2023-08-08 NOTE — Progress Notes (Signed)
 Cardiology Office Note:    Date:  08/08/2023   ID:  Adonijah Baena, DOB [birth year], MRN [id number]  PCP:  Zena Frederick, DO   [facility] Providers Cardiologist:  Oneil Parchment, MD Cardiology APP:  Kathrine Jeoffrey POUR, NP (Inactive)  Electrophysiologist:  Eulas FORBES Furbish, MD     Referring MD: Zena Frederick, DO    History of Present Illness:    Majed Pellegrin is a 73 y.o. male with a hx of CAD, non-ischemic cardiomyopathy, paroxysmal AF, symptomatic bradycardia due to sinus node dysfunction and AV block.  He has an Abbott BiV pacemaker placed by Dr. Kelsie. The generator was changed in November, 2023.  he has no device related complaints -- no new tenderness, drainage, redness.     EKGs/Labs/Other Studies Reviewed:     10.7.2022 EF 45-50%  EKG:  Last EKG results: EF 45-50%  Device was interrogated in clinic today. I reviewed the interrogation. See Paceart for details.  Recent Labs: 05/03/2023: B Natriuretic Peptide 115.4 06/22/2023: ALT 14 06/24/2023: BUN 11; Creatinine, Ser 0.76; Hemoglobin 13.3; Magnesium 2.0; Platelets 182; Potassium 3.8; Sodium 137     Physical Exam:    VS:  BP (!) 90/58 (BP Location: Right Arm, Patient Position: Sitting, Cuff Size: Large)   Pulse 60   Ht 6' 4 (1.93 m)   Wt 201 lb 3.2 oz (91.3 kg)   SpO2 98%   BMI 24.49 kg/m     Wt Readings from Last 3 Encounters:  08/08/23 201 lb 3.2 oz (91.3 kg)  06/22/23 230 lb (104.3 kg)  02/20/23 225 lb (102.1 kg)     GEN:  Well nourished, well developed in no acute distress CARDIAC: RRR, no murmurs, rubs, gallops RESPIRATORY:  Normal work of breathing MUSCULOSKELETAL: no edema    ASSESSMENT & PLAN:    Symptomatic bradycardia: Abbott BiV pacemaker: normal function. There was a brief episode of AMS due to noise on the atrial lead, lasting 8 seconds. Continue to monitor. Nonischemic cardiomyopathy with moderately reduced EF: Continue lisinopril  40 mg daily, carvedilol  6.25 mg Paroxysmal AF:  detected on device interrogation. Continue eliquis  for secondary hypercoagulable state.        PREPROCEDURE DIAGNOSIS:  Syncope    POSTPROCEDURE DIAGNOSIS: Syncope     PROCEDURES:   1. Implantable loop recorder implantation    INTRODUCTION:  Lenny Fiumara presents with a history of syncope The costs of loop recorder monitoring have been discussed with the patient.    DESCRIPTION OF PROCEDURE:  Informed written consent was obtained.  A timeout was performed. The patient required no sedation for the procedure today. The patients left chest was prepped and draped in the usual sterile fashion. The skin overlying the left parasternal region was infiltrated with lidocaine  for local analgesia.  A 0.5-cm incision was made over the left parasternal region over the 3rd intercostal space.  A subcutaneous ILR pocket was fashioned using a combination of sharp and blunt dissection.  A Med[employer]onic Reveal LINQ 2 SN [id number] Gimplantable loop recorder was then placed into the pocket  R waves were very prominent and measured >0.51mV.  Steri- Strips and a sterile dressing were then applied.  There were no early apparent complications.     CONCLUSIONS:   1. Successful implantation of a implantable loop recorder for a history of syncope  2. No early apparent complications.   Eulas FORBES Furbish, MD  Cardiac Electrophysiology     Signed, Eulas FORBES Furbish, MD  08/08/2023 3:23 PM     [facility]

## 2023-08-08 NOTE — Patient Instructions (Addendum)

## 2023-08-13 ENCOUNTER — Ambulatory Visit: Attending: Cardiovascular Disease

## 2023-08-13 ENCOUNTER — Ambulatory Visit: Admitting: Physician Assistant

## 2023-08-13 DIAGNOSIS — Z95810 Presence of automatic (implantable) cardiac defibrillator: Secondary | ICD-10-CM | POA: Diagnosis not present

## 2023-08-13 DIAGNOSIS — I5042 Chronic combined systolic (congestive) and diastolic (congestive) heart failure: Secondary | ICD-10-CM

## 2023-08-13 NOTE — Progress Notes (Unsigned)
 EPIC Encounter for ICM Monitoring  Patient Name: Phillip Booth is a 73 y.o. male Date: 08/13/2023 Primary Care Physican: Zena Frederick, DO Primary Cardiologist: Jeffrie Electrophysiologist: Mealor Bi-V Pacing:  >99%      03/13/2022 Weight: 219 lbs 11/22/2022 Weight:  221 lbs 03/13/2023 Weight: unknown 03/21/2023 Weight: 219 lbs 06/29/2023 Weight: 212-219 lbs   AT/AF Burden: <1%    Attempted call to patient and unable to reach. Transmission results reviewed.    Corvue thoracic impedance suggesting normal fluid levels.     Prescribed: Furosemide  20 mg 1 tablet daily   Labs: 06/24/2023 Creatinine 0.76, BUN 11, Potassium 3.8, Sodium 137, GFR >60 06/22/2023 Creatinine 0.84, BUN 19, Potassium 4.0, Sodium 141, GFR >60 05/03/2023 Creatinine 0.96, BUN 19, Potassium 4.0, Sodium 139, GFR >60, BNP 115.4 A complete set of results can be found in Results Review.   Recommendations:  Unable to reach.      Follow-up plan: ICM clinic phone appointment on 09/17/2023.   91 day device clinic remote transmission 09/04/2023.     EP/Cardiology Office Visits: 08/13/2023 with Olivia Pavy, PA.  Recall 08/02/2024 with Dr Nancey.    Copy of ICM check sent to Dr. Nancey.   3 month ICM trend: 08/13/2023.    12-14 Month ICM trend:     Mitzie GORMAN Garner, RN 08/13/2023 7:09 AM

## 2023-08-14 ENCOUNTER — Telehealth: Payer: Self-pay

## 2023-08-14 NOTE — Telephone Encounter (Signed)
 Remote ICM transmission received.  Attempted call to patient regarding ICM remote transmission and no answer.

## 2023-08-15 ENCOUNTER — Encounter: Admitting: Cardiovascular Disease

## 2023-09-04 ENCOUNTER — Ambulatory Visit (INDEPENDENT_AMBULATORY_CARE_PROVIDER_SITE_OTHER): Payer: Medicare Other

## 2023-09-04 DIAGNOSIS — R001 Bradycardia, unspecified: Secondary | ICD-10-CM | POA: Diagnosis not present

## 2023-09-05 LAB — CUP PACEART REMOTE DEVICE CHECK
Battery Remaining Longevity: 72 mo
Battery Remaining Percentage: 80 %
Battery Voltage: 3.01 V
Brady Statistic AP VP Percent: 98 %
Brady Statistic AP VS Percent: 1 %
Brady Statistic AS VP Percent: 1.7 %
Brady Statistic AS VS Percent: 1 %
Brady Statistic RA Percent Paced: 98 %
Date Time Interrogation Session: 20250826020027
Implantable Lead Connection Status: 753985
Implantable Lead Connection Status: 753985
Implantable Lead Connection Status: 753985
Implantable Lead Implant Date: 20160310
Implantable Lead Implant Date: 20160310
Implantable Lead Implant Date: 20160310
Implantable Lead Location: 753858
Implantable Lead Location: 753859
Implantable Lead Location: 753860
Implantable Lead Model: 1948
Implantable Pulse Generator Implant Date: 20231129
Lead Channel Impedance Value: 390 Ohm
Lead Channel Impedance Value: 490 Ohm
Lead Channel Impedance Value: 700 Ohm
Lead Channel Pacing Threshold Amplitude: 0.5 V
Lead Channel Pacing Threshold Amplitude: 0.75 V
Lead Channel Pacing Threshold Amplitude: 1 V
Lead Channel Pacing Threshold Pulse Width: 0.4 ms
Lead Channel Pacing Threshold Pulse Width: 0.4 ms
Lead Channel Pacing Threshold Pulse Width: 0.4 ms
Lead Channel Sensing Intrinsic Amplitude: 12 mV
Lead Channel Sensing Intrinsic Amplitude: 2.3 mV
Lead Channel Setting Pacing Amplitude: 2 V
Lead Channel Setting Pacing Amplitude: 2.5 V
Lead Channel Setting Pacing Amplitude: 2.5 V
Lead Channel Setting Pacing Pulse Width: 0.4 ms
Lead Channel Setting Pacing Pulse Width: 0.4 ms
Lead Channel Setting Sensing Sensitivity: 4 mV
Pulse Gen Model: 3562
Pulse Gen Serial Number: 8124783

## 2023-09-13 ENCOUNTER — Ambulatory Visit: Payer: Self-pay | Admitting: Cardiovascular Disease

## 2023-09-17 ENCOUNTER — Ambulatory Visit: Attending: Cardiovascular Disease

## 2023-09-17 DIAGNOSIS — Z95810 Presence of automatic (implantable) cardiac defibrillator: Secondary | ICD-10-CM

## 2023-09-17 DIAGNOSIS — I5042 Chronic combined systolic (congestive) and diastolic (congestive) heart failure: Secondary | ICD-10-CM | POA: Diagnosis not present

## 2023-09-18 NOTE — Progress Notes (Signed)
 EPIC Encounter for ICM Monitoring  Patient Name: Phillip Booth is a 73 y.o. male Date: 09/18/2023 Primary Care Physican: Zena Frederick, DO Primary Cardiologist: Jeffrie Electrophysiologist: Mealor Bi-V Pacing:  >99%      03/13/2022 Weight: 219 lbs 11/22/2022 Weight:  221 lbs 03/13/2023 Weight: unknown 03/21/2023 Weight: 219 lbs 06/29/2023 Weight: 212-219 lbs 09/18/2023 Weight: 212 lbs   AT/AF Burden: <1%    Spoke with patient and heart failure questions reviewed.  Transmission results reviewed.  Pt asymptomatic for fluid accumulation.  Reports feeling well at this time and voices no complaints.     Corvue thoracic impedance suggesting normal fluid levels within the last month.     Prescribed: Furosemide  20 mg 1 tablet daily   Labs: 06/24/2023 Creatinine 0.76, BUN 11, Potassium 3.8, Sodium 137, GFR >60 06/22/2023 Creatinine 0.84, BUN 19, Potassium 4.0, Sodium 141, GFR >60 05/03/2023 Creatinine 0.96, BUN 19, Potassium 4.0, Sodium 139, GFR >60, BNP 115.4 A complete set of results can be found in Results Review.   Recommendations:  No changes and encouraged to call if experiencing any fluid symptoms.   Follow-up plan: ICM clinic phone appointment on 10/29/2023.   91 day device clinic remote transmission 12/04/2023.     EP/Cardiology Office Visits:   Missed 08/13/2023 with Olivia Pavy, PA and advised to call to reschedule.  Recall 08/02/2024 with Dr Nancey.    Copy of ICM check sent to Dr. Nancey.   3 month ICM trend: 09/17/2023.    12-14 Month ICM trend:     Mitzie GORMAN Garner, RN 09/18/2023 3:12 PM

## 2023-09-25 NOTE — Progress Notes (Signed)
 Remote PPM Transmission

## 2023-10-29 ENCOUNTER — Ambulatory Visit: Attending: Cardiovascular Disease

## 2023-10-29 DIAGNOSIS — Z95810 Presence of automatic (implantable) cardiac defibrillator: Secondary | ICD-10-CM | POA: Diagnosis not present

## 2023-10-29 DIAGNOSIS — I5042 Chronic combined systolic (congestive) and diastolic (congestive) heart failure: Secondary | ICD-10-CM

## 2023-10-31 NOTE — Progress Notes (Signed)
 EPIC Encounter for ICM Monitoring  Patient Name: Phillip Booth is a 73 y.o. male Date: 10/31/2023 Primary Care Physican: Zena Frederick, DO Primary Cardiologist: Jeffrie Electrophysiologist: Mealor Bi-V Pacing:  >99%      03/13/2022 Weight: 219 lbs 11/22/2022 Weight:  221 lbs 03/13/2023 Weight: unknown 03/21/2023 Weight: 219 lbs 06/29/2023 Weight: 212-219 lbs 09/18/2023 Weight: 212 lbs   AT/AF Burden: <1%    Spoke with patient and heart failure questions reviewed.  Transmission results reviewed.  Pt asymptomatic for fluid accumulation.  Reports feeling well at this time and voices no complaints.      Since 09/17/2023 ICM Remote Transmission: Corvue thoracic impedance suggesting normal fluid levels.     Prescribed:  Furosemide  20 mg 1 tablet daily (discontinued 06/22/2023)   Labs: 06/24/2023 Creatinine 0.76, BUN 11, Potassium 3.8, Sodium 137, GFR >60 06/22/2023 Creatinine 0.84, BUN 19, Potassium 4.0, Sodium 141, GFR >60 05/03/2023 Creatinine 0.96, BUN 19, Potassium 4.0, Sodium 139, GFR >60, BNP 115.4 A complete set of results can be found in Results Review.   Recommendations:  No changes and encouraged to call if experiencing any fluid symptoms.   Follow-up plan: ICM clinic phone appointment on 11/29/2023.   91 day device clinic remote transmission 12/04/2023.     EP/Cardiology Office Visits:   11/29/2023 with Dr Jeffrie.  Recall 08/02/2024 with Dr Nancey.    Copy of ICM check sent to Dr. Nancey.   Remote monitoring is medically necessary for Heart Failure Management.    Daily Thoracic Impedance ICM trend: 07/31/2023 through 10/29/2023.    12-14 Month Thoracic Impedance ICM trend:     Mitzie GORMAN Garner, RN 10/31/2023 8:53 AM

## 2023-11-29 ENCOUNTER — Encounter: Payer: Self-pay | Admitting: Cardiology

## 2023-11-29 ENCOUNTER — Ambulatory Visit

## 2023-11-29 ENCOUNTER — Ambulatory Visit: Attending: Cardiology | Admitting: Cardiology

## 2023-11-29 ENCOUNTER — Telehealth: Payer: Self-pay

## 2023-11-29 VITALS — BP 136/77 | HR 85 | Ht 76.0 in | Wt 210.0 lb

## 2023-11-29 DIAGNOSIS — R001 Bradycardia, unspecified: Secondary | ICD-10-CM | POA: Diagnosis not present

## 2023-11-29 DIAGNOSIS — I5042 Chronic combined systolic (congestive) and diastolic (congestive) heart failure: Secondary | ICD-10-CM | POA: Diagnosis not present

## 2023-11-29 DIAGNOSIS — Z95810 Presence of automatic (implantable) cardiac defibrillator: Secondary | ICD-10-CM | POA: Diagnosis not present

## 2023-11-29 NOTE — Patient Instructions (Signed)

## 2023-11-29 NOTE — Progress Notes (Signed)
 No ICM remote transmission received for 11/29/2023 and next ICM transmission scheduled for 12/05/2023.

## 2023-11-29 NOTE — Progress Notes (Signed)
  Cardiology Office Note:  .   Date:  11/29/2023  ID:  Phillip Booth, DOB 08/17/1950, MRN [id number] PCP: Zena Frederick, DO  [facility] Providers Cardiologist:  Oneil Parchment, MD Cardiology APP:  Kathrine Jeoffrey POUR, NP (Inactive)  Electrophysiologist:  Eulas FORBES Furbish, MD     History of Present Illness: .   Phillip Booth is a 73 y.o. male Discussed the use of AI scribe  History of Present Illness Phillip Booth is a 73 year old male with coronary disease, non-ischemic cardiomyopathy, and paroxysmal atrial fibrillation who presents for evaluation of his heart condition.  He has a history of coronary disease, non-ischemic cardiomyopathy, and paroxysmal atrial fibrillation. An Abbott biventricular pacemaker was placed due to symptomatic bradycardia from sinus node dysfunction and AV block, with the generator changed in November 2023. His ejection fraction in 2022 was 45-50%.  He is currently on lisinopril  40 mg and carvedilol  6.25 mg for his heart conditions. Paroxysmal atrial fibrillation was noted on device interrogation, and he continues to take Eliquis .  He experiences periods of low mood and worries about his heart, mentioning feelings of congestion around his heart area. He attributes some of these feelings to stress and lack of sleep, as he has not been sleeping well due to personal stressors.  He has been dealing with significant personal stress, including the recent loss of his wife, who was misdiagnosed he states with Parkinson's disease and later found to have a more severe condition affecting her nervous system. This has impacted his emotional well-being and sleep patterns.     Studies Reviewed: .        Results DIAGNOSTIC EF: 45-50% (2022) Risk Assessment/Calculations:            Physical Exam:   VS:  BP 136/77   Pulse 85   Ht 6' 4 (1.93 m)   Wt 210 lb (95.3 kg)   SpO2 98%   BMI 25.56 kg/m    Wt Readings from Last 3 Encounters:  11/29/23 210 lb (95.3 kg)   08/08/23 201 lb 3.2 oz (91.3 kg)  06/22/23 230 lb (104.3 kg)    GEN: Well nourished, well developed in no acute distress NECK: No JVD; No carotid bruits CARDIAC: RRR, no murmurs, no rubs, no gallops RESPIRATORY:  Clear to auscultation without rales, wheezing or rhonchi  ABDOMEN: Soft, non-tender, non-distended EXTREMITIES:  No edema; No deformity   ASSESSMENT AND PLAN: .    Assessment and Plan Assessment & Plan Coronary artery disease and non-ischemic cardiomyopathy with biventricular pacemaker (status post generator change) Coronary artery disease with non-ischemic cardiomyopathy. Biventricular pacemaker in place with generator change in November 2022. Ejection fraction improved to 45-50% in 2022. Pacemaker is functioning well, providing protection against bradycardia and AV block. - Continue lisinopril  40 mg daily - Continue carvedilol  6.25 mg daily  Paroxysmal atrial fibrillation on anticoagulation Paroxysmal atrial fibrillation noted on device interrogation. Continues on Eliquis  for anticoagulation. - Continue Eliquis   Congestive symptoms (mild heart failure symptoms) Intermittent mild congestive symptoms, possibly related to pacemaker or heart failure. Symptoms are manageable and not severe.         Dispo: 1 yr  Signed, Oneil Parchment, MD

## 2023-11-29 NOTE — Telephone Encounter (Signed)
 ICM Call to patient.  Advised automatic report was not received today and requested to send manual transmission today.   He said he was in the process of moving and did not have his monitor plugged in.  Reminded of 3:00 appointment today with Dr Jeffrie and requested to send manual transmission prior to appointment if possible.

## 2023-12-04 ENCOUNTER — Ambulatory Visit (INDEPENDENT_AMBULATORY_CARE_PROVIDER_SITE_OTHER): Payer: Medicare Other

## 2023-12-04 DIAGNOSIS — R001 Bradycardia, unspecified: Secondary | ICD-10-CM | POA: Diagnosis not present

## 2023-12-04 LAB — CUP PACEART REMOTE DEVICE CHECK
Battery Remaining Longevity: 69 mo
Battery Remaining Percentage: 76 %
Battery Voltage: 3.01 V
Brady Statistic AP VP Percent: 99 %
Brady Statistic AP VS Percent: 1 %
Brady Statistic AS VP Percent: 1 %
Brady Statistic AS VS Percent: 1 %
Brady Statistic RA Percent Paced: 99 %
Date Time Interrogation Session: 20251125023904
Implantable Lead Connection Status: 753985
Implantable Lead Connection Status: 753985
Implantable Lead Connection Status: 753985
Implantable Lead Implant Date: 20160310
Implantable Lead Implant Date: 20160310
Implantable Lead Implant Date: 20160310
Implantable Lead Location: 753858
Implantable Lead Location: 753859
Implantable Lead Location: 753860
Implantable Lead Model: 1948
Implantable Pulse Generator Implant Date: 20231129
Lead Channel Impedance Value: 360 Ohm
Lead Channel Impedance Value: 530 Ohm
Lead Channel Impedance Value: 700 Ohm
Lead Channel Pacing Threshold Amplitude: 0.5 V
Lead Channel Pacing Threshold Amplitude: 0.75 V
Lead Channel Pacing Threshold Amplitude: 1 V
Lead Channel Pacing Threshold Pulse Width: 0.4 ms
Lead Channel Pacing Threshold Pulse Width: 0.4 ms
Lead Channel Pacing Threshold Pulse Width: 0.4 ms
Lead Channel Sensing Intrinsic Amplitude: 12 mV
Lead Channel Sensing Intrinsic Amplitude: 2.4 mV
Lead Channel Setting Pacing Amplitude: 2 V
Lead Channel Setting Pacing Amplitude: 2.5 V
Lead Channel Setting Pacing Amplitude: 2.5 V
Lead Channel Setting Pacing Pulse Width: 0.4 ms
Lead Channel Setting Pacing Pulse Width: 0.4 ms
Lead Channel Setting Sensing Sensitivity: 4 mV
Pulse Gen Model: 3562
Pulse Gen Serial Number: 8124783

## 2023-12-05 ENCOUNTER — Ambulatory Visit: Attending: Cardiovascular Disease

## 2023-12-05 DIAGNOSIS — Z95810 Presence of automatic (implantable) cardiac defibrillator: Secondary | ICD-10-CM | POA: Diagnosis not present

## 2023-12-05 DIAGNOSIS — I5042 Chronic combined systolic (congestive) and diastolic (congestive) heart failure: Secondary | ICD-10-CM | POA: Diagnosis not present

## 2023-12-05 NOTE — Progress Notes (Signed)
 Remote PPM Transmission

## 2023-12-05 NOTE — Progress Notes (Signed)
 EPIC Encounter for ICM Monitoring  Patient Name: Phillip Booth is a 73 y.o. male Date: 12/05/2023 Primary Care Physican: Zena Frederick, DO Primary Cardiologist: Jeffrie Electrophysiologist: Mealor Bi-V Pacing:  >99%      03/13/2022 Weight: 219 lbs 11/22/2022 Weight:  221 lbs 03/13/2023 Weight: unknown 03/21/2023 Weight: 219 lbs 06/29/2023 Weight: 212-219 lbs 09/18/2023 Weight: 212 lbs 11/29/2023 Office Weight: 210 lbs   AT/AF Burden: <1%    Spoke with patient and heart failure questions reviewed.  Transmission results reviewed.  Pt asymptomatic for fluid accumulation.  Reports feeling well at this time and voices no complaints.     Since 10/29/2023 ICM Remote Transmission: [facility] thoracic impedance suggesting normal fluid levels with the exception of possible fluid accumulation 11/06/2023-11/17/2023.     Prescribed:  Furosemide  20 mg 1 tablet daily (discontinued 06/22/2023)   Labs: 06/24/2023 Creatinine 0.76, BUN 11, Potassium 3.8, Sodium 137, GFR >60 06/22/2023 Creatinine 0.84, BUN 19, Potassium 4.0, Sodium 141, GFR >60 05/03/2023 Creatinine 0.96, BUN 19, Potassium 4.0, Sodium 139, GFR >60, BNP 115.4 A complete set of results can be found in Results Review.   Recommendations:  No changes and encouraged to call if experiencing any fluid symptoms.   Follow-up plan: ICM clinic phone appointment on 01/14/2024.   91 day device clinic remote transmission 03/04/2024.     EP/Cardiology Office Visits:   Recall 11/24/2024 with Dr Jeffrie.  Recall 08/02/2024 with Dr Nancey.    Copy of ICM check sent to Dr. Nancey.   Remote monitoring is medically necessary for Heart Failure Management.    Daily Thoracic Impedance ICM trend: 09/05/2023 through 12/04/2023.    12-14 Month Thoracic Impedance ICM trend:     Mitzie GORMAN Garner, RN 12/05/2023 8:05 AM

## 2023-12-11 ENCOUNTER — Ambulatory Visit: Payer: Self-pay | Admitting: Cardiovascular Disease

## 2023-12-24 ENCOUNTER — Ambulatory Visit

## 2024-01-14 ENCOUNTER — Ambulatory Visit

## 2024-01-14 DIAGNOSIS — Z95810 Presence of automatic (implantable) cardiac defibrillator: Secondary | ICD-10-CM | POA: Diagnosis not present

## 2024-01-14 DIAGNOSIS — I5042 Chronic combined systolic (congestive) and diastolic (congestive) heart failure: Secondary | ICD-10-CM

## 2024-01-15 NOTE — Progress Notes (Unsigned)
 EPIC Encounter for ICM Monitoring  Patient Name: Phillip Booth is a 74 y.o. male Date: 01/15/2024 Primary Care Physican: Zena Frederick, DO Primary Cardiologist: Jeffrie Electrophysiologist: Mealor Bi-V Pacing:  >99%      03/13/2022 Weight: 219 lbs 11/22/2022 Weight:  221 lbs 03/13/2023 Weight: unknown 03/21/2023 Weight: 219 lbs 06/29/2023 Weight: 212-219 lbs 09/18/2023 Weight: 212 lbs 11/29/2023 Office Weight: 210 lbs 01/16/2024 Weight: 212 lbs   AT/AF Burden: 2.5% (was <1% on 11/29/2023 report)   Spoke with patient and heart failure questions reviewed.  Transmission results reviewed.  Pt asymptomatic for fluid accumulation.   Discussed if he had any symptoms on December 10th and recalls having some epigastric pain in December but nothing with heart palpitations or breathing difficulty.  He reports he may have missed some of his meds during that time.     Since 12/05/2023 ICM Remote Transmission: [facility] thoracic impedance suggesting normal fluid levels.    Message sent to device clinic triage to review increased AT/AF.     Prescribed:  Furosemide  20 mg 1 tablet daily (discontinued 06/22/2023)   Labs: 06/24/2023 Creatinine 0.76, BUN 11, Potassium 3.8, Sodium 137, GFR >60 06/22/2023 Creatinine 0.84, BUN 19, Potassium 4.0, Sodium 141, GFR >60 05/03/2023 Creatinine 0.96, BUN 19, Potassium 4.0, Sodium 139, GFR >60, BNP 115.4 A complete set of results can be found in Results Review.   Recommendations:   Explained importance of medication compliance to decrease risk of stroke and hospitalization. No changes and encouraged to call if experiencing any fluid symptoms.     Follow-up plan: ICM clinic phone appointment on 02/14/2024.   91 day device clinic remote transmission 03/04/2024.     EP/Cardiology Office Visits:   Recall 11/24/2024 with Dr Jeffrie.  Recall 08/02/2024 with Dr Nancey.    Copy of ICM check sent to Dr. Nancey.   Remote monitoring is medically necessary for Heart Failure Management.     Daily Thoracic Impedance ICM trend: 10/16/2023 through 01/14/2024.    12-14 Month Thoracic Impedance ICM trend:     Mitzie GORMAN Garner, RN 01/15/2024 5:15 PM

## 2024-01-16 NOTE — Progress Notes (Signed)
 Claudene Delon LABOR, RN  Amal Renbarger, Mitzie RAMAN, RN We actually got an alert on that on December 8.  We were going to recheck in 7 days to make sure it didn't last that long.  The repeat check didn't work out, but it's ok since it only last for 3 days.  Nothing different to do.  :D  Thank you for reaching out! Randall

## 2024-02-07 NOTE — Progress Notes (Signed)
 31 day ICM Remote transmission canceled due to [facility] clinic is on hold until further notice.  91 day remote monitoring will continue per protocol.

## 2024-02-08 ENCOUNTER — Telehealth: Payer: Self-pay

## 2024-02-08 DIAGNOSIS — I5042 Chronic combined systolic (congestive) and diastolic (congestive) heart failure: Secondary | ICD-10-CM

## 2024-02-08 NOTE — Telephone Encounter (Signed)
 Patient returned RN's call.

## 2024-02-08 NOTE — Telephone Encounter (Signed)
 Patient reports that he overall has been working harder trying to make money and is more fatigued, not sleeping as much. Can sometimes tell when his hear is out of rhythm due to feeling heart race. Denies SOB, chest pain, dizziness, or lightheadedness.   Reports some intermittent chest tightness mostly after meals but attributes to gas and this has been going on for several months. He is not interested in seeing afib clinic at this time and will call us  back if he has any symptoms related to his Afib.    He is having trouble affording his Eliquis  and specifically asks for social worker to call him to discuss in general supports/resources for his care and financial needs. ([place] now [place] and cannot travel back home due to poor health and he has not been able to see her for 6 months).  He would also like to talk to the pharmacist regarding affordability options related to Eliquis .   He says he is taking all of his medications without missed doses.  Other than being under substantial caregiver and financial stress over the past year with his wife's failing health, no other changes to diet, health or medications.   Will continue to monitor AF.  Forwarding as FYI to Dr. Nancey.  Sending to pharmacy and social work team as patient has requested outreach.

## 2024-02-08 NOTE — Telephone Encounter (Addendum)
 PPM ALERT: AF BURDEN  Alert remote transmission: Exceed AT/AF Persistent AF/AFL overall good rate control, Eliquis  per EPIC - route to triage Suspend burden alert until f/u, plan established  Spoke with patient briefly, states he can tell when his heart is out of rhythm and he has been working very hard and not resting recently.   Patient states he cannot talk more at the moment because he is at work and busy.  He requests I call back and LM on his VM with my number so he can call back when he has more time to talk about how he has been doing.   LMOVM with device number per patient's request.

## 2024-02-11 ENCOUNTER — Telehealth: Payer: Self-pay | Admitting: Pharmacy Technician

## 2024-02-11 ENCOUNTER — Telehealth: Payer: Self-pay | Admitting: Licensed Clinical Social Worker

## 2024-02-11 ENCOUNTER — Other Ambulatory Visit (HOSPITAL_COMMUNITY): Payer: Self-pay

## 2024-02-11 NOTE — Telephone Encounter (Signed)
 1 month supply of Eliquis  is 249 dollars due to his deductible

## 2024-02-11 NOTE — Telephone Encounter (Signed)
" ° ° °  Ran test claim for eliquis . For a 30 day supply and the co-pay is 249.45 . Says deductible but doesn't say how much left on deductible  "

## 2024-02-11 NOTE — Telephone Encounter (Signed)
 Please verify cost of Eliquis 

## 2024-02-14 ENCOUNTER — Ambulatory Visit

## 2024-03-04 ENCOUNTER — Ambulatory Visit: Payer: Medicare Other
